# Patient Record
Sex: Female | Born: 1945
Health system: Southern US, Community
[De-identification: ages and names within clinical notes are randomized; demographics above are authoritative.]

## PROBLEM LIST (undated history)

## (undated) DIAGNOSIS — N186 End stage renal disease: Secondary | ICD-10-CM

## (undated) DIAGNOSIS — I10 Essential (primary) hypertension: Secondary | ICD-10-CM

## (undated) DIAGNOSIS — G5602 Carpal tunnel syndrome, left upper limb: Secondary | ICD-10-CM

## (undated) DIAGNOSIS — I2510 Atherosclerotic heart disease of native coronary artery without angina pectoris: Secondary | ICD-10-CM

## (undated) DIAGNOSIS — I5189 Other ill-defined heart diseases: Secondary | ICD-10-CM

## (undated) DIAGNOSIS — J189 Pneumonia, unspecified organism: Secondary | ICD-10-CM

## (undated) DIAGNOSIS — I4510 Unspecified right bundle-branch block: Secondary | ICD-10-CM

## (undated) DIAGNOSIS — H269 Unspecified cataract: Secondary | ICD-10-CM

## (undated) DIAGNOSIS — D649 Anemia, unspecified: Secondary | ICD-10-CM

## (undated) DIAGNOSIS — I272 Pulmonary hypertension, unspecified: Secondary | ICD-10-CM

## (undated) DIAGNOSIS — Z992 Dependence on renal dialysis: Secondary | ICD-10-CM

## (undated) DIAGNOSIS — G629 Polyneuropathy, unspecified: Secondary | ICD-10-CM

## (undated) DIAGNOSIS — R053 Chronic cough: Secondary | ICD-10-CM

## (undated) DIAGNOSIS — R05 Cough: Secondary | ICD-10-CM

## (undated) DIAGNOSIS — E119 Type 2 diabetes mellitus without complications: Secondary | ICD-10-CM

## (undated) DIAGNOSIS — E785 Hyperlipidemia, unspecified: Secondary | ICD-10-CM

## (undated) DIAGNOSIS — I739 Peripheral vascular disease, unspecified: Secondary | ICD-10-CM

## (undated) DIAGNOSIS — R011 Cardiac murmur, unspecified: Secondary | ICD-10-CM

## (undated) DIAGNOSIS — K219 Gastro-esophageal reflux disease without esophagitis: Secondary | ICD-10-CM

## (undated) DIAGNOSIS — H409 Unspecified glaucoma: Secondary | ICD-10-CM

## (undated) DIAGNOSIS — N2581 Secondary hyperparathyroidism of renal origin: Secondary | ICD-10-CM

## (undated) DIAGNOSIS — M1990 Unspecified osteoarthritis, unspecified site: Secondary | ICD-10-CM

## (undated) DIAGNOSIS — Z972 Presence of dental prosthetic device (complete) (partial): Secondary | ICD-10-CM

## (undated) DIAGNOSIS — N189 Chronic kidney disease, unspecified: Secondary | ICD-10-CM

## (undated) DIAGNOSIS — I440 Atrioventricular block, first degree: Secondary | ICD-10-CM

## (undated) HISTORY — PX: OTHER SURGICAL HISTORY: SHX169

## (undated) HISTORY — PX: CORONARY ARTERY BYPASS GRAFT: SHX141

## (undated) HISTORY — PX: ABDOMINAL HYSTERECTOMY: SHX81

## (undated) HISTORY — PX: NEPHRECTOMY: SHX65

## (undated) HISTORY — PX: COLONOSCOPY: SHX174

---

## 2001-04-01 DIAGNOSIS — N186 End stage renal disease: Secondary | ICD-10-CM | POA: Insufficient documentation

## 2001-04-01 DIAGNOSIS — E119 Type 2 diabetes mellitus without complications: Secondary | ICD-10-CM | POA: Insufficient documentation

## 2004-09-19 ENCOUNTER — Ambulatory Visit: Payer: Self-pay | Admitting: Nephrology

## 2004-09-25 ENCOUNTER — Ambulatory Visit: Payer: Self-pay | Admitting: Nephrology

## 2005-09-03 ENCOUNTER — Ambulatory Visit: Payer: Self-pay | Admitting: Nephrology

## 2006-05-06 HISTORY — PX: CORONARY ARTERY BYPASS GRAFT: SHX141

## 2006-08-17 ENCOUNTER — Emergency Department: Payer: Self-pay | Admitting: Emergency Medicine

## 2006-08-17 ENCOUNTER — Other Ambulatory Visit: Payer: Self-pay

## 2006-11-03 ENCOUNTER — Ambulatory Visit: Payer: Self-pay

## 2007-05-27 ENCOUNTER — Ambulatory Visit: Payer: Self-pay | Admitting: Nephrology

## 2008-01-07 ENCOUNTER — Ambulatory Visit: Payer: Self-pay | Admitting: Nephrology

## 2008-01-23 ENCOUNTER — Emergency Department: Payer: Self-pay | Admitting: Emergency Medicine

## 2008-01-28 ENCOUNTER — Emergency Department: Payer: Self-pay | Admitting: Emergency Medicine

## 2008-07-25 ENCOUNTER — Emergency Department: Payer: Self-pay | Admitting: Emergency Medicine

## 2008-07-27 ENCOUNTER — Ambulatory Visit: Payer: Self-pay | Admitting: Internal Medicine

## 2008-10-08 ENCOUNTER — Ambulatory Visit: Payer: Self-pay

## 2008-12-22 ENCOUNTER — Ambulatory Visit: Payer: Self-pay | Admitting: Nephrology

## 2009-08-01 ENCOUNTER — Ambulatory Visit: Payer: Self-pay | Admitting: Nephrology

## 2009-09-18 ENCOUNTER — Ambulatory Visit: Payer: Self-pay | Admitting: Unknown Physician Specialty

## 2010-01-05 ENCOUNTER — Emergency Department: Payer: Self-pay | Admitting: Emergency Medicine

## 2010-03-26 ENCOUNTER — Encounter: Payer: Self-pay | Admitting: Orthopedic Surgery

## 2010-04-05 ENCOUNTER — Encounter: Payer: Self-pay | Admitting: Orthopedic Surgery

## 2010-08-13 ENCOUNTER — Ambulatory Visit: Payer: Self-pay

## 2011-05-09 DIAGNOSIS — E119 Type 2 diabetes mellitus without complications: Secondary | ICD-10-CM | POA: Diagnosis not present

## 2011-05-09 DIAGNOSIS — D631 Anemia in chronic kidney disease: Secondary | ICD-10-CM | POA: Diagnosis not present

## 2011-05-09 DIAGNOSIS — N039 Chronic nephritic syndrome with unspecified morphologic changes: Secondary | ICD-10-CM | POA: Diagnosis not present

## 2011-05-09 DIAGNOSIS — Z992 Dependence on renal dialysis: Secondary | ICD-10-CM | POA: Diagnosis not present

## 2011-05-09 DIAGNOSIS — N186 End stage renal disease: Secondary | ICD-10-CM | POA: Diagnosis not present

## 2011-05-09 DIAGNOSIS — N2581 Secondary hyperparathyroidism of renal origin: Secondary | ICD-10-CM | POA: Diagnosis not present

## 2011-05-11 DIAGNOSIS — D631 Anemia in chronic kidney disease: Secondary | ICD-10-CM | POA: Diagnosis not present

## 2011-05-11 DIAGNOSIS — E119 Type 2 diabetes mellitus without complications: Secondary | ICD-10-CM | POA: Diagnosis not present

## 2011-05-11 DIAGNOSIS — Z992 Dependence on renal dialysis: Secondary | ICD-10-CM | POA: Diagnosis not present

## 2011-05-11 DIAGNOSIS — N186 End stage renal disease: Secondary | ICD-10-CM | POA: Diagnosis not present

## 2011-05-11 DIAGNOSIS — N2581 Secondary hyperparathyroidism of renal origin: Secondary | ICD-10-CM | POA: Diagnosis not present

## 2011-05-14 DIAGNOSIS — N039 Chronic nephritic syndrome with unspecified morphologic changes: Secondary | ICD-10-CM | POA: Diagnosis not present

## 2011-05-14 DIAGNOSIS — Z992 Dependence on renal dialysis: Secondary | ICD-10-CM | POA: Diagnosis not present

## 2011-05-14 DIAGNOSIS — N186 End stage renal disease: Secondary | ICD-10-CM | POA: Diagnosis not present

## 2011-05-14 DIAGNOSIS — N2581 Secondary hyperparathyroidism of renal origin: Secondary | ICD-10-CM | POA: Diagnosis not present

## 2011-05-14 DIAGNOSIS — E119 Type 2 diabetes mellitus without complications: Secondary | ICD-10-CM | POA: Diagnosis not present

## 2011-05-16 DIAGNOSIS — N2581 Secondary hyperparathyroidism of renal origin: Secondary | ICD-10-CM | POA: Diagnosis not present

## 2011-05-16 DIAGNOSIS — D631 Anemia in chronic kidney disease: Secondary | ICD-10-CM | POA: Diagnosis not present

## 2011-05-16 DIAGNOSIS — E119 Type 2 diabetes mellitus without complications: Secondary | ICD-10-CM | POA: Diagnosis not present

## 2011-05-16 DIAGNOSIS — Z992 Dependence on renal dialysis: Secondary | ICD-10-CM | POA: Diagnosis not present

## 2011-05-16 DIAGNOSIS — N186 End stage renal disease: Secondary | ICD-10-CM | POA: Diagnosis not present

## 2011-05-18 DIAGNOSIS — Z992 Dependence on renal dialysis: Secondary | ICD-10-CM | POA: Diagnosis not present

## 2011-05-18 DIAGNOSIS — N2581 Secondary hyperparathyroidism of renal origin: Secondary | ICD-10-CM | POA: Diagnosis not present

## 2011-05-18 DIAGNOSIS — D631 Anemia in chronic kidney disease: Secondary | ICD-10-CM | POA: Diagnosis not present

## 2011-05-18 DIAGNOSIS — N039 Chronic nephritic syndrome with unspecified morphologic changes: Secondary | ICD-10-CM | POA: Diagnosis not present

## 2011-05-18 DIAGNOSIS — N186 End stage renal disease: Secondary | ICD-10-CM | POA: Diagnosis not present

## 2011-05-18 DIAGNOSIS — E119 Type 2 diabetes mellitus without complications: Secondary | ICD-10-CM | POA: Diagnosis not present

## 2011-05-21 DIAGNOSIS — Z992 Dependence on renal dialysis: Secondary | ICD-10-CM | POA: Diagnosis not present

## 2011-05-21 DIAGNOSIS — E119 Type 2 diabetes mellitus without complications: Secondary | ICD-10-CM | POA: Diagnosis not present

## 2011-05-21 DIAGNOSIS — D631 Anemia in chronic kidney disease: Secondary | ICD-10-CM | POA: Diagnosis not present

## 2011-05-21 DIAGNOSIS — N2581 Secondary hyperparathyroidism of renal origin: Secondary | ICD-10-CM | POA: Diagnosis not present

## 2011-05-21 DIAGNOSIS — N186 End stage renal disease: Secondary | ICD-10-CM | POA: Diagnosis not present

## 2011-05-23 DIAGNOSIS — N186 End stage renal disease: Secondary | ICD-10-CM | POA: Diagnosis not present

## 2011-05-23 DIAGNOSIS — N2581 Secondary hyperparathyroidism of renal origin: Secondary | ICD-10-CM | POA: Diagnosis not present

## 2011-05-23 DIAGNOSIS — Z992 Dependence on renal dialysis: Secondary | ICD-10-CM | POA: Diagnosis not present

## 2011-05-23 DIAGNOSIS — E119 Type 2 diabetes mellitus without complications: Secondary | ICD-10-CM | POA: Diagnosis not present

## 2011-05-23 DIAGNOSIS — D631 Anemia in chronic kidney disease: Secondary | ICD-10-CM | POA: Diagnosis not present

## 2011-05-25 DIAGNOSIS — E119 Type 2 diabetes mellitus without complications: Secondary | ICD-10-CM | POA: Diagnosis not present

## 2011-05-25 DIAGNOSIS — D631 Anemia in chronic kidney disease: Secondary | ICD-10-CM | POA: Diagnosis not present

## 2011-05-25 DIAGNOSIS — N186 End stage renal disease: Secondary | ICD-10-CM | POA: Diagnosis not present

## 2011-05-25 DIAGNOSIS — N2581 Secondary hyperparathyroidism of renal origin: Secondary | ICD-10-CM | POA: Diagnosis not present

## 2011-05-25 DIAGNOSIS — Z992 Dependence on renal dialysis: Secondary | ICD-10-CM | POA: Diagnosis not present

## 2011-05-28 DIAGNOSIS — N039 Chronic nephritic syndrome with unspecified morphologic changes: Secondary | ICD-10-CM | POA: Diagnosis not present

## 2011-05-28 DIAGNOSIS — Z992 Dependence on renal dialysis: Secondary | ICD-10-CM | POA: Diagnosis not present

## 2011-05-28 DIAGNOSIS — E119 Type 2 diabetes mellitus without complications: Secondary | ICD-10-CM | POA: Diagnosis not present

## 2011-05-28 DIAGNOSIS — D631 Anemia in chronic kidney disease: Secondary | ICD-10-CM | POA: Diagnosis not present

## 2011-05-28 DIAGNOSIS — N186 End stage renal disease: Secondary | ICD-10-CM | POA: Diagnosis not present

## 2011-05-28 DIAGNOSIS — N2581 Secondary hyperparathyroidism of renal origin: Secondary | ICD-10-CM | POA: Diagnosis not present

## 2011-05-30 DIAGNOSIS — N2581 Secondary hyperparathyroidism of renal origin: Secondary | ICD-10-CM | POA: Diagnosis not present

## 2011-05-30 DIAGNOSIS — N186 End stage renal disease: Secondary | ICD-10-CM | POA: Diagnosis not present

## 2011-05-30 DIAGNOSIS — Z992 Dependence on renal dialysis: Secondary | ICD-10-CM | POA: Diagnosis not present

## 2011-05-30 DIAGNOSIS — E119 Type 2 diabetes mellitus without complications: Secondary | ICD-10-CM | POA: Diagnosis not present

## 2011-05-30 DIAGNOSIS — N039 Chronic nephritic syndrome with unspecified morphologic changes: Secondary | ICD-10-CM | POA: Diagnosis not present

## 2011-06-01 DIAGNOSIS — N2581 Secondary hyperparathyroidism of renal origin: Secondary | ICD-10-CM | POA: Diagnosis not present

## 2011-06-01 DIAGNOSIS — E119 Type 2 diabetes mellitus without complications: Secondary | ICD-10-CM | POA: Diagnosis not present

## 2011-06-01 DIAGNOSIS — N039 Chronic nephritic syndrome with unspecified morphologic changes: Secondary | ICD-10-CM | POA: Diagnosis not present

## 2011-06-01 DIAGNOSIS — N186 End stage renal disease: Secondary | ICD-10-CM | POA: Diagnosis not present

## 2011-06-01 DIAGNOSIS — Z992 Dependence on renal dialysis: Secondary | ICD-10-CM | POA: Diagnosis not present

## 2011-06-04 DIAGNOSIS — N2581 Secondary hyperparathyroidism of renal origin: Secondary | ICD-10-CM | POA: Diagnosis not present

## 2011-06-04 DIAGNOSIS — N186 End stage renal disease: Secondary | ICD-10-CM | POA: Diagnosis not present

## 2011-06-04 DIAGNOSIS — E119 Type 2 diabetes mellitus without complications: Secondary | ICD-10-CM | POA: Diagnosis not present

## 2011-06-04 DIAGNOSIS — Z992 Dependence on renal dialysis: Secondary | ICD-10-CM | POA: Diagnosis not present

## 2011-06-04 DIAGNOSIS — N039 Chronic nephritic syndrome with unspecified morphologic changes: Secondary | ICD-10-CM | POA: Diagnosis not present

## 2011-06-06 DIAGNOSIS — E119 Type 2 diabetes mellitus without complications: Secondary | ICD-10-CM | POA: Diagnosis not present

## 2011-06-06 DIAGNOSIS — N186 End stage renal disease: Secondary | ICD-10-CM | POA: Diagnosis not present

## 2011-06-06 DIAGNOSIS — D631 Anemia in chronic kidney disease: Secondary | ICD-10-CM | POA: Diagnosis not present

## 2011-06-06 DIAGNOSIS — Z992 Dependence on renal dialysis: Secondary | ICD-10-CM | POA: Diagnosis not present

## 2011-06-06 DIAGNOSIS — N2581 Secondary hyperparathyroidism of renal origin: Secondary | ICD-10-CM | POA: Diagnosis not present

## 2011-06-08 DIAGNOSIS — D631 Anemia in chronic kidney disease: Secondary | ICD-10-CM | POA: Diagnosis not present

## 2011-06-08 DIAGNOSIS — N186 End stage renal disease: Secondary | ICD-10-CM | POA: Diagnosis not present

## 2011-06-08 DIAGNOSIS — Z992 Dependence on renal dialysis: Secondary | ICD-10-CM | POA: Diagnosis not present

## 2011-06-08 DIAGNOSIS — E119 Type 2 diabetes mellitus without complications: Secondary | ICD-10-CM | POA: Diagnosis not present

## 2011-06-08 DIAGNOSIS — N2581 Secondary hyperparathyroidism of renal origin: Secondary | ICD-10-CM | POA: Diagnosis not present

## 2011-06-21 DIAGNOSIS — T82898A Other specified complication of vascular prosthetic devices, implants and grafts, initial encounter: Secondary | ICD-10-CM | POA: Diagnosis not present

## 2011-06-21 DIAGNOSIS — I871 Compression of vein: Secondary | ICD-10-CM | POA: Diagnosis not present

## 2011-07-04 DIAGNOSIS — N186 End stage renal disease: Secondary | ICD-10-CM | POA: Diagnosis not present

## 2011-07-06 DIAGNOSIS — N186 End stage renal disease: Secondary | ICD-10-CM | POA: Diagnosis not present

## 2011-07-06 DIAGNOSIS — N039 Chronic nephritic syndrome with unspecified morphologic changes: Secondary | ICD-10-CM | POA: Diagnosis not present

## 2011-07-06 DIAGNOSIS — D509 Iron deficiency anemia, unspecified: Secondary | ICD-10-CM | POA: Diagnosis not present

## 2011-07-06 DIAGNOSIS — Z992 Dependence on renal dialysis: Secondary | ICD-10-CM | POA: Diagnosis not present

## 2011-07-06 DIAGNOSIS — N2581 Secondary hyperparathyroidism of renal origin: Secondary | ICD-10-CM | POA: Diagnosis not present

## 2011-07-06 DIAGNOSIS — E119 Type 2 diabetes mellitus without complications: Secondary | ICD-10-CM | POA: Diagnosis not present

## 2011-07-06 DIAGNOSIS — D631 Anemia in chronic kidney disease: Secondary | ICD-10-CM | POA: Diagnosis not present

## 2011-07-09 DIAGNOSIS — N186 End stage renal disease: Secondary | ICD-10-CM | POA: Diagnosis not present

## 2011-07-09 DIAGNOSIS — D509 Iron deficiency anemia, unspecified: Secondary | ICD-10-CM | POA: Diagnosis not present

## 2011-07-09 DIAGNOSIS — E119 Type 2 diabetes mellitus without complications: Secondary | ICD-10-CM | POA: Diagnosis not present

## 2011-07-09 DIAGNOSIS — N039 Chronic nephritic syndrome with unspecified morphologic changes: Secondary | ICD-10-CM | POA: Diagnosis not present

## 2011-07-11 DIAGNOSIS — N186 End stage renal disease: Secondary | ICD-10-CM | POA: Diagnosis not present

## 2011-07-11 DIAGNOSIS — E119 Type 2 diabetes mellitus without complications: Secondary | ICD-10-CM | POA: Diagnosis not present

## 2011-07-11 DIAGNOSIS — N039 Chronic nephritic syndrome with unspecified morphologic changes: Secondary | ICD-10-CM | POA: Diagnosis not present

## 2011-07-11 DIAGNOSIS — D509 Iron deficiency anemia, unspecified: Secondary | ICD-10-CM | POA: Diagnosis not present

## 2011-07-13 DIAGNOSIS — D509 Iron deficiency anemia, unspecified: Secondary | ICD-10-CM | POA: Diagnosis not present

## 2011-07-13 DIAGNOSIS — N186 End stage renal disease: Secondary | ICD-10-CM | POA: Diagnosis not present

## 2011-07-13 DIAGNOSIS — E119 Type 2 diabetes mellitus without complications: Secondary | ICD-10-CM | POA: Diagnosis not present

## 2011-07-13 DIAGNOSIS — D631 Anemia in chronic kidney disease: Secondary | ICD-10-CM | POA: Diagnosis not present

## 2011-07-16 DIAGNOSIS — N039 Chronic nephritic syndrome with unspecified morphologic changes: Secondary | ICD-10-CM | POA: Diagnosis not present

## 2011-07-16 DIAGNOSIS — N186 End stage renal disease: Secondary | ICD-10-CM | POA: Diagnosis not present

## 2011-07-16 DIAGNOSIS — E119 Type 2 diabetes mellitus without complications: Secondary | ICD-10-CM | POA: Diagnosis not present

## 2011-07-16 DIAGNOSIS — D509 Iron deficiency anemia, unspecified: Secondary | ICD-10-CM | POA: Diagnosis not present

## 2011-07-18 DIAGNOSIS — D509 Iron deficiency anemia, unspecified: Secondary | ICD-10-CM | POA: Diagnosis not present

## 2011-07-18 DIAGNOSIS — E119 Type 2 diabetes mellitus without complications: Secondary | ICD-10-CM | POA: Diagnosis not present

## 2011-07-18 DIAGNOSIS — N186 End stage renal disease: Secondary | ICD-10-CM | POA: Diagnosis not present

## 2011-07-18 DIAGNOSIS — D631 Anemia in chronic kidney disease: Secondary | ICD-10-CM | POA: Diagnosis not present

## 2011-07-20 DIAGNOSIS — E119 Type 2 diabetes mellitus without complications: Secondary | ICD-10-CM | POA: Diagnosis not present

## 2011-07-20 DIAGNOSIS — D631 Anemia in chronic kidney disease: Secondary | ICD-10-CM | POA: Diagnosis not present

## 2011-07-20 DIAGNOSIS — N186 End stage renal disease: Secondary | ICD-10-CM | POA: Diagnosis not present

## 2011-07-20 DIAGNOSIS — D509 Iron deficiency anemia, unspecified: Secondary | ICD-10-CM | POA: Diagnosis not present

## 2011-07-22 DIAGNOSIS — Z1231 Encounter for screening mammogram for malignant neoplasm of breast: Secondary | ICD-10-CM | POA: Diagnosis not present

## 2011-07-23 DIAGNOSIS — N186 End stage renal disease: Secondary | ICD-10-CM | POA: Diagnosis not present

## 2011-07-23 DIAGNOSIS — D631 Anemia in chronic kidney disease: Secondary | ICD-10-CM | POA: Diagnosis not present

## 2011-07-23 DIAGNOSIS — E119 Type 2 diabetes mellitus without complications: Secondary | ICD-10-CM | POA: Diagnosis not present

## 2011-07-23 DIAGNOSIS — D509 Iron deficiency anemia, unspecified: Secondary | ICD-10-CM | POA: Diagnosis not present

## 2011-07-24 DIAGNOSIS — I2510 Atherosclerotic heart disease of native coronary artery without angina pectoris: Secondary | ICD-10-CM | POA: Diagnosis not present

## 2011-07-24 DIAGNOSIS — Z951 Presence of aortocoronary bypass graft: Secondary | ICD-10-CM | POA: Diagnosis not present

## 2011-07-24 DIAGNOSIS — Z905 Acquired absence of kidney: Secondary | ICD-10-CM | POA: Diagnosis not present

## 2011-07-24 DIAGNOSIS — Z992 Dependence on renal dialysis: Secondary | ICD-10-CM | POA: Diagnosis not present

## 2011-07-24 DIAGNOSIS — Z7902 Long term (current) use of antithrombotics/antiplatelets: Secondary | ICD-10-CM | POA: Diagnosis not present

## 2011-07-24 DIAGNOSIS — Z7982 Long term (current) use of aspirin: Secondary | ICD-10-CM | POA: Diagnosis not present

## 2011-07-24 DIAGNOSIS — E119 Type 2 diabetes mellitus without complications: Secondary | ICD-10-CM | POA: Diagnosis not present

## 2011-07-24 DIAGNOSIS — Z85528 Personal history of other malignant neoplasm of kidney: Secondary | ICD-10-CM | POA: Diagnosis not present

## 2011-07-24 DIAGNOSIS — Z79899 Other long term (current) drug therapy: Secondary | ICD-10-CM | POA: Diagnosis not present

## 2011-07-24 DIAGNOSIS — N186 End stage renal disease: Secondary | ICD-10-CM | POA: Diagnosis not present

## 2011-07-24 DIAGNOSIS — I120 Hypertensive chronic kidney disease with stage 5 chronic kidney disease or end stage renal disease: Secondary | ICD-10-CM | POA: Diagnosis not present

## 2011-07-25 DIAGNOSIS — N186 End stage renal disease: Secondary | ICD-10-CM | POA: Diagnosis not present

## 2011-07-25 DIAGNOSIS — D631 Anemia in chronic kidney disease: Secondary | ICD-10-CM | POA: Diagnosis not present

## 2011-07-25 DIAGNOSIS — E1129 Type 2 diabetes mellitus with other diabetic kidney complication: Secondary | ICD-10-CM | POA: Diagnosis not present

## 2011-07-25 DIAGNOSIS — E119 Type 2 diabetes mellitus without complications: Secondary | ICD-10-CM | POA: Diagnosis not present

## 2011-07-25 DIAGNOSIS — D509 Iron deficiency anemia, unspecified: Secondary | ICD-10-CM | POA: Diagnosis not present

## 2011-07-26 ENCOUNTER — Ambulatory Visit: Payer: Self-pay | Admitting: Vascular Surgery

## 2011-07-26 DIAGNOSIS — E785 Hyperlipidemia, unspecified: Secondary | ICD-10-CM | POA: Diagnosis not present

## 2011-07-26 DIAGNOSIS — N186 End stage renal disease: Secondary | ICD-10-CM | POA: Diagnosis not present

## 2011-07-26 DIAGNOSIS — Z7982 Long term (current) use of aspirin: Secondary | ICD-10-CM | POA: Diagnosis not present

## 2011-07-26 DIAGNOSIS — E119 Type 2 diabetes mellitus without complications: Secondary | ICD-10-CM | POA: Diagnosis not present

## 2011-07-26 DIAGNOSIS — Z7902 Long term (current) use of antithrombotics/antiplatelets: Secondary | ICD-10-CM | POA: Diagnosis not present

## 2011-07-26 DIAGNOSIS — Z9889 Other specified postprocedural states: Secondary | ICD-10-CM | POA: Diagnosis not present

## 2011-07-26 DIAGNOSIS — I10 Essential (primary) hypertension: Secondary | ICD-10-CM | POA: Diagnosis not present

## 2011-07-26 DIAGNOSIS — Z992 Dependence on renal dialysis: Secondary | ICD-10-CM | POA: Diagnosis not present

## 2011-07-26 DIAGNOSIS — Z79899 Other long term (current) drug therapy: Secondary | ICD-10-CM | POA: Diagnosis not present

## 2011-07-26 DIAGNOSIS — T82898A Other specified complication of vascular prosthetic devices, implants and grafts, initial encounter: Secondary | ICD-10-CM | POA: Diagnosis not present

## 2011-07-26 DIAGNOSIS — I120 Hypertensive chronic kidney disease with stage 5 chronic kidney disease or end stage renal disease: Secondary | ICD-10-CM | POA: Diagnosis not present

## 2011-07-27 DIAGNOSIS — N186 End stage renal disease: Secondary | ICD-10-CM | POA: Diagnosis not present

## 2011-07-27 DIAGNOSIS — N039 Chronic nephritic syndrome with unspecified morphologic changes: Secondary | ICD-10-CM | POA: Diagnosis not present

## 2011-07-27 DIAGNOSIS — E119 Type 2 diabetes mellitus without complications: Secondary | ICD-10-CM | POA: Diagnosis not present

## 2011-07-27 DIAGNOSIS — D509 Iron deficiency anemia, unspecified: Secondary | ICD-10-CM | POA: Diagnosis not present

## 2011-07-29 DIAGNOSIS — N6489 Other specified disorders of breast: Secondary | ICD-10-CM | POA: Diagnosis not present

## 2011-07-29 DIAGNOSIS — R928 Other abnormal and inconclusive findings on diagnostic imaging of breast: Secondary | ICD-10-CM | POA: Diagnosis not present

## 2011-07-30 DIAGNOSIS — D631 Anemia in chronic kidney disease: Secondary | ICD-10-CM | POA: Diagnosis not present

## 2011-07-30 DIAGNOSIS — D509 Iron deficiency anemia, unspecified: Secondary | ICD-10-CM | POA: Diagnosis not present

## 2011-07-30 DIAGNOSIS — E119 Type 2 diabetes mellitus without complications: Secondary | ICD-10-CM | POA: Diagnosis not present

## 2011-07-30 DIAGNOSIS — N186 End stage renal disease: Secondary | ICD-10-CM | POA: Diagnosis not present

## 2011-08-01 DIAGNOSIS — N186 End stage renal disease: Secondary | ICD-10-CM | POA: Diagnosis not present

## 2011-08-01 DIAGNOSIS — N039 Chronic nephritic syndrome with unspecified morphologic changes: Secondary | ICD-10-CM | POA: Diagnosis not present

## 2011-08-01 DIAGNOSIS — E119 Type 2 diabetes mellitus without complications: Secondary | ICD-10-CM | POA: Diagnosis not present

## 2011-08-01 DIAGNOSIS — D509 Iron deficiency anemia, unspecified: Secondary | ICD-10-CM | POA: Diagnosis not present

## 2011-08-03 DIAGNOSIS — D509 Iron deficiency anemia, unspecified: Secondary | ICD-10-CM | POA: Diagnosis not present

## 2011-08-03 DIAGNOSIS — N186 End stage renal disease: Secondary | ICD-10-CM | POA: Diagnosis not present

## 2011-08-03 DIAGNOSIS — E119 Type 2 diabetes mellitus without complications: Secondary | ICD-10-CM | POA: Diagnosis not present

## 2011-08-03 DIAGNOSIS — N039 Chronic nephritic syndrome with unspecified morphologic changes: Secondary | ICD-10-CM | POA: Diagnosis not present

## 2011-08-04 DIAGNOSIS — N186 End stage renal disease: Secondary | ICD-10-CM | POA: Diagnosis not present

## 2011-08-06 DIAGNOSIS — N2581 Secondary hyperparathyroidism of renal origin: Secondary | ICD-10-CM | POA: Diagnosis not present

## 2011-08-06 DIAGNOSIS — D631 Anemia in chronic kidney disease: Secondary | ICD-10-CM | POA: Diagnosis not present

## 2011-08-06 DIAGNOSIS — N186 End stage renal disease: Secondary | ICD-10-CM | POA: Diagnosis not present

## 2011-08-06 DIAGNOSIS — E119 Type 2 diabetes mellitus without complications: Secondary | ICD-10-CM | POA: Diagnosis not present

## 2011-08-06 DIAGNOSIS — Z992 Dependence on renal dialysis: Secondary | ICD-10-CM | POA: Diagnosis not present

## 2011-08-21 DIAGNOSIS — E119 Type 2 diabetes mellitus without complications: Secondary | ICD-10-CM | POA: Diagnosis not present

## 2011-08-21 DIAGNOSIS — N186 End stage renal disease: Secondary | ICD-10-CM | POA: Diagnosis not present

## 2011-08-21 DIAGNOSIS — T859XXA Unspecified complication of internal prosthetic device, implant and graft, initial encounter: Secondary | ICD-10-CM | POA: Diagnosis not present

## 2011-08-21 DIAGNOSIS — T82898A Other specified complication of vascular prosthetic devices, implants and grafts, initial encounter: Secondary | ICD-10-CM | POA: Diagnosis not present

## 2011-08-26 DIAGNOSIS — N200 Calculus of kidney: Secondary | ICD-10-CM | POA: Diagnosis not present

## 2011-08-26 DIAGNOSIS — N281 Cyst of kidney, acquired: Secondary | ICD-10-CM | POA: Diagnosis not present

## 2011-08-26 DIAGNOSIS — C649 Malignant neoplasm of unspecified kidney, except renal pelvis: Secondary | ICD-10-CM | POA: Diagnosis not present

## 2011-08-26 DIAGNOSIS — Z905 Acquired absence of kidney: Secondary | ICD-10-CM | POA: Diagnosis not present

## 2011-09-03 DIAGNOSIS — N186 End stage renal disease: Secondary | ICD-10-CM | POA: Diagnosis not present

## 2011-09-05 DIAGNOSIS — D631 Anemia in chronic kidney disease: Secondary | ICD-10-CM | POA: Diagnosis not present

## 2011-09-05 DIAGNOSIS — N186 End stage renal disease: Secondary | ICD-10-CM | POA: Diagnosis not present

## 2011-09-05 DIAGNOSIS — E119 Type 2 diabetes mellitus without complications: Secondary | ICD-10-CM | POA: Diagnosis not present

## 2011-09-05 DIAGNOSIS — Z992 Dependence on renal dialysis: Secondary | ICD-10-CM | POA: Diagnosis not present

## 2011-09-05 DIAGNOSIS — N2581 Secondary hyperparathyroidism of renal origin: Secondary | ICD-10-CM | POA: Diagnosis not present

## 2011-10-04 DIAGNOSIS — N186 End stage renal disease: Secondary | ICD-10-CM | POA: Diagnosis not present

## 2011-10-05 DIAGNOSIS — N2581 Secondary hyperparathyroidism of renal origin: Secondary | ICD-10-CM | POA: Diagnosis not present

## 2011-10-05 DIAGNOSIS — D631 Anemia in chronic kidney disease: Secondary | ICD-10-CM | POA: Diagnosis not present

## 2011-10-05 DIAGNOSIS — D509 Iron deficiency anemia, unspecified: Secondary | ICD-10-CM | POA: Diagnosis not present

## 2011-10-05 DIAGNOSIS — N186 End stage renal disease: Secondary | ICD-10-CM | POA: Diagnosis not present

## 2011-10-05 DIAGNOSIS — E119 Type 2 diabetes mellitus without complications: Secondary | ICD-10-CM | POA: Diagnosis not present

## 2011-10-05 DIAGNOSIS — Z992 Dependence on renal dialysis: Secondary | ICD-10-CM | POA: Diagnosis not present

## 2011-10-08 DIAGNOSIS — E119 Type 2 diabetes mellitus without complications: Secondary | ICD-10-CM | POA: Diagnosis not present

## 2011-10-08 DIAGNOSIS — N186 End stage renal disease: Secondary | ICD-10-CM | POA: Diagnosis not present

## 2011-10-08 DIAGNOSIS — D631 Anemia in chronic kidney disease: Secondary | ICD-10-CM | POA: Diagnosis not present

## 2011-10-08 DIAGNOSIS — N2581 Secondary hyperparathyroidism of renal origin: Secondary | ICD-10-CM | POA: Diagnosis not present

## 2011-10-08 DIAGNOSIS — D509 Iron deficiency anemia, unspecified: Secondary | ICD-10-CM | POA: Diagnosis not present

## 2011-10-10 DIAGNOSIS — N186 End stage renal disease: Secondary | ICD-10-CM | POA: Diagnosis not present

## 2011-10-10 DIAGNOSIS — N2581 Secondary hyperparathyroidism of renal origin: Secondary | ICD-10-CM | POA: Diagnosis not present

## 2011-10-10 DIAGNOSIS — N039 Chronic nephritic syndrome with unspecified morphologic changes: Secondary | ICD-10-CM | POA: Diagnosis not present

## 2011-10-10 DIAGNOSIS — E119 Type 2 diabetes mellitus without complications: Secondary | ICD-10-CM | POA: Diagnosis not present

## 2011-10-10 DIAGNOSIS — D509 Iron deficiency anemia, unspecified: Secondary | ICD-10-CM | POA: Diagnosis not present

## 2011-10-12 DIAGNOSIS — D509 Iron deficiency anemia, unspecified: Secondary | ICD-10-CM | POA: Diagnosis not present

## 2011-10-12 DIAGNOSIS — D631 Anemia in chronic kidney disease: Secondary | ICD-10-CM | POA: Diagnosis not present

## 2011-10-12 DIAGNOSIS — N186 End stage renal disease: Secondary | ICD-10-CM | POA: Diagnosis not present

## 2011-10-12 DIAGNOSIS — N039 Chronic nephritic syndrome with unspecified morphologic changes: Secondary | ICD-10-CM | POA: Diagnosis not present

## 2011-10-12 DIAGNOSIS — E119 Type 2 diabetes mellitus without complications: Secondary | ICD-10-CM | POA: Diagnosis not present

## 2011-10-12 DIAGNOSIS — N2581 Secondary hyperparathyroidism of renal origin: Secondary | ICD-10-CM | POA: Diagnosis not present

## 2011-10-15 DIAGNOSIS — D631 Anemia in chronic kidney disease: Secondary | ICD-10-CM | POA: Diagnosis not present

## 2011-10-15 DIAGNOSIS — N039 Chronic nephritic syndrome with unspecified morphologic changes: Secondary | ICD-10-CM | POA: Diagnosis not present

## 2011-10-15 DIAGNOSIS — E119 Type 2 diabetes mellitus without complications: Secondary | ICD-10-CM | POA: Diagnosis not present

## 2011-10-15 DIAGNOSIS — N186 End stage renal disease: Secondary | ICD-10-CM | POA: Diagnosis not present

## 2011-10-15 DIAGNOSIS — D509 Iron deficiency anemia, unspecified: Secondary | ICD-10-CM | POA: Diagnosis not present

## 2011-10-15 DIAGNOSIS — N2581 Secondary hyperparathyroidism of renal origin: Secondary | ICD-10-CM | POA: Diagnosis not present

## 2011-10-17 DIAGNOSIS — N2581 Secondary hyperparathyroidism of renal origin: Secondary | ICD-10-CM | POA: Diagnosis not present

## 2011-10-17 DIAGNOSIS — D509 Iron deficiency anemia, unspecified: Secondary | ICD-10-CM | POA: Diagnosis not present

## 2011-10-17 DIAGNOSIS — E119 Type 2 diabetes mellitus without complications: Secondary | ICD-10-CM | POA: Diagnosis not present

## 2011-10-17 DIAGNOSIS — N186 End stage renal disease: Secondary | ICD-10-CM | POA: Diagnosis not present

## 2011-10-17 DIAGNOSIS — D631 Anemia in chronic kidney disease: Secondary | ICD-10-CM | POA: Diagnosis not present

## 2011-10-19 DIAGNOSIS — N2581 Secondary hyperparathyroidism of renal origin: Secondary | ICD-10-CM | POA: Diagnosis not present

## 2011-10-19 DIAGNOSIS — D631 Anemia in chronic kidney disease: Secondary | ICD-10-CM | POA: Diagnosis not present

## 2011-10-19 DIAGNOSIS — E119 Type 2 diabetes mellitus without complications: Secondary | ICD-10-CM | POA: Diagnosis not present

## 2011-10-19 DIAGNOSIS — D509 Iron deficiency anemia, unspecified: Secondary | ICD-10-CM | POA: Diagnosis not present

## 2011-10-19 DIAGNOSIS — N186 End stage renal disease: Secondary | ICD-10-CM | POA: Diagnosis not present

## 2011-10-22 DIAGNOSIS — N186 End stage renal disease: Secondary | ICD-10-CM | POA: Diagnosis not present

## 2011-10-22 DIAGNOSIS — N039 Chronic nephritic syndrome with unspecified morphologic changes: Secondary | ICD-10-CM | POA: Diagnosis not present

## 2011-10-22 DIAGNOSIS — E119 Type 2 diabetes mellitus without complications: Secondary | ICD-10-CM | POA: Diagnosis not present

## 2011-10-22 DIAGNOSIS — D631 Anemia in chronic kidney disease: Secondary | ICD-10-CM | POA: Diagnosis not present

## 2011-10-22 DIAGNOSIS — N2581 Secondary hyperparathyroidism of renal origin: Secondary | ICD-10-CM | POA: Diagnosis not present

## 2011-10-22 DIAGNOSIS — D509 Iron deficiency anemia, unspecified: Secondary | ICD-10-CM | POA: Diagnosis not present

## 2011-10-24 DIAGNOSIS — E119 Type 2 diabetes mellitus without complications: Secondary | ICD-10-CM | POA: Diagnosis not present

## 2011-10-24 DIAGNOSIS — N186 End stage renal disease: Secondary | ICD-10-CM | POA: Diagnosis not present

## 2011-10-24 DIAGNOSIS — D631 Anemia in chronic kidney disease: Secondary | ICD-10-CM | POA: Diagnosis not present

## 2011-10-24 DIAGNOSIS — D509 Iron deficiency anemia, unspecified: Secondary | ICD-10-CM | POA: Diagnosis not present

## 2011-10-24 DIAGNOSIS — N2581 Secondary hyperparathyroidism of renal origin: Secondary | ICD-10-CM | POA: Diagnosis not present

## 2011-10-24 DIAGNOSIS — E1129 Type 2 diabetes mellitus with other diabetic kidney complication: Secondary | ICD-10-CM | POA: Diagnosis not present

## 2011-10-26 DIAGNOSIS — E119 Type 2 diabetes mellitus without complications: Secondary | ICD-10-CM | POA: Diagnosis not present

## 2011-10-26 DIAGNOSIS — N039 Chronic nephritic syndrome with unspecified morphologic changes: Secondary | ICD-10-CM | POA: Diagnosis not present

## 2011-10-26 DIAGNOSIS — N186 End stage renal disease: Secondary | ICD-10-CM | POA: Diagnosis not present

## 2011-10-26 DIAGNOSIS — N2581 Secondary hyperparathyroidism of renal origin: Secondary | ICD-10-CM | POA: Diagnosis not present

## 2011-10-26 DIAGNOSIS — D509 Iron deficiency anemia, unspecified: Secondary | ICD-10-CM | POA: Diagnosis not present

## 2011-10-29 DIAGNOSIS — N186 End stage renal disease: Secondary | ICD-10-CM | POA: Diagnosis not present

## 2011-10-29 DIAGNOSIS — E119 Type 2 diabetes mellitus without complications: Secondary | ICD-10-CM | POA: Diagnosis not present

## 2011-10-29 DIAGNOSIS — N2581 Secondary hyperparathyroidism of renal origin: Secondary | ICD-10-CM | POA: Diagnosis not present

## 2011-10-29 DIAGNOSIS — D631 Anemia in chronic kidney disease: Secondary | ICD-10-CM | POA: Diagnosis not present

## 2011-10-29 DIAGNOSIS — D509 Iron deficiency anemia, unspecified: Secondary | ICD-10-CM | POA: Diagnosis not present

## 2011-10-31 DIAGNOSIS — D509 Iron deficiency anemia, unspecified: Secondary | ICD-10-CM | POA: Diagnosis not present

## 2011-10-31 DIAGNOSIS — E119 Type 2 diabetes mellitus without complications: Secondary | ICD-10-CM | POA: Diagnosis not present

## 2011-10-31 DIAGNOSIS — D631 Anemia in chronic kidney disease: Secondary | ICD-10-CM | POA: Diagnosis not present

## 2011-10-31 DIAGNOSIS — N186 End stage renal disease: Secondary | ICD-10-CM | POA: Diagnosis not present

## 2011-10-31 DIAGNOSIS — N2581 Secondary hyperparathyroidism of renal origin: Secondary | ICD-10-CM | POA: Diagnosis not present

## 2011-11-02 DIAGNOSIS — D509 Iron deficiency anemia, unspecified: Secondary | ICD-10-CM | POA: Diagnosis not present

## 2011-11-02 DIAGNOSIS — N186 End stage renal disease: Secondary | ICD-10-CM | POA: Diagnosis not present

## 2011-11-02 DIAGNOSIS — N2581 Secondary hyperparathyroidism of renal origin: Secondary | ICD-10-CM | POA: Diagnosis not present

## 2011-11-02 DIAGNOSIS — E119 Type 2 diabetes mellitus without complications: Secondary | ICD-10-CM | POA: Diagnosis not present

## 2011-11-02 DIAGNOSIS — D631 Anemia in chronic kidney disease: Secondary | ICD-10-CM | POA: Diagnosis not present

## 2011-11-03 DIAGNOSIS — N186 End stage renal disease: Secondary | ICD-10-CM | POA: Diagnosis not present

## 2011-11-05 DIAGNOSIS — N186 End stage renal disease: Secondary | ICD-10-CM | POA: Diagnosis not present

## 2011-11-05 DIAGNOSIS — N2581 Secondary hyperparathyroidism of renal origin: Secondary | ICD-10-CM | POA: Diagnosis not present

## 2011-11-05 DIAGNOSIS — Z992 Dependence on renal dialysis: Secondary | ICD-10-CM | POA: Diagnosis not present

## 2011-11-05 DIAGNOSIS — D631 Anemia in chronic kidney disease: Secondary | ICD-10-CM | POA: Diagnosis not present

## 2011-11-05 DIAGNOSIS — E119 Type 2 diabetes mellitus without complications: Secondary | ICD-10-CM | POA: Diagnosis not present

## 2011-11-27 DIAGNOSIS — N186 End stage renal disease: Secondary | ICD-10-CM | POA: Diagnosis not present

## 2011-11-27 DIAGNOSIS — T82898A Other specified complication of vascular prosthetic devices, implants and grafts, initial encounter: Secondary | ICD-10-CM | POA: Diagnosis not present

## 2011-11-27 DIAGNOSIS — E119 Type 2 diabetes mellitus without complications: Secondary | ICD-10-CM | POA: Diagnosis not present

## 2011-12-04 DIAGNOSIS — N186 End stage renal disease: Secondary | ICD-10-CM | POA: Diagnosis not present

## 2011-12-05 DIAGNOSIS — Z992 Dependence on renal dialysis: Secondary | ICD-10-CM | POA: Diagnosis not present

## 2011-12-05 DIAGNOSIS — E119 Type 2 diabetes mellitus without complications: Secondary | ICD-10-CM | POA: Diagnosis not present

## 2011-12-05 DIAGNOSIS — D509 Iron deficiency anemia, unspecified: Secondary | ICD-10-CM | POA: Diagnosis not present

## 2011-12-05 DIAGNOSIS — D631 Anemia in chronic kidney disease: Secondary | ICD-10-CM | POA: Diagnosis not present

## 2011-12-05 DIAGNOSIS — N186 End stage renal disease: Secondary | ICD-10-CM | POA: Diagnosis not present

## 2011-12-05 DIAGNOSIS — N039 Chronic nephritic syndrome with unspecified morphologic changes: Secondary | ICD-10-CM | POA: Diagnosis not present

## 2011-12-05 DIAGNOSIS — N2581 Secondary hyperparathyroidism of renal origin: Secondary | ICD-10-CM | POA: Diagnosis not present

## 2011-12-07 DIAGNOSIS — Z992 Dependence on renal dialysis: Secondary | ICD-10-CM | POA: Diagnosis not present

## 2011-12-07 DIAGNOSIS — D509 Iron deficiency anemia, unspecified: Secondary | ICD-10-CM | POA: Diagnosis not present

## 2011-12-07 DIAGNOSIS — E119 Type 2 diabetes mellitus without complications: Secondary | ICD-10-CM | POA: Diagnosis not present

## 2011-12-07 DIAGNOSIS — D631 Anemia in chronic kidney disease: Secondary | ICD-10-CM | POA: Diagnosis not present

## 2011-12-07 DIAGNOSIS — N186 End stage renal disease: Secondary | ICD-10-CM | POA: Diagnosis not present

## 2011-12-07 DIAGNOSIS — N2581 Secondary hyperparathyroidism of renal origin: Secondary | ICD-10-CM | POA: Diagnosis not present

## 2011-12-10 DIAGNOSIS — Z992 Dependence on renal dialysis: Secondary | ICD-10-CM | POA: Diagnosis not present

## 2011-12-10 DIAGNOSIS — E119 Type 2 diabetes mellitus without complications: Secondary | ICD-10-CM | POA: Diagnosis not present

## 2011-12-10 DIAGNOSIS — N2581 Secondary hyperparathyroidism of renal origin: Secondary | ICD-10-CM | POA: Diagnosis not present

## 2011-12-10 DIAGNOSIS — N186 End stage renal disease: Secondary | ICD-10-CM | POA: Diagnosis not present

## 2011-12-10 DIAGNOSIS — D631 Anemia in chronic kidney disease: Secondary | ICD-10-CM | POA: Diagnosis not present

## 2011-12-10 DIAGNOSIS — D509 Iron deficiency anemia, unspecified: Secondary | ICD-10-CM | POA: Diagnosis not present

## 2011-12-12 DIAGNOSIS — Z992 Dependence on renal dialysis: Secondary | ICD-10-CM | POA: Diagnosis not present

## 2011-12-12 DIAGNOSIS — D509 Iron deficiency anemia, unspecified: Secondary | ICD-10-CM | POA: Diagnosis not present

## 2011-12-12 DIAGNOSIS — N2581 Secondary hyperparathyroidism of renal origin: Secondary | ICD-10-CM | POA: Diagnosis not present

## 2011-12-12 DIAGNOSIS — N186 End stage renal disease: Secondary | ICD-10-CM | POA: Diagnosis not present

## 2011-12-12 DIAGNOSIS — D631 Anemia in chronic kidney disease: Secondary | ICD-10-CM | POA: Diagnosis not present

## 2011-12-12 DIAGNOSIS — E119 Type 2 diabetes mellitus without complications: Secondary | ICD-10-CM | POA: Diagnosis not present

## 2011-12-14 DIAGNOSIS — Z992 Dependence on renal dialysis: Secondary | ICD-10-CM | POA: Diagnosis not present

## 2011-12-14 DIAGNOSIS — D631 Anemia in chronic kidney disease: Secondary | ICD-10-CM | POA: Diagnosis not present

## 2011-12-14 DIAGNOSIS — N2581 Secondary hyperparathyroidism of renal origin: Secondary | ICD-10-CM | POA: Diagnosis not present

## 2011-12-14 DIAGNOSIS — E119 Type 2 diabetes mellitus without complications: Secondary | ICD-10-CM | POA: Diagnosis not present

## 2011-12-14 DIAGNOSIS — N186 End stage renal disease: Secondary | ICD-10-CM | POA: Diagnosis not present

## 2011-12-14 DIAGNOSIS — D509 Iron deficiency anemia, unspecified: Secondary | ICD-10-CM | POA: Diagnosis not present

## 2011-12-17 DIAGNOSIS — D509 Iron deficiency anemia, unspecified: Secondary | ICD-10-CM | POA: Diagnosis not present

## 2011-12-17 DIAGNOSIS — N2581 Secondary hyperparathyroidism of renal origin: Secondary | ICD-10-CM | POA: Diagnosis not present

## 2011-12-17 DIAGNOSIS — D631 Anemia in chronic kidney disease: Secondary | ICD-10-CM | POA: Diagnosis not present

## 2011-12-17 DIAGNOSIS — E119 Type 2 diabetes mellitus without complications: Secondary | ICD-10-CM | POA: Diagnosis not present

## 2011-12-17 DIAGNOSIS — Z992 Dependence on renal dialysis: Secondary | ICD-10-CM | POA: Diagnosis not present

## 2011-12-17 DIAGNOSIS — N186 End stage renal disease: Secondary | ICD-10-CM | POA: Diagnosis not present

## 2011-12-19 DIAGNOSIS — D631 Anemia in chronic kidney disease: Secondary | ICD-10-CM | POA: Diagnosis not present

## 2011-12-19 DIAGNOSIS — N2581 Secondary hyperparathyroidism of renal origin: Secondary | ICD-10-CM | POA: Diagnosis not present

## 2011-12-19 DIAGNOSIS — E119 Type 2 diabetes mellitus without complications: Secondary | ICD-10-CM | POA: Diagnosis not present

## 2011-12-19 DIAGNOSIS — Z992 Dependence on renal dialysis: Secondary | ICD-10-CM | POA: Diagnosis not present

## 2011-12-19 DIAGNOSIS — D509 Iron deficiency anemia, unspecified: Secondary | ICD-10-CM | POA: Diagnosis not present

## 2011-12-19 DIAGNOSIS — N186 End stage renal disease: Secondary | ICD-10-CM | POA: Diagnosis not present

## 2011-12-21 DIAGNOSIS — Z992 Dependence on renal dialysis: Secondary | ICD-10-CM | POA: Diagnosis not present

## 2011-12-21 DIAGNOSIS — N2581 Secondary hyperparathyroidism of renal origin: Secondary | ICD-10-CM | POA: Diagnosis not present

## 2011-12-21 DIAGNOSIS — N186 End stage renal disease: Secondary | ICD-10-CM | POA: Diagnosis not present

## 2011-12-21 DIAGNOSIS — D509 Iron deficiency anemia, unspecified: Secondary | ICD-10-CM | POA: Diagnosis not present

## 2011-12-21 DIAGNOSIS — D631 Anemia in chronic kidney disease: Secondary | ICD-10-CM | POA: Diagnosis not present

## 2011-12-21 DIAGNOSIS — E119 Type 2 diabetes mellitus without complications: Secondary | ICD-10-CM | POA: Diagnosis not present

## 2011-12-23 DIAGNOSIS — T82898A Other specified complication of vascular prosthetic devices, implants and grafts, initial encounter: Secondary | ICD-10-CM | POA: Diagnosis not present

## 2011-12-23 DIAGNOSIS — N186 End stage renal disease: Secondary | ICD-10-CM | POA: Diagnosis not present

## 2011-12-23 DIAGNOSIS — E119 Type 2 diabetes mellitus without complications: Secondary | ICD-10-CM | POA: Diagnosis not present

## 2011-12-24 DIAGNOSIS — N186 End stage renal disease: Secondary | ICD-10-CM | POA: Diagnosis not present

## 2011-12-24 DIAGNOSIS — D631 Anemia in chronic kidney disease: Secondary | ICD-10-CM | POA: Diagnosis not present

## 2011-12-24 DIAGNOSIS — D509 Iron deficiency anemia, unspecified: Secondary | ICD-10-CM | POA: Diagnosis not present

## 2011-12-24 DIAGNOSIS — E119 Type 2 diabetes mellitus without complications: Secondary | ICD-10-CM | POA: Diagnosis not present

## 2011-12-24 DIAGNOSIS — N2581 Secondary hyperparathyroidism of renal origin: Secondary | ICD-10-CM | POA: Diagnosis not present

## 2011-12-24 DIAGNOSIS — Z992 Dependence on renal dialysis: Secondary | ICD-10-CM | POA: Diagnosis not present

## 2011-12-26 DIAGNOSIS — Z992 Dependence on renal dialysis: Secondary | ICD-10-CM | POA: Diagnosis not present

## 2011-12-26 DIAGNOSIS — N039 Chronic nephritic syndrome with unspecified morphologic changes: Secondary | ICD-10-CM | POA: Diagnosis not present

## 2011-12-26 DIAGNOSIS — N186 End stage renal disease: Secondary | ICD-10-CM | POA: Diagnosis not present

## 2011-12-26 DIAGNOSIS — D509 Iron deficiency anemia, unspecified: Secondary | ICD-10-CM | POA: Diagnosis not present

## 2011-12-26 DIAGNOSIS — E119 Type 2 diabetes mellitus without complications: Secondary | ICD-10-CM | POA: Diagnosis not present

## 2011-12-26 DIAGNOSIS — N2581 Secondary hyperparathyroidism of renal origin: Secondary | ICD-10-CM | POA: Diagnosis not present

## 2011-12-28 DIAGNOSIS — N186 End stage renal disease: Secondary | ICD-10-CM | POA: Diagnosis not present

## 2011-12-28 DIAGNOSIS — Z992 Dependence on renal dialysis: Secondary | ICD-10-CM | POA: Diagnosis not present

## 2011-12-28 DIAGNOSIS — D631 Anemia in chronic kidney disease: Secondary | ICD-10-CM | POA: Diagnosis not present

## 2011-12-28 DIAGNOSIS — N2581 Secondary hyperparathyroidism of renal origin: Secondary | ICD-10-CM | POA: Diagnosis not present

## 2011-12-28 DIAGNOSIS — D509 Iron deficiency anemia, unspecified: Secondary | ICD-10-CM | POA: Diagnosis not present

## 2011-12-28 DIAGNOSIS — E119 Type 2 diabetes mellitus without complications: Secondary | ICD-10-CM | POA: Diagnosis not present

## 2011-12-30 ENCOUNTER — Ambulatory Visit: Payer: Self-pay | Admitting: Vascular Surgery

## 2011-12-30 DIAGNOSIS — T82898A Other specified complication of vascular prosthetic devices, implants and grafts, initial encounter: Secondary | ICD-10-CM | POA: Diagnosis not present

## 2011-12-30 DIAGNOSIS — N186 End stage renal disease: Secondary | ICD-10-CM | POA: Diagnosis not present

## 2011-12-30 DIAGNOSIS — Z7982 Long term (current) use of aspirin: Secondary | ICD-10-CM | POA: Diagnosis not present

## 2011-12-30 DIAGNOSIS — E119 Type 2 diabetes mellitus without complications: Secondary | ICD-10-CM | POA: Diagnosis not present

## 2011-12-30 DIAGNOSIS — Z992 Dependence on renal dialysis: Secondary | ICD-10-CM | POA: Diagnosis not present

## 2011-12-30 DIAGNOSIS — Z9889 Other specified postprocedural states: Secondary | ICD-10-CM | POA: Diagnosis not present

## 2011-12-30 DIAGNOSIS — I120 Hypertensive chronic kidney disease with stage 5 chronic kidney disease or end stage renal disease: Secondary | ICD-10-CM | POA: Diagnosis not present

## 2011-12-30 DIAGNOSIS — Z79899 Other long term (current) drug therapy: Secondary | ICD-10-CM | POA: Diagnosis not present

## 2011-12-30 DIAGNOSIS — Z90710 Acquired absence of both cervix and uterus: Secondary | ICD-10-CM | POA: Diagnosis not present

## 2011-12-30 DIAGNOSIS — Z7901 Long term (current) use of anticoagulants: Secondary | ICD-10-CM | POA: Diagnosis not present

## 2011-12-30 DIAGNOSIS — I10 Essential (primary) hypertension: Secondary | ICD-10-CM | POA: Diagnosis not present

## 2011-12-31 DIAGNOSIS — Z992 Dependence on renal dialysis: Secondary | ICD-10-CM | POA: Diagnosis not present

## 2011-12-31 DIAGNOSIS — N039 Chronic nephritic syndrome with unspecified morphologic changes: Secondary | ICD-10-CM | POA: Diagnosis not present

## 2011-12-31 DIAGNOSIS — E119 Type 2 diabetes mellitus without complications: Secondary | ICD-10-CM | POA: Diagnosis not present

## 2011-12-31 DIAGNOSIS — N2581 Secondary hyperparathyroidism of renal origin: Secondary | ICD-10-CM | POA: Diagnosis not present

## 2011-12-31 DIAGNOSIS — N186 End stage renal disease: Secondary | ICD-10-CM | POA: Diagnosis not present

## 2011-12-31 DIAGNOSIS — D509 Iron deficiency anemia, unspecified: Secondary | ICD-10-CM | POA: Diagnosis not present

## 2012-01-02 DIAGNOSIS — D631 Anemia in chronic kidney disease: Secondary | ICD-10-CM | POA: Diagnosis not present

## 2012-01-02 DIAGNOSIS — E119 Type 2 diabetes mellitus without complications: Secondary | ICD-10-CM | POA: Diagnosis not present

## 2012-01-02 DIAGNOSIS — N186 End stage renal disease: Secondary | ICD-10-CM | POA: Diagnosis not present

## 2012-01-02 DIAGNOSIS — N2581 Secondary hyperparathyroidism of renal origin: Secondary | ICD-10-CM | POA: Diagnosis not present

## 2012-01-02 DIAGNOSIS — D509 Iron deficiency anemia, unspecified: Secondary | ICD-10-CM | POA: Diagnosis not present

## 2012-01-02 DIAGNOSIS — Z992 Dependence on renal dialysis: Secondary | ICD-10-CM | POA: Diagnosis not present

## 2012-01-04 DIAGNOSIS — D509 Iron deficiency anemia, unspecified: Secondary | ICD-10-CM | POA: Diagnosis not present

## 2012-01-04 DIAGNOSIS — Z992 Dependence on renal dialysis: Secondary | ICD-10-CM | POA: Diagnosis not present

## 2012-01-04 DIAGNOSIS — N186 End stage renal disease: Secondary | ICD-10-CM | POA: Diagnosis not present

## 2012-01-04 DIAGNOSIS — D631 Anemia in chronic kidney disease: Secondary | ICD-10-CM | POA: Diagnosis not present

## 2012-01-04 DIAGNOSIS — N2581 Secondary hyperparathyroidism of renal origin: Secondary | ICD-10-CM | POA: Diagnosis not present

## 2012-01-04 DIAGNOSIS — E119 Type 2 diabetes mellitus without complications: Secondary | ICD-10-CM | POA: Diagnosis not present

## 2012-01-07 DIAGNOSIS — Z23 Encounter for immunization: Secondary | ICD-10-CM | POA: Diagnosis not present

## 2012-01-07 DIAGNOSIS — D631 Anemia in chronic kidney disease: Secondary | ICD-10-CM | POA: Diagnosis not present

## 2012-01-07 DIAGNOSIS — N186 End stage renal disease: Secondary | ICD-10-CM | POA: Diagnosis not present

## 2012-01-07 DIAGNOSIS — Z992 Dependence on renal dialysis: Secondary | ICD-10-CM | POA: Diagnosis not present

## 2012-01-07 DIAGNOSIS — N2581 Secondary hyperparathyroidism of renal origin: Secondary | ICD-10-CM | POA: Diagnosis not present

## 2012-01-07 DIAGNOSIS — D509 Iron deficiency anemia, unspecified: Secondary | ICD-10-CM | POA: Diagnosis not present

## 2012-01-07 DIAGNOSIS — E119 Type 2 diabetes mellitus without complications: Secondary | ICD-10-CM | POA: Diagnosis not present

## 2012-01-09 DIAGNOSIS — D509 Iron deficiency anemia, unspecified: Secondary | ICD-10-CM | POA: Diagnosis not present

## 2012-01-09 DIAGNOSIS — E119 Type 2 diabetes mellitus without complications: Secondary | ICD-10-CM | POA: Diagnosis not present

## 2012-01-09 DIAGNOSIS — N186 End stage renal disease: Secondary | ICD-10-CM | POA: Diagnosis not present

## 2012-01-09 DIAGNOSIS — D631 Anemia in chronic kidney disease: Secondary | ICD-10-CM | POA: Diagnosis not present

## 2012-01-09 DIAGNOSIS — N039 Chronic nephritic syndrome with unspecified morphologic changes: Secondary | ICD-10-CM | POA: Diagnosis not present

## 2012-01-11 DIAGNOSIS — N039 Chronic nephritic syndrome with unspecified morphologic changes: Secondary | ICD-10-CM | POA: Diagnosis not present

## 2012-01-11 DIAGNOSIS — N186 End stage renal disease: Secondary | ICD-10-CM | POA: Diagnosis not present

## 2012-01-11 DIAGNOSIS — D509 Iron deficiency anemia, unspecified: Secondary | ICD-10-CM | POA: Diagnosis not present

## 2012-01-11 DIAGNOSIS — E119 Type 2 diabetes mellitus without complications: Secondary | ICD-10-CM | POA: Diagnosis not present

## 2012-01-14 DIAGNOSIS — D631 Anemia in chronic kidney disease: Secondary | ICD-10-CM | POA: Diagnosis not present

## 2012-01-14 DIAGNOSIS — E119 Type 2 diabetes mellitus without complications: Secondary | ICD-10-CM | POA: Diagnosis not present

## 2012-01-14 DIAGNOSIS — D509 Iron deficiency anemia, unspecified: Secondary | ICD-10-CM | POA: Diagnosis not present

## 2012-01-14 DIAGNOSIS — N186 End stage renal disease: Secondary | ICD-10-CM | POA: Diagnosis not present

## 2012-01-16 DIAGNOSIS — N039 Chronic nephritic syndrome with unspecified morphologic changes: Secondary | ICD-10-CM | POA: Diagnosis not present

## 2012-01-16 DIAGNOSIS — N186 End stage renal disease: Secondary | ICD-10-CM | POA: Diagnosis not present

## 2012-01-16 DIAGNOSIS — D509 Iron deficiency anemia, unspecified: Secondary | ICD-10-CM | POA: Diagnosis not present

## 2012-01-16 DIAGNOSIS — E119 Type 2 diabetes mellitus without complications: Secondary | ICD-10-CM | POA: Diagnosis not present

## 2012-01-18 DIAGNOSIS — N186 End stage renal disease: Secondary | ICD-10-CM | POA: Diagnosis not present

## 2012-01-18 DIAGNOSIS — E119 Type 2 diabetes mellitus without complications: Secondary | ICD-10-CM | POA: Diagnosis not present

## 2012-01-18 DIAGNOSIS — D631 Anemia in chronic kidney disease: Secondary | ICD-10-CM | POA: Diagnosis not present

## 2012-01-18 DIAGNOSIS — D509 Iron deficiency anemia, unspecified: Secondary | ICD-10-CM | POA: Diagnosis not present

## 2012-01-21 DIAGNOSIS — N186 End stage renal disease: Secondary | ICD-10-CM | POA: Diagnosis not present

## 2012-01-21 DIAGNOSIS — E119 Type 2 diabetes mellitus without complications: Secondary | ICD-10-CM | POA: Diagnosis not present

## 2012-01-21 DIAGNOSIS — D509 Iron deficiency anemia, unspecified: Secondary | ICD-10-CM | POA: Diagnosis not present

## 2012-01-21 DIAGNOSIS — D631 Anemia in chronic kidney disease: Secondary | ICD-10-CM | POA: Diagnosis not present

## 2012-01-23 DIAGNOSIS — D631 Anemia in chronic kidney disease: Secondary | ICD-10-CM | POA: Diagnosis not present

## 2012-01-23 DIAGNOSIS — E119 Type 2 diabetes mellitus without complications: Secondary | ICD-10-CM | POA: Diagnosis not present

## 2012-01-23 DIAGNOSIS — N186 End stage renal disease: Secondary | ICD-10-CM | POA: Diagnosis not present

## 2012-01-23 DIAGNOSIS — D509 Iron deficiency anemia, unspecified: Secondary | ICD-10-CM | POA: Diagnosis not present

## 2012-01-23 DIAGNOSIS — E1129 Type 2 diabetes mellitus with other diabetic kidney complication: Secondary | ICD-10-CM | POA: Diagnosis not present

## 2012-01-25 DIAGNOSIS — E119 Type 2 diabetes mellitus without complications: Secondary | ICD-10-CM | POA: Diagnosis not present

## 2012-01-25 DIAGNOSIS — D509 Iron deficiency anemia, unspecified: Secondary | ICD-10-CM | POA: Diagnosis not present

## 2012-01-25 DIAGNOSIS — D631 Anemia in chronic kidney disease: Secondary | ICD-10-CM | POA: Diagnosis not present

## 2012-01-25 DIAGNOSIS — N186 End stage renal disease: Secondary | ICD-10-CM | POA: Diagnosis not present

## 2012-01-28 DIAGNOSIS — D631 Anemia in chronic kidney disease: Secondary | ICD-10-CM | POA: Diagnosis not present

## 2012-01-28 DIAGNOSIS — E119 Type 2 diabetes mellitus without complications: Secondary | ICD-10-CM | POA: Diagnosis not present

## 2012-01-28 DIAGNOSIS — D509 Iron deficiency anemia, unspecified: Secondary | ICD-10-CM | POA: Diagnosis not present

## 2012-01-28 DIAGNOSIS — N186 End stage renal disease: Secondary | ICD-10-CM | POA: Diagnosis not present

## 2012-01-30 DIAGNOSIS — D509 Iron deficiency anemia, unspecified: Secondary | ICD-10-CM | POA: Diagnosis not present

## 2012-01-30 DIAGNOSIS — E119 Type 2 diabetes mellitus without complications: Secondary | ICD-10-CM | POA: Diagnosis not present

## 2012-01-30 DIAGNOSIS — N186 End stage renal disease: Secondary | ICD-10-CM | POA: Diagnosis not present

## 2012-01-30 DIAGNOSIS — N039 Chronic nephritic syndrome with unspecified morphologic changes: Secondary | ICD-10-CM | POA: Diagnosis not present

## 2012-02-01 DIAGNOSIS — N186 End stage renal disease: Secondary | ICD-10-CM | POA: Diagnosis not present

## 2012-02-01 DIAGNOSIS — N039 Chronic nephritic syndrome with unspecified morphologic changes: Secondary | ICD-10-CM | POA: Diagnosis not present

## 2012-02-01 DIAGNOSIS — E119 Type 2 diabetes mellitus without complications: Secondary | ICD-10-CM | POA: Diagnosis not present

## 2012-02-01 DIAGNOSIS — D509 Iron deficiency anemia, unspecified: Secondary | ICD-10-CM | POA: Diagnosis not present

## 2012-02-03 DIAGNOSIS — T82898A Other specified complication of vascular prosthetic devices, implants and grafts, initial encounter: Secondary | ICD-10-CM | POA: Diagnosis not present

## 2012-02-03 DIAGNOSIS — N186 End stage renal disease: Secondary | ICD-10-CM | POA: Diagnosis not present

## 2012-02-04 DIAGNOSIS — N2581 Secondary hyperparathyroidism of renal origin: Secondary | ICD-10-CM | POA: Diagnosis not present

## 2012-02-04 DIAGNOSIS — N039 Chronic nephritic syndrome with unspecified morphologic changes: Secondary | ICD-10-CM | POA: Diagnosis not present

## 2012-02-04 DIAGNOSIS — E119 Type 2 diabetes mellitus without complications: Secondary | ICD-10-CM | POA: Diagnosis not present

## 2012-02-04 DIAGNOSIS — Z992 Dependence on renal dialysis: Secondary | ICD-10-CM | POA: Diagnosis not present

## 2012-02-04 DIAGNOSIS — N186 End stage renal disease: Secondary | ICD-10-CM | POA: Diagnosis not present

## 2012-02-04 DIAGNOSIS — D631 Anemia in chronic kidney disease: Secondary | ICD-10-CM | POA: Diagnosis not present

## 2012-02-06 DIAGNOSIS — Z992 Dependence on renal dialysis: Secondary | ICD-10-CM | POA: Diagnosis not present

## 2012-02-06 DIAGNOSIS — N2581 Secondary hyperparathyroidism of renal origin: Secondary | ICD-10-CM | POA: Diagnosis not present

## 2012-02-06 DIAGNOSIS — E119 Type 2 diabetes mellitus without complications: Secondary | ICD-10-CM | POA: Diagnosis not present

## 2012-02-06 DIAGNOSIS — N186 End stage renal disease: Secondary | ICD-10-CM | POA: Diagnosis not present

## 2012-02-06 DIAGNOSIS — N039 Chronic nephritic syndrome with unspecified morphologic changes: Secondary | ICD-10-CM | POA: Diagnosis not present

## 2012-02-08 DIAGNOSIS — N186 End stage renal disease: Secondary | ICD-10-CM | POA: Diagnosis not present

## 2012-02-08 DIAGNOSIS — N039 Chronic nephritic syndrome with unspecified morphologic changes: Secondary | ICD-10-CM | POA: Diagnosis not present

## 2012-02-08 DIAGNOSIS — N2581 Secondary hyperparathyroidism of renal origin: Secondary | ICD-10-CM | POA: Diagnosis not present

## 2012-02-08 DIAGNOSIS — E119 Type 2 diabetes mellitus without complications: Secondary | ICD-10-CM | POA: Diagnosis not present

## 2012-02-08 DIAGNOSIS — Z992 Dependence on renal dialysis: Secondary | ICD-10-CM | POA: Diagnosis not present

## 2012-02-11 DIAGNOSIS — N2581 Secondary hyperparathyroidism of renal origin: Secondary | ICD-10-CM | POA: Diagnosis not present

## 2012-02-11 DIAGNOSIS — N186 End stage renal disease: Secondary | ICD-10-CM | POA: Diagnosis not present

## 2012-02-11 DIAGNOSIS — Z992 Dependence on renal dialysis: Secondary | ICD-10-CM | POA: Diagnosis not present

## 2012-02-11 DIAGNOSIS — D631 Anemia in chronic kidney disease: Secondary | ICD-10-CM | POA: Diagnosis not present

## 2012-02-11 DIAGNOSIS — E119 Type 2 diabetes mellitus without complications: Secondary | ICD-10-CM | POA: Diagnosis not present

## 2012-02-13 DIAGNOSIS — E119 Type 2 diabetes mellitus without complications: Secondary | ICD-10-CM | POA: Diagnosis not present

## 2012-02-13 DIAGNOSIS — N186 End stage renal disease: Secondary | ICD-10-CM | POA: Diagnosis not present

## 2012-02-13 DIAGNOSIS — D631 Anemia in chronic kidney disease: Secondary | ICD-10-CM | POA: Diagnosis not present

## 2012-02-13 DIAGNOSIS — Z992 Dependence on renal dialysis: Secondary | ICD-10-CM | POA: Diagnosis not present

## 2012-02-13 DIAGNOSIS — N2581 Secondary hyperparathyroidism of renal origin: Secondary | ICD-10-CM | POA: Diagnosis not present

## 2012-02-13 DIAGNOSIS — N039 Chronic nephritic syndrome with unspecified morphologic changes: Secondary | ICD-10-CM | POA: Diagnosis not present

## 2012-02-15 DIAGNOSIS — E119 Type 2 diabetes mellitus without complications: Secondary | ICD-10-CM | POA: Diagnosis not present

## 2012-02-15 DIAGNOSIS — Z992 Dependence on renal dialysis: Secondary | ICD-10-CM | POA: Diagnosis not present

## 2012-02-15 DIAGNOSIS — N2581 Secondary hyperparathyroidism of renal origin: Secondary | ICD-10-CM | POA: Diagnosis not present

## 2012-02-15 DIAGNOSIS — N186 End stage renal disease: Secondary | ICD-10-CM | POA: Diagnosis not present

## 2012-02-15 DIAGNOSIS — D631 Anemia in chronic kidney disease: Secondary | ICD-10-CM | POA: Diagnosis not present

## 2012-02-18 DIAGNOSIS — Z992 Dependence on renal dialysis: Secondary | ICD-10-CM | POA: Diagnosis not present

## 2012-02-18 DIAGNOSIS — N2581 Secondary hyperparathyroidism of renal origin: Secondary | ICD-10-CM | POA: Diagnosis not present

## 2012-02-18 DIAGNOSIS — E119 Type 2 diabetes mellitus without complications: Secondary | ICD-10-CM | POA: Diagnosis not present

## 2012-02-18 DIAGNOSIS — N186 End stage renal disease: Secondary | ICD-10-CM | POA: Diagnosis not present

## 2012-02-18 DIAGNOSIS — N039 Chronic nephritic syndrome with unspecified morphologic changes: Secondary | ICD-10-CM | POA: Diagnosis not present

## 2012-02-20 DIAGNOSIS — Z992 Dependence on renal dialysis: Secondary | ICD-10-CM | POA: Diagnosis not present

## 2012-02-20 DIAGNOSIS — N186 End stage renal disease: Secondary | ICD-10-CM | POA: Diagnosis not present

## 2012-02-20 DIAGNOSIS — E119 Type 2 diabetes mellitus without complications: Secondary | ICD-10-CM | POA: Diagnosis not present

## 2012-02-20 DIAGNOSIS — N2581 Secondary hyperparathyroidism of renal origin: Secondary | ICD-10-CM | POA: Diagnosis not present

## 2012-02-20 DIAGNOSIS — N039 Chronic nephritic syndrome with unspecified morphologic changes: Secondary | ICD-10-CM | POA: Diagnosis not present

## 2012-02-22 DIAGNOSIS — E119 Type 2 diabetes mellitus without complications: Secondary | ICD-10-CM | POA: Diagnosis not present

## 2012-02-22 DIAGNOSIS — D631 Anemia in chronic kidney disease: Secondary | ICD-10-CM | POA: Diagnosis not present

## 2012-02-22 DIAGNOSIS — N2581 Secondary hyperparathyroidism of renal origin: Secondary | ICD-10-CM | POA: Diagnosis not present

## 2012-02-22 DIAGNOSIS — N186 End stage renal disease: Secondary | ICD-10-CM | POA: Diagnosis not present

## 2012-02-22 DIAGNOSIS — Z992 Dependence on renal dialysis: Secondary | ICD-10-CM | POA: Diagnosis not present

## 2012-02-25 DIAGNOSIS — E119 Type 2 diabetes mellitus without complications: Secondary | ICD-10-CM | POA: Diagnosis not present

## 2012-02-25 DIAGNOSIS — D631 Anemia in chronic kidney disease: Secondary | ICD-10-CM | POA: Diagnosis not present

## 2012-02-25 DIAGNOSIS — N186 End stage renal disease: Secondary | ICD-10-CM | POA: Diagnosis not present

## 2012-02-25 DIAGNOSIS — N2581 Secondary hyperparathyroidism of renal origin: Secondary | ICD-10-CM | POA: Diagnosis not present

## 2012-02-25 DIAGNOSIS — Z992 Dependence on renal dialysis: Secondary | ICD-10-CM | POA: Diagnosis not present

## 2012-02-27 DIAGNOSIS — D631 Anemia in chronic kidney disease: Secondary | ICD-10-CM | POA: Diagnosis not present

## 2012-02-27 DIAGNOSIS — E119 Type 2 diabetes mellitus without complications: Secondary | ICD-10-CM | POA: Diagnosis not present

## 2012-02-27 DIAGNOSIS — N2581 Secondary hyperparathyroidism of renal origin: Secondary | ICD-10-CM | POA: Diagnosis not present

## 2012-02-27 DIAGNOSIS — Z992 Dependence on renal dialysis: Secondary | ICD-10-CM | POA: Diagnosis not present

## 2012-02-27 DIAGNOSIS — N186 End stage renal disease: Secondary | ICD-10-CM | POA: Diagnosis not present

## 2012-02-29 DIAGNOSIS — N186 End stage renal disease: Secondary | ICD-10-CM | POA: Diagnosis not present

## 2012-02-29 DIAGNOSIS — N039 Chronic nephritic syndrome with unspecified morphologic changes: Secondary | ICD-10-CM | POA: Diagnosis not present

## 2012-02-29 DIAGNOSIS — Z992 Dependence on renal dialysis: Secondary | ICD-10-CM | POA: Diagnosis not present

## 2012-02-29 DIAGNOSIS — E119 Type 2 diabetes mellitus without complications: Secondary | ICD-10-CM | POA: Diagnosis not present

## 2012-02-29 DIAGNOSIS — N2581 Secondary hyperparathyroidism of renal origin: Secondary | ICD-10-CM | POA: Diagnosis not present

## 2012-03-03 DIAGNOSIS — N186 End stage renal disease: Secondary | ICD-10-CM | POA: Diagnosis not present

## 2012-03-03 DIAGNOSIS — D631 Anemia in chronic kidney disease: Secondary | ICD-10-CM | POA: Diagnosis not present

## 2012-03-03 DIAGNOSIS — N2581 Secondary hyperparathyroidism of renal origin: Secondary | ICD-10-CM | POA: Diagnosis not present

## 2012-03-03 DIAGNOSIS — Z992 Dependence on renal dialysis: Secondary | ICD-10-CM | POA: Diagnosis not present

## 2012-03-03 DIAGNOSIS — E119 Type 2 diabetes mellitus without complications: Secondary | ICD-10-CM | POA: Diagnosis not present

## 2012-03-05 DIAGNOSIS — E119 Type 2 diabetes mellitus without complications: Secondary | ICD-10-CM | POA: Diagnosis not present

## 2012-03-05 DIAGNOSIS — N2581 Secondary hyperparathyroidism of renal origin: Secondary | ICD-10-CM | POA: Diagnosis not present

## 2012-03-05 DIAGNOSIS — Z992 Dependence on renal dialysis: Secondary | ICD-10-CM | POA: Diagnosis not present

## 2012-03-05 DIAGNOSIS — N186 End stage renal disease: Secondary | ICD-10-CM | POA: Diagnosis not present

## 2012-03-05 DIAGNOSIS — D631 Anemia in chronic kidney disease: Secondary | ICD-10-CM | POA: Diagnosis not present

## 2012-03-07 DIAGNOSIS — N2581 Secondary hyperparathyroidism of renal origin: Secondary | ICD-10-CM | POA: Diagnosis not present

## 2012-03-07 DIAGNOSIS — N186 End stage renal disease: Secondary | ICD-10-CM | POA: Diagnosis not present

## 2012-03-07 DIAGNOSIS — Z992 Dependence on renal dialysis: Secondary | ICD-10-CM | POA: Diagnosis not present

## 2012-03-07 DIAGNOSIS — D631 Anemia in chronic kidney disease: Secondary | ICD-10-CM | POA: Diagnosis not present

## 2012-03-07 DIAGNOSIS — E119 Type 2 diabetes mellitus without complications: Secondary | ICD-10-CM | POA: Diagnosis not present

## 2012-03-07 DIAGNOSIS — N039 Chronic nephritic syndrome with unspecified morphologic changes: Secondary | ICD-10-CM | POA: Diagnosis not present

## 2012-03-10 DIAGNOSIS — Z992 Dependence on renal dialysis: Secondary | ICD-10-CM | POA: Diagnosis not present

## 2012-03-10 DIAGNOSIS — N186 End stage renal disease: Secondary | ICD-10-CM | POA: Diagnosis not present

## 2012-03-10 DIAGNOSIS — E119 Type 2 diabetes mellitus without complications: Secondary | ICD-10-CM | POA: Diagnosis not present

## 2012-03-10 DIAGNOSIS — I4510 Unspecified right bundle-branch block: Secondary | ICD-10-CM | POA: Diagnosis not present

## 2012-03-10 DIAGNOSIS — Z79899 Other long term (current) drug therapy: Secondary | ICD-10-CM | POA: Diagnosis not present

## 2012-03-10 DIAGNOSIS — I120 Hypertensive chronic kidney disease with stage 5 chronic kidney disease or end stage renal disease: Secondary | ICD-10-CM | POA: Diagnosis not present

## 2012-03-10 DIAGNOSIS — I2510 Atherosclerotic heart disease of native coronary artery without angina pectoris: Secondary | ICD-10-CM | POA: Diagnosis not present

## 2012-03-10 DIAGNOSIS — I4949 Other premature depolarization: Secondary | ICD-10-CM | POA: Diagnosis not present

## 2012-03-10 DIAGNOSIS — N2581 Secondary hyperparathyroidism of renal origin: Secondary | ICD-10-CM | POA: Diagnosis not present

## 2012-03-10 DIAGNOSIS — N039 Chronic nephritic syndrome with unspecified morphologic changes: Secondary | ICD-10-CM | POA: Diagnosis not present

## 2012-03-10 DIAGNOSIS — K219 Gastro-esophageal reflux disease without esophagitis: Secondary | ICD-10-CM | POA: Diagnosis not present

## 2012-03-10 DIAGNOSIS — Z7902 Long term (current) use of antithrombotics/antiplatelets: Secondary | ICD-10-CM | POA: Diagnosis not present

## 2012-03-12 DIAGNOSIS — N186 End stage renal disease: Secondary | ICD-10-CM | POA: Diagnosis not present

## 2012-03-12 DIAGNOSIS — E119 Type 2 diabetes mellitus without complications: Secondary | ICD-10-CM | POA: Diagnosis not present

## 2012-03-12 DIAGNOSIS — D631 Anemia in chronic kidney disease: Secondary | ICD-10-CM | POA: Diagnosis not present

## 2012-03-12 DIAGNOSIS — N2581 Secondary hyperparathyroidism of renal origin: Secondary | ICD-10-CM | POA: Diagnosis not present

## 2012-03-12 DIAGNOSIS — Z992 Dependence on renal dialysis: Secondary | ICD-10-CM | POA: Diagnosis not present

## 2012-03-14 DIAGNOSIS — Z992 Dependence on renal dialysis: Secondary | ICD-10-CM | POA: Diagnosis not present

## 2012-03-14 DIAGNOSIS — N186 End stage renal disease: Secondary | ICD-10-CM | POA: Diagnosis not present

## 2012-03-14 DIAGNOSIS — E119 Type 2 diabetes mellitus without complications: Secondary | ICD-10-CM | POA: Diagnosis not present

## 2012-03-14 DIAGNOSIS — N2581 Secondary hyperparathyroidism of renal origin: Secondary | ICD-10-CM | POA: Diagnosis not present

## 2012-03-14 DIAGNOSIS — N039 Chronic nephritic syndrome with unspecified morphologic changes: Secondary | ICD-10-CM | POA: Diagnosis not present

## 2012-03-17 DIAGNOSIS — N186 End stage renal disease: Secondary | ICD-10-CM | POA: Diagnosis not present

## 2012-03-17 DIAGNOSIS — N039 Chronic nephritic syndrome with unspecified morphologic changes: Secondary | ICD-10-CM | POA: Diagnosis not present

## 2012-03-17 DIAGNOSIS — Z992 Dependence on renal dialysis: Secondary | ICD-10-CM | POA: Diagnosis not present

## 2012-03-17 DIAGNOSIS — N2581 Secondary hyperparathyroidism of renal origin: Secondary | ICD-10-CM | POA: Diagnosis not present

## 2012-03-17 DIAGNOSIS — E119 Type 2 diabetes mellitus without complications: Secondary | ICD-10-CM | POA: Diagnosis not present

## 2012-03-19 DIAGNOSIS — E119 Type 2 diabetes mellitus without complications: Secondary | ICD-10-CM | POA: Diagnosis not present

## 2012-03-19 DIAGNOSIS — N186 End stage renal disease: Secondary | ICD-10-CM | POA: Diagnosis not present

## 2012-03-19 DIAGNOSIS — D631 Anemia in chronic kidney disease: Secondary | ICD-10-CM | POA: Diagnosis not present

## 2012-03-19 DIAGNOSIS — Z992 Dependence on renal dialysis: Secondary | ICD-10-CM | POA: Diagnosis not present

## 2012-03-19 DIAGNOSIS — N2581 Secondary hyperparathyroidism of renal origin: Secondary | ICD-10-CM | POA: Diagnosis not present

## 2012-03-21 DIAGNOSIS — Z992 Dependence on renal dialysis: Secondary | ICD-10-CM | POA: Diagnosis not present

## 2012-03-21 DIAGNOSIS — N2581 Secondary hyperparathyroidism of renal origin: Secondary | ICD-10-CM | POA: Diagnosis not present

## 2012-03-21 DIAGNOSIS — D631 Anemia in chronic kidney disease: Secondary | ICD-10-CM | POA: Diagnosis not present

## 2012-03-21 DIAGNOSIS — E119 Type 2 diabetes mellitus without complications: Secondary | ICD-10-CM | POA: Diagnosis not present

## 2012-03-21 DIAGNOSIS — N186 End stage renal disease: Secondary | ICD-10-CM | POA: Diagnosis not present

## 2012-03-24 DIAGNOSIS — N2581 Secondary hyperparathyroidism of renal origin: Secondary | ICD-10-CM | POA: Diagnosis not present

## 2012-03-24 DIAGNOSIS — Z992 Dependence on renal dialysis: Secondary | ICD-10-CM | POA: Diagnosis not present

## 2012-03-24 DIAGNOSIS — N186 End stage renal disease: Secondary | ICD-10-CM | POA: Diagnosis not present

## 2012-03-24 DIAGNOSIS — E119 Type 2 diabetes mellitus without complications: Secondary | ICD-10-CM | POA: Diagnosis not present

## 2012-03-24 DIAGNOSIS — N039 Chronic nephritic syndrome with unspecified morphologic changes: Secondary | ICD-10-CM | POA: Diagnosis not present

## 2012-03-26 DIAGNOSIS — Z992 Dependence on renal dialysis: Secondary | ICD-10-CM | POA: Diagnosis not present

## 2012-03-26 DIAGNOSIS — E119 Type 2 diabetes mellitus without complications: Secondary | ICD-10-CM | POA: Diagnosis not present

## 2012-03-26 DIAGNOSIS — N039 Chronic nephritic syndrome with unspecified morphologic changes: Secondary | ICD-10-CM | POA: Diagnosis not present

## 2012-03-26 DIAGNOSIS — N2581 Secondary hyperparathyroidism of renal origin: Secondary | ICD-10-CM | POA: Diagnosis not present

## 2012-03-26 DIAGNOSIS — N186 End stage renal disease: Secondary | ICD-10-CM | POA: Diagnosis not present

## 2012-03-28 DIAGNOSIS — E119 Type 2 diabetes mellitus without complications: Secondary | ICD-10-CM | POA: Diagnosis not present

## 2012-03-28 DIAGNOSIS — N039 Chronic nephritic syndrome with unspecified morphologic changes: Secondary | ICD-10-CM | POA: Diagnosis not present

## 2012-03-28 DIAGNOSIS — Z992 Dependence on renal dialysis: Secondary | ICD-10-CM | POA: Diagnosis not present

## 2012-03-28 DIAGNOSIS — N2581 Secondary hyperparathyroidism of renal origin: Secondary | ICD-10-CM | POA: Diagnosis not present

## 2012-03-28 DIAGNOSIS — N186 End stage renal disease: Secondary | ICD-10-CM | POA: Diagnosis not present

## 2012-03-31 DIAGNOSIS — N2581 Secondary hyperparathyroidism of renal origin: Secondary | ICD-10-CM | POA: Diagnosis not present

## 2012-03-31 DIAGNOSIS — Z992 Dependence on renal dialysis: Secondary | ICD-10-CM | POA: Diagnosis not present

## 2012-03-31 DIAGNOSIS — E119 Type 2 diabetes mellitus without complications: Secondary | ICD-10-CM | POA: Diagnosis not present

## 2012-03-31 DIAGNOSIS — N186 End stage renal disease: Secondary | ICD-10-CM | POA: Diagnosis not present

## 2012-03-31 DIAGNOSIS — N039 Chronic nephritic syndrome with unspecified morphologic changes: Secondary | ICD-10-CM | POA: Diagnosis not present

## 2012-04-02 DIAGNOSIS — N2581 Secondary hyperparathyroidism of renal origin: Secondary | ICD-10-CM | POA: Diagnosis not present

## 2012-04-02 DIAGNOSIS — N186 End stage renal disease: Secondary | ICD-10-CM | POA: Diagnosis not present

## 2012-04-02 DIAGNOSIS — D631 Anemia in chronic kidney disease: Secondary | ICD-10-CM | POA: Diagnosis not present

## 2012-04-02 DIAGNOSIS — Z992 Dependence on renal dialysis: Secondary | ICD-10-CM | POA: Diagnosis not present

## 2012-04-02 DIAGNOSIS — E119 Type 2 diabetes mellitus without complications: Secondary | ICD-10-CM | POA: Diagnosis not present

## 2012-04-04 DIAGNOSIS — D631 Anemia in chronic kidney disease: Secondary | ICD-10-CM | POA: Diagnosis not present

## 2012-04-04 DIAGNOSIS — E119 Type 2 diabetes mellitus without complications: Secondary | ICD-10-CM | POA: Diagnosis not present

## 2012-04-04 DIAGNOSIS — N186 End stage renal disease: Secondary | ICD-10-CM | POA: Diagnosis not present

## 2012-04-04 DIAGNOSIS — N039 Chronic nephritic syndrome with unspecified morphologic changes: Secondary | ICD-10-CM | POA: Diagnosis not present

## 2012-04-04 DIAGNOSIS — Z992 Dependence on renal dialysis: Secondary | ICD-10-CM | POA: Diagnosis not present

## 2012-04-04 DIAGNOSIS — N2581 Secondary hyperparathyroidism of renal origin: Secondary | ICD-10-CM | POA: Diagnosis not present

## 2012-04-07 DIAGNOSIS — N186 End stage renal disease: Secondary | ICD-10-CM | POA: Diagnosis not present

## 2012-04-07 DIAGNOSIS — Z992 Dependence on renal dialysis: Secondary | ICD-10-CM | POA: Diagnosis not present

## 2012-04-07 DIAGNOSIS — D509 Iron deficiency anemia, unspecified: Secondary | ICD-10-CM | POA: Diagnosis not present

## 2012-04-07 DIAGNOSIS — E119 Type 2 diabetes mellitus without complications: Secondary | ICD-10-CM | POA: Diagnosis not present

## 2012-04-07 DIAGNOSIS — N2581 Secondary hyperparathyroidism of renal origin: Secondary | ICD-10-CM | POA: Diagnosis not present

## 2012-04-07 DIAGNOSIS — D631 Anemia in chronic kidney disease: Secondary | ICD-10-CM | POA: Diagnosis not present

## 2012-04-09 DIAGNOSIS — D631 Anemia in chronic kidney disease: Secondary | ICD-10-CM | POA: Diagnosis not present

## 2012-04-09 DIAGNOSIS — E119 Type 2 diabetes mellitus without complications: Secondary | ICD-10-CM | POA: Diagnosis not present

## 2012-04-09 DIAGNOSIS — N2581 Secondary hyperparathyroidism of renal origin: Secondary | ICD-10-CM | POA: Diagnosis not present

## 2012-04-09 DIAGNOSIS — D509 Iron deficiency anemia, unspecified: Secondary | ICD-10-CM | POA: Diagnosis not present

## 2012-04-09 DIAGNOSIS — N186 End stage renal disease: Secondary | ICD-10-CM | POA: Diagnosis not present

## 2012-04-11 DIAGNOSIS — N039 Chronic nephritic syndrome with unspecified morphologic changes: Secondary | ICD-10-CM | POA: Diagnosis not present

## 2012-04-11 DIAGNOSIS — E119 Type 2 diabetes mellitus without complications: Secondary | ICD-10-CM | POA: Diagnosis not present

## 2012-04-11 DIAGNOSIS — D509 Iron deficiency anemia, unspecified: Secondary | ICD-10-CM | POA: Diagnosis not present

## 2012-04-11 DIAGNOSIS — N186 End stage renal disease: Secondary | ICD-10-CM | POA: Diagnosis not present

## 2012-04-11 DIAGNOSIS — N2581 Secondary hyperparathyroidism of renal origin: Secondary | ICD-10-CM | POA: Diagnosis not present

## 2012-04-14 DIAGNOSIS — N2581 Secondary hyperparathyroidism of renal origin: Secondary | ICD-10-CM | POA: Diagnosis not present

## 2012-04-14 DIAGNOSIS — E119 Type 2 diabetes mellitus without complications: Secondary | ICD-10-CM | POA: Diagnosis not present

## 2012-04-14 DIAGNOSIS — D631 Anemia in chronic kidney disease: Secondary | ICD-10-CM | POA: Diagnosis not present

## 2012-04-14 DIAGNOSIS — D509 Iron deficiency anemia, unspecified: Secondary | ICD-10-CM | POA: Diagnosis not present

## 2012-04-14 DIAGNOSIS — N186 End stage renal disease: Secondary | ICD-10-CM | POA: Diagnosis not present

## 2012-04-16 DIAGNOSIS — N186 End stage renal disease: Secondary | ICD-10-CM | POA: Diagnosis not present

## 2012-04-16 DIAGNOSIS — N2581 Secondary hyperparathyroidism of renal origin: Secondary | ICD-10-CM | POA: Diagnosis not present

## 2012-04-16 DIAGNOSIS — E119 Type 2 diabetes mellitus without complications: Secondary | ICD-10-CM | POA: Diagnosis not present

## 2012-04-16 DIAGNOSIS — D509 Iron deficiency anemia, unspecified: Secondary | ICD-10-CM | POA: Diagnosis not present

## 2012-04-16 DIAGNOSIS — D631 Anemia in chronic kidney disease: Secondary | ICD-10-CM | POA: Diagnosis not present

## 2012-04-18 DIAGNOSIS — D509 Iron deficiency anemia, unspecified: Secondary | ICD-10-CM | POA: Diagnosis not present

## 2012-04-18 DIAGNOSIS — N186 End stage renal disease: Secondary | ICD-10-CM | POA: Diagnosis not present

## 2012-04-18 DIAGNOSIS — N2581 Secondary hyperparathyroidism of renal origin: Secondary | ICD-10-CM | POA: Diagnosis not present

## 2012-04-18 DIAGNOSIS — E119 Type 2 diabetes mellitus without complications: Secondary | ICD-10-CM | POA: Diagnosis not present

## 2012-04-18 DIAGNOSIS — N039 Chronic nephritic syndrome with unspecified morphologic changes: Secondary | ICD-10-CM | POA: Diagnosis not present

## 2012-04-21 DIAGNOSIS — D509 Iron deficiency anemia, unspecified: Secondary | ICD-10-CM | POA: Diagnosis not present

## 2012-04-21 DIAGNOSIS — N186 End stage renal disease: Secondary | ICD-10-CM | POA: Diagnosis not present

## 2012-04-21 DIAGNOSIS — D631 Anemia in chronic kidney disease: Secondary | ICD-10-CM | POA: Diagnosis not present

## 2012-04-21 DIAGNOSIS — E119 Type 2 diabetes mellitus without complications: Secondary | ICD-10-CM | POA: Diagnosis not present

## 2012-04-21 DIAGNOSIS — N2581 Secondary hyperparathyroidism of renal origin: Secondary | ICD-10-CM | POA: Diagnosis not present

## 2012-04-23 DIAGNOSIS — E119 Type 2 diabetes mellitus without complications: Secondary | ICD-10-CM | POA: Diagnosis not present

## 2012-04-23 DIAGNOSIS — D509 Iron deficiency anemia, unspecified: Secondary | ICD-10-CM | POA: Diagnosis not present

## 2012-04-23 DIAGNOSIS — N2581 Secondary hyperparathyroidism of renal origin: Secondary | ICD-10-CM | POA: Diagnosis not present

## 2012-04-23 DIAGNOSIS — E1129 Type 2 diabetes mellitus with other diabetic kidney complication: Secondary | ICD-10-CM | POA: Diagnosis not present

## 2012-04-23 DIAGNOSIS — N039 Chronic nephritic syndrome with unspecified morphologic changes: Secondary | ICD-10-CM | POA: Diagnosis not present

## 2012-04-23 DIAGNOSIS — N186 End stage renal disease: Secondary | ICD-10-CM | POA: Diagnosis not present

## 2012-04-25 DIAGNOSIS — N2581 Secondary hyperparathyroidism of renal origin: Secondary | ICD-10-CM | POA: Diagnosis not present

## 2012-04-25 DIAGNOSIS — D509 Iron deficiency anemia, unspecified: Secondary | ICD-10-CM | POA: Diagnosis not present

## 2012-04-25 DIAGNOSIS — N039 Chronic nephritic syndrome with unspecified morphologic changes: Secondary | ICD-10-CM | POA: Diagnosis not present

## 2012-04-25 DIAGNOSIS — N186 End stage renal disease: Secondary | ICD-10-CM | POA: Diagnosis not present

## 2012-04-25 DIAGNOSIS — E119 Type 2 diabetes mellitus without complications: Secondary | ICD-10-CM | POA: Diagnosis not present

## 2012-04-27 DIAGNOSIS — I10 Essential (primary) hypertension: Secondary | ICD-10-CM | POA: Diagnosis not present

## 2012-04-27 DIAGNOSIS — E119 Type 2 diabetes mellitus without complications: Secondary | ICD-10-CM | POA: Diagnosis not present

## 2012-04-27 DIAGNOSIS — N186 End stage renal disease: Secondary | ICD-10-CM | POA: Diagnosis not present

## 2012-04-27 DIAGNOSIS — T859XXA Unspecified complication of internal prosthetic device, implant and graft, initial encounter: Secondary | ICD-10-CM | POA: Diagnosis not present

## 2012-04-28 DIAGNOSIS — N2581 Secondary hyperparathyroidism of renal origin: Secondary | ICD-10-CM | POA: Diagnosis not present

## 2012-04-28 DIAGNOSIS — E119 Type 2 diabetes mellitus without complications: Secondary | ICD-10-CM | POA: Diagnosis not present

## 2012-04-28 DIAGNOSIS — D631 Anemia in chronic kidney disease: Secondary | ICD-10-CM | POA: Diagnosis not present

## 2012-04-28 DIAGNOSIS — D509 Iron deficiency anemia, unspecified: Secondary | ICD-10-CM | POA: Diagnosis not present

## 2012-04-28 DIAGNOSIS — N186 End stage renal disease: Secondary | ICD-10-CM | POA: Diagnosis not present

## 2012-04-30 DIAGNOSIS — D509 Iron deficiency anemia, unspecified: Secondary | ICD-10-CM | POA: Diagnosis not present

## 2012-04-30 DIAGNOSIS — E119 Type 2 diabetes mellitus without complications: Secondary | ICD-10-CM | POA: Diagnosis not present

## 2012-04-30 DIAGNOSIS — N2581 Secondary hyperparathyroidism of renal origin: Secondary | ICD-10-CM | POA: Diagnosis not present

## 2012-04-30 DIAGNOSIS — N186 End stage renal disease: Secondary | ICD-10-CM | POA: Diagnosis not present

## 2012-04-30 DIAGNOSIS — N039 Chronic nephritic syndrome with unspecified morphologic changes: Secondary | ICD-10-CM | POA: Diagnosis not present

## 2012-04-30 DIAGNOSIS — D631 Anemia in chronic kidney disease: Secondary | ICD-10-CM | POA: Diagnosis not present

## 2012-05-02 DIAGNOSIS — D631 Anemia in chronic kidney disease: Secondary | ICD-10-CM | POA: Diagnosis not present

## 2012-05-02 DIAGNOSIS — E119 Type 2 diabetes mellitus without complications: Secondary | ICD-10-CM | POA: Diagnosis not present

## 2012-05-02 DIAGNOSIS — N2581 Secondary hyperparathyroidism of renal origin: Secondary | ICD-10-CM | POA: Diagnosis not present

## 2012-05-02 DIAGNOSIS — N186 End stage renal disease: Secondary | ICD-10-CM | POA: Diagnosis not present

## 2012-05-02 DIAGNOSIS — D509 Iron deficiency anemia, unspecified: Secondary | ICD-10-CM | POA: Diagnosis not present

## 2012-05-04 DIAGNOSIS — D509 Iron deficiency anemia, unspecified: Secondary | ICD-10-CM | POA: Diagnosis not present

## 2012-05-04 DIAGNOSIS — E119 Type 2 diabetes mellitus without complications: Secondary | ICD-10-CM | POA: Diagnosis not present

## 2012-05-04 DIAGNOSIS — N2581 Secondary hyperparathyroidism of renal origin: Secondary | ICD-10-CM | POA: Diagnosis not present

## 2012-05-04 DIAGNOSIS — N186 End stage renal disease: Secondary | ICD-10-CM | POA: Diagnosis not present

## 2012-05-04 DIAGNOSIS — D631 Anemia in chronic kidney disease: Secondary | ICD-10-CM | POA: Diagnosis not present

## 2012-05-05 DIAGNOSIS — N186 End stage renal disease: Secondary | ICD-10-CM | POA: Diagnosis not present

## 2012-05-07 DIAGNOSIS — E119 Type 2 diabetes mellitus without complications: Secondary | ICD-10-CM | POA: Diagnosis not present

## 2012-05-07 DIAGNOSIS — D631 Anemia in chronic kidney disease: Secondary | ICD-10-CM | POA: Diagnosis not present

## 2012-05-07 DIAGNOSIS — N2581 Secondary hyperparathyroidism of renal origin: Secondary | ICD-10-CM | POA: Diagnosis not present

## 2012-05-07 DIAGNOSIS — N186 End stage renal disease: Secondary | ICD-10-CM | POA: Diagnosis not present

## 2012-05-07 DIAGNOSIS — Z992 Dependence on renal dialysis: Secondary | ICD-10-CM | POA: Diagnosis not present

## 2012-05-11 ENCOUNTER — Ambulatory Visit: Payer: Self-pay | Admitting: Vascular Surgery

## 2012-05-11 DIAGNOSIS — Z7982 Long term (current) use of aspirin: Secondary | ICD-10-CM | POA: Diagnosis not present

## 2012-05-11 DIAGNOSIS — Z992 Dependence on renal dialysis: Secondary | ICD-10-CM | POA: Diagnosis not present

## 2012-05-11 DIAGNOSIS — Z90710 Acquired absence of both cervix and uterus: Secondary | ICD-10-CM | POA: Diagnosis not present

## 2012-05-11 DIAGNOSIS — Z9889 Other specified postprocedural states: Secondary | ICD-10-CM | POA: Diagnosis not present

## 2012-05-11 DIAGNOSIS — T82898A Other specified complication of vascular prosthetic devices, implants and grafts, initial encounter: Secondary | ICD-10-CM | POA: Diagnosis not present

## 2012-05-11 DIAGNOSIS — Z79899 Other long term (current) drug therapy: Secondary | ICD-10-CM | POA: Diagnosis not present

## 2012-05-11 DIAGNOSIS — E669 Obesity, unspecified: Secondary | ICD-10-CM | POA: Diagnosis not present

## 2012-05-11 DIAGNOSIS — Z7902 Long term (current) use of antithrombotics/antiplatelets: Secondary | ICD-10-CM | POA: Diagnosis not present

## 2012-05-11 DIAGNOSIS — E119 Type 2 diabetes mellitus without complications: Secondary | ICD-10-CM | POA: Diagnosis not present

## 2012-05-11 DIAGNOSIS — N186 End stage renal disease: Secondary | ICD-10-CM | POA: Diagnosis not present

## 2012-05-11 DIAGNOSIS — I10 Essential (primary) hypertension: Secondary | ICD-10-CM | POA: Diagnosis not present

## 2012-05-11 DIAGNOSIS — I2510 Atherosclerotic heart disease of native coronary artery without angina pectoris: Secondary | ICD-10-CM | POA: Diagnosis not present

## 2012-05-11 DIAGNOSIS — I120 Hypertensive chronic kidney disease with stage 5 chronic kidney disease or end stage renal disease: Secondary | ICD-10-CM | POA: Diagnosis not present

## 2012-05-22 ENCOUNTER — Ambulatory Visit: Payer: Self-pay | Admitting: Vascular Surgery

## 2012-05-22 DIAGNOSIS — I10 Essential (primary) hypertension: Secondary | ICD-10-CM | POA: Diagnosis not present

## 2012-05-22 DIAGNOSIS — I120 Hypertensive chronic kidney disease with stage 5 chronic kidney disease or end stage renal disease: Secondary | ICD-10-CM | POA: Diagnosis not present

## 2012-05-22 DIAGNOSIS — N186 End stage renal disease: Secondary | ICD-10-CM | POA: Diagnosis not present

## 2012-05-22 DIAGNOSIS — E669 Obesity, unspecified: Secondary | ICD-10-CM | POA: Diagnosis not present

## 2012-05-22 DIAGNOSIS — T82898A Other specified complication of vascular prosthetic devices, implants and grafts, initial encounter: Secondary | ICD-10-CM | POA: Diagnosis not present

## 2012-05-22 DIAGNOSIS — Z992 Dependence on renal dialysis: Secondary | ICD-10-CM | POA: Diagnosis not present

## 2012-05-27 DIAGNOSIS — I10 Essential (primary) hypertension: Secondary | ICD-10-CM | POA: Diagnosis not present

## 2012-05-27 DIAGNOSIS — Z13228 Encounter for screening for other metabolic disorders: Secondary | ICD-10-CM | POA: Diagnosis not present

## 2012-05-27 DIAGNOSIS — Z1329 Encounter for screening for other suspected endocrine disorder: Secondary | ICD-10-CM | POA: Diagnosis not present

## 2012-05-27 DIAGNOSIS — A5901 Trichomonal vulvovaginitis: Secondary | ICD-10-CM | POA: Diagnosis not present

## 2012-06-02 DIAGNOSIS — E119 Type 2 diabetes mellitus without complications: Secondary | ICD-10-CM | POA: Diagnosis not present

## 2012-06-02 DIAGNOSIS — Z7982 Long term (current) use of aspirin: Secondary | ICD-10-CM | POA: Diagnosis not present

## 2012-06-02 DIAGNOSIS — I2510 Atherosclerotic heart disease of native coronary artery without angina pectoris: Secondary | ICD-10-CM | POA: Diagnosis not present

## 2012-06-02 DIAGNOSIS — Z7902 Long term (current) use of antithrombotics/antiplatelets: Secondary | ICD-10-CM | POA: Diagnosis not present

## 2012-06-02 DIAGNOSIS — I4510 Unspecified right bundle-branch block: Secondary | ICD-10-CM | POA: Diagnosis not present

## 2012-06-02 DIAGNOSIS — N186 End stage renal disease: Secondary | ICD-10-CM | POA: Diagnosis not present

## 2012-06-02 DIAGNOSIS — I120 Hypertensive chronic kidney disease with stage 5 chronic kidney disease or end stage renal disease: Secondary | ICD-10-CM | POA: Diagnosis not present

## 2012-06-02 DIAGNOSIS — Z951 Presence of aortocoronary bypass graft: Secondary | ICD-10-CM | POA: Diagnosis not present

## 2012-06-02 DIAGNOSIS — I499 Cardiac arrhythmia, unspecified: Secondary | ICD-10-CM | POA: Diagnosis not present

## 2012-06-05 DIAGNOSIS — N186 End stage renal disease: Secondary | ICD-10-CM | POA: Diagnosis not present

## 2012-06-06 DIAGNOSIS — N186 End stage renal disease: Secondary | ICD-10-CM | POA: Diagnosis not present

## 2012-06-06 DIAGNOSIS — N039 Chronic nephritic syndrome with unspecified morphologic changes: Secondary | ICD-10-CM | POA: Diagnosis not present

## 2012-06-06 DIAGNOSIS — E119 Type 2 diabetes mellitus without complications: Secondary | ICD-10-CM | POA: Diagnosis not present

## 2012-06-06 DIAGNOSIS — Z992 Dependence on renal dialysis: Secondary | ICD-10-CM | POA: Diagnosis not present

## 2012-06-06 DIAGNOSIS — D631 Anemia in chronic kidney disease: Secondary | ICD-10-CM | POA: Diagnosis not present

## 2012-06-06 DIAGNOSIS — N2581 Secondary hyperparathyroidism of renal origin: Secondary | ICD-10-CM | POA: Diagnosis not present

## 2012-06-09 DIAGNOSIS — Z992 Dependence on renal dialysis: Secondary | ICD-10-CM | POA: Diagnosis not present

## 2012-06-09 DIAGNOSIS — N186 End stage renal disease: Secondary | ICD-10-CM | POA: Diagnosis not present

## 2012-06-09 DIAGNOSIS — N2581 Secondary hyperparathyroidism of renal origin: Secondary | ICD-10-CM | POA: Diagnosis not present

## 2012-06-09 DIAGNOSIS — N039 Chronic nephritic syndrome with unspecified morphologic changes: Secondary | ICD-10-CM | POA: Diagnosis not present

## 2012-06-09 DIAGNOSIS — E119 Type 2 diabetes mellitus without complications: Secondary | ICD-10-CM | POA: Diagnosis not present

## 2012-06-10 DIAGNOSIS — N186 End stage renal disease: Secondary | ICD-10-CM | POA: Diagnosis not present

## 2012-06-10 DIAGNOSIS — E119 Type 2 diabetes mellitus without complications: Secondary | ICD-10-CM | POA: Diagnosis not present

## 2012-06-10 DIAGNOSIS — T859XXA Unspecified complication of internal prosthetic device, implant and graft, initial encounter: Secondary | ICD-10-CM | POA: Diagnosis not present

## 2012-06-10 DIAGNOSIS — I10 Essential (primary) hypertension: Secondary | ICD-10-CM | POA: Diagnosis not present

## 2012-06-11 DIAGNOSIS — N186 End stage renal disease: Secondary | ICD-10-CM | POA: Diagnosis not present

## 2012-06-11 DIAGNOSIS — N039 Chronic nephritic syndrome with unspecified morphologic changes: Secondary | ICD-10-CM | POA: Diagnosis not present

## 2012-06-11 DIAGNOSIS — Z992 Dependence on renal dialysis: Secondary | ICD-10-CM | POA: Diagnosis not present

## 2012-06-11 DIAGNOSIS — N2581 Secondary hyperparathyroidism of renal origin: Secondary | ICD-10-CM | POA: Diagnosis not present

## 2012-06-11 DIAGNOSIS — E119 Type 2 diabetes mellitus without complications: Secondary | ICD-10-CM | POA: Diagnosis not present

## 2012-06-13 DIAGNOSIS — Z992 Dependence on renal dialysis: Secondary | ICD-10-CM | POA: Diagnosis not present

## 2012-06-13 DIAGNOSIS — E119 Type 2 diabetes mellitus without complications: Secondary | ICD-10-CM | POA: Diagnosis not present

## 2012-06-13 DIAGNOSIS — N2581 Secondary hyperparathyroidism of renal origin: Secondary | ICD-10-CM | POA: Diagnosis not present

## 2012-06-13 DIAGNOSIS — N186 End stage renal disease: Secondary | ICD-10-CM | POA: Diagnosis not present

## 2012-06-13 DIAGNOSIS — D631 Anemia in chronic kidney disease: Secondary | ICD-10-CM | POA: Diagnosis not present

## 2012-06-16 DIAGNOSIS — N2581 Secondary hyperparathyroidism of renal origin: Secondary | ICD-10-CM | POA: Diagnosis not present

## 2012-06-16 DIAGNOSIS — D631 Anemia in chronic kidney disease: Secondary | ICD-10-CM | POA: Diagnosis not present

## 2012-06-16 DIAGNOSIS — Z992 Dependence on renal dialysis: Secondary | ICD-10-CM | POA: Diagnosis not present

## 2012-06-16 DIAGNOSIS — N186 End stage renal disease: Secondary | ICD-10-CM | POA: Diagnosis not present

## 2012-06-16 DIAGNOSIS — E119 Type 2 diabetes mellitus without complications: Secondary | ICD-10-CM | POA: Diagnosis not present

## 2012-06-18 DIAGNOSIS — E119 Type 2 diabetes mellitus without complications: Secondary | ICD-10-CM | POA: Diagnosis not present

## 2012-06-18 DIAGNOSIS — N039 Chronic nephritic syndrome with unspecified morphologic changes: Secondary | ICD-10-CM | POA: Diagnosis not present

## 2012-06-18 DIAGNOSIS — N2581 Secondary hyperparathyroidism of renal origin: Secondary | ICD-10-CM | POA: Diagnosis not present

## 2012-06-18 DIAGNOSIS — Z992 Dependence on renal dialysis: Secondary | ICD-10-CM | POA: Diagnosis not present

## 2012-06-18 DIAGNOSIS — N186 End stage renal disease: Secondary | ICD-10-CM | POA: Diagnosis not present

## 2012-06-20 DIAGNOSIS — Z992 Dependence on renal dialysis: Secondary | ICD-10-CM | POA: Diagnosis not present

## 2012-06-20 DIAGNOSIS — N186 End stage renal disease: Secondary | ICD-10-CM | POA: Diagnosis not present

## 2012-06-20 DIAGNOSIS — E119 Type 2 diabetes mellitus without complications: Secondary | ICD-10-CM | POA: Diagnosis not present

## 2012-06-20 DIAGNOSIS — D631 Anemia in chronic kidney disease: Secondary | ICD-10-CM | POA: Diagnosis not present

## 2012-06-20 DIAGNOSIS — N2581 Secondary hyperparathyroidism of renal origin: Secondary | ICD-10-CM | POA: Diagnosis not present

## 2012-06-23 DIAGNOSIS — D631 Anemia in chronic kidney disease: Secondary | ICD-10-CM | POA: Diagnosis not present

## 2012-06-23 DIAGNOSIS — N2581 Secondary hyperparathyroidism of renal origin: Secondary | ICD-10-CM | POA: Diagnosis not present

## 2012-06-23 DIAGNOSIS — N186 End stage renal disease: Secondary | ICD-10-CM | POA: Diagnosis not present

## 2012-06-23 DIAGNOSIS — Z992 Dependence on renal dialysis: Secondary | ICD-10-CM | POA: Diagnosis not present

## 2012-06-23 DIAGNOSIS — E119 Type 2 diabetes mellitus without complications: Secondary | ICD-10-CM | POA: Diagnosis not present

## 2012-06-25 DIAGNOSIS — N2581 Secondary hyperparathyroidism of renal origin: Secondary | ICD-10-CM | POA: Diagnosis not present

## 2012-06-25 DIAGNOSIS — N039 Chronic nephritic syndrome with unspecified morphologic changes: Secondary | ICD-10-CM | POA: Diagnosis not present

## 2012-06-25 DIAGNOSIS — E119 Type 2 diabetes mellitus without complications: Secondary | ICD-10-CM | POA: Diagnosis not present

## 2012-06-25 DIAGNOSIS — Z992 Dependence on renal dialysis: Secondary | ICD-10-CM | POA: Diagnosis not present

## 2012-06-25 DIAGNOSIS — N186 End stage renal disease: Secondary | ICD-10-CM | POA: Diagnosis not present

## 2012-06-27 DIAGNOSIS — N2581 Secondary hyperparathyroidism of renal origin: Secondary | ICD-10-CM | POA: Diagnosis not present

## 2012-06-27 DIAGNOSIS — Z992 Dependence on renal dialysis: Secondary | ICD-10-CM | POA: Diagnosis not present

## 2012-06-27 DIAGNOSIS — D631 Anemia in chronic kidney disease: Secondary | ICD-10-CM | POA: Diagnosis not present

## 2012-06-27 DIAGNOSIS — E119 Type 2 diabetes mellitus without complications: Secondary | ICD-10-CM | POA: Diagnosis not present

## 2012-06-27 DIAGNOSIS — N186 End stage renal disease: Secondary | ICD-10-CM | POA: Diagnosis not present

## 2012-06-30 DIAGNOSIS — N186 End stage renal disease: Secondary | ICD-10-CM | POA: Diagnosis not present

## 2012-06-30 DIAGNOSIS — N039 Chronic nephritic syndrome with unspecified morphologic changes: Secondary | ICD-10-CM | POA: Diagnosis not present

## 2012-06-30 DIAGNOSIS — N2581 Secondary hyperparathyroidism of renal origin: Secondary | ICD-10-CM | POA: Diagnosis not present

## 2012-06-30 DIAGNOSIS — E119 Type 2 diabetes mellitus without complications: Secondary | ICD-10-CM | POA: Diagnosis not present

## 2012-06-30 DIAGNOSIS — Z992 Dependence on renal dialysis: Secondary | ICD-10-CM | POA: Diagnosis not present

## 2012-07-02 DIAGNOSIS — N2581 Secondary hyperparathyroidism of renal origin: Secondary | ICD-10-CM | POA: Diagnosis not present

## 2012-07-02 DIAGNOSIS — Z992 Dependence on renal dialysis: Secondary | ICD-10-CM | POA: Diagnosis not present

## 2012-07-02 DIAGNOSIS — E119 Type 2 diabetes mellitus without complications: Secondary | ICD-10-CM | POA: Diagnosis not present

## 2012-07-02 DIAGNOSIS — D631 Anemia in chronic kidney disease: Secondary | ICD-10-CM | POA: Diagnosis not present

## 2012-07-02 DIAGNOSIS — N186 End stage renal disease: Secondary | ICD-10-CM | POA: Diagnosis not present

## 2012-07-03 DIAGNOSIS — N186 End stage renal disease: Secondary | ICD-10-CM | POA: Diagnosis not present

## 2012-07-04 DIAGNOSIS — N2581 Secondary hyperparathyroidism of renal origin: Secondary | ICD-10-CM | POA: Diagnosis not present

## 2012-07-04 DIAGNOSIS — N186 End stage renal disease: Secondary | ICD-10-CM | POA: Diagnosis not present

## 2012-07-04 DIAGNOSIS — D631 Anemia in chronic kidney disease: Secondary | ICD-10-CM | POA: Diagnosis not present

## 2012-07-04 DIAGNOSIS — Z992 Dependence on renal dialysis: Secondary | ICD-10-CM | POA: Diagnosis not present

## 2012-07-04 DIAGNOSIS — E119 Type 2 diabetes mellitus without complications: Secondary | ICD-10-CM | POA: Diagnosis not present

## 2012-07-04 DIAGNOSIS — D509 Iron deficiency anemia, unspecified: Secondary | ICD-10-CM | POA: Diagnosis not present

## 2012-07-07 DIAGNOSIS — N2581 Secondary hyperparathyroidism of renal origin: Secondary | ICD-10-CM | POA: Diagnosis not present

## 2012-07-07 DIAGNOSIS — N039 Chronic nephritic syndrome with unspecified morphologic changes: Secondary | ICD-10-CM | POA: Diagnosis not present

## 2012-07-07 DIAGNOSIS — D509 Iron deficiency anemia, unspecified: Secondary | ICD-10-CM | POA: Diagnosis not present

## 2012-07-07 DIAGNOSIS — N186 End stage renal disease: Secondary | ICD-10-CM | POA: Diagnosis not present

## 2012-07-07 DIAGNOSIS — E119 Type 2 diabetes mellitus without complications: Secondary | ICD-10-CM | POA: Diagnosis not present

## 2012-07-09 DIAGNOSIS — N2581 Secondary hyperparathyroidism of renal origin: Secondary | ICD-10-CM | POA: Diagnosis not present

## 2012-07-09 DIAGNOSIS — D509 Iron deficiency anemia, unspecified: Secondary | ICD-10-CM | POA: Diagnosis not present

## 2012-07-09 DIAGNOSIS — N039 Chronic nephritic syndrome with unspecified morphologic changes: Secondary | ICD-10-CM | POA: Diagnosis not present

## 2012-07-09 DIAGNOSIS — N186 End stage renal disease: Secondary | ICD-10-CM | POA: Diagnosis not present

## 2012-07-09 DIAGNOSIS — E119 Type 2 diabetes mellitus without complications: Secondary | ICD-10-CM | POA: Diagnosis not present

## 2012-07-11 DIAGNOSIS — N2581 Secondary hyperparathyroidism of renal origin: Secondary | ICD-10-CM | POA: Diagnosis not present

## 2012-07-11 DIAGNOSIS — N186 End stage renal disease: Secondary | ICD-10-CM | POA: Diagnosis not present

## 2012-07-11 DIAGNOSIS — D631 Anemia in chronic kidney disease: Secondary | ICD-10-CM | POA: Diagnosis not present

## 2012-07-11 DIAGNOSIS — D509 Iron deficiency anemia, unspecified: Secondary | ICD-10-CM | POA: Diagnosis not present

## 2012-07-11 DIAGNOSIS — E119 Type 2 diabetes mellitus without complications: Secondary | ICD-10-CM | POA: Diagnosis not present

## 2012-07-14 DIAGNOSIS — N2581 Secondary hyperparathyroidism of renal origin: Secondary | ICD-10-CM | POA: Diagnosis not present

## 2012-07-14 DIAGNOSIS — N039 Chronic nephritic syndrome with unspecified morphologic changes: Secondary | ICD-10-CM | POA: Diagnosis not present

## 2012-07-14 DIAGNOSIS — E119 Type 2 diabetes mellitus without complications: Secondary | ICD-10-CM | POA: Diagnosis not present

## 2012-07-14 DIAGNOSIS — N186 End stage renal disease: Secondary | ICD-10-CM | POA: Diagnosis not present

## 2012-07-14 DIAGNOSIS — D509 Iron deficiency anemia, unspecified: Secondary | ICD-10-CM | POA: Diagnosis not present

## 2012-07-16 DIAGNOSIS — E1129 Type 2 diabetes mellitus with other diabetic kidney complication: Secondary | ICD-10-CM | POA: Diagnosis not present

## 2012-07-16 DIAGNOSIS — D631 Anemia in chronic kidney disease: Secondary | ICD-10-CM | POA: Diagnosis not present

## 2012-07-16 DIAGNOSIS — N186 End stage renal disease: Secondary | ICD-10-CM | POA: Diagnosis not present

## 2012-07-16 DIAGNOSIS — E119 Type 2 diabetes mellitus without complications: Secondary | ICD-10-CM | POA: Diagnosis not present

## 2012-07-16 DIAGNOSIS — D509 Iron deficiency anemia, unspecified: Secondary | ICD-10-CM | POA: Diagnosis not present

## 2012-07-18 DIAGNOSIS — N186 End stage renal disease: Secondary | ICD-10-CM | POA: Diagnosis not present

## 2012-07-18 DIAGNOSIS — N2581 Secondary hyperparathyroidism of renal origin: Secondary | ICD-10-CM | POA: Diagnosis not present

## 2012-07-18 DIAGNOSIS — D509 Iron deficiency anemia, unspecified: Secondary | ICD-10-CM | POA: Diagnosis not present

## 2012-07-18 DIAGNOSIS — E119 Type 2 diabetes mellitus without complications: Secondary | ICD-10-CM | POA: Diagnosis not present

## 2012-07-18 DIAGNOSIS — D631 Anemia in chronic kidney disease: Secondary | ICD-10-CM | POA: Diagnosis not present

## 2012-07-21 DIAGNOSIS — D631 Anemia in chronic kidney disease: Secondary | ICD-10-CM | POA: Diagnosis not present

## 2012-07-21 DIAGNOSIS — D509 Iron deficiency anemia, unspecified: Secondary | ICD-10-CM | POA: Diagnosis not present

## 2012-07-21 DIAGNOSIS — N186 End stage renal disease: Secondary | ICD-10-CM | POA: Diagnosis not present

## 2012-07-21 DIAGNOSIS — E119 Type 2 diabetes mellitus without complications: Secondary | ICD-10-CM | POA: Diagnosis not present

## 2012-07-21 DIAGNOSIS — N2581 Secondary hyperparathyroidism of renal origin: Secondary | ICD-10-CM | POA: Diagnosis not present

## 2012-07-23 DIAGNOSIS — D631 Anemia in chronic kidney disease: Secondary | ICD-10-CM | POA: Diagnosis not present

## 2012-07-23 DIAGNOSIS — N2581 Secondary hyperparathyroidism of renal origin: Secondary | ICD-10-CM | POA: Diagnosis not present

## 2012-07-23 DIAGNOSIS — N186 End stage renal disease: Secondary | ICD-10-CM | POA: Diagnosis not present

## 2012-07-23 DIAGNOSIS — E119 Type 2 diabetes mellitus without complications: Secondary | ICD-10-CM | POA: Diagnosis not present

## 2012-07-23 DIAGNOSIS — D509 Iron deficiency anemia, unspecified: Secondary | ICD-10-CM | POA: Diagnosis not present

## 2012-07-25 DIAGNOSIS — N186 End stage renal disease: Secondary | ICD-10-CM | POA: Diagnosis not present

## 2012-07-25 DIAGNOSIS — D631 Anemia in chronic kidney disease: Secondary | ICD-10-CM | POA: Diagnosis not present

## 2012-07-25 DIAGNOSIS — N039 Chronic nephritic syndrome with unspecified morphologic changes: Secondary | ICD-10-CM | POA: Diagnosis not present

## 2012-07-25 DIAGNOSIS — E119 Type 2 diabetes mellitus without complications: Secondary | ICD-10-CM | POA: Diagnosis not present

## 2012-07-25 DIAGNOSIS — N2581 Secondary hyperparathyroidism of renal origin: Secondary | ICD-10-CM | POA: Diagnosis not present

## 2012-07-25 DIAGNOSIS — D509 Iron deficiency anemia, unspecified: Secondary | ICD-10-CM | POA: Diagnosis not present

## 2012-07-28 DIAGNOSIS — E119 Type 2 diabetes mellitus without complications: Secondary | ICD-10-CM | POA: Diagnosis not present

## 2012-07-28 DIAGNOSIS — N2581 Secondary hyperparathyroidism of renal origin: Secondary | ICD-10-CM | POA: Diagnosis not present

## 2012-07-28 DIAGNOSIS — N186 End stage renal disease: Secondary | ICD-10-CM | POA: Diagnosis not present

## 2012-07-28 DIAGNOSIS — D509 Iron deficiency anemia, unspecified: Secondary | ICD-10-CM | POA: Diagnosis not present

## 2012-07-28 DIAGNOSIS — N039 Chronic nephritic syndrome with unspecified morphologic changes: Secondary | ICD-10-CM | POA: Diagnosis not present

## 2012-07-28 DIAGNOSIS — D631 Anemia in chronic kidney disease: Secondary | ICD-10-CM | POA: Diagnosis not present

## 2012-07-30 DIAGNOSIS — E119 Type 2 diabetes mellitus without complications: Secondary | ICD-10-CM | POA: Diagnosis not present

## 2012-07-30 DIAGNOSIS — D631 Anemia in chronic kidney disease: Secondary | ICD-10-CM | POA: Diagnosis not present

## 2012-07-30 DIAGNOSIS — D509 Iron deficiency anemia, unspecified: Secondary | ICD-10-CM | POA: Diagnosis not present

## 2012-07-30 DIAGNOSIS — N186 End stage renal disease: Secondary | ICD-10-CM | POA: Diagnosis not present

## 2012-07-30 DIAGNOSIS — N2581 Secondary hyperparathyroidism of renal origin: Secondary | ICD-10-CM | POA: Diagnosis not present

## 2012-08-01 DIAGNOSIS — D509 Iron deficiency anemia, unspecified: Secondary | ICD-10-CM | POA: Diagnosis not present

## 2012-08-01 DIAGNOSIS — E119 Type 2 diabetes mellitus without complications: Secondary | ICD-10-CM | POA: Diagnosis not present

## 2012-08-01 DIAGNOSIS — N2581 Secondary hyperparathyroidism of renal origin: Secondary | ICD-10-CM | POA: Diagnosis not present

## 2012-08-01 DIAGNOSIS — D631 Anemia in chronic kidney disease: Secondary | ICD-10-CM | POA: Diagnosis not present

## 2012-08-01 DIAGNOSIS — N186 End stage renal disease: Secondary | ICD-10-CM | POA: Diagnosis not present

## 2012-08-03 DIAGNOSIS — N186 End stage renal disease: Secondary | ICD-10-CM | POA: Diagnosis not present

## 2012-08-04 DIAGNOSIS — D631 Anemia in chronic kidney disease: Secondary | ICD-10-CM | POA: Diagnosis not present

## 2012-08-04 DIAGNOSIS — N186 End stage renal disease: Secondary | ICD-10-CM | POA: Diagnosis not present

## 2012-08-04 DIAGNOSIS — Z992 Dependence on renal dialysis: Secondary | ICD-10-CM | POA: Diagnosis not present

## 2012-08-04 DIAGNOSIS — N2581 Secondary hyperparathyroidism of renal origin: Secondary | ICD-10-CM | POA: Diagnosis not present

## 2012-08-04 DIAGNOSIS — E119 Type 2 diabetes mellitus without complications: Secondary | ICD-10-CM | POA: Diagnosis not present

## 2012-08-24 DIAGNOSIS — C649 Malignant neoplasm of unspecified kidney, except renal pelvis: Secondary | ICD-10-CM | POA: Diagnosis not present

## 2012-08-24 DIAGNOSIS — I2510 Atherosclerotic heart disease of native coronary artery without angina pectoris: Secondary | ICD-10-CM | POA: Diagnosis not present

## 2012-08-24 DIAGNOSIS — E1129 Type 2 diabetes mellitus with other diabetic kidney complication: Secondary | ICD-10-CM | POA: Diagnosis not present

## 2012-08-24 DIAGNOSIS — Z85528 Personal history of other malignant neoplasm of kidney: Secondary | ICD-10-CM | POA: Diagnosis not present

## 2012-08-24 DIAGNOSIS — K219 Gastro-esophageal reflux disease without esophagitis: Secondary | ICD-10-CM | POA: Diagnosis not present

## 2012-08-24 DIAGNOSIS — N2889 Other specified disorders of kidney and ureter: Secondary | ICD-10-CM | POA: Diagnosis not present

## 2012-08-24 DIAGNOSIS — I120 Hypertensive chronic kidney disease with stage 5 chronic kidney disease or end stage renal disease: Secondary | ICD-10-CM | POA: Diagnosis not present

## 2012-08-24 DIAGNOSIS — N269 Renal sclerosis, unspecified: Secondary | ICD-10-CM | POA: Diagnosis not present

## 2012-08-24 DIAGNOSIS — Z905 Acquired absence of kidney: Secondary | ICD-10-CM | POA: Diagnosis not present

## 2012-08-24 DIAGNOSIS — N186 End stage renal disease: Secondary | ICD-10-CM | POA: Diagnosis not present

## 2012-08-24 DIAGNOSIS — N289 Disorder of kidney and ureter, unspecified: Secondary | ICD-10-CM | POA: Diagnosis not present

## 2012-09-02 DIAGNOSIS — N186 End stage renal disease: Secondary | ICD-10-CM | POA: Diagnosis not present

## 2012-09-03 DIAGNOSIS — D631 Anemia in chronic kidney disease: Secondary | ICD-10-CM | POA: Diagnosis not present

## 2012-09-03 DIAGNOSIS — N2581 Secondary hyperparathyroidism of renal origin: Secondary | ICD-10-CM | POA: Diagnosis not present

## 2012-09-03 DIAGNOSIS — Z992 Dependence on renal dialysis: Secondary | ICD-10-CM | POA: Diagnosis not present

## 2012-09-03 DIAGNOSIS — N186 End stage renal disease: Secondary | ICD-10-CM | POA: Diagnosis not present

## 2012-09-03 DIAGNOSIS — E119 Type 2 diabetes mellitus without complications: Secondary | ICD-10-CM | POA: Diagnosis not present

## 2012-09-04 DIAGNOSIS — I871 Compression of vein: Secondary | ICD-10-CM | POA: Diagnosis not present

## 2012-09-04 DIAGNOSIS — T82898A Other specified complication of vascular prosthetic devices, implants and grafts, initial encounter: Secondary | ICD-10-CM | POA: Diagnosis not present

## 2012-09-04 DIAGNOSIS — I771 Stricture of artery: Secondary | ICD-10-CM | POA: Diagnosis not present

## 2012-09-23 DIAGNOSIS — N186 End stage renal disease: Secondary | ICD-10-CM | POA: Diagnosis not present

## 2012-09-23 DIAGNOSIS — I10 Essential (primary) hypertension: Secondary | ICD-10-CM | POA: Diagnosis not present

## 2012-09-23 DIAGNOSIS — E119 Type 2 diabetes mellitus without complications: Secondary | ICD-10-CM | POA: Diagnosis not present

## 2012-10-03 DIAGNOSIS — N186 End stage renal disease: Secondary | ICD-10-CM | POA: Diagnosis not present

## 2012-10-06 DIAGNOSIS — D509 Iron deficiency anemia, unspecified: Secondary | ICD-10-CM | POA: Diagnosis not present

## 2012-10-06 DIAGNOSIS — D631 Anemia in chronic kidney disease: Secondary | ICD-10-CM | POA: Diagnosis not present

## 2012-10-06 DIAGNOSIS — E119 Type 2 diabetes mellitus without complications: Secondary | ICD-10-CM | POA: Diagnosis not present

## 2012-10-06 DIAGNOSIS — N2581 Secondary hyperparathyroidism of renal origin: Secondary | ICD-10-CM | POA: Diagnosis not present

## 2012-10-06 DIAGNOSIS — N186 End stage renal disease: Secondary | ICD-10-CM | POA: Diagnosis not present

## 2012-10-22 DIAGNOSIS — E1129 Type 2 diabetes mellitus with other diabetic kidney complication: Secondary | ICD-10-CM | POA: Diagnosis not present

## 2012-11-02 DIAGNOSIS — N186 End stage renal disease: Secondary | ICD-10-CM | POA: Diagnosis not present

## 2012-11-03 DIAGNOSIS — E119 Type 2 diabetes mellitus without complications: Secondary | ICD-10-CM | POA: Diagnosis not present

## 2012-11-03 DIAGNOSIS — D631 Anemia in chronic kidney disease: Secondary | ICD-10-CM | POA: Diagnosis not present

## 2012-11-03 DIAGNOSIS — N2581 Secondary hyperparathyroidism of renal origin: Secondary | ICD-10-CM | POA: Diagnosis not present

## 2012-11-03 DIAGNOSIS — N186 End stage renal disease: Secondary | ICD-10-CM | POA: Diagnosis not present

## 2012-11-10 DIAGNOSIS — I2510 Atherosclerotic heart disease of native coronary artery without angina pectoris: Secondary | ICD-10-CM | POA: Insufficient documentation

## 2012-11-10 DIAGNOSIS — Z940 Kidney transplant status: Secondary | ICD-10-CM | POA: Diagnosis not present

## 2012-11-10 DIAGNOSIS — Z79899 Other long term (current) drug therapy: Secondary | ICD-10-CM | POA: Diagnosis not present

## 2012-11-10 DIAGNOSIS — E785 Hyperlipidemia, unspecified: Secondary | ICD-10-CM | POA: Diagnosis not present

## 2012-11-10 DIAGNOSIS — Z951 Presence of aortocoronary bypass graft: Secondary | ICD-10-CM | POA: Diagnosis not present

## 2012-11-10 DIAGNOSIS — I959 Hypotension, unspecified: Secondary | ICD-10-CM | POA: Insufficient documentation

## 2012-11-10 DIAGNOSIS — I10 Essential (primary) hypertension: Secondary | ICD-10-CM | POA: Diagnosis not present

## 2012-12-03 DIAGNOSIS — N186 End stage renal disease: Secondary | ICD-10-CM | POA: Diagnosis not present

## 2012-12-05 DIAGNOSIS — N186 End stage renal disease: Secondary | ICD-10-CM | POA: Diagnosis not present

## 2012-12-05 DIAGNOSIS — D631 Anemia in chronic kidney disease: Secondary | ICD-10-CM | POA: Diagnosis not present

## 2012-12-05 DIAGNOSIS — E119 Type 2 diabetes mellitus without complications: Secondary | ICD-10-CM | POA: Diagnosis not present

## 2012-12-05 DIAGNOSIS — N2581 Secondary hyperparathyroidism of renal origin: Secondary | ICD-10-CM | POA: Diagnosis not present

## 2012-12-30 DIAGNOSIS — N186 End stage renal disease: Secondary | ICD-10-CM | POA: Diagnosis not present

## 2012-12-30 DIAGNOSIS — I10 Essential (primary) hypertension: Secondary | ICD-10-CM | POA: Diagnosis not present

## 2012-12-30 DIAGNOSIS — E119 Type 2 diabetes mellitus without complications: Secondary | ICD-10-CM | POA: Diagnosis not present

## 2012-12-30 DIAGNOSIS — T82898A Other specified complication of vascular prosthetic devices, implants and grafts, initial encounter: Secondary | ICD-10-CM | POA: Diagnosis not present

## 2013-01-03 DIAGNOSIS — N186 End stage renal disease: Secondary | ICD-10-CM | POA: Diagnosis not present

## 2013-01-05 DIAGNOSIS — N2581 Secondary hyperparathyroidism of renal origin: Secondary | ICD-10-CM | POA: Diagnosis not present

## 2013-01-05 DIAGNOSIS — N186 End stage renal disease: Secondary | ICD-10-CM | POA: Diagnosis not present

## 2013-01-05 DIAGNOSIS — E119 Type 2 diabetes mellitus without complications: Secondary | ICD-10-CM | POA: Diagnosis not present

## 2013-01-05 DIAGNOSIS — D631 Anemia in chronic kidney disease: Secondary | ICD-10-CM | POA: Diagnosis not present

## 2013-01-05 DIAGNOSIS — D509 Iron deficiency anemia, unspecified: Secondary | ICD-10-CM | POA: Diagnosis not present

## 2013-01-06 ENCOUNTER — Ambulatory Visit: Payer: Self-pay | Admitting: Vascular Surgery

## 2013-01-06 DIAGNOSIS — N186 End stage renal disease: Secondary | ICD-10-CM | POA: Diagnosis not present

## 2013-01-06 DIAGNOSIS — Z79899 Other long term (current) drug therapy: Secondary | ICD-10-CM | POA: Diagnosis not present

## 2013-01-06 DIAGNOSIS — T82898A Other specified complication of vascular prosthetic devices, implants and grafts, initial encounter: Secondary | ICD-10-CM | POA: Diagnosis not present

## 2013-01-06 DIAGNOSIS — Z90710 Acquired absence of both cervix and uterus: Secondary | ICD-10-CM | POA: Diagnosis not present

## 2013-01-06 DIAGNOSIS — Z794 Long term (current) use of insulin: Secondary | ICD-10-CM | POA: Diagnosis not present

## 2013-01-06 DIAGNOSIS — Z7982 Long term (current) use of aspirin: Secondary | ICD-10-CM | POA: Diagnosis not present

## 2013-01-06 DIAGNOSIS — I2510 Atherosclerotic heart disease of native coronary artery without angina pectoris: Secondary | ICD-10-CM | POA: Diagnosis not present

## 2013-01-06 DIAGNOSIS — Z992 Dependence on renal dialysis: Secondary | ICD-10-CM | POA: Diagnosis not present

## 2013-01-06 DIAGNOSIS — E119 Type 2 diabetes mellitus without complications: Secondary | ICD-10-CM | POA: Diagnosis not present

## 2013-01-06 DIAGNOSIS — Z9889 Other specified postprocedural states: Secondary | ICD-10-CM | POA: Diagnosis not present

## 2013-01-21 DIAGNOSIS — E1129 Type 2 diabetes mellitus with other diabetic kidney complication: Secondary | ICD-10-CM | POA: Diagnosis not present

## 2013-01-29 DIAGNOSIS — E119 Type 2 diabetes mellitus without complications: Secondary | ICD-10-CM | POA: Diagnosis not present

## 2013-01-29 DIAGNOSIS — T82898A Other specified complication of vascular prosthetic devices, implants and grafts, initial encounter: Secondary | ICD-10-CM | POA: Diagnosis not present

## 2013-01-29 DIAGNOSIS — N186 End stage renal disease: Secondary | ICD-10-CM | POA: Diagnosis not present

## 2013-01-29 DIAGNOSIS — I10 Essential (primary) hypertension: Secondary | ICD-10-CM | POA: Diagnosis not present

## 2013-02-02 DIAGNOSIS — N186 End stage renal disease: Secondary | ICD-10-CM | POA: Diagnosis not present

## 2013-02-04 DIAGNOSIS — T82898A Other specified complication of vascular prosthetic devices, implants and grafts, initial encounter: Secondary | ICD-10-CM | POA: Diagnosis not present

## 2013-02-04 DIAGNOSIS — E119 Type 2 diabetes mellitus without complications: Secondary | ICD-10-CM | POA: Diagnosis not present

## 2013-02-04 DIAGNOSIS — D631 Anemia in chronic kidney disease: Secondary | ICD-10-CM | POA: Diagnosis not present

## 2013-02-04 DIAGNOSIS — N186 End stage renal disease: Secondary | ICD-10-CM | POA: Diagnosis not present

## 2013-02-04 DIAGNOSIS — Z23 Encounter for immunization: Secondary | ICD-10-CM | POA: Diagnosis not present

## 2013-02-04 DIAGNOSIS — N2581 Secondary hyperparathyroidism of renal origin: Secondary | ICD-10-CM | POA: Diagnosis not present

## 2013-02-06 DIAGNOSIS — N186 End stage renal disease: Secondary | ICD-10-CM | POA: Diagnosis not present

## 2013-02-06 DIAGNOSIS — Z23 Encounter for immunization: Secondary | ICD-10-CM | POA: Diagnosis not present

## 2013-02-06 DIAGNOSIS — T82898A Other specified complication of vascular prosthetic devices, implants and grafts, initial encounter: Secondary | ICD-10-CM | POA: Diagnosis not present

## 2013-02-06 DIAGNOSIS — N2581 Secondary hyperparathyroidism of renal origin: Secondary | ICD-10-CM | POA: Diagnosis not present

## 2013-02-06 DIAGNOSIS — D631 Anemia in chronic kidney disease: Secondary | ICD-10-CM | POA: Diagnosis not present

## 2013-02-06 DIAGNOSIS — E119 Type 2 diabetes mellitus without complications: Secondary | ICD-10-CM | POA: Diagnosis not present

## 2013-02-09 DIAGNOSIS — I871 Compression of vein: Secondary | ICD-10-CM | POA: Diagnosis not present

## 2013-02-09 DIAGNOSIS — T82898A Other specified complication of vascular prosthetic devices, implants and grafts, initial encounter: Secondary | ICD-10-CM | POA: Diagnosis not present

## 2013-02-09 DIAGNOSIS — I771 Stricture of artery: Secondary | ICD-10-CM | POA: Diagnosis not present

## 2013-02-10 DIAGNOSIS — E119 Type 2 diabetes mellitus without complications: Secondary | ICD-10-CM | POA: Diagnosis not present

## 2013-02-10 DIAGNOSIS — N186 End stage renal disease: Secondary | ICD-10-CM | POA: Diagnosis not present

## 2013-02-10 DIAGNOSIS — N2581 Secondary hyperparathyroidism of renal origin: Secondary | ICD-10-CM | POA: Diagnosis not present

## 2013-02-10 DIAGNOSIS — Z23 Encounter for immunization: Secondary | ICD-10-CM | POA: Diagnosis not present

## 2013-02-10 DIAGNOSIS — T82898A Other specified complication of vascular prosthetic devices, implants and grafts, initial encounter: Secondary | ICD-10-CM | POA: Diagnosis not present

## 2013-02-10 DIAGNOSIS — D631 Anemia in chronic kidney disease: Secondary | ICD-10-CM | POA: Diagnosis not present

## 2013-02-12 DIAGNOSIS — D631 Anemia in chronic kidney disease: Secondary | ICD-10-CM | POA: Diagnosis not present

## 2013-02-12 DIAGNOSIS — T82898A Other specified complication of vascular prosthetic devices, implants and grafts, initial encounter: Secondary | ICD-10-CM | POA: Diagnosis not present

## 2013-02-12 DIAGNOSIS — Z23 Encounter for immunization: Secondary | ICD-10-CM | POA: Diagnosis not present

## 2013-02-12 DIAGNOSIS — N2581 Secondary hyperparathyroidism of renal origin: Secondary | ICD-10-CM | POA: Diagnosis not present

## 2013-02-12 DIAGNOSIS — N186 End stage renal disease: Secondary | ICD-10-CM | POA: Diagnosis not present

## 2013-02-12 DIAGNOSIS — E119 Type 2 diabetes mellitus without complications: Secondary | ICD-10-CM | POA: Diagnosis not present

## 2013-02-13 DIAGNOSIS — D631 Anemia in chronic kidney disease: Secondary | ICD-10-CM | POA: Diagnosis not present

## 2013-02-13 DIAGNOSIS — Z23 Encounter for immunization: Secondary | ICD-10-CM | POA: Diagnosis not present

## 2013-02-13 DIAGNOSIS — N2581 Secondary hyperparathyroidism of renal origin: Secondary | ICD-10-CM | POA: Diagnosis not present

## 2013-02-13 DIAGNOSIS — T82898A Other specified complication of vascular prosthetic devices, implants and grafts, initial encounter: Secondary | ICD-10-CM | POA: Diagnosis not present

## 2013-02-13 DIAGNOSIS — N186 End stage renal disease: Secondary | ICD-10-CM | POA: Diagnosis not present

## 2013-02-13 DIAGNOSIS — E119 Type 2 diabetes mellitus without complications: Secondary | ICD-10-CM | POA: Diagnosis not present

## 2013-02-16 DIAGNOSIS — N186 End stage renal disease: Secondary | ICD-10-CM | POA: Diagnosis not present

## 2013-02-16 DIAGNOSIS — D631 Anemia in chronic kidney disease: Secondary | ICD-10-CM | POA: Diagnosis not present

## 2013-02-16 DIAGNOSIS — Z23 Encounter for immunization: Secondary | ICD-10-CM | POA: Diagnosis not present

## 2013-02-16 DIAGNOSIS — E119 Type 2 diabetes mellitus without complications: Secondary | ICD-10-CM | POA: Diagnosis not present

## 2013-02-16 DIAGNOSIS — N2581 Secondary hyperparathyroidism of renal origin: Secondary | ICD-10-CM | POA: Diagnosis not present

## 2013-02-16 DIAGNOSIS — T82898A Other specified complication of vascular prosthetic devices, implants and grafts, initial encounter: Secondary | ICD-10-CM | POA: Diagnosis not present

## 2013-02-18 DIAGNOSIS — T82898A Other specified complication of vascular prosthetic devices, implants and grafts, initial encounter: Secondary | ICD-10-CM | POA: Diagnosis not present

## 2013-02-18 DIAGNOSIS — Z23 Encounter for immunization: Secondary | ICD-10-CM | POA: Diagnosis not present

## 2013-02-18 DIAGNOSIS — E119 Type 2 diabetes mellitus without complications: Secondary | ICD-10-CM | POA: Diagnosis not present

## 2013-02-18 DIAGNOSIS — N2581 Secondary hyperparathyroidism of renal origin: Secondary | ICD-10-CM | POA: Diagnosis not present

## 2013-02-18 DIAGNOSIS — D631 Anemia in chronic kidney disease: Secondary | ICD-10-CM | POA: Diagnosis not present

## 2013-02-18 DIAGNOSIS — N186 End stage renal disease: Secondary | ICD-10-CM | POA: Diagnosis not present

## 2013-02-20 DIAGNOSIS — N186 End stage renal disease: Secondary | ICD-10-CM | POA: Diagnosis not present

## 2013-02-20 DIAGNOSIS — Z23 Encounter for immunization: Secondary | ICD-10-CM | POA: Diagnosis not present

## 2013-02-20 DIAGNOSIS — E119 Type 2 diabetes mellitus without complications: Secondary | ICD-10-CM | POA: Diagnosis not present

## 2013-02-20 DIAGNOSIS — T82898A Other specified complication of vascular prosthetic devices, implants and grafts, initial encounter: Secondary | ICD-10-CM | POA: Diagnosis not present

## 2013-02-20 DIAGNOSIS — N2581 Secondary hyperparathyroidism of renal origin: Secondary | ICD-10-CM | POA: Diagnosis not present

## 2013-02-20 DIAGNOSIS — D631 Anemia in chronic kidney disease: Secondary | ICD-10-CM | POA: Diagnosis not present

## 2013-02-23 DIAGNOSIS — T82898A Other specified complication of vascular prosthetic devices, implants and grafts, initial encounter: Secondary | ICD-10-CM | POA: Diagnosis not present

## 2013-02-23 DIAGNOSIS — T82598A Other mechanical complication of other cardiac and vascular devices and implants, initial encounter: Secondary | ICD-10-CM | POA: Diagnosis not present

## 2013-02-24 DIAGNOSIS — Z23 Encounter for immunization: Secondary | ICD-10-CM | POA: Diagnosis not present

## 2013-02-24 DIAGNOSIS — D631 Anemia in chronic kidney disease: Secondary | ICD-10-CM | POA: Diagnosis not present

## 2013-02-24 DIAGNOSIS — T82898A Other specified complication of vascular prosthetic devices, implants and grafts, initial encounter: Secondary | ICD-10-CM | POA: Diagnosis not present

## 2013-02-24 DIAGNOSIS — N186 End stage renal disease: Secondary | ICD-10-CM | POA: Diagnosis not present

## 2013-02-24 DIAGNOSIS — E119 Type 2 diabetes mellitus without complications: Secondary | ICD-10-CM | POA: Diagnosis not present

## 2013-02-24 DIAGNOSIS — N2581 Secondary hyperparathyroidism of renal origin: Secondary | ICD-10-CM | POA: Diagnosis not present

## 2013-02-25 DIAGNOSIS — E119 Type 2 diabetes mellitus without complications: Secondary | ICD-10-CM | POA: Diagnosis not present

## 2013-02-25 DIAGNOSIS — N186 End stage renal disease: Secondary | ICD-10-CM | POA: Diagnosis not present

## 2013-02-25 DIAGNOSIS — D631 Anemia in chronic kidney disease: Secondary | ICD-10-CM | POA: Diagnosis not present

## 2013-02-25 DIAGNOSIS — T82898A Other specified complication of vascular prosthetic devices, implants and grafts, initial encounter: Secondary | ICD-10-CM | POA: Diagnosis not present

## 2013-02-25 DIAGNOSIS — Z23 Encounter for immunization: Secondary | ICD-10-CM | POA: Diagnosis not present

## 2013-02-25 DIAGNOSIS — N2581 Secondary hyperparathyroidism of renal origin: Secondary | ICD-10-CM | POA: Diagnosis not present

## 2013-02-27 DIAGNOSIS — D631 Anemia in chronic kidney disease: Secondary | ICD-10-CM | POA: Diagnosis not present

## 2013-02-27 DIAGNOSIS — T82898A Other specified complication of vascular prosthetic devices, implants and grafts, initial encounter: Secondary | ICD-10-CM | POA: Diagnosis not present

## 2013-02-27 DIAGNOSIS — N2581 Secondary hyperparathyroidism of renal origin: Secondary | ICD-10-CM | POA: Diagnosis not present

## 2013-02-27 DIAGNOSIS — E119 Type 2 diabetes mellitus without complications: Secondary | ICD-10-CM | POA: Diagnosis not present

## 2013-02-27 DIAGNOSIS — N186 End stage renal disease: Secondary | ICD-10-CM | POA: Diagnosis not present

## 2013-02-27 DIAGNOSIS — Z23 Encounter for immunization: Secondary | ICD-10-CM | POA: Diagnosis not present

## 2013-03-02 DIAGNOSIS — N186 End stage renal disease: Secondary | ICD-10-CM | POA: Diagnosis not present

## 2013-03-02 DIAGNOSIS — E119 Type 2 diabetes mellitus without complications: Secondary | ICD-10-CM | POA: Diagnosis not present

## 2013-03-02 DIAGNOSIS — T82898A Other specified complication of vascular prosthetic devices, implants and grafts, initial encounter: Secondary | ICD-10-CM | POA: Diagnosis not present

## 2013-03-02 DIAGNOSIS — D631 Anemia in chronic kidney disease: Secondary | ICD-10-CM | POA: Diagnosis not present

## 2013-03-02 DIAGNOSIS — Z23 Encounter for immunization: Secondary | ICD-10-CM | POA: Diagnosis not present

## 2013-03-02 DIAGNOSIS — N2581 Secondary hyperparathyroidism of renal origin: Secondary | ICD-10-CM | POA: Diagnosis not present

## 2013-03-04 DIAGNOSIS — D631 Anemia in chronic kidney disease: Secondary | ICD-10-CM | POA: Diagnosis not present

## 2013-03-04 DIAGNOSIS — Z23 Encounter for immunization: Secondary | ICD-10-CM | POA: Diagnosis not present

## 2013-03-04 DIAGNOSIS — T82898A Other specified complication of vascular prosthetic devices, implants and grafts, initial encounter: Secondary | ICD-10-CM | POA: Diagnosis not present

## 2013-03-04 DIAGNOSIS — N186 End stage renal disease: Secondary | ICD-10-CM | POA: Diagnosis not present

## 2013-03-04 DIAGNOSIS — E119 Type 2 diabetes mellitus without complications: Secondary | ICD-10-CM | POA: Diagnosis not present

## 2013-03-04 DIAGNOSIS — N2581 Secondary hyperparathyroidism of renal origin: Secondary | ICD-10-CM | POA: Diagnosis not present

## 2013-03-05 DIAGNOSIS — N186 End stage renal disease: Secondary | ICD-10-CM | POA: Diagnosis not present

## 2013-03-06 DIAGNOSIS — E119 Type 2 diabetes mellitus without complications: Secondary | ICD-10-CM | POA: Diagnosis not present

## 2013-03-06 DIAGNOSIS — Z23 Encounter for immunization: Secondary | ICD-10-CM | POA: Diagnosis not present

## 2013-03-06 DIAGNOSIS — D631 Anemia in chronic kidney disease: Secondary | ICD-10-CM | POA: Diagnosis not present

## 2013-03-06 DIAGNOSIS — N186 End stage renal disease: Secondary | ICD-10-CM | POA: Diagnosis not present

## 2013-03-06 DIAGNOSIS — N2581 Secondary hyperparathyroidism of renal origin: Secondary | ICD-10-CM | POA: Diagnosis not present

## 2013-03-09 ENCOUNTER — Ambulatory Visit: Payer: Self-pay | Admitting: Vascular Surgery

## 2013-03-09 DIAGNOSIS — N186 End stage renal disease: Secondary | ICD-10-CM | POA: Diagnosis not present

## 2013-03-09 DIAGNOSIS — I120 Hypertensive chronic kidney disease with stage 5 chronic kidney disease or end stage renal disease: Secondary | ICD-10-CM | POA: Diagnosis not present

## 2013-03-09 DIAGNOSIS — T82898A Other specified complication of vascular prosthetic devices, implants and grafts, initial encounter: Secondary | ICD-10-CM | POA: Diagnosis not present

## 2013-03-09 DIAGNOSIS — Z992 Dependence on renal dialysis: Secondary | ICD-10-CM | POA: Diagnosis not present

## 2013-03-09 DIAGNOSIS — I871 Compression of vein: Secondary | ICD-10-CM | POA: Diagnosis not present

## 2013-03-15 DIAGNOSIS — E785 Hyperlipidemia, unspecified: Secondary | ICD-10-CM | POA: Diagnosis not present

## 2013-03-15 DIAGNOSIS — N186 End stage renal disease: Secondary | ICD-10-CM | POA: Diagnosis not present

## 2013-03-15 DIAGNOSIS — T82898A Other specified complication of vascular prosthetic devices, implants and grafts, initial encounter: Secondary | ICD-10-CM | POA: Diagnosis not present

## 2013-03-15 DIAGNOSIS — I871 Compression of vein: Secondary | ICD-10-CM | POA: Diagnosis not present

## 2013-03-29 ENCOUNTER — Ambulatory Visit: Payer: Self-pay | Admitting: Vascular Surgery

## 2013-03-29 DIAGNOSIS — Z79899 Other long term (current) drug therapy: Secondary | ICD-10-CM | POA: Diagnosis not present

## 2013-03-29 DIAGNOSIS — Z01810 Encounter for preprocedural cardiovascular examination: Secondary | ICD-10-CM | POA: Diagnosis not present

## 2013-03-29 DIAGNOSIS — I120 Hypertensive chronic kidney disease with stage 5 chronic kidney disease or end stage renal disease: Secondary | ICD-10-CM | POA: Diagnosis not present

## 2013-03-29 DIAGNOSIS — E1129 Type 2 diabetes mellitus with other diabetic kidney complication: Secondary | ICD-10-CM | POA: Diagnosis not present

## 2013-03-29 DIAGNOSIS — Z7902 Long term (current) use of antithrombotics/antiplatelets: Secondary | ICD-10-CM | POA: Diagnosis not present

## 2013-03-29 DIAGNOSIS — I10 Essential (primary) hypertension: Secondary | ICD-10-CM | POA: Diagnosis not present

## 2013-03-29 DIAGNOSIS — N186 End stage renal disease: Secondary | ICD-10-CM | POA: Diagnosis not present

## 2013-03-29 DIAGNOSIS — Z01812 Encounter for preprocedural laboratory examination: Secondary | ICD-10-CM | POA: Diagnosis not present

## 2013-03-29 DIAGNOSIS — T82898A Other specified complication of vascular prosthetic devices, implants and grafts, initial encounter: Secondary | ICD-10-CM | POA: Diagnosis not present

## 2013-03-29 DIAGNOSIS — Z7982 Long term (current) use of aspirin: Secondary | ICD-10-CM | POA: Diagnosis not present

## 2013-03-29 LAB — BASIC METABOLIC PANEL
Anion Gap: 11 (ref 7–16)
Chloride: 97 mmol/L — ABNORMAL LOW (ref 98–107)
Co2: 27 mmol/L (ref 21–32)
Creatinine: 12.06 mg/dL — ABNORMAL HIGH (ref 0.60–1.30)
EGFR (African American): 3 — ABNORMAL LOW
EGFR (Non-African Amer.): 3 — ABNORMAL LOW
Glucose: 97 mg/dL (ref 65–99)
Osmolality: 283 (ref 275–301)
Potassium: 4.5 mmol/L (ref 3.5–5.1)
Sodium: 135 mmol/L — ABNORMAL LOW (ref 136–145)

## 2013-03-29 LAB — CBC
HCT: 31.2 % — ABNORMAL LOW (ref 35.0–47.0)
MCH: 27.6 pg (ref 26.0–34.0)
MCHC: 32.3 g/dL (ref 32.0–36.0)
MCV: 85 fL (ref 80–100)
Platelet: 189 10*3/uL (ref 150–440)
RBC: 3.65 10*6/uL — ABNORMAL LOW (ref 3.80–5.20)
RDW: 15.2 % — ABNORMAL HIGH (ref 11.5–14.5)

## 2013-04-04 DIAGNOSIS — N186 End stage renal disease: Secondary | ICD-10-CM | POA: Diagnosis not present

## 2013-04-06 DIAGNOSIS — N186 End stage renal disease: Secondary | ICD-10-CM | POA: Diagnosis not present

## 2013-04-06 DIAGNOSIS — N2581 Secondary hyperparathyroidism of renal origin: Secondary | ICD-10-CM | POA: Diagnosis not present

## 2013-04-06 DIAGNOSIS — E119 Type 2 diabetes mellitus without complications: Secondary | ICD-10-CM | POA: Diagnosis not present

## 2013-04-06 DIAGNOSIS — D509 Iron deficiency anemia, unspecified: Secondary | ICD-10-CM | POA: Diagnosis not present

## 2013-04-06 DIAGNOSIS — D631 Anemia in chronic kidney disease: Secondary | ICD-10-CM | POA: Diagnosis not present

## 2013-04-08 DIAGNOSIS — I2510 Atherosclerotic heart disease of native coronary artery without angina pectoris: Secondary | ICD-10-CM | POA: Diagnosis not present

## 2013-04-08 DIAGNOSIS — I120 Hypertensive chronic kidney disease with stage 5 chronic kidney disease or end stage renal disease: Secondary | ICD-10-CM | POA: Diagnosis not present

## 2013-04-08 DIAGNOSIS — Z79899 Other long term (current) drug therapy: Secondary | ICD-10-CM | POA: Diagnosis not present

## 2013-04-08 DIAGNOSIS — T82898A Other specified complication of vascular prosthetic devices, implants and grafts, initial encounter: Secondary | ICD-10-CM | POA: Diagnosis not present

## 2013-04-08 DIAGNOSIS — I871 Compression of vein: Secondary | ICD-10-CM | POA: Diagnosis not present

## 2013-04-08 DIAGNOSIS — Z992 Dependence on renal dialysis: Secondary | ICD-10-CM | POA: Diagnosis not present

## 2013-04-08 DIAGNOSIS — Z7982 Long term (current) use of aspirin: Secondary | ICD-10-CM | POA: Diagnosis not present

## 2013-04-08 DIAGNOSIS — I82C19 Acute embolism and thrombosis of unspecified internal jugular vein: Secondary | ICD-10-CM | POA: Diagnosis not present

## 2013-04-08 DIAGNOSIS — N186 End stage renal disease: Secondary | ICD-10-CM | POA: Diagnosis not present

## 2013-04-08 DIAGNOSIS — Z885 Allergy status to narcotic agent status: Secondary | ICD-10-CM | POA: Diagnosis not present

## 2013-04-08 DIAGNOSIS — Z951 Presence of aortocoronary bypass graft: Secondary | ICD-10-CM | POA: Diagnosis not present

## 2013-04-08 DIAGNOSIS — Z91041 Radiographic dye allergy status: Secondary | ICD-10-CM | POA: Diagnosis not present

## 2013-04-08 DIAGNOSIS — E119 Type 2 diabetes mellitus without complications: Secondary | ICD-10-CM | POA: Diagnosis not present

## 2013-04-09 ENCOUNTER — Ambulatory Visit: Payer: Self-pay | Admitting: Vascular Surgery

## 2013-04-09 DIAGNOSIS — Z992 Dependence on renal dialysis: Secondary | ICD-10-CM | POA: Diagnosis not present

## 2013-04-09 DIAGNOSIS — T82898A Other specified complication of vascular prosthetic devices, implants and grafts, initial encounter: Secondary | ICD-10-CM | POA: Diagnosis not present

## 2013-04-09 DIAGNOSIS — T82598A Other mechanical complication of other cardiac and vascular devices and implants, initial encounter: Secondary | ICD-10-CM | POA: Diagnosis not present

## 2013-04-09 DIAGNOSIS — I120 Hypertensive chronic kidney disease with stage 5 chronic kidney disease or end stage renal disease: Secondary | ICD-10-CM | POA: Diagnosis not present

## 2013-04-09 DIAGNOSIS — I871 Compression of vein: Secondary | ICD-10-CM | POA: Diagnosis not present

## 2013-04-09 DIAGNOSIS — I82C19 Acute embolism and thrombosis of unspecified internal jugular vein: Secondary | ICD-10-CM | POA: Diagnosis not present

## 2013-04-09 DIAGNOSIS — N186 End stage renal disease: Secondary | ICD-10-CM | POA: Diagnosis not present

## 2013-04-22 DIAGNOSIS — E1129 Type 2 diabetes mellitus with other diabetic kidney complication: Secondary | ICD-10-CM | POA: Diagnosis not present

## 2013-04-23 DIAGNOSIS — I10 Essential (primary) hypertension: Secondary | ICD-10-CM | POA: Diagnosis not present

## 2013-04-23 DIAGNOSIS — M79609 Pain in unspecified limb: Secondary | ICD-10-CM | POA: Diagnosis not present

## 2013-04-23 DIAGNOSIS — N186 End stage renal disease: Secondary | ICD-10-CM | POA: Diagnosis not present

## 2013-04-23 DIAGNOSIS — E119 Type 2 diabetes mellitus without complications: Secondary | ICD-10-CM | POA: Diagnosis not present

## 2013-05-05 DIAGNOSIS — N186 End stage renal disease: Secondary | ICD-10-CM | POA: Diagnosis not present

## 2013-05-08 DIAGNOSIS — D631 Anemia in chronic kidney disease: Secondary | ICD-10-CM | POA: Diagnosis not present

## 2013-05-08 DIAGNOSIS — N2581 Secondary hyperparathyroidism of renal origin: Secondary | ICD-10-CM | POA: Diagnosis not present

## 2013-05-08 DIAGNOSIS — N186 End stage renal disease: Secondary | ICD-10-CM | POA: Diagnosis not present

## 2013-05-08 DIAGNOSIS — E119 Type 2 diabetes mellitus without complications: Secondary | ICD-10-CM | POA: Diagnosis not present

## 2013-05-08 DIAGNOSIS — D509 Iron deficiency anemia, unspecified: Secondary | ICD-10-CM | POA: Diagnosis not present

## 2013-05-10 DIAGNOSIS — T82898A Other specified complication of vascular prosthetic devices, implants and grafts, initial encounter: Secondary | ICD-10-CM | POA: Diagnosis not present

## 2013-05-10 DIAGNOSIS — E119 Type 2 diabetes mellitus without complications: Secondary | ICD-10-CM | POA: Diagnosis not present

## 2013-05-10 DIAGNOSIS — N186 End stage renal disease: Secondary | ICD-10-CM | POA: Diagnosis not present

## 2013-05-10 DIAGNOSIS — I10 Essential (primary) hypertension: Secondary | ICD-10-CM | POA: Diagnosis not present

## 2013-06-05 DIAGNOSIS — N186 End stage renal disease: Secondary | ICD-10-CM | POA: Diagnosis not present

## 2013-06-08 DIAGNOSIS — N2581 Secondary hyperparathyroidism of renal origin: Secondary | ICD-10-CM | POA: Diagnosis not present

## 2013-06-08 DIAGNOSIS — D631 Anemia in chronic kidney disease: Secondary | ICD-10-CM | POA: Diagnosis not present

## 2013-06-08 DIAGNOSIS — N186 End stage renal disease: Secondary | ICD-10-CM | POA: Diagnosis not present

## 2013-06-08 DIAGNOSIS — E119 Type 2 diabetes mellitus without complications: Secondary | ICD-10-CM | POA: Diagnosis not present

## 2013-06-10 DIAGNOSIS — E119 Type 2 diabetes mellitus without complications: Secondary | ICD-10-CM | POA: Diagnosis not present

## 2013-06-10 DIAGNOSIS — N2581 Secondary hyperparathyroidism of renal origin: Secondary | ICD-10-CM | POA: Diagnosis not present

## 2013-06-10 DIAGNOSIS — N186 End stage renal disease: Secondary | ICD-10-CM | POA: Diagnosis not present

## 2013-06-10 DIAGNOSIS — D631 Anemia in chronic kidney disease: Secondary | ICD-10-CM | POA: Diagnosis not present

## 2013-06-12 DIAGNOSIS — N186 End stage renal disease: Secondary | ICD-10-CM | POA: Diagnosis not present

## 2013-06-12 DIAGNOSIS — E119 Type 2 diabetes mellitus without complications: Secondary | ICD-10-CM | POA: Diagnosis not present

## 2013-06-12 DIAGNOSIS — D631 Anemia in chronic kidney disease: Secondary | ICD-10-CM | POA: Diagnosis not present

## 2013-06-12 DIAGNOSIS — N2581 Secondary hyperparathyroidism of renal origin: Secondary | ICD-10-CM | POA: Diagnosis not present

## 2013-06-15 DIAGNOSIS — N186 End stage renal disease: Secondary | ICD-10-CM | POA: Diagnosis not present

## 2013-06-15 DIAGNOSIS — E119 Type 2 diabetes mellitus without complications: Secondary | ICD-10-CM | POA: Diagnosis not present

## 2013-06-15 DIAGNOSIS — N2581 Secondary hyperparathyroidism of renal origin: Secondary | ICD-10-CM | POA: Diagnosis not present

## 2013-06-15 DIAGNOSIS — D631 Anemia in chronic kidney disease: Secondary | ICD-10-CM | POA: Diagnosis not present

## 2013-06-16 ENCOUNTER — Ambulatory Visit: Payer: Self-pay | Admitting: Physician Assistant

## 2013-06-16 DIAGNOSIS — N186 End stage renal disease: Secondary | ICD-10-CM | POA: Diagnosis not present

## 2013-06-16 DIAGNOSIS — Z452 Encounter for adjustment and management of vascular access device: Secondary | ICD-10-CM | POA: Diagnosis not present

## 2013-06-17 DIAGNOSIS — D631 Anemia in chronic kidney disease: Secondary | ICD-10-CM | POA: Diagnosis not present

## 2013-06-17 DIAGNOSIS — N186 End stage renal disease: Secondary | ICD-10-CM | POA: Diagnosis not present

## 2013-06-17 DIAGNOSIS — E119 Type 2 diabetes mellitus without complications: Secondary | ICD-10-CM | POA: Diagnosis not present

## 2013-06-17 DIAGNOSIS — N2581 Secondary hyperparathyroidism of renal origin: Secondary | ICD-10-CM | POA: Diagnosis not present

## 2013-06-19 DIAGNOSIS — D631 Anemia in chronic kidney disease: Secondary | ICD-10-CM | POA: Diagnosis not present

## 2013-06-19 DIAGNOSIS — N2581 Secondary hyperparathyroidism of renal origin: Secondary | ICD-10-CM | POA: Diagnosis not present

## 2013-06-19 DIAGNOSIS — E119 Type 2 diabetes mellitus without complications: Secondary | ICD-10-CM | POA: Diagnosis not present

## 2013-06-19 DIAGNOSIS — N186 End stage renal disease: Secondary | ICD-10-CM | POA: Diagnosis not present

## 2013-06-22 DIAGNOSIS — N039 Chronic nephritic syndrome with unspecified morphologic changes: Secondary | ICD-10-CM | POA: Diagnosis not present

## 2013-06-22 DIAGNOSIS — D631 Anemia in chronic kidney disease: Secondary | ICD-10-CM | POA: Diagnosis not present

## 2013-06-22 DIAGNOSIS — N2581 Secondary hyperparathyroidism of renal origin: Secondary | ICD-10-CM | POA: Diagnosis not present

## 2013-06-22 DIAGNOSIS — E119 Type 2 diabetes mellitus without complications: Secondary | ICD-10-CM | POA: Diagnosis not present

## 2013-06-22 DIAGNOSIS — N186 End stage renal disease: Secondary | ICD-10-CM | POA: Diagnosis not present

## 2013-06-24 DIAGNOSIS — E119 Type 2 diabetes mellitus without complications: Secondary | ICD-10-CM | POA: Diagnosis not present

## 2013-06-24 DIAGNOSIS — N2581 Secondary hyperparathyroidism of renal origin: Secondary | ICD-10-CM | POA: Diagnosis not present

## 2013-06-24 DIAGNOSIS — N186 End stage renal disease: Secondary | ICD-10-CM | POA: Diagnosis not present

## 2013-06-24 DIAGNOSIS — D631 Anemia in chronic kidney disease: Secondary | ICD-10-CM | POA: Diagnosis not present

## 2013-06-26 DIAGNOSIS — N186 End stage renal disease: Secondary | ICD-10-CM | POA: Diagnosis not present

## 2013-06-26 DIAGNOSIS — D631 Anemia in chronic kidney disease: Secondary | ICD-10-CM | POA: Diagnosis not present

## 2013-06-26 DIAGNOSIS — E119 Type 2 diabetes mellitus without complications: Secondary | ICD-10-CM | POA: Diagnosis not present

## 2013-06-26 DIAGNOSIS — N2581 Secondary hyperparathyroidism of renal origin: Secondary | ICD-10-CM | POA: Diagnosis not present

## 2013-06-29 DIAGNOSIS — N2581 Secondary hyperparathyroidism of renal origin: Secondary | ICD-10-CM | POA: Diagnosis not present

## 2013-06-29 DIAGNOSIS — N186 End stage renal disease: Secondary | ICD-10-CM | POA: Diagnosis not present

## 2013-06-29 DIAGNOSIS — E119 Type 2 diabetes mellitus without complications: Secondary | ICD-10-CM | POA: Diagnosis not present

## 2013-06-29 DIAGNOSIS — D631 Anemia in chronic kidney disease: Secondary | ICD-10-CM | POA: Diagnosis not present

## 2013-07-01 DIAGNOSIS — D631 Anemia in chronic kidney disease: Secondary | ICD-10-CM | POA: Diagnosis not present

## 2013-07-01 DIAGNOSIS — E119 Type 2 diabetes mellitus without complications: Secondary | ICD-10-CM | POA: Diagnosis not present

## 2013-07-01 DIAGNOSIS — N186 End stage renal disease: Secondary | ICD-10-CM | POA: Diagnosis not present

## 2013-07-01 DIAGNOSIS — N2581 Secondary hyperparathyroidism of renal origin: Secondary | ICD-10-CM | POA: Diagnosis not present

## 2013-07-03 DIAGNOSIS — E119 Type 2 diabetes mellitus without complications: Secondary | ICD-10-CM | POA: Diagnosis not present

## 2013-07-03 DIAGNOSIS — N2581 Secondary hyperparathyroidism of renal origin: Secondary | ICD-10-CM | POA: Diagnosis not present

## 2013-07-03 DIAGNOSIS — D631 Anemia in chronic kidney disease: Secondary | ICD-10-CM | POA: Diagnosis not present

## 2013-07-03 DIAGNOSIS — N186 End stage renal disease: Secondary | ICD-10-CM | POA: Diagnosis not present

## 2013-07-03 DIAGNOSIS — N039 Chronic nephritic syndrome with unspecified morphologic changes: Secondary | ICD-10-CM | POA: Diagnosis not present

## 2013-07-06 DIAGNOSIS — N186 End stage renal disease: Secondary | ICD-10-CM | POA: Diagnosis not present

## 2013-07-06 DIAGNOSIS — N039 Chronic nephritic syndrome with unspecified morphologic changes: Secondary | ICD-10-CM | POA: Diagnosis not present

## 2013-07-06 DIAGNOSIS — D509 Iron deficiency anemia, unspecified: Secondary | ICD-10-CM | POA: Diagnosis not present

## 2013-07-06 DIAGNOSIS — E119 Type 2 diabetes mellitus without complications: Secondary | ICD-10-CM | POA: Diagnosis not present

## 2013-07-06 DIAGNOSIS — E1129 Type 2 diabetes mellitus with other diabetic kidney complication: Secondary | ICD-10-CM | POA: Diagnosis not present

## 2013-07-06 DIAGNOSIS — D631 Anemia in chronic kidney disease: Secondary | ICD-10-CM | POA: Diagnosis not present

## 2013-07-06 DIAGNOSIS — N2581 Secondary hyperparathyroidism of renal origin: Secondary | ICD-10-CM | POA: Diagnosis not present

## 2013-07-08 DIAGNOSIS — D509 Iron deficiency anemia, unspecified: Secondary | ICD-10-CM | POA: Diagnosis not present

## 2013-07-08 DIAGNOSIS — E1129 Type 2 diabetes mellitus with other diabetic kidney complication: Secondary | ICD-10-CM | POA: Diagnosis not present

## 2013-07-08 DIAGNOSIS — N186 End stage renal disease: Secondary | ICD-10-CM | POA: Diagnosis not present

## 2013-07-08 DIAGNOSIS — D631 Anemia in chronic kidney disease: Secondary | ICD-10-CM | POA: Diagnosis not present

## 2013-07-08 DIAGNOSIS — N2581 Secondary hyperparathyroidism of renal origin: Secondary | ICD-10-CM | POA: Diagnosis not present

## 2013-07-10 DIAGNOSIS — N039 Chronic nephritic syndrome with unspecified morphologic changes: Secondary | ICD-10-CM | POA: Diagnosis not present

## 2013-07-10 DIAGNOSIS — N2581 Secondary hyperparathyroidism of renal origin: Secondary | ICD-10-CM | POA: Diagnosis not present

## 2013-07-10 DIAGNOSIS — E1129 Type 2 diabetes mellitus with other diabetic kidney complication: Secondary | ICD-10-CM | POA: Diagnosis not present

## 2013-07-10 DIAGNOSIS — D509 Iron deficiency anemia, unspecified: Secondary | ICD-10-CM | POA: Diagnosis not present

## 2013-07-10 DIAGNOSIS — N186 End stage renal disease: Secondary | ICD-10-CM | POA: Diagnosis not present

## 2013-07-10 DIAGNOSIS — D631 Anemia in chronic kidney disease: Secondary | ICD-10-CM | POA: Diagnosis not present

## 2013-07-13 DIAGNOSIS — N186 End stage renal disease: Secondary | ICD-10-CM | POA: Diagnosis not present

## 2013-07-13 DIAGNOSIS — E1129 Type 2 diabetes mellitus with other diabetic kidney complication: Secondary | ICD-10-CM | POA: Diagnosis not present

## 2013-07-13 DIAGNOSIS — D509 Iron deficiency anemia, unspecified: Secondary | ICD-10-CM | POA: Diagnosis not present

## 2013-07-13 DIAGNOSIS — N2581 Secondary hyperparathyroidism of renal origin: Secondary | ICD-10-CM | POA: Diagnosis not present

## 2013-07-13 DIAGNOSIS — D631 Anemia in chronic kidney disease: Secondary | ICD-10-CM | POA: Diagnosis not present

## 2013-07-15 DIAGNOSIS — D631 Anemia in chronic kidney disease: Secondary | ICD-10-CM | POA: Diagnosis not present

## 2013-07-15 DIAGNOSIS — E1129 Type 2 diabetes mellitus with other diabetic kidney complication: Secondary | ICD-10-CM | POA: Diagnosis not present

## 2013-07-15 DIAGNOSIS — N2581 Secondary hyperparathyroidism of renal origin: Secondary | ICD-10-CM | POA: Diagnosis not present

## 2013-07-15 DIAGNOSIS — N186 End stage renal disease: Secondary | ICD-10-CM | POA: Diagnosis not present

## 2013-07-15 DIAGNOSIS — D509 Iron deficiency anemia, unspecified: Secondary | ICD-10-CM | POA: Diagnosis not present

## 2013-07-17 DIAGNOSIS — N2581 Secondary hyperparathyroidism of renal origin: Secondary | ICD-10-CM | POA: Diagnosis not present

## 2013-07-17 DIAGNOSIS — N186 End stage renal disease: Secondary | ICD-10-CM | POA: Diagnosis not present

## 2013-07-17 DIAGNOSIS — D631 Anemia in chronic kidney disease: Secondary | ICD-10-CM | POA: Diagnosis not present

## 2013-07-17 DIAGNOSIS — N039 Chronic nephritic syndrome with unspecified morphologic changes: Secondary | ICD-10-CM | POA: Diagnosis not present

## 2013-07-17 DIAGNOSIS — E1129 Type 2 diabetes mellitus with other diabetic kidney complication: Secondary | ICD-10-CM | POA: Diagnosis not present

## 2013-07-17 DIAGNOSIS — D509 Iron deficiency anemia, unspecified: Secondary | ICD-10-CM | POA: Diagnosis not present

## 2013-07-20 DIAGNOSIS — N186 End stage renal disease: Secondary | ICD-10-CM | POA: Diagnosis not present

## 2013-07-20 DIAGNOSIS — D509 Iron deficiency anemia, unspecified: Secondary | ICD-10-CM | POA: Diagnosis not present

## 2013-07-20 DIAGNOSIS — E1129 Type 2 diabetes mellitus with other diabetic kidney complication: Secondary | ICD-10-CM | POA: Diagnosis not present

## 2013-07-20 DIAGNOSIS — N2581 Secondary hyperparathyroidism of renal origin: Secondary | ICD-10-CM | POA: Diagnosis not present

## 2013-07-20 DIAGNOSIS — D631 Anemia in chronic kidney disease: Secondary | ICD-10-CM | POA: Diagnosis not present

## 2013-07-21 DIAGNOSIS — R509 Fever, unspecified: Secondary | ICD-10-CM | POA: Insufficient documentation

## 2013-07-21 DIAGNOSIS — D689 Coagulation defect, unspecified: Secondary | ICD-10-CM | POA: Insufficient documentation

## 2013-07-21 DIAGNOSIS — R0602 Shortness of breath: Secondary | ICD-10-CM | POA: Insufficient documentation

## 2013-07-21 DIAGNOSIS — R519 Headache, unspecified: Secondary | ICD-10-CM | POA: Insufficient documentation

## 2013-07-22 DIAGNOSIS — D631 Anemia in chronic kidney disease: Secondary | ICD-10-CM | POA: Diagnosis not present

## 2013-07-22 DIAGNOSIS — N186 End stage renal disease: Secondary | ICD-10-CM | POA: Diagnosis not present

## 2013-07-22 DIAGNOSIS — E1129 Type 2 diabetes mellitus with other diabetic kidney complication: Secondary | ICD-10-CM | POA: Diagnosis not present

## 2013-07-22 DIAGNOSIS — N2581 Secondary hyperparathyroidism of renal origin: Secondary | ICD-10-CM | POA: Diagnosis not present

## 2013-07-22 DIAGNOSIS — D509 Iron deficiency anemia, unspecified: Secondary | ICD-10-CM | POA: Diagnosis not present

## 2013-07-24 DIAGNOSIS — N186 End stage renal disease: Secondary | ICD-10-CM | POA: Diagnosis not present

## 2013-07-24 DIAGNOSIS — D509 Iron deficiency anemia, unspecified: Secondary | ICD-10-CM | POA: Diagnosis not present

## 2013-07-24 DIAGNOSIS — N2581 Secondary hyperparathyroidism of renal origin: Secondary | ICD-10-CM | POA: Diagnosis not present

## 2013-07-24 DIAGNOSIS — D631 Anemia in chronic kidney disease: Secondary | ICD-10-CM | POA: Diagnosis not present

## 2013-07-24 DIAGNOSIS — E1129 Type 2 diabetes mellitus with other diabetic kidney complication: Secondary | ICD-10-CM | POA: Diagnosis not present

## 2013-07-27 DIAGNOSIS — N2581 Secondary hyperparathyroidism of renal origin: Secondary | ICD-10-CM | POA: Diagnosis not present

## 2013-07-27 DIAGNOSIS — N186 End stage renal disease: Secondary | ICD-10-CM | POA: Diagnosis not present

## 2013-07-27 DIAGNOSIS — N039 Chronic nephritic syndrome with unspecified morphologic changes: Secondary | ICD-10-CM | POA: Diagnosis not present

## 2013-07-27 DIAGNOSIS — E1129 Type 2 diabetes mellitus with other diabetic kidney complication: Secondary | ICD-10-CM | POA: Diagnosis not present

## 2013-07-27 DIAGNOSIS — D631 Anemia in chronic kidney disease: Secondary | ICD-10-CM | POA: Diagnosis not present

## 2013-07-27 DIAGNOSIS — D509 Iron deficiency anemia, unspecified: Secondary | ICD-10-CM | POA: Diagnosis not present

## 2013-07-29 DIAGNOSIS — D509 Iron deficiency anemia, unspecified: Secondary | ICD-10-CM | POA: Diagnosis not present

## 2013-07-29 DIAGNOSIS — N2581 Secondary hyperparathyroidism of renal origin: Secondary | ICD-10-CM | POA: Diagnosis not present

## 2013-07-29 DIAGNOSIS — N186 End stage renal disease: Secondary | ICD-10-CM | POA: Diagnosis not present

## 2013-07-29 DIAGNOSIS — E1129 Type 2 diabetes mellitus with other diabetic kidney complication: Secondary | ICD-10-CM | POA: Diagnosis not present

## 2013-07-29 DIAGNOSIS — N039 Chronic nephritic syndrome with unspecified morphologic changes: Secondary | ICD-10-CM | POA: Diagnosis not present

## 2013-07-29 DIAGNOSIS — D631 Anemia in chronic kidney disease: Secondary | ICD-10-CM | POA: Diagnosis not present

## 2013-07-31 DIAGNOSIS — D509 Iron deficiency anemia, unspecified: Secondary | ICD-10-CM | POA: Diagnosis not present

## 2013-07-31 DIAGNOSIS — N186 End stage renal disease: Secondary | ICD-10-CM | POA: Diagnosis not present

## 2013-07-31 DIAGNOSIS — D631 Anemia in chronic kidney disease: Secondary | ICD-10-CM | POA: Diagnosis not present

## 2013-07-31 DIAGNOSIS — E1129 Type 2 diabetes mellitus with other diabetic kidney complication: Secondary | ICD-10-CM | POA: Diagnosis not present

## 2013-07-31 DIAGNOSIS — N2581 Secondary hyperparathyroidism of renal origin: Secondary | ICD-10-CM | POA: Diagnosis not present

## 2013-08-03 DIAGNOSIS — N186 End stage renal disease: Secondary | ICD-10-CM | POA: Diagnosis not present

## 2013-08-03 DIAGNOSIS — D631 Anemia in chronic kidney disease: Secondary | ICD-10-CM | POA: Diagnosis not present

## 2013-08-03 DIAGNOSIS — N039 Chronic nephritic syndrome with unspecified morphologic changes: Secondary | ICD-10-CM | POA: Diagnosis not present

## 2013-08-03 DIAGNOSIS — E1129 Type 2 diabetes mellitus with other diabetic kidney complication: Secondary | ICD-10-CM | POA: Diagnosis not present

## 2013-08-03 DIAGNOSIS — N2581 Secondary hyperparathyroidism of renal origin: Secondary | ICD-10-CM | POA: Diagnosis not present

## 2013-08-03 DIAGNOSIS — D509 Iron deficiency anemia, unspecified: Secondary | ICD-10-CM | POA: Diagnosis not present

## 2013-08-05 DIAGNOSIS — D631 Anemia in chronic kidney disease: Secondary | ICD-10-CM | POA: Diagnosis not present

## 2013-08-05 DIAGNOSIS — N186 End stage renal disease: Secondary | ICD-10-CM | POA: Diagnosis not present

## 2013-08-05 DIAGNOSIS — E1129 Type 2 diabetes mellitus with other diabetic kidney complication: Secondary | ICD-10-CM | POA: Diagnosis not present

## 2013-08-05 DIAGNOSIS — N2581 Secondary hyperparathyroidism of renal origin: Secondary | ICD-10-CM | POA: Diagnosis not present

## 2013-08-07 DIAGNOSIS — N186 End stage renal disease: Secondary | ICD-10-CM | POA: Diagnosis not present

## 2013-08-07 DIAGNOSIS — D631 Anemia in chronic kidney disease: Secondary | ICD-10-CM | POA: Diagnosis not present

## 2013-08-07 DIAGNOSIS — E1129 Type 2 diabetes mellitus with other diabetic kidney complication: Secondary | ICD-10-CM | POA: Diagnosis not present

## 2013-08-07 DIAGNOSIS — N2581 Secondary hyperparathyroidism of renal origin: Secondary | ICD-10-CM | POA: Diagnosis not present

## 2013-08-09 DIAGNOSIS — I10 Essential (primary) hypertension: Secondary | ICD-10-CM | POA: Diagnosis not present

## 2013-08-09 DIAGNOSIS — E785 Hyperlipidemia, unspecified: Secondary | ICD-10-CM | POA: Diagnosis not present

## 2013-08-09 DIAGNOSIS — M79609 Pain in unspecified limb: Secondary | ICD-10-CM | POA: Diagnosis not present

## 2013-08-09 DIAGNOSIS — N186 End stage renal disease: Secondary | ICD-10-CM | POA: Diagnosis not present

## 2013-08-10 DIAGNOSIS — D631 Anemia in chronic kidney disease: Secondary | ICD-10-CM | POA: Diagnosis not present

## 2013-08-10 DIAGNOSIS — E1129 Type 2 diabetes mellitus with other diabetic kidney complication: Secondary | ICD-10-CM | POA: Diagnosis not present

## 2013-08-10 DIAGNOSIS — N2581 Secondary hyperparathyroidism of renal origin: Secondary | ICD-10-CM | POA: Diagnosis not present

## 2013-08-10 DIAGNOSIS — N186 End stage renal disease: Secondary | ICD-10-CM | POA: Diagnosis not present

## 2013-08-10 DIAGNOSIS — I2510 Atherosclerotic heart disease of native coronary artery without angina pectoris: Secondary | ICD-10-CM | POA: Diagnosis not present

## 2013-08-12 DIAGNOSIS — N186 End stage renal disease: Secondary | ICD-10-CM | POA: Diagnosis not present

## 2013-08-12 DIAGNOSIS — N039 Chronic nephritic syndrome with unspecified morphologic changes: Secondary | ICD-10-CM | POA: Diagnosis not present

## 2013-08-12 DIAGNOSIS — D631 Anemia in chronic kidney disease: Secondary | ICD-10-CM | POA: Diagnosis not present

## 2013-08-12 DIAGNOSIS — N2581 Secondary hyperparathyroidism of renal origin: Secondary | ICD-10-CM | POA: Diagnosis not present

## 2013-08-12 DIAGNOSIS — E1129 Type 2 diabetes mellitus with other diabetic kidney complication: Secondary | ICD-10-CM | POA: Diagnosis not present

## 2013-08-14 DIAGNOSIS — D631 Anemia in chronic kidney disease: Secondary | ICD-10-CM | POA: Diagnosis not present

## 2013-08-14 DIAGNOSIS — N2581 Secondary hyperparathyroidism of renal origin: Secondary | ICD-10-CM | POA: Diagnosis not present

## 2013-08-14 DIAGNOSIS — E1129 Type 2 diabetes mellitus with other diabetic kidney complication: Secondary | ICD-10-CM | POA: Diagnosis not present

## 2013-08-14 DIAGNOSIS — N186 End stage renal disease: Secondary | ICD-10-CM | POA: Diagnosis not present

## 2013-08-17 DIAGNOSIS — N2581 Secondary hyperparathyroidism of renal origin: Secondary | ICD-10-CM | POA: Diagnosis not present

## 2013-08-17 DIAGNOSIS — E1129 Type 2 diabetes mellitus with other diabetic kidney complication: Secondary | ICD-10-CM | POA: Diagnosis not present

## 2013-08-17 DIAGNOSIS — D631 Anemia in chronic kidney disease: Secondary | ICD-10-CM | POA: Diagnosis not present

## 2013-08-17 DIAGNOSIS — N186 End stage renal disease: Secondary | ICD-10-CM | POA: Diagnosis not present

## 2013-08-19 DIAGNOSIS — N2581 Secondary hyperparathyroidism of renal origin: Secondary | ICD-10-CM | POA: Diagnosis not present

## 2013-08-19 DIAGNOSIS — N186 End stage renal disease: Secondary | ICD-10-CM | POA: Diagnosis not present

## 2013-08-19 DIAGNOSIS — E1129 Type 2 diabetes mellitus with other diabetic kidney complication: Secondary | ICD-10-CM | POA: Diagnosis not present

## 2013-08-19 DIAGNOSIS — D631 Anemia in chronic kidney disease: Secondary | ICD-10-CM | POA: Diagnosis not present

## 2013-08-21 DIAGNOSIS — D631 Anemia in chronic kidney disease: Secondary | ICD-10-CM | POA: Diagnosis not present

## 2013-08-21 DIAGNOSIS — N186 End stage renal disease: Secondary | ICD-10-CM | POA: Diagnosis not present

## 2013-08-21 DIAGNOSIS — E1129 Type 2 diabetes mellitus with other diabetic kidney complication: Secondary | ICD-10-CM | POA: Diagnosis not present

## 2013-08-21 DIAGNOSIS — N2581 Secondary hyperparathyroidism of renal origin: Secondary | ICD-10-CM | POA: Diagnosis not present

## 2013-08-24 DIAGNOSIS — E1129 Type 2 diabetes mellitus with other diabetic kidney complication: Secondary | ICD-10-CM | POA: Diagnosis not present

## 2013-08-24 DIAGNOSIS — N2581 Secondary hyperparathyroidism of renal origin: Secondary | ICD-10-CM | POA: Diagnosis not present

## 2013-08-24 DIAGNOSIS — N186 End stage renal disease: Secondary | ICD-10-CM | POA: Diagnosis not present

## 2013-08-24 DIAGNOSIS — D631 Anemia in chronic kidney disease: Secondary | ICD-10-CM | POA: Diagnosis not present

## 2013-08-26 DIAGNOSIS — D631 Anemia in chronic kidney disease: Secondary | ICD-10-CM | POA: Diagnosis not present

## 2013-08-26 DIAGNOSIS — N186 End stage renal disease: Secondary | ICD-10-CM | POA: Diagnosis not present

## 2013-08-26 DIAGNOSIS — E1129 Type 2 diabetes mellitus with other diabetic kidney complication: Secondary | ICD-10-CM | POA: Diagnosis not present

## 2013-08-26 DIAGNOSIS — N2581 Secondary hyperparathyroidism of renal origin: Secondary | ICD-10-CM | POA: Diagnosis not present

## 2013-08-28 DIAGNOSIS — N186 End stage renal disease: Secondary | ICD-10-CM | POA: Diagnosis not present

## 2013-08-28 DIAGNOSIS — E1129 Type 2 diabetes mellitus with other diabetic kidney complication: Secondary | ICD-10-CM | POA: Diagnosis not present

## 2013-08-28 DIAGNOSIS — D631 Anemia in chronic kidney disease: Secondary | ICD-10-CM | POA: Diagnosis not present

## 2013-08-28 DIAGNOSIS — N2581 Secondary hyperparathyroidism of renal origin: Secondary | ICD-10-CM | POA: Diagnosis not present

## 2013-08-31 DIAGNOSIS — N2581 Secondary hyperparathyroidism of renal origin: Secondary | ICD-10-CM | POA: Diagnosis not present

## 2013-08-31 DIAGNOSIS — N186 End stage renal disease: Secondary | ICD-10-CM | POA: Diagnosis not present

## 2013-08-31 DIAGNOSIS — N039 Chronic nephritic syndrome with unspecified morphologic changes: Secondary | ICD-10-CM | POA: Diagnosis not present

## 2013-08-31 DIAGNOSIS — D631 Anemia in chronic kidney disease: Secondary | ICD-10-CM | POA: Diagnosis not present

## 2013-08-31 DIAGNOSIS — E1129 Type 2 diabetes mellitus with other diabetic kidney complication: Secondary | ICD-10-CM | POA: Diagnosis not present

## 2013-09-02 DIAGNOSIS — E1129 Type 2 diabetes mellitus with other diabetic kidney complication: Secondary | ICD-10-CM | POA: Diagnosis not present

## 2013-09-02 DIAGNOSIS — N186 End stage renal disease: Secondary | ICD-10-CM | POA: Diagnosis not present

## 2013-09-02 DIAGNOSIS — N2581 Secondary hyperparathyroidism of renal origin: Secondary | ICD-10-CM | POA: Diagnosis not present

## 2013-09-02 DIAGNOSIS — D631 Anemia in chronic kidney disease: Secondary | ICD-10-CM | POA: Diagnosis not present

## 2013-09-04 DIAGNOSIS — N186 End stage renal disease: Secondary | ICD-10-CM | POA: Diagnosis not present

## 2013-09-04 DIAGNOSIS — N2581 Secondary hyperparathyroidism of renal origin: Secondary | ICD-10-CM | POA: Diagnosis not present

## 2013-09-04 DIAGNOSIS — D631 Anemia in chronic kidney disease: Secondary | ICD-10-CM | POA: Diagnosis not present

## 2013-09-04 DIAGNOSIS — E1129 Type 2 diabetes mellitus with other diabetic kidney complication: Secondary | ICD-10-CM | POA: Diagnosis not present

## 2013-09-08 DIAGNOSIS — J9819 Other pulmonary collapse: Secondary | ICD-10-CM | POA: Diagnosis not present

## 2013-09-08 DIAGNOSIS — R05 Cough: Secondary | ICD-10-CM | POA: Diagnosis not present

## 2013-09-08 DIAGNOSIS — R059 Cough, unspecified: Secondary | ICD-10-CM | POA: Diagnosis not present

## 2013-10-03 DIAGNOSIS — N186 End stage renal disease: Secondary | ICD-10-CM | POA: Diagnosis not present

## 2013-10-05 DIAGNOSIS — D631 Anemia in chronic kidney disease: Secondary | ICD-10-CM | POA: Diagnosis not present

## 2013-10-05 DIAGNOSIS — N2581 Secondary hyperparathyroidism of renal origin: Secondary | ICD-10-CM | POA: Diagnosis not present

## 2013-10-05 DIAGNOSIS — N186 End stage renal disease: Secondary | ICD-10-CM | POA: Diagnosis not present

## 2013-10-05 DIAGNOSIS — E1129 Type 2 diabetes mellitus with other diabetic kidney complication: Secondary | ICD-10-CM | POA: Diagnosis not present

## 2013-10-05 DIAGNOSIS — D509 Iron deficiency anemia, unspecified: Secondary | ICD-10-CM | POA: Diagnosis not present

## 2013-10-05 DIAGNOSIS — N039 Chronic nephritic syndrome with unspecified morphologic changes: Secondary | ICD-10-CM | POA: Diagnosis not present

## 2013-10-06 DIAGNOSIS — N186 End stage renal disease: Secondary | ICD-10-CM | POA: Diagnosis not present

## 2013-10-06 DIAGNOSIS — C649 Malignant neoplasm of unspecified kidney, except renal pelvis: Secondary | ICD-10-CM | POA: Insufficient documentation

## 2013-10-13 DIAGNOSIS — N269 Renal sclerosis, unspecified: Secondary | ICD-10-CM | POA: Diagnosis not present

## 2013-10-13 DIAGNOSIS — N2889 Other specified disorders of kidney and ureter: Secondary | ICD-10-CM | POA: Diagnosis not present

## 2013-10-13 DIAGNOSIS — N186 End stage renal disease: Secondary | ICD-10-CM | POA: Diagnosis not present

## 2013-10-13 DIAGNOSIS — Z992 Dependence on renal dialysis: Secondary | ICD-10-CM | POA: Diagnosis not present

## 2013-10-21 DIAGNOSIS — E1129 Type 2 diabetes mellitus with other diabetic kidney complication: Secondary | ICD-10-CM | POA: Diagnosis not present

## 2013-11-02 DIAGNOSIS — N186 End stage renal disease: Secondary | ICD-10-CM | POA: Diagnosis not present

## 2013-11-04 DIAGNOSIS — D631 Anemia in chronic kidney disease: Secondary | ICD-10-CM | POA: Diagnosis not present

## 2013-11-04 DIAGNOSIS — N186 End stage renal disease: Secondary | ICD-10-CM | POA: Diagnosis not present

## 2013-11-04 DIAGNOSIS — N2581 Secondary hyperparathyroidism of renal origin: Secondary | ICD-10-CM | POA: Diagnosis not present

## 2013-11-04 DIAGNOSIS — D509 Iron deficiency anemia, unspecified: Secondary | ICD-10-CM | POA: Diagnosis not present

## 2013-11-04 DIAGNOSIS — E1129 Type 2 diabetes mellitus with other diabetic kidney complication: Secondary | ICD-10-CM | POA: Diagnosis not present

## 2013-11-08 DIAGNOSIS — M7989 Other specified soft tissue disorders: Secondary | ICD-10-CM | POA: Diagnosis not present

## 2013-11-08 DIAGNOSIS — N186 End stage renal disease: Secondary | ICD-10-CM | POA: Diagnosis not present

## 2013-11-08 DIAGNOSIS — T82898A Other specified complication of vascular prosthetic devices, implants and grafts, initial encounter: Secondary | ICD-10-CM | POA: Diagnosis not present

## 2013-11-08 DIAGNOSIS — M79609 Pain in unspecified limb: Secondary | ICD-10-CM | POA: Diagnosis not present

## 2013-12-03 DIAGNOSIS — N186 End stage renal disease: Secondary | ICD-10-CM | POA: Diagnosis not present

## 2013-12-04 DIAGNOSIS — E1129 Type 2 diabetes mellitus with other diabetic kidney complication: Secondary | ICD-10-CM | POA: Diagnosis not present

## 2013-12-04 DIAGNOSIS — N2581 Secondary hyperparathyroidism of renal origin: Secondary | ICD-10-CM | POA: Diagnosis not present

## 2013-12-04 DIAGNOSIS — N186 End stage renal disease: Secondary | ICD-10-CM | POA: Diagnosis not present

## 2013-12-07 DIAGNOSIS — E1129 Type 2 diabetes mellitus with other diabetic kidney complication: Secondary | ICD-10-CM | POA: Diagnosis not present

## 2013-12-07 DIAGNOSIS — N186 End stage renal disease: Secondary | ICD-10-CM | POA: Diagnosis not present

## 2013-12-07 DIAGNOSIS — N2581 Secondary hyperparathyroidism of renal origin: Secondary | ICD-10-CM | POA: Diagnosis not present

## 2013-12-09 DIAGNOSIS — E1129 Type 2 diabetes mellitus with other diabetic kidney complication: Secondary | ICD-10-CM | POA: Diagnosis not present

## 2013-12-09 DIAGNOSIS — N2581 Secondary hyperparathyroidism of renal origin: Secondary | ICD-10-CM | POA: Diagnosis not present

## 2013-12-09 DIAGNOSIS — N186 End stage renal disease: Secondary | ICD-10-CM | POA: Diagnosis not present

## 2013-12-11 DIAGNOSIS — N2581 Secondary hyperparathyroidism of renal origin: Secondary | ICD-10-CM | POA: Diagnosis not present

## 2013-12-11 DIAGNOSIS — N186 End stage renal disease: Secondary | ICD-10-CM | POA: Diagnosis not present

## 2013-12-11 DIAGNOSIS — E1129 Type 2 diabetes mellitus with other diabetic kidney complication: Secondary | ICD-10-CM | POA: Diagnosis not present

## 2013-12-14 DIAGNOSIS — E1129 Type 2 diabetes mellitus with other diabetic kidney complication: Secondary | ICD-10-CM | POA: Diagnosis not present

## 2013-12-14 DIAGNOSIS — N186 End stage renal disease: Secondary | ICD-10-CM | POA: Diagnosis not present

## 2013-12-14 DIAGNOSIS — N2581 Secondary hyperparathyroidism of renal origin: Secondary | ICD-10-CM | POA: Diagnosis not present

## 2013-12-16 DIAGNOSIS — N2581 Secondary hyperparathyroidism of renal origin: Secondary | ICD-10-CM | POA: Diagnosis not present

## 2013-12-16 DIAGNOSIS — N186 End stage renal disease: Secondary | ICD-10-CM | POA: Diagnosis not present

## 2013-12-16 DIAGNOSIS — E1129 Type 2 diabetes mellitus with other diabetic kidney complication: Secondary | ICD-10-CM | POA: Diagnosis not present

## 2013-12-18 DIAGNOSIS — N2581 Secondary hyperparathyroidism of renal origin: Secondary | ICD-10-CM | POA: Diagnosis not present

## 2013-12-18 DIAGNOSIS — N186 End stage renal disease: Secondary | ICD-10-CM | POA: Diagnosis not present

## 2013-12-18 DIAGNOSIS — E1129 Type 2 diabetes mellitus with other diabetic kidney complication: Secondary | ICD-10-CM | POA: Diagnosis not present

## 2013-12-21 DIAGNOSIS — N2581 Secondary hyperparathyroidism of renal origin: Secondary | ICD-10-CM | POA: Diagnosis not present

## 2013-12-21 DIAGNOSIS — E1129 Type 2 diabetes mellitus with other diabetic kidney complication: Secondary | ICD-10-CM | POA: Diagnosis not present

## 2013-12-21 DIAGNOSIS — N186 End stage renal disease: Secondary | ICD-10-CM | POA: Diagnosis not present

## 2013-12-23 DIAGNOSIS — N2581 Secondary hyperparathyroidism of renal origin: Secondary | ICD-10-CM | POA: Diagnosis not present

## 2013-12-23 DIAGNOSIS — E1129 Type 2 diabetes mellitus with other diabetic kidney complication: Secondary | ICD-10-CM | POA: Diagnosis not present

## 2013-12-23 DIAGNOSIS — N186 End stage renal disease: Secondary | ICD-10-CM | POA: Diagnosis not present

## 2013-12-25 DIAGNOSIS — N186 End stage renal disease: Secondary | ICD-10-CM | POA: Diagnosis not present

## 2013-12-25 DIAGNOSIS — E1129 Type 2 diabetes mellitus with other diabetic kidney complication: Secondary | ICD-10-CM | POA: Diagnosis not present

## 2013-12-25 DIAGNOSIS — N2581 Secondary hyperparathyroidism of renal origin: Secondary | ICD-10-CM | POA: Diagnosis not present

## 2013-12-28 DIAGNOSIS — N186 End stage renal disease: Secondary | ICD-10-CM | POA: Diagnosis not present

## 2013-12-28 DIAGNOSIS — N2581 Secondary hyperparathyroidism of renal origin: Secondary | ICD-10-CM | POA: Diagnosis not present

## 2013-12-28 DIAGNOSIS — E1129 Type 2 diabetes mellitus with other diabetic kidney complication: Secondary | ICD-10-CM | POA: Diagnosis not present

## 2013-12-30 DIAGNOSIS — N2581 Secondary hyperparathyroidism of renal origin: Secondary | ICD-10-CM | POA: Diagnosis not present

## 2013-12-30 DIAGNOSIS — E1129 Type 2 diabetes mellitus with other diabetic kidney complication: Secondary | ICD-10-CM | POA: Diagnosis not present

## 2013-12-30 DIAGNOSIS — N186 End stage renal disease: Secondary | ICD-10-CM | POA: Diagnosis not present

## 2014-01-01 DIAGNOSIS — N2581 Secondary hyperparathyroidism of renal origin: Secondary | ICD-10-CM | POA: Diagnosis not present

## 2014-01-01 DIAGNOSIS — E1129 Type 2 diabetes mellitus with other diabetic kidney complication: Secondary | ICD-10-CM | POA: Diagnosis not present

## 2014-01-01 DIAGNOSIS — N186 End stage renal disease: Secondary | ICD-10-CM | POA: Diagnosis not present

## 2014-01-03 DIAGNOSIS — N186 End stage renal disease: Secondary | ICD-10-CM | POA: Diagnosis not present

## 2014-01-04 DIAGNOSIS — E1129 Type 2 diabetes mellitus with other diabetic kidney complication: Secondary | ICD-10-CM | POA: Diagnosis not present

## 2014-01-04 DIAGNOSIS — N186 End stage renal disease: Secondary | ICD-10-CM | POA: Diagnosis not present

## 2014-01-04 DIAGNOSIS — N2581 Secondary hyperparathyroidism of renal origin: Secondary | ICD-10-CM | POA: Diagnosis not present

## 2014-01-20 DIAGNOSIS — E1129 Type 2 diabetes mellitus with other diabetic kidney complication: Secondary | ICD-10-CM | POA: Diagnosis not present

## 2014-01-21 DIAGNOSIS — R52 Pain, unspecified: Secondary | ICD-10-CM | POA: Insufficient documentation

## 2014-01-21 DIAGNOSIS — T7840XS Allergy, unspecified, sequela: Secondary | ICD-10-CM | POA: Insufficient documentation

## 2014-01-21 DIAGNOSIS — R197 Diarrhea, unspecified: Secondary | ICD-10-CM | POA: Insufficient documentation

## 2014-02-02 DIAGNOSIS — N186 End stage renal disease: Secondary | ICD-10-CM | POA: Diagnosis not present

## 2014-02-03 DIAGNOSIS — N2581 Secondary hyperparathyroidism of renal origin: Secondary | ICD-10-CM | POA: Diagnosis not present

## 2014-02-03 DIAGNOSIS — Z23 Encounter for immunization: Secondary | ICD-10-CM | POA: Diagnosis not present

## 2014-02-03 DIAGNOSIS — D631 Anemia in chronic kidney disease: Secondary | ICD-10-CM | POA: Diagnosis not present

## 2014-02-03 DIAGNOSIS — N186 End stage renal disease: Secondary | ICD-10-CM | POA: Diagnosis not present

## 2014-02-03 DIAGNOSIS — E1129 Type 2 diabetes mellitus with other diabetic kidney complication: Secondary | ICD-10-CM | POA: Diagnosis not present

## 2014-02-14 DIAGNOSIS — E119 Type 2 diabetes mellitus without complications: Secondary | ICD-10-CM | POA: Diagnosis not present

## 2014-02-14 DIAGNOSIS — T859XXA Unspecified complication of internal prosthetic device, implant and graft, initial encounter: Secondary | ICD-10-CM | POA: Diagnosis not present

## 2014-02-14 DIAGNOSIS — N186 End stage renal disease: Secondary | ICD-10-CM | POA: Diagnosis not present

## 2014-02-16 DIAGNOSIS — N186 End stage renal disease: Secondary | ICD-10-CM | POA: Diagnosis not present

## 2014-02-16 DIAGNOSIS — Z992 Dependence on renal dialysis: Secondary | ICD-10-CM | POA: Diagnosis not present

## 2014-02-16 DIAGNOSIS — I25110 Atherosclerotic heart disease of native coronary artery with unstable angina pectoris: Secondary | ICD-10-CM | POA: Diagnosis not present

## 2014-02-16 DIAGNOSIS — R600 Localized edema: Secondary | ICD-10-CM | POA: Diagnosis not present

## 2014-02-16 DIAGNOSIS — Z951 Presence of aortocoronary bypass graft: Secondary | ICD-10-CM | POA: Diagnosis not present

## 2014-02-16 DIAGNOSIS — Z7682 Awaiting organ transplant status: Secondary | ICD-10-CM | POA: Diagnosis not present

## 2014-02-16 DIAGNOSIS — I771 Stricture of artery: Secondary | ICD-10-CM | POA: Diagnosis not present

## 2014-02-16 DIAGNOSIS — E1122 Type 2 diabetes mellitus with diabetic chronic kidney disease: Secondary | ICD-10-CM | POA: Diagnosis not present

## 2014-02-16 DIAGNOSIS — Z905 Acquired absence of kidney: Secondary | ICD-10-CM | POA: Diagnosis not present

## 2014-02-16 DIAGNOSIS — I120 Hypertensive chronic kidney disease with stage 5 chronic kidney disease or end stage renal disease: Secondary | ICD-10-CM | POA: Diagnosis not present

## 2014-02-16 DIAGNOSIS — I2510 Atherosclerotic heart disease of native coronary artery without angina pectoris: Secondary | ICD-10-CM | POA: Diagnosis not present

## 2014-02-16 DIAGNOSIS — Z85528 Personal history of other malignant neoplasm of kidney: Secondary | ICD-10-CM | POA: Diagnosis not present

## 2014-02-16 DIAGNOSIS — Z7982 Long term (current) use of aspirin: Secondary | ICD-10-CM | POA: Diagnosis not present

## 2014-02-16 DIAGNOSIS — I2582 Chronic total occlusion of coronary artery: Secondary | ICD-10-CM | POA: Diagnosis not present

## 2014-02-16 DIAGNOSIS — Z01810 Encounter for preprocedural cardiovascular examination: Secondary | ICD-10-CM | POA: Diagnosis not present

## 2014-02-16 DIAGNOSIS — R079 Chest pain, unspecified: Secondary | ICD-10-CM | POA: Diagnosis not present

## 2014-02-23 DIAGNOSIS — I120 Hypertensive chronic kidney disease with stage 5 chronic kidney disease or end stage renal disease: Secondary | ICD-10-CM | POA: Diagnosis not present

## 2014-02-23 DIAGNOSIS — E119 Type 2 diabetes mellitus without complications: Secondary | ICD-10-CM | POA: Diagnosis not present

## 2014-02-23 DIAGNOSIS — M160 Bilateral primary osteoarthritis of hip: Secondary | ICD-10-CM | POA: Diagnosis not present

## 2014-02-23 DIAGNOSIS — I10 Essential (primary) hypertension: Secondary | ICD-10-CM | POA: Diagnosis not present

## 2014-02-23 DIAGNOSIS — Z992 Dependence on renal dialysis: Secondary | ICD-10-CM | POA: Diagnosis not present

## 2014-02-23 DIAGNOSIS — N186 End stage renal disease: Secondary | ICD-10-CM | POA: Diagnosis not present

## 2014-02-23 DIAGNOSIS — M1611 Unilateral primary osteoarthritis, right hip: Secondary | ICD-10-CM | POA: Diagnosis not present

## 2014-02-25 ENCOUNTER — Ambulatory Visit: Payer: Self-pay | Admitting: Vascular Surgery

## 2014-02-25 DIAGNOSIS — E119 Type 2 diabetes mellitus without complications: Secondary | ICD-10-CM | POA: Diagnosis not present

## 2014-02-25 DIAGNOSIS — T8241XA Breakdown (mechanical) of vascular dialysis catheter, initial encounter: Secondary | ICD-10-CM | POA: Diagnosis not present

## 2014-02-25 DIAGNOSIS — Z91041 Radiographic dye allergy status: Secondary | ICD-10-CM | POA: Diagnosis not present

## 2014-02-25 DIAGNOSIS — Z9889 Other specified postprocedural states: Secondary | ICD-10-CM | POA: Diagnosis not present

## 2014-02-25 DIAGNOSIS — Z885 Allergy status to narcotic agent status: Secondary | ICD-10-CM | POA: Diagnosis not present

## 2014-02-25 DIAGNOSIS — Z992 Dependence on renal dialysis: Secondary | ICD-10-CM | POA: Diagnosis not present

## 2014-02-25 DIAGNOSIS — Z7982 Long term (current) use of aspirin: Secondary | ICD-10-CM | POA: Diagnosis not present

## 2014-02-25 DIAGNOSIS — Z79899 Other long term (current) drug therapy: Secondary | ICD-10-CM | POA: Diagnosis not present

## 2014-02-25 DIAGNOSIS — N186 End stage renal disease: Secondary | ICD-10-CM | POA: Diagnosis not present

## 2014-02-25 DIAGNOSIS — M79609 Pain in unspecified limb: Secondary | ICD-10-CM | POA: Diagnosis not present

## 2014-02-26 HISTORY — PX: LEFT HEART CATH AND CORS/GRAFTS ANGIOGRAPHY: CATH118250

## 2014-02-26 HISTORY — PX: CARDIAC CATHETERIZATION: SHX172

## 2014-03-05 DIAGNOSIS — N186 End stage renal disease: Secondary | ICD-10-CM | POA: Diagnosis not present

## 2014-03-05 DIAGNOSIS — Z992 Dependence on renal dialysis: Secondary | ICD-10-CM | POA: Diagnosis not present

## 2014-03-05 DIAGNOSIS — E1122 Type 2 diabetes mellitus with diabetic chronic kidney disease: Secondary | ICD-10-CM | POA: Diagnosis not present

## 2014-03-08 DIAGNOSIS — N186 End stage renal disease: Secondary | ICD-10-CM | POA: Diagnosis not present

## 2014-03-08 DIAGNOSIS — E1129 Type 2 diabetes mellitus with other diabetic kidney complication: Secondary | ICD-10-CM | POA: Diagnosis not present

## 2014-03-08 DIAGNOSIS — N2581 Secondary hyperparathyroidism of renal origin: Secondary | ICD-10-CM | POA: Diagnosis not present

## 2014-03-08 DIAGNOSIS — D631 Anemia in chronic kidney disease: Secondary | ICD-10-CM | POA: Diagnosis not present

## 2014-03-16 DIAGNOSIS — E119 Type 2 diabetes mellitus without complications: Secondary | ICD-10-CM | POA: Diagnosis not present

## 2014-03-16 DIAGNOSIS — B351 Tinea unguium: Secondary | ICD-10-CM | POA: Diagnosis not present

## 2014-03-16 DIAGNOSIS — M79671 Pain in right foot: Secondary | ICD-10-CM | POA: Diagnosis not present

## 2014-03-16 DIAGNOSIS — M79672 Pain in left foot: Secondary | ICD-10-CM | POA: Diagnosis not present

## 2014-03-21 IMAGING — XA IR VASCULAR PROCEDURE
12 of 14 series · 15 of 24 positions shown · IV contrast (IODINE)
Comparison: none

[Series 1: care upper arm · 1 of 2 slices shown (1 of 9)]
[im 1/2]
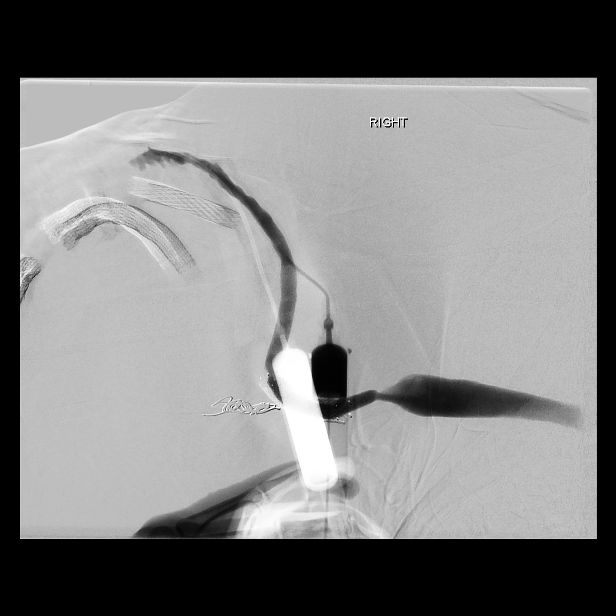

[Series 3: fl - angio · 1 of 2 slices shown (1 of 3)]
[im 1/2]
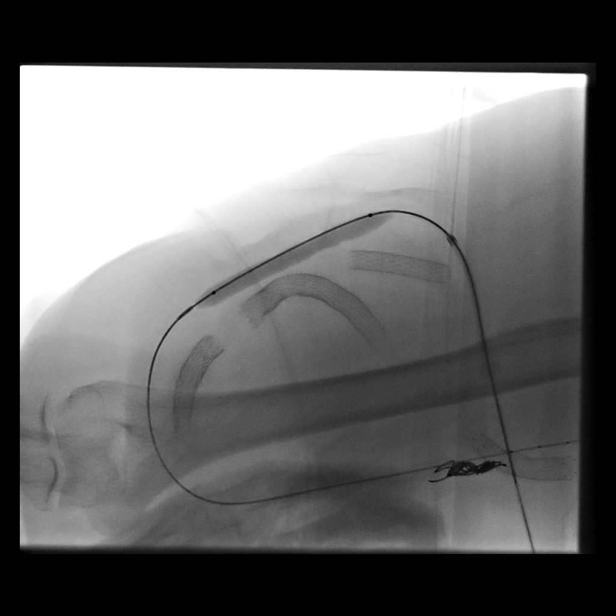

[Series 5: care upper arm · 2 of 2 slices shown (2 of 9)]
[im 1/2]
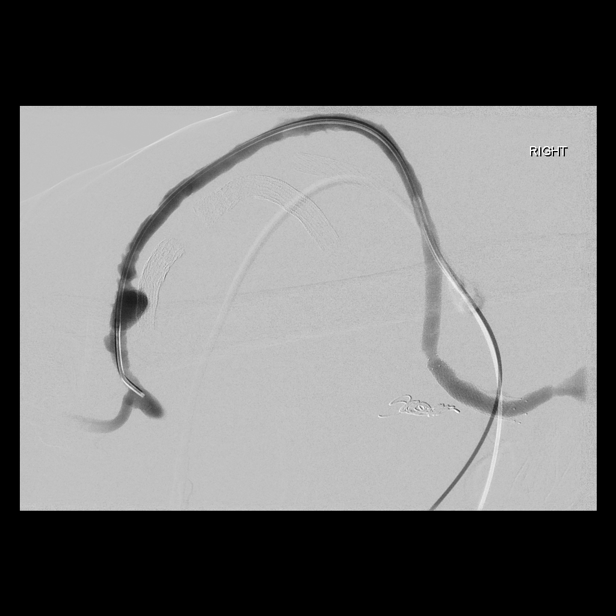
[im 2/2]
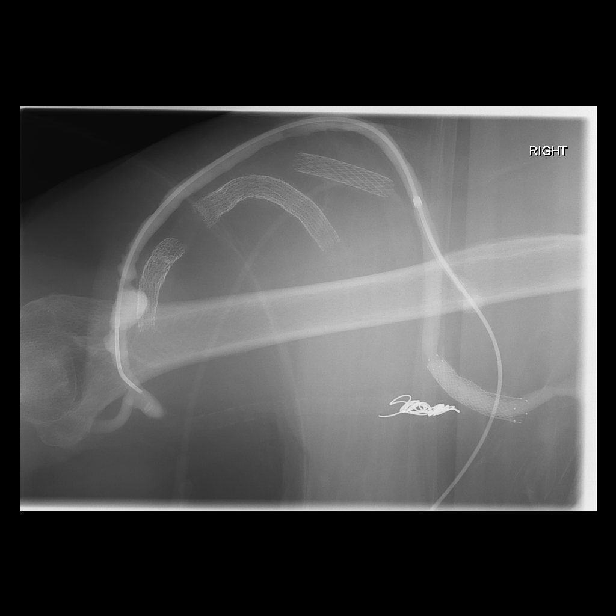

[Series 6: care upper arm · 1 of 2 slices shown (3 of 9)]
[im 2/2]
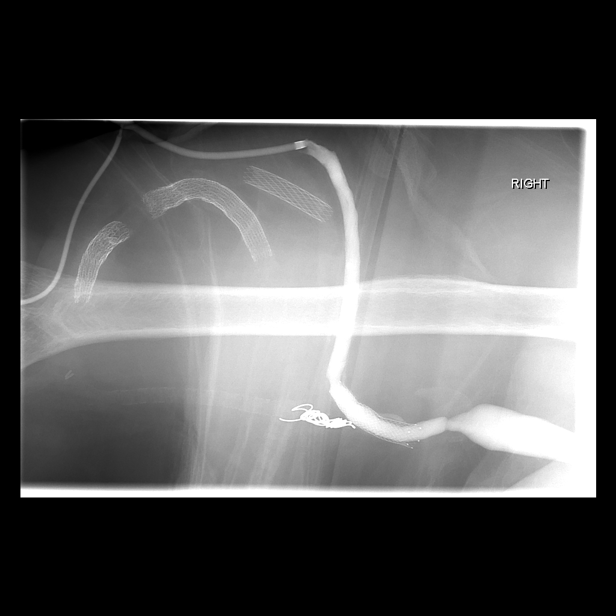

[Series 7: fl - angio · 1 of 2 slices shown (2 of 3)]
[im 1/2]
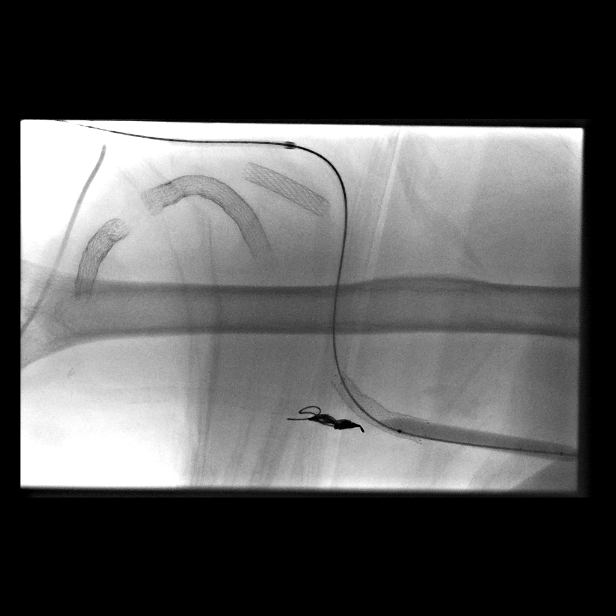

[Series 8: care upper arm · 1 of 2 slices shown (4 of 9)]
[im 1/2]
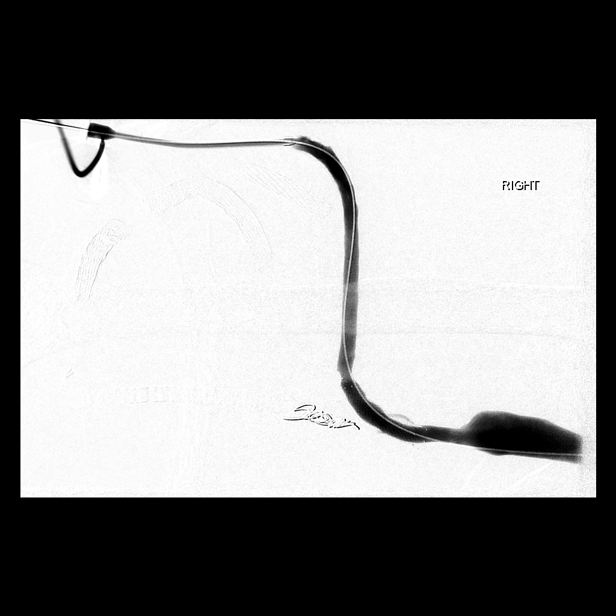

[Series 9: care upper arm · 2 of 2 slices shown (5 of 9)]
[im 1/2]
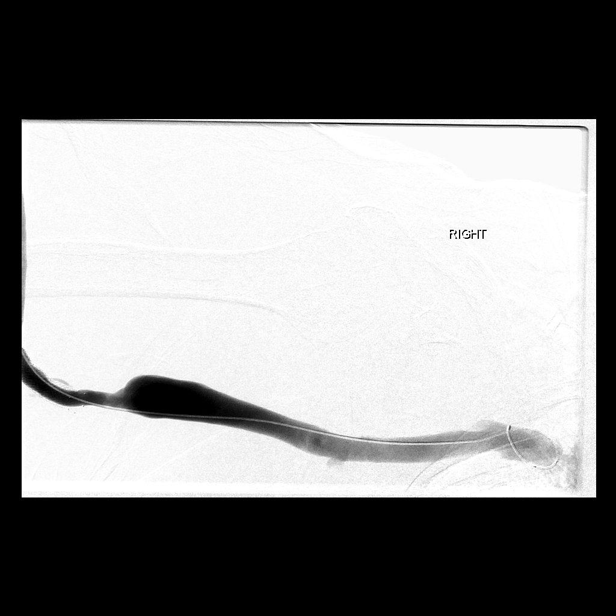
[im 2/2]
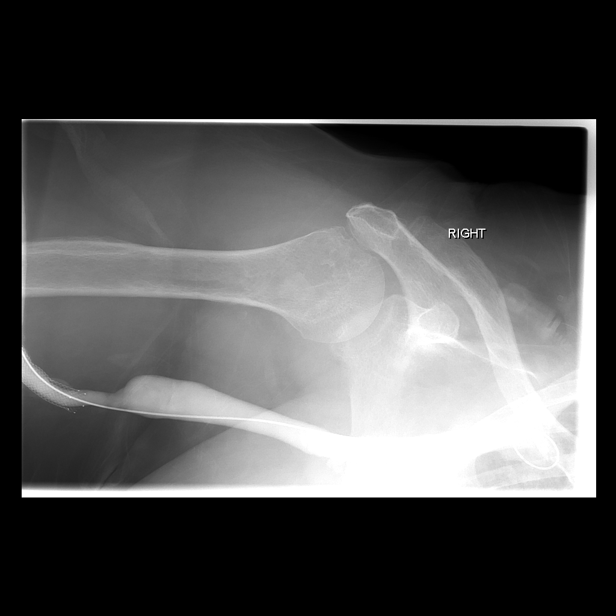

[Series 10: care upper arm · 1 of 2 slices shown (6 of 9)]
[im 2/2]
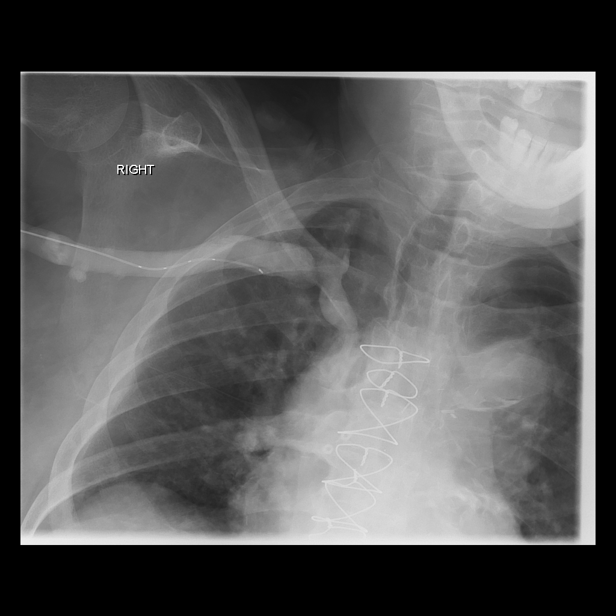

[Series 11: care upper arm · 1 of 2 slices shown (7 of 9)]
[im 1/2]
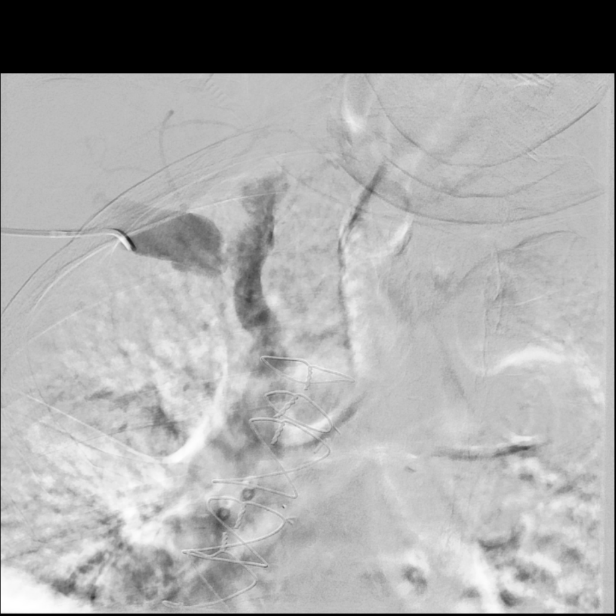

[Series 12: care upper arm · 1 of 2 slices shown (8 of 9)]
[im 1/2]
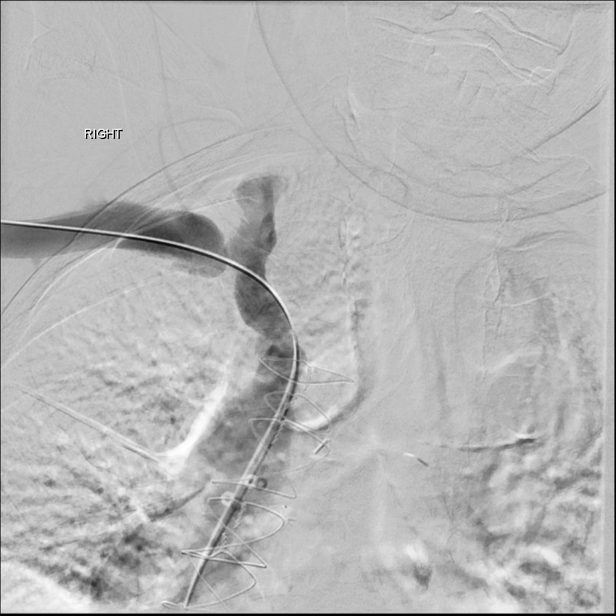

[Series 13: fl - angio · 2 of 2 slices shown (3 of 3)]
[im 1/2]
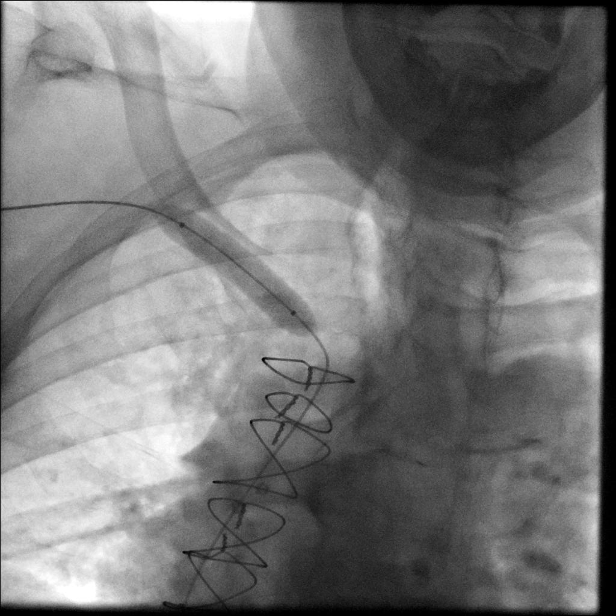
[im 2/2]
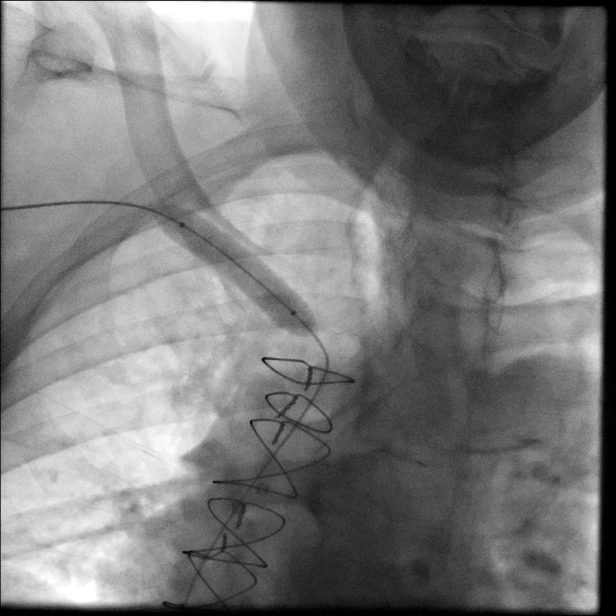

[Series 14: care upper arm · 1 of 2 slices shown (9 of 9)]
[im 2/2]
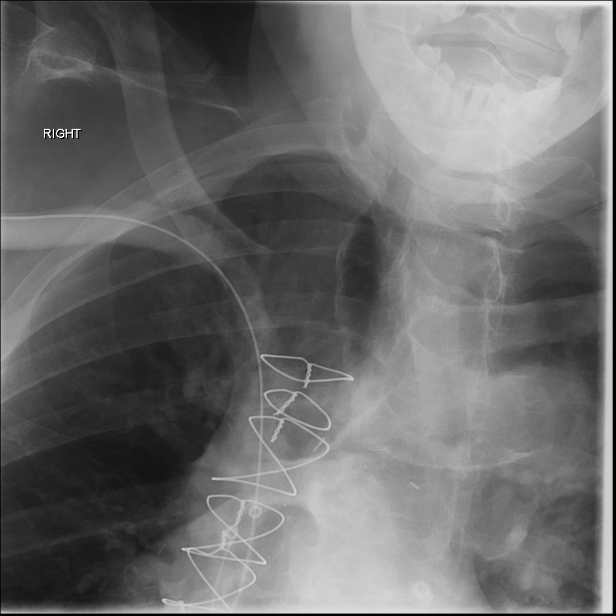

[15 of 24 positions shown; findings below may reference images not displayed]

IMAGES IMPORTED FROM THE [employer] WORKFLOW SYSTEM
NO DICTATION FOR STUDY

## 2014-04-04 DIAGNOSIS — Y841 Kidney dialysis as the cause of abnormal reaction of the patient, or of later complication, without mention of misadventure at the time of the procedure: Secondary | ICD-10-CM | POA: Diagnosis not present

## 2014-04-04 DIAGNOSIS — E1122 Type 2 diabetes mellitus with diabetic chronic kidney disease: Secondary | ICD-10-CM | POA: Diagnosis not present

## 2014-04-04 DIAGNOSIS — N186 End stage renal disease: Secondary | ICD-10-CM | POA: Diagnosis not present

## 2014-04-04 DIAGNOSIS — Z992 Dependence on renal dialysis: Secondary | ICD-10-CM | POA: Diagnosis not present

## 2014-04-04 DIAGNOSIS — M79609 Pain in unspecified limb: Secondary | ICD-10-CM | POA: Diagnosis not present

## 2014-04-04 DIAGNOSIS — I10 Essential (primary) hypertension: Secondary | ICD-10-CM | POA: Diagnosis not present

## 2014-04-05 DIAGNOSIS — E1129 Type 2 diabetes mellitus with other diabetic kidney complication: Secondary | ICD-10-CM | POA: Diagnosis not present

## 2014-04-05 DIAGNOSIS — D631 Anemia in chronic kidney disease: Secondary | ICD-10-CM | POA: Diagnosis not present

## 2014-04-05 DIAGNOSIS — N2581 Secondary hyperparathyroidism of renal origin: Secondary | ICD-10-CM | POA: Diagnosis not present

## 2014-04-05 DIAGNOSIS — N186 End stage renal disease: Secondary | ICD-10-CM | POA: Diagnosis not present

## 2014-04-05 DIAGNOSIS — D509 Iron deficiency anemia, unspecified: Secondary | ICD-10-CM | POA: Diagnosis not present

## 2014-04-07 DIAGNOSIS — N186 End stage renal disease: Secondary | ICD-10-CM | POA: Diagnosis not present

## 2014-04-07 DIAGNOSIS — D509 Iron deficiency anemia, unspecified: Secondary | ICD-10-CM | POA: Diagnosis not present

## 2014-04-07 DIAGNOSIS — E1129 Type 2 diabetes mellitus with other diabetic kidney complication: Secondary | ICD-10-CM | POA: Diagnosis not present

## 2014-04-07 DIAGNOSIS — D631 Anemia in chronic kidney disease: Secondary | ICD-10-CM | POA: Diagnosis not present

## 2014-04-07 DIAGNOSIS — G5601 Carpal tunnel syndrome, right upper limb: Secondary | ICD-10-CM | POA: Insufficient documentation

## 2014-04-07 DIAGNOSIS — N2581 Secondary hyperparathyroidism of renal origin: Secondary | ICD-10-CM | POA: Diagnosis not present

## 2014-04-09 DIAGNOSIS — N186 End stage renal disease: Secondary | ICD-10-CM | POA: Diagnosis not present

## 2014-04-09 DIAGNOSIS — D509 Iron deficiency anemia, unspecified: Secondary | ICD-10-CM | POA: Diagnosis not present

## 2014-04-09 DIAGNOSIS — E1129 Type 2 diabetes mellitus with other diabetic kidney complication: Secondary | ICD-10-CM | POA: Diagnosis not present

## 2014-04-09 DIAGNOSIS — N2581 Secondary hyperparathyroidism of renal origin: Secondary | ICD-10-CM | POA: Diagnosis not present

## 2014-04-09 DIAGNOSIS — D631 Anemia in chronic kidney disease: Secondary | ICD-10-CM | POA: Diagnosis not present

## 2014-04-12 DIAGNOSIS — N186 End stage renal disease: Secondary | ICD-10-CM | POA: Diagnosis not present

## 2014-04-12 DIAGNOSIS — D509 Iron deficiency anemia, unspecified: Secondary | ICD-10-CM | POA: Diagnosis not present

## 2014-04-12 DIAGNOSIS — D631 Anemia in chronic kidney disease: Secondary | ICD-10-CM | POA: Diagnosis not present

## 2014-04-12 DIAGNOSIS — E1129 Type 2 diabetes mellitus with other diabetic kidney complication: Secondary | ICD-10-CM | POA: Diagnosis not present

## 2014-04-12 DIAGNOSIS — N2581 Secondary hyperparathyroidism of renal origin: Secondary | ICD-10-CM | POA: Diagnosis not present

## 2014-04-14 DIAGNOSIS — D631 Anemia in chronic kidney disease: Secondary | ICD-10-CM | POA: Diagnosis not present

## 2014-04-14 DIAGNOSIS — N186 End stage renal disease: Secondary | ICD-10-CM | POA: Diagnosis not present

## 2014-04-14 DIAGNOSIS — N2581 Secondary hyperparathyroidism of renal origin: Secondary | ICD-10-CM | POA: Diagnosis not present

## 2014-04-14 DIAGNOSIS — E1129 Type 2 diabetes mellitus with other diabetic kidney complication: Secondary | ICD-10-CM | POA: Diagnosis not present

## 2014-04-14 DIAGNOSIS — D509 Iron deficiency anemia, unspecified: Secondary | ICD-10-CM | POA: Diagnosis not present

## 2014-04-16 DIAGNOSIS — N2581 Secondary hyperparathyroidism of renal origin: Secondary | ICD-10-CM | POA: Diagnosis not present

## 2014-04-16 DIAGNOSIS — N186 End stage renal disease: Secondary | ICD-10-CM | POA: Diagnosis not present

## 2014-04-16 DIAGNOSIS — D509 Iron deficiency anemia, unspecified: Secondary | ICD-10-CM | POA: Diagnosis not present

## 2014-04-16 DIAGNOSIS — E1129 Type 2 diabetes mellitus with other diabetic kidney complication: Secondary | ICD-10-CM | POA: Diagnosis not present

## 2014-04-16 DIAGNOSIS — D631 Anemia in chronic kidney disease: Secondary | ICD-10-CM | POA: Diagnosis not present

## 2014-04-19 DIAGNOSIS — N186 End stage renal disease: Secondary | ICD-10-CM | POA: Diagnosis not present

## 2014-04-19 DIAGNOSIS — N2581 Secondary hyperparathyroidism of renal origin: Secondary | ICD-10-CM | POA: Diagnosis not present

## 2014-04-19 DIAGNOSIS — E1129 Type 2 diabetes mellitus with other diabetic kidney complication: Secondary | ICD-10-CM | POA: Diagnosis not present

## 2014-04-19 DIAGNOSIS — D509 Iron deficiency anemia, unspecified: Secondary | ICD-10-CM | POA: Diagnosis not present

## 2014-04-19 DIAGNOSIS — D631 Anemia in chronic kidney disease: Secondary | ICD-10-CM | POA: Diagnosis not present

## 2014-04-21 DIAGNOSIS — D509 Iron deficiency anemia, unspecified: Secondary | ICD-10-CM | POA: Diagnosis not present

## 2014-04-21 DIAGNOSIS — E1129 Type 2 diabetes mellitus with other diabetic kidney complication: Secondary | ICD-10-CM | POA: Diagnosis not present

## 2014-04-21 DIAGNOSIS — N2581 Secondary hyperparathyroidism of renal origin: Secondary | ICD-10-CM | POA: Diagnosis not present

## 2014-04-21 DIAGNOSIS — N186 End stage renal disease: Secondary | ICD-10-CM | POA: Diagnosis not present

## 2014-04-21 DIAGNOSIS — D631 Anemia in chronic kidney disease: Secondary | ICD-10-CM | POA: Diagnosis not present

## 2014-04-23 DIAGNOSIS — D631 Anemia in chronic kidney disease: Secondary | ICD-10-CM | POA: Diagnosis not present

## 2014-04-23 DIAGNOSIS — N186 End stage renal disease: Secondary | ICD-10-CM | POA: Diagnosis not present

## 2014-04-23 DIAGNOSIS — N2581 Secondary hyperparathyroidism of renal origin: Secondary | ICD-10-CM | POA: Diagnosis not present

## 2014-04-23 DIAGNOSIS — E1129 Type 2 diabetes mellitus with other diabetic kidney complication: Secondary | ICD-10-CM | POA: Diagnosis not present

## 2014-04-23 DIAGNOSIS — D509 Iron deficiency anemia, unspecified: Secondary | ICD-10-CM | POA: Diagnosis not present

## 2014-04-25 DIAGNOSIS — D509 Iron deficiency anemia, unspecified: Secondary | ICD-10-CM | POA: Insufficient documentation

## 2014-04-26 DIAGNOSIS — N186 End stage renal disease: Secondary | ICD-10-CM | POA: Diagnosis not present

## 2014-04-26 DIAGNOSIS — E1129 Type 2 diabetes mellitus with other diabetic kidney complication: Secondary | ICD-10-CM | POA: Diagnosis not present

## 2014-04-26 DIAGNOSIS — D509 Iron deficiency anemia, unspecified: Secondary | ICD-10-CM | POA: Diagnosis not present

## 2014-04-26 DIAGNOSIS — D631 Anemia in chronic kidney disease: Secondary | ICD-10-CM | POA: Diagnosis not present

## 2014-04-26 DIAGNOSIS — N2581 Secondary hyperparathyroidism of renal origin: Secondary | ICD-10-CM | POA: Diagnosis not present

## 2014-04-28 DIAGNOSIS — D631 Anemia in chronic kidney disease: Secondary | ICD-10-CM | POA: Diagnosis not present

## 2014-04-28 DIAGNOSIS — D509 Iron deficiency anemia, unspecified: Secondary | ICD-10-CM | POA: Diagnosis not present

## 2014-04-28 DIAGNOSIS — N186 End stage renal disease: Secondary | ICD-10-CM | POA: Diagnosis not present

## 2014-04-28 DIAGNOSIS — E1129 Type 2 diabetes mellitus with other diabetic kidney complication: Secondary | ICD-10-CM | POA: Diagnosis not present

## 2014-04-28 DIAGNOSIS — N2581 Secondary hyperparathyroidism of renal origin: Secondary | ICD-10-CM | POA: Diagnosis not present

## 2014-05-01 DIAGNOSIS — D509 Iron deficiency anemia, unspecified: Secondary | ICD-10-CM | POA: Diagnosis not present

## 2014-05-01 DIAGNOSIS — E1129 Type 2 diabetes mellitus with other diabetic kidney complication: Secondary | ICD-10-CM | POA: Diagnosis not present

## 2014-05-01 DIAGNOSIS — N186 End stage renal disease: Secondary | ICD-10-CM | POA: Diagnosis not present

## 2014-05-01 DIAGNOSIS — D631 Anemia in chronic kidney disease: Secondary | ICD-10-CM | POA: Diagnosis not present

## 2014-05-01 DIAGNOSIS — N2581 Secondary hyperparathyroidism of renal origin: Secondary | ICD-10-CM | POA: Diagnosis not present

## 2014-05-03 DIAGNOSIS — D631 Anemia in chronic kidney disease: Secondary | ICD-10-CM | POA: Diagnosis not present

## 2014-05-03 DIAGNOSIS — N2581 Secondary hyperparathyroidism of renal origin: Secondary | ICD-10-CM | POA: Diagnosis not present

## 2014-05-03 DIAGNOSIS — E1129 Type 2 diabetes mellitus with other diabetic kidney complication: Secondary | ICD-10-CM | POA: Diagnosis not present

## 2014-05-03 DIAGNOSIS — N186 End stage renal disease: Secondary | ICD-10-CM | POA: Diagnosis not present

## 2014-05-03 DIAGNOSIS — D509 Iron deficiency anemia, unspecified: Secondary | ICD-10-CM | POA: Diagnosis not present

## 2014-05-05 DIAGNOSIS — D631 Anemia in chronic kidney disease: Secondary | ICD-10-CM | POA: Diagnosis not present

## 2014-05-05 DIAGNOSIS — N2581 Secondary hyperparathyroidism of renal origin: Secondary | ICD-10-CM | POA: Diagnosis not present

## 2014-05-05 DIAGNOSIS — D509 Iron deficiency anemia, unspecified: Secondary | ICD-10-CM | POA: Diagnosis not present

## 2014-05-05 DIAGNOSIS — N186 End stage renal disease: Secondary | ICD-10-CM | POA: Diagnosis not present

## 2014-05-05 DIAGNOSIS — Z992 Dependence on renal dialysis: Secondary | ICD-10-CM | POA: Diagnosis not present

## 2014-05-05 DIAGNOSIS — E1129 Type 2 diabetes mellitus with other diabetic kidney complication: Secondary | ICD-10-CM | POA: Diagnosis not present

## 2014-05-05 DIAGNOSIS — E1122 Type 2 diabetes mellitus with diabetic chronic kidney disease: Secondary | ICD-10-CM | POA: Diagnosis not present

## 2014-05-08 DIAGNOSIS — N2581 Secondary hyperparathyroidism of renal origin: Secondary | ICD-10-CM | POA: Diagnosis not present

## 2014-05-08 DIAGNOSIS — N186 End stage renal disease: Secondary | ICD-10-CM | POA: Diagnosis not present

## 2014-05-08 DIAGNOSIS — D509 Iron deficiency anemia, unspecified: Secondary | ICD-10-CM | POA: Diagnosis not present

## 2014-05-08 DIAGNOSIS — E1129 Type 2 diabetes mellitus with other diabetic kidney complication: Secondary | ICD-10-CM | POA: Diagnosis not present

## 2014-05-10 DIAGNOSIS — D509 Iron deficiency anemia, unspecified: Secondary | ICD-10-CM | POA: Diagnosis not present

## 2014-05-10 DIAGNOSIS — N2581 Secondary hyperparathyroidism of renal origin: Secondary | ICD-10-CM | POA: Diagnosis not present

## 2014-05-10 DIAGNOSIS — E1129 Type 2 diabetes mellitus with other diabetic kidney complication: Secondary | ICD-10-CM | POA: Diagnosis not present

## 2014-05-10 DIAGNOSIS — N186 End stage renal disease: Secondary | ICD-10-CM | POA: Diagnosis not present

## 2014-05-12 DIAGNOSIS — N2581 Secondary hyperparathyroidism of renal origin: Secondary | ICD-10-CM | POA: Diagnosis not present

## 2014-05-12 DIAGNOSIS — N186 End stage renal disease: Secondary | ICD-10-CM | POA: Diagnosis not present

## 2014-05-12 DIAGNOSIS — D509 Iron deficiency anemia, unspecified: Secondary | ICD-10-CM | POA: Diagnosis not present

## 2014-05-12 DIAGNOSIS — E1129 Type 2 diabetes mellitus with other diabetic kidney complication: Secondary | ICD-10-CM | POA: Diagnosis not present

## 2014-05-13 DIAGNOSIS — I2510 Atherosclerotic heart disease of native coronary artery without angina pectoris: Secondary | ICD-10-CM | POA: Diagnosis not present

## 2014-05-13 DIAGNOSIS — I519 Heart disease, unspecified: Secondary | ICD-10-CM | POA: Diagnosis not present

## 2014-05-13 DIAGNOSIS — I10 Essential (primary) hypertension: Secondary | ICD-10-CM | POA: Diagnosis not present

## 2014-05-14 DIAGNOSIS — D509 Iron deficiency anemia, unspecified: Secondary | ICD-10-CM | POA: Diagnosis not present

## 2014-05-14 DIAGNOSIS — N2581 Secondary hyperparathyroidism of renal origin: Secondary | ICD-10-CM | POA: Diagnosis not present

## 2014-05-14 DIAGNOSIS — N186 End stage renal disease: Secondary | ICD-10-CM | POA: Diagnosis not present

## 2014-05-14 DIAGNOSIS — E1129 Type 2 diabetes mellitus with other diabetic kidney complication: Secondary | ICD-10-CM | POA: Diagnosis not present

## 2014-05-17 DIAGNOSIS — N2581 Secondary hyperparathyroidism of renal origin: Secondary | ICD-10-CM | POA: Diagnosis not present

## 2014-05-17 DIAGNOSIS — D509 Iron deficiency anemia, unspecified: Secondary | ICD-10-CM | POA: Diagnosis not present

## 2014-05-17 DIAGNOSIS — N186 End stage renal disease: Secondary | ICD-10-CM | POA: Diagnosis not present

## 2014-05-17 DIAGNOSIS — E1129 Type 2 diabetes mellitus with other diabetic kidney complication: Secondary | ICD-10-CM | POA: Diagnosis not present

## 2014-05-19 DIAGNOSIS — N186 End stage renal disease: Secondary | ICD-10-CM | POA: Diagnosis not present

## 2014-05-19 DIAGNOSIS — N2581 Secondary hyperparathyroidism of renal origin: Secondary | ICD-10-CM | POA: Diagnosis not present

## 2014-05-19 DIAGNOSIS — E1129 Type 2 diabetes mellitus with other diabetic kidney complication: Secondary | ICD-10-CM | POA: Diagnosis not present

## 2014-05-19 DIAGNOSIS — D509 Iron deficiency anemia, unspecified: Secondary | ICD-10-CM | POA: Diagnosis not present

## 2014-05-21 DIAGNOSIS — D509 Iron deficiency anemia, unspecified: Secondary | ICD-10-CM | POA: Diagnosis not present

## 2014-05-21 DIAGNOSIS — N186 End stage renal disease: Secondary | ICD-10-CM | POA: Diagnosis not present

## 2014-05-21 DIAGNOSIS — E1129 Type 2 diabetes mellitus with other diabetic kidney complication: Secondary | ICD-10-CM | POA: Diagnosis not present

## 2014-05-21 DIAGNOSIS — N2581 Secondary hyperparathyroidism of renal origin: Secondary | ICD-10-CM | POA: Diagnosis not present

## 2014-05-23 ENCOUNTER — Ambulatory Visit: Payer: Self-pay | Admitting: Vascular Surgery

## 2014-05-23 DIAGNOSIS — M79641 Pain in right hand: Secondary | ICD-10-CM | POA: Diagnosis not present

## 2014-05-23 DIAGNOSIS — I739 Peripheral vascular disease, unspecified: Secondary | ICD-10-CM | POA: Diagnosis not present

## 2014-05-23 DIAGNOSIS — M1990 Unspecified osteoarthritis, unspecified site: Secondary | ICD-10-CM | POA: Diagnosis not present

## 2014-05-23 DIAGNOSIS — M542 Cervicalgia: Secondary | ICD-10-CM | POA: Diagnosis not present

## 2014-05-23 DIAGNOSIS — M5032 Other cervical disc degeneration, mid-cervical region: Secondary | ICD-10-CM | POA: Diagnosis not present

## 2014-05-23 DIAGNOSIS — M79609 Pain in unspecified limb: Secondary | ICD-10-CM | POA: Diagnosis not present

## 2014-05-23 DIAGNOSIS — M79642 Pain in left hand: Secondary | ICD-10-CM | POA: Diagnosis not present

## 2014-05-23 DIAGNOSIS — N186 End stage renal disease: Secondary | ICD-10-CM | POA: Diagnosis not present

## 2014-05-24 DIAGNOSIS — D509 Iron deficiency anemia, unspecified: Secondary | ICD-10-CM | POA: Diagnosis not present

## 2014-05-24 DIAGNOSIS — N2581 Secondary hyperparathyroidism of renal origin: Secondary | ICD-10-CM | POA: Diagnosis not present

## 2014-05-24 DIAGNOSIS — E1129 Type 2 diabetes mellitus with other diabetic kidney complication: Secondary | ICD-10-CM | POA: Diagnosis not present

## 2014-05-24 DIAGNOSIS — N186 End stage renal disease: Secondary | ICD-10-CM | POA: Diagnosis not present

## 2014-05-25 DIAGNOSIS — I519 Heart disease, unspecified: Secondary | ICD-10-CM | POA: Insufficient documentation

## 2014-05-26 DIAGNOSIS — N2581 Secondary hyperparathyroidism of renal origin: Secondary | ICD-10-CM | POA: Diagnosis not present

## 2014-05-26 DIAGNOSIS — E1129 Type 2 diabetes mellitus with other diabetic kidney complication: Secondary | ICD-10-CM | POA: Diagnosis not present

## 2014-05-26 DIAGNOSIS — D509 Iron deficiency anemia, unspecified: Secondary | ICD-10-CM | POA: Diagnosis not present

## 2014-05-26 DIAGNOSIS — N186 End stage renal disease: Secondary | ICD-10-CM | POA: Diagnosis not present

## 2014-05-28 DIAGNOSIS — N186 End stage renal disease: Secondary | ICD-10-CM | POA: Diagnosis not present

## 2014-05-28 DIAGNOSIS — E1129 Type 2 diabetes mellitus with other diabetic kidney complication: Secondary | ICD-10-CM | POA: Diagnosis not present

## 2014-05-28 DIAGNOSIS — N2581 Secondary hyperparathyroidism of renal origin: Secondary | ICD-10-CM | POA: Diagnosis not present

## 2014-05-28 DIAGNOSIS — D509 Iron deficiency anemia, unspecified: Secondary | ICD-10-CM | POA: Diagnosis not present

## 2014-05-31 DIAGNOSIS — N186 End stage renal disease: Secondary | ICD-10-CM | POA: Diagnosis not present

## 2014-05-31 DIAGNOSIS — N2581 Secondary hyperparathyroidism of renal origin: Secondary | ICD-10-CM | POA: Diagnosis not present

## 2014-05-31 DIAGNOSIS — E1129 Type 2 diabetes mellitus with other diabetic kidney complication: Secondary | ICD-10-CM | POA: Diagnosis not present

## 2014-05-31 DIAGNOSIS — D509 Iron deficiency anemia, unspecified: Secondary | ICD-10-CM | POA: Diagnosis not present

## 2014-06-02 DIAGNOSIS — E1129 Type 2 diabetes mellitus with other diabetic kidney complication: Secondary | ICD-10-CM | POA: Diagnosis not present

## 2014-06-02 DIAGNOSIS — D509 Iron deficiency anemia, unspecified: Secondary | ICD-10-CM | POA: Diagnosis not present

## 2014-06-02 DIAGNOSIS — N2581 Secondary hyperparathyroidism of renal origin: Secondary | ICD-10-CM | POA: Diagnosis not present

## 2014-06-02 DIAGNOSIS — N186 End stage renal disease: Secondary | ICD-10-CM | POA: Diagnosis not present

## 2014-06-03 DIAGNOSIS — M542 Cervicalgia: Secondary | ICD-10-CM | POA: Diagnosis not present

## 2014-06-03 DIAGNOSIS — I739 Peripheral vascular disease, unspecified: Secondary | ICD-10-CM | POA: Diagnosis not present

## 2014-06-03 DIAGNOSIS — N186 End stage renal disease: Secondary | ICD-10-CM | POA: Diagnosis not present

## 2014-06-03 DIAGNOSIS — M1990 Unspecified osteoarthritis, unspecified site: Secondary | ICD-10-CM | POA: Diagnosis not present

## 2014-06-03 DIAGNOSIS — M79609 Pain in unspecified limb: Secondary | ICD-10-CM | POA: Diagnosis not present

## 2014-06-03 DIAGNOSIS — Y841 Kidney dialysis as the cause of abnormal reaction of the patient, or of later complication, without mention of misadventure at the time of the procedure: Secondary | ICD-10-CM | POA: Diagnosis not present

## 2014-06-04 DIAGNOSIS — E1129 Type 2 diabetes mellitus with other diabetic kidney complication: Secondary | ICD-10-CM | POA: Diagnosis not present

## 2014-06-04 DIAGNOSIS — D509 Iron deficiency anemia, unspecified: Secondary | ICD-10-CM | POA: Diagnosis not present

## 2014-06-04 DIAGNOSIS — N2581 Secondary hyperparathyroidism of renal origin: Secondary | ICD-10-CM | POA: Diagnosis not present

## 2014-06-04 DIAGNOSIS — N186 End stage renal disease: Secondary | ICD-10-CM | POA: Diagnosis not present

## 2014-06-05 DIAGNOSIS — N186 End stage renal disease: Secondary | ICD-10-CM | POA: Diagnosis not present

## 2014-06-05 DIAGNOSIS — Z992 Dependence on renal dialysis: Secondary | ICD-10-CM | POA: Diagnosis not present

## 2014-06-05 DIAGNOSIS — E1122 Type 2 diabetes mellitus with diabetic chronic kidney disease: Secondary | ICD-10-CM | POA: Diagnosis not present

## 2014-06-07 DIAGNOSIS — D509 Iron deficiency anemia, unspecified: Secondary | ICD-10-CM | POA: Diagnosis not present

## 2014-06-07 DIAGNOSIS — E1129 Type 2 diabetes mellitus with other diabetic kidney complication: Secondary | ICD-10-CM | POA: Diagnosis not present

## 2014-06-07 DIAGNOSIS — N186 End stage renal disease: Secondary | ICD-10-CM | POA: Diagnosis not present

## 2014-06-07 DIAGNOSIS — N2581 Secondary hyperparathyroidism of renal origin: Secondary | ICD-10-CM | POA: Diagnosis not present

## 2014-06-09 DIAGNOSIS — E1129 Type 2 diabetes mellitus with other diabetic kidney complication: Secondary | ICD-10-CM | POA: Diagnosis not present

## 2014-06-09 DIAGNOSIS — D509 Iron deficiency anemia, unspecified: Secondary | ICD-10-CM | POA: Diagnosis not present

## 2014-06-09 DIAGNOSIS — N186 End stage renal disease: Secondary | ICD-10-CM | POA: Diagnosis not present

## 2014-06-09 DIAGNOSIS — N2581 Secondary hyperparathyroidism of renal origin: Secondary | ICD-10-CM | POA: Diagnosis not present

## 2014-06-11 DIAGNOSIS — D509 Iron deficiency anemia, unspecified: Secondary | ICD-10-CM | POA: Diagnosis not present

## 2014-06-11 DIAGNOSIS — E1129 Type 2 diabetes mellitus with other diabetic kidney complication: Secondary | ICD-10-CM | POA: Diagnosis not present

## 2014-06-11 DIAGNOSIS — N186 End stage renal disease: Secondary | ICD-10-CM | POA: Diagnosis not present

## 2014-06-11 DIAGNOSIS — N2581 Secondary hyperparathyroidism of renal origin: Secondary | ICD-10-CM | POA: Diagnosis not present

## 2014-06-14 DIAGNOSIS — E1129 Type 2 diabetes mellitus with other diabetic kidney complication: Secondary | ICD-10-CM | POA: Diagnosis not present

## 2014-06-14 DIAGNOSIS — D509 Iron deficiency anemia, unspecified: Secondary | ICD-10-CM | POA: Diagnosis not present

## 2014-06-14 DIAGNOSIS — N186 End stage renal disease: Secondary | ICD-10-CM | POA: Diagnosis not present

## 2014-06-14 DIAGNOSIS — N2581 Secondary hyperparathyroidism of renal origin: Secondary | ICD-10-CM | POA: Diagnosis not present

## 2014-06-16 DIAGNOSIS — D509 Iron deficiency anemia, unspecified: Secondary | ICD-10-CM | POA: Diagnosis not present

## 2014-06-16 DIAGNOSIS — N186 End stage renal disease: Secondary | ICD-10-CM | POA: Diagnosis not present

## 2014-06-16 DIAGNOSIS — E1129 Type 2 diabetes mellitus with other diabetic kidney complication: Secondary | ICD-10-CM | POA: Diagnosis not present

## 2014-06-16 DIAGNOSIS — N2581 Secondary hyperparathyroidism of renal origin: Secondary | ICD-10-CM | POA: Diagnosis not present

## 2014-06-18 DIAGNOSIS — N2581 Secondary hyperparathyroidism of renal origin: Secondary | ICD-10-CM | POA: Diagnosis not present

## 2014-06-18 DIAGNOSIS — N186 End stage renal disease: Secondary | ICD-10-CM | POA: Diagnosis not present

## 2014-06-18 DIAGNOSIS — E1129 Type 2 diabetes mellitus with other diabetic kidney complication: Secondary | ICD-10-CM | POA: Diagnosis not present

## 2014-06-18 DIAGNOSIS — D509 Iron deficiency anemia, unspecified: Secondary | ICD-10-CM | POA: Diagnosis not present

## 2014-06-21 DIAGNOSIS — N2581 Secondary hyperparathyroidism of renal origin: Secondary | ICD-10-CM | POA: Diagnosis not present

## 2014-06-21 DIAGNOSIS — D509 Iron deficiency anemia, unspecified: Secondary | ICD-10-CM | POA: Diagnosis not present

## 2014-06-21 DIAGNOSIS — N186 End stage renal disease: Secondary | ICD-10-CM | POA: Diagnosis not present

## 2014-06-21 DIAGNOSIS — E1129 Type 2 diabetes mellitus with other diabetic kidney complication: Secondary | ICD-10-CM | POA: Diagnosis not present

## 2014-06-22 DIAGNOSIS — H25813 Combined forms of age-related cataract, bilateral: Secondary | ICD-10-CM | POA: Diagnosis not present

## 2014-06-23 DIAGNOSIS — D509 Iron deficiency anemia, unspecified: Secondary | ICD-10-CM | POA: Diagnosis not present

## 2014-06-23 DIAGNOSIS — N186 End stage renal disease: Secondary | ICD-10-CM | POA: Diagnosis not present

## 2014-06-23 DIAGNOSIS — N2581 Secondary hyperparathyroidism of renal origin: Secondary | ICD-10-CM | POA: Diagnosis not present

## 2014-06-23 DIAGNOSIS — E1129 Type 2 diabetes mellitus with other diabetic kidney complication: Secondary | ICD-10-CM | POA: Diagnosis not present

## 2014-06-25 DIAGNOSIS — D509 Iron deficiency anemia, unspecified: Secondary | ICD-10-CM | POA: Diagnosis not present

## 2014-06-25 DIAGNOSIS — N186 End stage renal disease: Secondary | ICD-10-CM | POA: Diagnosis not present

## 2014-06-25 DIAGNOSIS — N2581 Secondary hyperparathyroidism of renal origin: Secondary | ICD-10-CM | POA: Diagnosis not present

## 2014-06-25 DIAGNOSIS — E1129 Type 2 diabetes mellitus with other diabetic kidney complication: Secondary | ICD-10-CM | POA: Diagnosis not present

## 2014-06-28 DIAGNOSIS — N186 End stage renal disease: Secondary | ICD-10-CM | POA: Diagnosis not present

## 2014-06-28 DIAGNOSIS — E1129 Type 2 diabetes mellitus with other diabetic kidney complication: Secondary | ICD-10-CM | POA: Diagnosis not present

## 2014-06-28 DIAGNOSIS — N2581 Secondary hyperparathyroidism of renal origin: Secondary | ICD-10-CM | POA: Diagnosis not present

## 2014-06-28 DIAGNOSIS — D509 Iron deficiency anemia, unspecified: Secondary | ICD-10-CM | POA: Diagnosis not present

## 2014-06-30 DIAGNOSIS — D509 Iron deficiency anemia, unspecified: Secondary | ICD-10-CM | POA: Diagnosis not present

## 2014-06-30 DIAGNOSIS — E1129 Type 2 diabetes mellitus with other diabetic kidney complication: Secondary | ICD-10-CM | POA: Diagnosis not present

## 2014-06-30 DIAGNOSIS — N186 End stage renal disease: Secondary | ICD-10-CM | POA: Diagnosis not present

## 2014-06-30 DIAGNOSIS — N2581 Secondary hyperparathyroidism of renal origin: Secondary | ICD-10-CM | POA: Diagnosis not present

## 2014-07-02 DIAGNOSIS — N2581 Secondary hyperparathyroidism of renal origin: Secondary | ICD-10-CM | POA: Diagnosis not present

## 2014-07-02 DIAGNOSIS — E1129 Type 2 diabetes mellitus with other diabetic kidney complication: Secondary | ICD-10-CM | POA: Diagnosis not present

## 2014-07-02 DIAGNOSIS — D509 Iron deficiency anemia, unspecified: Secondary | ICD-10-CM | POA: Diagnosis not present

## 2014-07-02 DIAGNOSIS — N186 End stage renal disease: Secondary | ICD-10-CM | POA: Diagnosis not present

## 2014-07-04 DIAGNOSIS — E1122 Type 2 diabetes mellitus with diabetic chronic kidney disease: Secondary | ICD-10-CM | POA: Diagnosis not present

## 2014-07-04 DIAGNOSIS — I739 Peripheral vascular disease, unspecified: Secondary | ICD-10-CM | POA: Diagnosis not present

## 2014-07-04 DIAGNOSIS — E669 Obesity, unspecified: Secondary | ICD-10-CM | POA: Diagnosis not present

## 2014-07-04 DIAGNOSIS — I10 Essential (primary) hypertension: Secondary | ICD-10-CM | POA: Diagnosis not present

## 2014-07-04 DIAGNOSIS — Z992 Dependence on renal dialysis: Secondary | ICD-10-CM | POA: Diagnosis not present

## 2014-07-04 DIAGNOSIS — M79609 Pain in unspecified limb: Secondary | ICD-10-CM | POA: Diagnosis not present

## 2014-07-04 DIAGNOSIS — Y841 Kidney dialysis as the cause of abnormal reaction of the patient, or of later complication, without mention of misadventure at the time of the procedure: Secondary | ICD-10-CM | POA: Diagnosis not present

## 2014-07-04 DIAGNOSIS — E785 Hyperlipidemia, unspecified: Secondary | ICD-10-CM | POA: Diagnosis not present

## 2014-07-04 DIAGNOSIS — N186 End stage renal disease: Secondary | ICD-10-CM | POA: Diagnosis not present

## 2014-07-04 DIAGNOSIS — M542 Cervicalgia: Secondary | ICD-10-CM | POA: Diagnosis not present

## 2014-07-04 DIAGNOSIS — M1990 Unspecified osteoarthritis, unspecified site: Secondary | ICD-10-CM | POA: Diagnosis not present

## 2014-07-05 DIAGNOSIS — D509 Iron deficiency anemia, unspecified: Secondary | ICD-10-CM | POA: Diagnosis not present

## 2014-07-05 DIAGNOSIS — N186 End stage renal disease: Secondary | ICD-10-CM | POA: Diagnosis not present

## 2014-07-05 DIAGNOSIS — E1129 Type 2 diabetes mellitus with other diabetic kidney complication: Secondary | ICD-10-CM | POA: Diagnosis not present

## 2014-07-05 DIAGNOSIS — N2581 Secondary hyperparathyroidism of renal origin: Secondary | ICD-10-CM | POA: Diagnosis not present

## 2014-07-07 DIAGNOSIS — N186 End stage renal disease: Secondary | ICD-10-CM | POA: Diagnosis not present

## 2014-07-07 DIAGNOSIS — E1129 Type 2 diabetes mellitus with other diabetic kidney complication: Secondary | ICD-10-CM | POA: Diagnosis not present

## 2014-07-07 DIAGNOSIS — N2581 Secondary hyperparathyroidism of renal origin: Secondary | ICD-10-CM | POA: Diagnosis not present

## 2014-07-07 DIAGNOSIS — D509 Iron deficiency anemia, unspecified: Secondary | ICD-10-CM | POA: Diagnosis not present

## 2014-07-09 DIAGNOSIS — N186 End stage renal disease: Secondary | ICD-10-CM | POA: Diagnosis not present

## 2014-07-09 DIAGNOSIS — E1129 Type 2 diabetes mellitus with other diabetic kidney complication: Secondary | ICD-10-CM | POA: Diagnosis not present

## 2014-07-09 DIAGNOSIS — N2581 Secondary hyperparathyroidism of renal origin: Secondary | ICD-10-CM | POA: Diagnosis not present

## 2014-07-09 DIAGNOSIS — D509 Iron deficiency anemia, unspecified: Secondary | ICD-10-CM | POA: Diagnosis not present

## 2014-07-12 DIAGNOSIS — D509 Iron deficiency anemia, unspecified: Secondary | ICD-10-CM | POA: Diagnosis not present

## 2014-07-12 DIAGNOSIS — N2581 Secondary hyperparathyroidism of renal origin: Secondary | ICD-10-CM | POA: Diagnosis not present

## 2014-07-12 DIAGNOSIS — N186 End stage renal disease: Secondary | ICD-10-CM | POA: Diagnosis not present

## 2014-07-12 DIAGNOSIS — E1129 Type 2 diabetes mellitus with other diabetic kidney complication: Secondary | ICD-10-CM | POA: Diagnosis not present

## 2014-07-14 DIAGNOSIS — D509 Iron deficiency anemia, unspecified: Secondary | ICD-10-CM | POA: Diagnosis not present

## 2014-07-14 DIAGNOSIS — E1129 Type 2 diabetes mellitus with other diabetic kidney complication: Secondary | ICD-10-CM | POA: Diagnosis not present

## 2014-07-14 DIAGNOSIS — N186 End stage renal disease: Secondary | ICD-10-CM | POA: Diagnosis not present

## 2014-07-14 DIAGNOSIS — N2581 Secondary hyperparathyroidism of renal origin: Secondary | ICD-10-CM | POA: Diagnosis not present

## 2014-07-16 DIAGNOSIS — N186 End stage renal disease: Secondary | ICD-10-CM | POA: Diagnosis not present

## 2014-07-16 DIAGNOSIS — E1129 Type 2 diabetes mellitus with other diabetic kidney complication: Secondary | ICD-10-CM | POA: Diagnosis not present

## 2014-07-16 DIAGNOSIS — N2581 Secondary hyperparathyroidism of renal origin: Secondary | ICD-10-CM | POA: Diagnosis not present

## 2014-07-16 DIAGNOSIS — D509 Iron deficiency anemia, unspecified: Secondary | ICD-10-CM | POA: Diagnosis not present

## 2014-07-19 DIAGNOSIS — N186 End stage renal disease: Secondary | ICD-10-CM | POA: Diagnosis not present

## 2014-07-19 DIAGNOSIS — N2581 Secondary hyperparathyroidism of renal origin: Secondary | ICD-10-CM | POA: Diagnosis not present

## 2014-07-19 DIAGNOSIS — D509 Iron deficiency anemia, unspecified: Secondary | ICD-10-CM | POA: Diagnosis not present

## 2014-07-19 DIAGNOSIS — E1129 Type 2 diabetes mellitus with other diabetic kidney complication: Secondary | ICD-10-CM | POA: Diagnosis not present

## 2014-07-21 DIAGNOSIS — N2581 Secondary hyperparathyroidism of renal origin: Secondary | ICD-10-CM | POA: Diagnosis not present

## 2014-07-21 DIAGNOSIS — D509 Iron deficiency anemia, unspecified: Secondary | ICD-10-CM | POA: Diagnosis not present

## 2014-07-21 DIAGNOSIS — N186 End stage renal disease: Secondary | ICD-10-CM | POA: Diagnosis not present

## 2014-07-21 DIAGNOSIS — E1129 Type 2 diabetes mellitus with other diabetic kidney complication: Secondary | ICD-10-CM | POA: Diagnosis not present

## 2014-07-22 DIAGNOSIS — H40033 Anatomical narrow angle, bilateral: Secondary | ICD-10-CM | POA: Diagnosis not present

## 2014-07-23 DIAGNOSIS — N186 End stage renal disease: Secondary | ICD-10-CM | POA: Diagnosis not present

## 2014-07-23 DIAGNOSIS — D509 Iron deficiency anemia, unspecified: Secondary | ICD-10-CM | POA: Diagnosis not present

## 2014-07-23 DIAGNOSIS — N2581 Secondary hyperparathyroidism of renal origin: Secondary | ICD-10-CM | POA: Diagnosis not present

## 2014-07-23 DIAGNOSIS — E1129 Type 2 diabetes mellitus with other diabetic kidney complication: Secondary | ICD-10-CM | POA: Diagnosis not present

## 2014-07-25 DIAGNOSIS — L299 Pruritus, unspecified: Secondary | ICD-10-CM | POA: Insufficient documentation

## 2014-07-26 DIAGNOSIS — N2581 Secondary hyperparathyroidism of renal origin: Secondary | ICD-10-CM | POA: Diagnosis not present

## 2014-07-26 DIAGNOSIS — N186 End stage renal disease: Secondary | ICD-10-CM | POA: Diagnosis not present

## 2014-07-26 DIAGNOSIS — E1129 Type 2 diabetes mellitus with other diabetic kidney complication: Secondary | ICD-10-CM | POA: Diagnosis not present

## 2014-07-26 DIAGNOSIS — D509 Iron deficiency anemia, unspecified: Secondary | ICD-10-CM | POA: Diagnosis not present

## 2014-07-28 DIAGNOSIS — E1129 Type 2 diabetes mellitus with other diabetic kidney complication: Secondary | ICD-10-CM | POA: Diagnosis not present

## 2014-07-28 DIAGNOSIS — N186 End stage renal disease: Secondary | ICD-10-CM | POA: Diagnosis not present

## 2014-07-28 DIAGNOSIS — N2581 Secondary hyperparathyroidism of renal origin: Secondary | ICD-10-CM | POA: Diagnosis not present

## 2014-07-28 DIAGNOSIS — D509 Iron deficiency anemia, unspecified: Secondary | ICD-10-CM | POA: Diagnosis not present

## 2014-07-30 DIAGNOSIS — N2581 Secondary hyperparathyroidism of renal origin: Secondary | ICD-10-CM | POA: Diagnosis not present

## 2014-07-30 DIAGNOSIS — D509 Iron deficiency anemia, unspecified: Secondary | ICD-10-CM | POA: Diagnosis not present

## 2014-07-30 DIAGNOSIS — N186 End stage renal disease: Secondary | ICD-10-CM | POA: Diagnosis not present

## 2014-07-30 DIAGNOSIS — E1129 Type 2 diabetes mellitus with other diabetic kidney complication: Secondary | ICD-10-CM | POA: Diagnosis not present

## 2014-08-02 DIAGNOSIS — N186 End stage renal disease: Secondary | ICD-10-CM | POA: Diagnosis not present

## 2014-08-02 DIAGNOSIS — D509 Iron deficiency anemia, unspecified: Secondary | ICD-10-CM | POA: Diagnosis not present

## 2014-08-02 DIAGNOSIS — E1129 Type 2 diabetes mellitus with other diabetic kidney complication: Secondary | ICD-10-CM | POA: Diagnosis not present

## 2014-08-02 DIAGNOSIS — N2581 Secondary hyperparathyroidism of renal origin: Secondary | ICD-10-CM | POA: Diagnosis not present

## 2014-08-04 DIAGNOSIS — Z992 Dependence on renal dialysis: Secondary | ICD-10-CM | POA: Diagnosis not present

## 2014-08-04 DIAGNOSIS — E1129 Type 2 diabetes mellitus with other diabetic kidney complication: Secondary | ICD-10-CM | POA: Diagnosis not present

## 2014-08-04 DIAGNOSIS — N186 End stage renal disease: Secondary | ICD-10-CM | POA: Diagnosis not present

## 2014-08-04 DIAGNOSIS — D509 Iron deficiency anemia, unspecified: Secondary | ICD-10-CM | POA: Diagnosis not present

## 2014-08-04 DIAGNOSIS — E1122 Type 2 diabetes mellitus with diabetic chronic kidney disease: Secondary | ICD-10-CM | POA: Diagnosis not present

## 2014-08-04 DIAGNOSIS — N2581 Secondary hyperparathyroidism of renal origin: Secondary | ICD-10-CM | POA: Diagnosis not present

## 2014-08-06 DIAGNOSIS — N2581 Secondary hyperparathyroidism of renal origin: Secondary | ICD-10-CM | POA: Diagnosis not present

## 2014-08-06 DIAGNOSIS — N186 End stage renal disease: Secondary | ICD-10-CM | POA: Diagnosis not present

## 2014-08-09 DIAGNOSIS — N2581 Secondary hyperparathyroidism of renal origin: Secondary | ICD-10-CM | POA: Diagnosis not present

## 2014-08-09 DIAGNOSIS — N186 End stage renal disease: Secondary | ICD-10-CM | POA: Diagnosis not present

## 2014-08-11 DIAGNOSIS — N2581 Secondary hyperparathyroidism of renal origin: Secondary | ICD-10-CM | POA: Diagnosis not present

## 2014-08-11 DIAGNOSIS — N186 End stage renal disease: Secondary | ICD-10-CM | POA: Diagnosis not present

## 2014-08-13 DIAGNOSIS — N186 End stage renal disease: Secondary | ICD-10-CM | POA: Diagnosis not present

## 2014-08-13 DIAGNOSIS — N2581 Secondary hyperparathyroidism of renal origin: Secondary | ICD-10-CM | POA: Diagnosis not present

## 2014-08-15 ENCOUNTER — Ambulatory Visit
Admit: 2014-08-15 | Disposition: A | Discharge status: Home / Self Care | Payer: Self-pay | Attending: Vascular Surgery | Admitting: Vascular Surgery

## 2014-08-15 DIAGNOSIS — G458 Other transient cerebral ischemic attacks and related syndromes: Secondary | ICD-10-CM | POA: Diagnosis not present

## 2014-08-15 DIAGNOSIS — I120 Hypertensive chronic kidney disease with stage 5 chronic kidney disease or end stage renal disease: Secondary | ICD-10-CM | POA: Diagnosis not present

## 2014-08-15 DIAGNOSIS — T82858A Stenosis of vascular prosthetic devices, implants and grafts, initial encounter: Secondary | ICD-10-CM | POA: Diagnosis not present

## 2014-08-15 DIAGNOSIS — E119 Type 2 diabetes mellitus without complications: Secondary | ICD-10-CM | POA: Diagnosis not present

## 2014-08-15 DIAGNOSIS — N186 End stage renal disease: Secondary | ICD-10-CM | POA: Diagnosis not present

## 2014-08-16 DIAGNOSIS — N186 End stage renal disease: Secondary | ICD-10-CM | POA: Diagnosis not present

## 2014-08-16 DIAGNOSIS — N2581 Secondary hyperparathyroidism of renal origin: Secondary | ICD-10-CM | POA: Diagnosis not present

## 2014-08-18 DIAGNOSIS — N2581 Secondary hyperparathyroidism of renal origin: Secondary | ICD-10-CM | POA: Diagnosis not present

## 2014-08-18 DIAGNOSIS — N186 End stage renal disease: Secondary | ICD-10-CM | POA: Diagnosis not present

## 2014-08-20 DIAGNOSIS — N186 End stage renal disease: Secondary | ICD-10-CM | POA: Diagnosis not present

## 2014-08-20 DIAGNOSIS — N2581 Secondary hyperparathyroidism of renal origin: Secondary | ICD-10-CM | POA: Diagnosis not present

## 2014-08-23 DIAGNOSIS — N2581 Secondary hyperparathyroidism of renal origin: Secondary | ICD-10-CM | POA: Diagnosis not present

## 2014-08-23 DIAGNOSIS — N186 End stage renal disease: Secondary | ICD-10-CM | POA: Diagnosis not present

## 2014-08-23 NOTE — Op Note (Signed)
PATIENT NAME:  Kelli Nguyen, Kelli Nguyen MR#:  [id number] DATE OF BIRTH:  1946-01-13  DATE OF PROCEDURE:  12/30/2011  PREOPERATIVE DIAGNOSES:    1. Endstage renal disease.  2. Poorly functioning right arm AV graft with previous intervention and in graft stenosis seen on noninvasive study.  3. Hypertension.  4. Diabetes.   POSTOPERATIVE DIAGNOSES:  1. Endstage renal disease.  2. Poorly functioning right arm AV graft with previous intervention and in graft stenosis seen on noninvasive study.  3. Hypertension.  4. Diabetes.   PROCEDURES:    1. Ultrasound guidance for vascular access to right arm AV graft.  2. Right upper extremity shuntogram and central venogram.  3. Percutaneous transluminal angioplasty for in graft stenosis with 7 mm diameter angioplasty balloon.   SURGEON: Algernon Huxley, M.D.   ANESTHESIA: Local with moderate conscious sedation.   ESTIMATED BLOOD LOSS: Minimal.  CONTRAST USED: 25 mL Visipaque.  FLUOROSCOPY TIME: Approximately two minutes.   INDICATION FOR PROCEDURE: A 69 year old [place] American female with long-standing end-stage renal disease. She has had multiple previous failed accesses. She has had previous intervention to this right upper arm AV graft. She has noninvasive studies showing a significant stenosis of the venous access site as well as a pseudoaneurysm in the proximal portion of the graft. She is brought in for angiography with possible intervention. Risks and benefits were discussed. Informed consent was obtained.   DESCRIPTION OF PROCEDURE: The patient is brought to the vascular obvious interventional radiology suite. The right upper extremity is sterilely prepped and draped, and a sterile surgical field was created. The graft is accessed just beyond the area of the pseudoaneurysm in the proximal portion of the graft with a micropuncture needle and a micropuncture wire and sheath were placed. Imaging was then performed through the micropuncture sheath. This  showed a very irregular stenosis within the AV graft at the venous access site for multiple previous sticks and this was clearly flow-limiting and appeared to be in the 70 to 80% range. We upsized to a 6 [place] sheath and gave 2,500 units of intravenous heparin. The Magic torque wire was used to cross the lesion without difficulty and the area was treated with a 7 mm diameter angioplasty balloon with a tight waste taken which resolved at 20 atmospheres. With the balloon inflated, we performed the arterial anastomosis evaluation. This was patent. There was a pseudoaneurysm that was moderate size in the proximal portion of the graft that had been seen on duplex. I then deflated the balloon. Completion angiogram showed the graft to now be widely patent. The previously placed stent near the venous anastomosis was patent with approximately 30% recurrent stenosis at the distal end of stent. This was not flow limiting. The remainder of the central venous circulation was patent and I elected to terminate the procedure. The sheaths were removed around the 4-0 Monocryl pursestring suture. Pressure was held. Sterile dressing was placed. The patient tolerated the procedure well and was taken to the recovery room in stable condition.   ____________________________ Algernon Huxley, MD jsd:ap D: 12/30/2011 15:26:23 ET T: 12/30/2011 16:16:20 ET JOB#: [id number]  cc: Algernon Huxley, MD, <Dictator> John B. Sarina Ser, MD [place] Lilian Kapur, MD Algernon Huxley MD ELECTRONICALLY SIGNED 01/08/2012 10:00

## 2014-08-25 DIAGNOSIS — N186 End stage renal disease: Secondary | ICD-10-CM | POA: Diagnosis not present

## 2014-08-25 DIAGNOSIS — N2581 Secondary hyperparathyroidism of renal origin: Secondary | ICD-10-CM | POA: Diagnosis not present

## 2014-08-26 NOTE — Op Note (Signed)
PATIENT NAME:  Kelli Nguyen, Kelli Nguyen MR#:  [id number] DATE OF BIRTH:  [birth year]  DATE OF PROCEDURE:  01/06/2013  PREOPERATIVE DIAGNOSES:  1.  End-stage renal disease.  2.  Poorly functioning right arm arteriovenous graft.  3.  Diabetes.  4.  Coronary disease.   POSTOPERATIVE DIAGNOSES: 1.  End-stage renal disease.  2.  Poorly functioning right arm arteriovenous graft.  3.  Diabetes.  4.  Coronary disease.   PROCEDURE:  1.  Ultrasound guidance with vascular access to the arteriovenous graft in both an antegrade and retrograde fashion.  2.  Right upper extremity shuntogram and central venogram.  3.  Percutaneous transluminal angioplasty of stenosis in the axillary vein just beyond the anastomosis and previously placed stent with a 7 mm diameter angioplasty balloon.  4.  Percutaneous transluminal angioplasty of proximal and midportion of the graft with 7 mm diameter angioplasty balloon.   SURGEON: Leotis Pain, M.D.   ANESTHESIA: Local with moderate conscious sedation.   ESTIMATED BLOOD LOSS: 25 mL.   INDICATION FOR PROCEDURE: A 69 year old African American female with end-stage renal disease. Her graft is performing more poorly for her dialysis access needs with diminished flow and we are asked to evaluate this. Non-invasive study showed areas of elevated velocities within the graft as well as in the axillary vein beyond the graft. She is brought in for shuntogram for possible intervention to these areas. Risks and benefits were discussed. Informed consent was obtained.   DESCRIPTION OF PROCEDURE: The patient is brought to the vascular interventional radiology suite. The right upper extremity was sterilely prepped and draped and a sterile surgical field was created. The graft was initially accessed in an antegrade fashion under direct ultrasound guidance. A micropuncture needle and micropuncture wire and sheath were then placed and upsized to a 6-French sheath. Imaging showed that there was a  stenosis proximal to and within the area of this graft sheath. The distal portion of the graft was patent, but there was about a 65% to 70% stenosis in the axillary vein just beyond the previously placed in the venous anastomosis. The remainder of the central venous circulation was patent. I was oriented to treat this lesion. With this accessed, I crossed this lesion without difficulty and balloon angioplastied this with a 7 mm diameter high-pressure angioplasty balloon in the axillary vein. A waist was taken, which resolved with angioplasty and an approximately 20% residual stenosis remained after angioplasty. We were not in a location to treat the in graft stenosis. I had to access the distal portion of the graft under direct ultrasound guidance in a retrograde fashion. Micropuncture wire and sheath were then placed and we upsized to a 6-French sheath across the lesion without difficulty. A 7 mm diameter high-pressure angioplasty balloon was then inflated within the proximal and mid portions of the graft with tight waist taken, particularly in the midportion of the graft. The previous antegrade sheath was removed prior to the angioplasties. Following angioplasty, imaging was performed, which showed marked improvement within the graft without significant residual stenosis. At this point, I elected to terminate the procedure. This  sheath was also removed around a 4-0 Monocryl purse string suture. Pressure was held. Sterile dressing was placed. The patient tolerated the procedure well and was taken to the recovery room in stable condition.   ____________________________ Algernon Huxley, MD jsd:aw D: 01/06/2013 14:20:38 ET T: 01/06/2013 15:02:35 ET JOB#: [id number]  cc: Algernon Huxley, MD, <Dictator>  Algernon Huxley MD ELECTRONICALLY SIGNED 01/06/2013 16:16

## 2014-08-26 NOTE — Op Note (Signed)
PATIENT NAME:  Kelli Nguyen, Kelli Nguyen MR#:  [id number] DATE OF BIRTH:  02-05-1946  DATE OF PROCEDURE:  04/09/2013  PREOPERATIVE DIAGNOSES:  1.  End-stage renal disease requiring hemodialysis.  2.  Complication of arteriovenous dialysis device.  3.  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  4.  Hypertension.  5.  Morbid obesity.   POSTOPERATIVE DIAGNOSES: 1.  End-stage renal disease requiring hemodialysis.  2.  Complication of arteriovenous dialysis device.  3.  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  4.  Hypertension.  5.  Morbid obesity.   PROCEDURES PERFORMED:  1.  Contrast injection, left internal jugular vein.  2.  Ultrasound-guided access, left jugular vein.  3.  Introduction catheter into superior vena cava.  4.  Percutaneous transluminal angioplasty to 10 mm maximum of the left internal jugular vein and left innominate vein.  5.  Introduction of a vascular portion of HeRO graft over a wire.  6.  A partial resection of previous left arm brachial axillary dialysis graft.  7.  Creation of a brachial artery HeRO graft via the internal jugular vein.   SURGEON:  Katha Cabal, M.D.   FIRST ASSISTANT:  Ms. Gillie Manners.   ANESTHESIA:  General by LMA.   FLUIDS:  Per anesthesia record.   ESTIMATED BLOOD LOSS:  100 mL.   SPECIMEN:  Segment of the graft is resected but not sent for specimen.   FLUOROSCOPY TIME:  Approximately 5 minutes.   CONTRAST USED:  Approximately 30 mL of Isovue.   INDICATIONS:  Kelli Nguyen is a 69 year old woman who presented for attempted salvage of her right arm access. After multiple successful attempts, ultimately salvage was not feasible as the patient had both problems with the graft itself, multiple previously placed stents as well as profound narrowing with occlusion of the  subclavian and innominate veins. Studies demonstrated patency of the innominate vein on the left; however, there was significant narrowing and therefore brachial axillary graft is not feasible, however, HeRO graft is feasible and this was recommended. The risks, benefits as well as alternatives were reviewed. All questions answered. The patient agrees to proceed.   DESCRIPTION OF PROCEDURE:  The patient was taken to the Operating Room and placed in the supine position. After adequate general anesthesia is induced and appropriate invasive monitors are placed, the patient is positioned supine with the left arm extended palm upward and the neck slightly extended and rotated to the right. The neck and chest wall as well as the left arm were prepped and draped in a sterile fashion. Appropriate time-out is called.   Ultrasound was placed in a sterile sleeve. Ultrasound was used to identify the jugular vein. The jugular vein is echolucent and compressible indicating patency. It does appear to narrow at the level of the clavicle and cannot be visualized below this level. Slightly more proximally in the neck, it does appear to be approximately 12 mm in diameter, compressible and echolucent as noted above suggesting it is patent at this level.   Micro needle is inserted, microwire followed by micro sheath. The wire will not cross into the central venous system, and therefore a hand injection of contrast is utilized, which demonstrates a short segment occlusion of the jugular vein. There is then patency of the confluence of the jugular with the subclavian filling narrowed that is stenotic,  innominate. The superior vena cava fills as well.   Working with a Secretary/administrator, the micro sheath and Glidewire are able to cross the occlusion, and then are negotiated into the superior vena cava. An 8-French sheath is then inserted over the wire and subsequently a more formal central venous study is performed through the sheath, which  is 11 cm, and seated in the innominate vein on the left. This represents a catheter placement to the central venous anatomy. Subsequently, a KMP catheter and Amplatzer Super Stiff wire are negotiated through the innominate into the superior vena cava through the atrium and then down into the inferior vena cava.   First an 8 x 6 balloon and then a 10 x 8 balloon are advanced across the wire. The sheath is repositioned, and angioplasty is performed. Both angioplasties are 8 to 12 atmospheres for approximately one minute. Followup demonstrates patency of the innominate vein and subsequently the peel-away sheath and dilator are advanced over the wire and the intravascular portion of the HeRO graft is advanced under fluoroscopic guidance and positioned with its tip in the inferior vena cava. It is then flushed copiously with sterile saline and then the wire is removed.   Attention is then turned to the brachial artery exposure, which through a linear incision just above the antecubital crease. The previous graft is exposed and a distance of approximately 4 cm is dissected circumferentially. An 0 Ethibond is used to ligate proximally and distally and this segment is resected. This will allow for easy passage of the HeRO graft across this area and up the arm as opposed to trying to place the graft between the skin and old graft, which would cause a narrowing or an external compression. The brachial artery is then exposed more proximally to the previous incision and looped proximally and distally.   An incision is then made in the deltopectoral groove and carried down to expose the fascia. A tunneler is then used to pass from the brachial artery exposure to the deltoid incision and the PTFE portion of the HeRO graft is pulled through the subcutaneous tunnel.   Fluoroscopy is then used to position the silicon intravascular portion with its tip in the mid atrium. The silicon portion is then transected and hooked to  the metal hub of the PTFE portion. It is secured with 0 Ethibond around the grommet. The PTFE portion is then positioned so that it lays flat with minimal tension through the subcutaneous tunnel creating a nice straight smooth HeRO graft. It is then copiously irrigated with sterile saline and the intravascular portion is clamped at the level of the grommet.   With the brachial artery in its native bed, the PTFE portion is approximated; it is marked. It is then transected with a slight bevel. The artery is clamped, delivered into the operative field. Arteriotomy is made and extended with Potts scissors. Stay sutures of 6-0 Prolene are placed, and an end graft-to-side brachial artery anastomosis is fashioned with running CV-6 suture. Flushing maneuvers are performed and flow is established first through the graft and then down to the hand. Radial pulse is maintained. A palpable thrill is noted in the graft. The graft now has a smooth contour with a straight lie, which should be easy to access.   Both wounds as well as the neck counterincision are irrigated and then closed using 3-0 Vicryl for the deep layers and then 4-0 Monocryl subcuticular, Dermabond is applied to all three incisions. The patient tolerated the procedure  well. There were no immediate complications. Sponge and needle counts were correct, and she is taken to the recovery area in excellent condition.   ____________________________ Katha Cabal, MD ggs:jm D: 04/09/2013 10:24:58 ET T: 04/09/2013 10:43:24 ET JOB#: [id number]  cc: Katha Cabal, MD, <Dictator> John B. Sarina Ser, MD [place] MD ELECTRONICALLY SIGNED 04/12/2013 17:25

## 2014-08-26 NOTE — Op Note (Signed)
PATIENT NAME:  Kelli Nguyen, GAFFKE MR#:  [id number] DATE OF BIRTH:  May 21, 1945  DATE OF PROCEDURE:  03/09/2013  PREOPERATIVE DIAGNOSES: 1.  End-stage renal disease requiring hemodialysis.  2.  Thrombosed right arm brachial axillary dialysis graft.  3.  Superior vena cava syndrome.  4.  Complication of left femoral dialysis catheter.   POSTOPERATIVE DIAGNOSES:  1.  End-stage renal disease requiring hemodialysis.  2.  Thrombosed right arm brachial axillary dialysis graft.  3.  Superior vena cava syndrome.  4.  Complication of left femoral dialysis catheter.   PROCEDURE PERFORMED: 1.  Right upper extremity shuntogram.  2.  Introduction catheter into the superior vena cava, left arm approach.  3.  Exchange of cuffed tunneled dialysis catheter, same venous access.   SURGEON: Hortencia Pilar, M.D.   SEDATION: Versed 5 mg plus fentanyl 200 mcg, administered IV. Continuous ECG, pulse oximetry and cardiopulmonary monitoring was performed throughout the entire procedure by the interventional radiology nurse. Total sedation time was approximately 1 hour.   ACCESS:   1.  A 6-French sheath, right arm brachial axillary dialysis graft.  2.  A 5-French sheath, left arm basilic vein.  3.  Existing access, left common femoral vein.   CONTRAST: Isovue 40 mL.   FLUOROSCOPY TIME: 9.0 minutes.   INDICATIONS: [name] is a 69 year old woman who presents with thrombosis of her dialysis access and nonfunction of her dialysis catheter. She has been declotted twice in the last 3 to 4 weeks, and therefore it appears unlikely salvage of her right arm graft is feasible. The risks and benefits for exchange of a catheter to secure working access and then evaluation for possible future access is reviewed with the patient. The patient agrees to proceed.   DESCRIPTION OF PROCEDURE: The patient is taken to special procedures and placed in the supine position. After adequate sedation is achieved, she is positioned  supine, and her right arm is positioned extended palm upward, prepped and draped in a sterile fashion. The existing catheter in the left femoral area is also prepped and draped in a sterile fashion.   Lidocaine 1% is infiltrated in the soft tissues overlying the palpable AV graft in the right arm, and access is obtained with a micropuncture needle, microwire followed by microsheath, J-wire followed by a 6-French sheath. A small injection of contrast demonstrates thrombus within the graft, and a KMP catheter and floppy Glidewire are negotiated to the level of the venous anastomosis where thrombus is noted within the axillary vein is well. Subclavian vein appears patent; however, there appears to be a problem at the level of the innominate vein on the right. Therefore, the Glidewire and Kumpe catheter are advanced into the central venous system proper. Magnified views are obtained demonstrating greater than 80% stenosis within the innominate vein on the right. Superior vena cava appears patent distally, and there is normal filling of the atrium and ventricle with a pulmonary artery blush. Given this circumstance, the number times she has been declotted recently, the fact that there are already multiple stents placed, I felt that thrombectomy would be inappropriate and that consideration for a HeRO graft to be made with placement of a new PTFE portion in the right arm, acknowledging this would be the 4th graft placed in that arm. Pursestring suture is placed around the sheath. The sheath is removed.   Attention is then turned to the catheter. An Amplatz Super Stiff wire is advanced through the existing catheter, and the catheter is freed up using  blunt dissection to free the cuff.  The catheter is then removed. Dilator peel-away sheath is inserted over the Amplatz wire, and a 42 cm tip to cuff Arrow catheter is advanced over the wire and positioned with its tip at the level of the atrium. It is then secured to the  skin with 0 silk. Both lumens aspirate very well and flush easily, then 5000 units of heparin are used to pack each lumen.  A sterile dressing is applied.   The patient is then repositioned and her left arm is extended palm upward. The left arm is prepped and draped in a sterile fashion. Ultrasound is utilized. Initial attempts at accessing the old graft are successful but demonstrate that the graft is literally crumpled, and therefore attempting to access the central venous system through this graft is not feasible. Therefore, the ultrasound is utilized to identify the basilic vein. Basilic vein is echolucent and compressible, indicating patency. Image is recorded for the permanent record, and under direct ultrasound visualization, a microneedle is inserted, microwire followed by microsheath. Hand injection of contrast is utilized but does not fill the central venous system, and therefore, a 5-French sheath is inserted over a wire and a combination of a KMP catheter with a floppy Glidewire is negotiated across an old stent and into the subclavian and then the superior vena cava. Images are recorded serially, and again is noted a 75% to 80% stenosis within the innominate vein on the left. Nevertheless, catheter wire combination is able to negotiate this to the superior vena cava, and therefore a HeRO graft would be feasible.   The sheath is removed, pressure is held and there are no immediate complications.   INTERPRETATION: Initially, there is confirmation of thrombus within the right arm brachial axillary graft. There is also formation of thrombus extending into the axillary; subclavian is patent, but the innominate demonstrates a very high-grade stricture stenosis. Given the previous multiple interventions and multiple stents, thrombectomy, at this point, seemed to be less suitable and consideration for a HeRO graft is made and will be discussed with the patient. In a similar fashion, the left arm  demonstrates narrowing at the axillary level. The more proximal axillary and the subclavian vein are widely patent, but there is a high-grade stenosis in the innominate on the left. Nevertheless, a HeRO graft would be feasible on this side as well.   The cuffed tunneled dialysis catheter is exchanged as noted above.     ____________________________ Katha Cabal, MD ggs:dmm D: 03/10/2013 10:04:53 ET T: 03/10/2013 10:15:51 ET JOB#: [id number]  cc: Katha Cabal, MD, <Dictator> [facility] Katha Cabal MD ELECTRONICALLY SIGNED 03/12/2013 8:15

## 2014-08-26 NOTE — Op Note (Signed)
PATIENT NAME:  Kelli Nguyen, LIGHTCAP MR#:  [id number] DATE OF BIRTH:  08/18/1945  DATE OF PROCEDURE:  05/22/2012  PREOPERATIVE DIAGNOSES:  1. End-stage renal disease.  2. Thrombosed right arm AV graft.  3. Morbid obesity.  4. Hypertension.   POSTOPERATIVE DIAGNOSES:  1. End-stage renal disease.  2. Thrombosed right arm AV graft.  3. Morbid obesity.  4. Hypertension.   PROCEDURE PERFORMED: 1. Ultrasound guidance for vascular access to the graft in both an antegrade and retrograde fashion crossing.  2. Upper extremity shuntogram and central venogram.  3. Catheter-directed thrombolysis with 4 mg of TPA with the AngioJet AVX catheter.  4. Mechanical rheolytic thrombectomy to the graft with the AngioJet AVX catheter.  5. Percutaneous transluminal angioplasty of the arterial anastomosis of the proximal portion of graft and the mid graft with a 6 mm diameter angioplasty balloon.  6. Placement of a Viabahn covered stent to the proximal portion of the graft for residual flow-limiting thrombus with a Viabahn 6 mm diameter x 10 cm length covered stent for residual thrombus causing flow limitation and pseudoaneurysm.   SURGEON: Algernon Huxley, M.D.   ANESTHESIA: Local with monitored conscious sedation.   ESTIMATED BLOOD LOSS: Approximately 25 mL.   INDICATION FOR PROCEDURE: This is a female with end-stage renal disease who we have intervened on in the last couple of weeks. We performed any intervention for venous anastomotic stenosis and a central venous stenosis. She comes with thrombosed graft. the risks and benefits were discussed for attempted graft salvage, and informed consent was obtained.   DESCRIPTION OF PROCEDURE: The patient was brought to the vascular interventional radiology suite and the right upper extremity was sterilely prepped and draped and a sterile surgical field was created. Ultrasound was used to access the graft due to its pulseless nature. this was done in an antegrade and  retrograde fashion crossing with micropuncture needles and permanent image was recorded. Micropuncture wire and sheath were placed. We then upsized to 6 French sheaths and 3000 units of intravenous heparin were given for systemic anticoagulation. I then instilled 4 mg of TPA from the arterial anastomosis into the axillary vein and allowed this to dwell for approximately 20 minutes. Mechanical rheolytic thrombectomy was then performed through the AngioJet AVX catheter over similar locations. Following this, there was still residual thrombus causing flow limitation in the arterial anastomosis proximal midportion of the graft. A 6 mm diameter angioplasty balloon was inflated encompassing all these areas. There was a known pseudoaneurysm which was still present in the proximal portion of the graft. Beyond this was some thrombus which was flow-limiting and for these 2 reasons I elected to place a covered stent in the proximal portion of the graft. This was done with a 6 mm diameter x 10 cm length Viabahn stent deployed over an 0.018 wire and then post dilated with a 7 mm balloon. The angiographic completion result following this showed the graft to be widely patent. The previously intervened upon venous anastomosis in the central venous lesion appeared patent today. At this point I elected to terminate the procedure. Both sheaths were removed around a 4-0 Monocryl pursestring suture. Pressure was held. Sterile dressing was placed. The patient tolerated the procedure well and was taken to the recovery room in stable condition.   ____________________________ Algernon Huxley, MD jsd:jm D: 05/22/2012 17:07:44 ET T: 05/23/2012 13:08:47 ET JOB#: [id number]  cc: Algernon Huxley, MD, <Dictator> Algernon Huxley MD ELECTRONICALLY SIGNED 05/25/2012 8:33

## 2014-08-26 NOTE — Op Note (Signed)
PATIENT NAME:  Kelli Nguyen, COLO MR#:  [id number] DATE OF BIRTH:  1946/04/20  DATE OF PROCEDURE:  05/11/2012  PREOPERATIVE DIAGNOSES: 1.  Endstage renal disease.  2.  Poorly functioning right arm arteriovenous graft with pseudoaneurysm.  3.  Obesity.  4.  Hypertension.  5.  Diabetes.  6.  Coronary artery disease.   POSTOPERATIVE DIAGNOSES: 1.  Endstage renal disease.  2.  Poorly functioning right arm arteriovenous graft with pseudoaneurysm.  3.  Obesity.  4.  Hypertension.  5.  Diabetes.  6.  Coronary artery disease.   PROCEDURES PERFORMED: 1.  Ultrasound guidance for vascular access to right arm AV graft in both an antegrade and retrograde fashion.  2.  Right upper extremity shuntogram and central venogram.  3.  Percutaneous transluminal angioplasty of mid graft with 7 mm diameter angioplasty balloon.  4.  Percutaneous transluminal angioplasty of venous anastomotic area and axillary vein with 7 mm diameter angioplasty balloon.  5.  Percutaneous transluminal angioplasty of innominate vein with 10 mm diameter angioplasty balloon.   SURGEON: Algernon Huxley, M.D.   ANESTHESIA: Local with moderate conscious sedation.   ESTIMATED BLOOD LOSS: Approximately 50 mL.   CONTRAST USED:  60 mL of Visipaque.   INDICATION FOR THE PROCEDURE:  A 69 year old [place] American female with end-stage renal disease. Her right arm AV graft, for which she has had multiple surgical revisions, has a pseudoaneurysm and diminished function. We are evaluating this further due to findings on a noninvasive study and a shuntogram is recommended. Risks and benefits were discussed. Informed consent was obtained.   DESCRIPTION OF PROCEDURE: The patient is brought to the vascular suite. The right upper extremity was sterilely prepped and draped, and a sterile surgical field was created. Initially, to make sure we evaluated the arterial anastomosis and pseudoaneurysm and  the arterial anastomosis, as well as the mid  graft, it was accessed near the venous access site in a retrograde fashion was done with the micropuncture needle under direct ultrasound guidance. A permanent image was recorded. The micropuncture wire and sheath were placed. We up sized to a 6-French sheath. A Kumpe catheter was placed at the arterial anastomosis. The arterial anastomosis itself was widely patent without any significant problems from the brachial artery. The pseudoaneurysm started 2 to 3 cm beyond the anastomosis, and the actual part of the aneurysm with flow was not that large, only a diameter of maybe 10 to 12 mm. For this reason, I did not treat this area with a covered stent today. There was, however, a very high-grade stenosis in the mid graft that was in the 80% to 90% range, and this was treated with a 7 mm diameter angioplasty balloon with good angiographic result. Through the retrograde sheath, imaging was performed. The venous anastomosis had what appeared to be a vein valve just beyond the anastomosis in the brachial/axillary vein, which was causing a weblike stenosis. The initial retrograde sheath was removed, and the antegrade sheath was placed in the midportion of the graft just away from where we had previously angioplastied. I was able to cross the lesion without difficulty with a Magic torque wire and treat it with a 7 mm diameter angioplasty balloon with resolution of this weblike stenosis and improve flow. More centrally, there seemed to be some significant irregularity of the right subclavian vein as it joined the innominate vein. A wire would not pass initially, and this did appear to be a significant stenosis. It was somewhat difficult to localize, and  a magnified view with a Kumpe catheter was performed, and this was indeed at least a moderate stenosis in this area. This was an unusual location for a lesion, but when I had difficulty getting the wire to pass, it was clear this was a real finding. With the help of a Kumpe  catheter and some manipulations, I was able to get the Magic torque wire across it. A 10 mm diameter angioplasty balloon was selected, and a waist was seen at this location. It resolved with angioplasty and completion angiogram following this showed significantly improved flow and there was less retrograde flow up into the jugular vein consistent with more continuous flow from the shunt. At this point, I elected to terminate the procedure. The second sheath was removed around a 4-0 Monocryl pursestring suture. Pressure was held.   Sterile dressing was placed. The patient tolerated the procedure well and was taken to the recovery room in stable condition.    ____________________________ Algernon Huxley, MD jsd:dm D: 05/11/2012 13:11:00 ET T: 05/11/2012 22:02:39 ET JOB#: [id number]  cc: Algernon Huxley, MD, <Dictator> Algernon Huxley MD ELECTRONICALLY SIGNED 05/15/2012 8:30

## 2014-08-27 DIAGNOSIS — N186 End stage renal disease: Secondary | ICD-10-CM | POA: Diagnosis not present

## 2014-08-27 DIAGNOSIS — N2581 Secondary hyperparathyroidism of renal origin: Secondary | ICD-10-CM | POA: Diagnosis not present

## 2014-08-27 NOTE — Op Note (Signed)
PATIENT NAME:  Kelli Nguyen, Kelli Nguyen MR#:  [id number] DATE OF BIRTH:  [birth year]  DATE OF PROCEDURE:  02/25/2014  PREOPERATIVE DIAGNOSES: 1. Complication of dialysis device with poor dialysis runs.  2. Superior vena cava syndrome.  3. End-stage renal disease requiring hemodialysis.   POSTOPERATIVE DIAGNOSIS:  1. Complication of dialysis device with poor dialysis runs.  2. Superior vena cava syndrome.  3. End-stage renal disease requiring hemodialysis.    PROCEDURES PERFORMED: 1. Contrast injection, left arm HeRO graft.  2. Percutaneous transluminal angioplasty and stent placement using an 8 x 80 Fluency mid-portion of the AV graft.  3. Percutaneous transluminal angioplasty of the HeRO graft at the metal grommet, separate and distinct lesion.   SURGEON: Katha Cabal, MD  SEDATION: Versed 4 mg plus fentanyl 100 mcg administered IV. Continuous ECG, pulse oximetry and cardiopulmonary monitoring is performed throughout the entire procedure by the interventional radiology nurse. Total sedation time was 1 hour.   ACCESS: A 7-French sheath, antegrade direction, left arm HeRO graft.   CONTRAST USED: Isovue 20 mL.   FLUOROSCOPY TIME: 3.1 minutes.   INDICATIONS: Kelli Nguyen is a 69 year old woman who has been having increasing problems at dialysis. Physical examination as well as noninvasive studies demonstrated several high-grade strictures within the graft, the worst of which appeared to be in the mid-portion at the cannulation site. The risks and benefits for angiography were reviewed. All questions answered. The patient agreed to proceed.   DESCRIPTION OF PROCEDURE: The patient is taken to the special procedure suite, placed in the supine position. After adequate sedation is achieved, her left arm is extended palm upward and prepped and draped in sterile fashion. Appropriate timeout is called.   Ultrasound is placed in a sterile sleeve as multiple attempts at accessing the graft near the  arterial anastomosis are unsuccessful. With ultrasound, the graft is identified. It is echolucent indicating patency. Image is recorded for the permanent record and under real-time visualization, a micro needle is inserted, micro wire followed by micro sheath, J-wire followed by a 6-French sheath.   Hand injection of contrast is then used to demonstrate the HeRO graft, which demonstrates a greater than 90% stenosis right at the area that they have been cannulating. There is a 70% narrowing right at the level of the grommet. The intravascular portion of the HeRO graft appears to be in good position and free of lesions and/or stenoses.   Heparin 3000 units is given and a Magic Torque but ultimately an Amplatz wire is advanced. Sheath is upsized to a 7-French sheath and, subsequently an 8 x 80 Fluency stent is deployed, advanced over the wire and deployed across the cannulation zone, covering the 90% lesion. An 8 x 60 Dorado balloon is then advanced through the stent and inflated to 16 atmospheres for one minute. Two separate inflations are made.   Followup angiography demonstrates an excellent result with complete resolution of the previously noted narrowing and incomplete expansion of the 8-mm Fluency. The balloon is then reintroduced and positioned across the grommet where there is a 70% narrowing. Again, inflation is to 12 to 14 atmospheres for one full minute. Follow-up imaging demonstrates marked improvement in this area as well with less than 5% residual stenosis.   Follow-up imaging demonstrates rapid flow of contrast now through the system with filling of the atrium as anticipated.   Pursestring suture is placed around the sheath. The sheath is removed. Light pressure is held, and there are no immediate complications.   INTERPRETATION:  Initial images of the HeRO graft demonstrate a 90% narrowing in the mid-portion of the graft at site that they had been cannulating. The grommet area up by the  shoulder is a separate and distinct lesion and has a 70% narrowing. The HeRO graft is otherwise in good position with a smooth contour and no other abnormalities are identified. Following angioplasty and stent placement in the mid-portion, there is complete resolution of this lesion. Following angioplasty at the grommet, there is significant improvement with less than 5% residual stenosis.   SUMMARY: Successful salvage of left arm HeRO graft as described above.      ____________________________ Katha Cabal, MD ggs:lm D: 02/25/2014 13:46:35 ET T: 02/25/2014 23:48:00 ET JOB#: [id number]  cc: Katha Cabal, MD, <Dictator> Katha Cabal MD ELECTRONICALLY SIGNED 03/09/2014 11:45

## 2014-08-27 NOTE — Op Note (Signed)
PATIENT NAME:  Kelli Nguyen, Kelli Nguyen MR#:  [id number] DATE OF BIRTH:  [birth year]  DATE OF PROCEDURE:  06/16/2013  PREOPERATIVE DIAGNOSIS: End-stage renal disease with functional permanent dialysis access and no longer needing PermCath.   POSTOPERATIVE DIAGNOSIS: End-stage renal disease with functional permanent dialysis access and no longer needing PermCath.   PROCEDURE: Removal of left femoral PermCath.   SURGEON: Musician.   ANESTHESIA: Local.   ESTIMATED BLOOD LOSS: Minimal.   INDICATION FOR THE PROCEDURE: The patient is a 69 year old female with end-stage renal disease. Her access is functional and she no longer needs a PermCath, therefore this will be removed.   DESCRIPTION OF THE PROCEDURE: The patient is brought to the vascular interventional radiology area and positioned supine. The left femoral, groin area and existing catheter were sterilely prepped and draped in a sterile surgical field was created. The area was locally anesthetized copiously with 1% lidocaine. Hemostats were used to help dissect out the cuff. The catheter was then removed in its entirety without difficulty. This was done with gentle traction. Pressure was held at the site. Sterile dressing was placed. The patient tolerated the procedure well.   ____________________________ Marin Shutter. Shirleymae Hauth, PA-C cnh:aw D: 06/16/2013 08:32:11 ET T: 06/16/2013 08:41:25 ET JOB#: [id number]  cc: Marin Shutter. Melquan Ernsberger, PA-C, <Dictator> Saahas Hidrogo N Shalynn Jorstad PA ELECTRONICALLY SIGNED 06/16/2013 12:11

## 2014-08-28 NOTE — Op Note (Signed)
PATIENT NAME:  Kelli Nguyen, Kelli Nguyen MR#:  [id number] DATE OF BIRTH:  11-Apr-1946  DATE OF PROCEDURE:  07/26/2011  PREOPERATIVE DIAGNOSES:  1. Endstage renal disease.  2. Clotted right upper arm arteriovenous graft.  3. Hypertension.  4. Diabetes.   POSTOPERATIVE DIAGNOSES:  1. End-stage renal disease. 2. Clotted right upper arm arteriovenous graft. 3. Hypertension. 4. Diabetes.  PROCEDURES:  1. Ultrasound guidance for vascular access to right arm arteriovenous graft in both an antegrade and retrograde fashion.  2. Catheter-directed thrombolysis with 4 mg of TPA directed with the AngioJet Power Pulse spray.  3. Mechanical rheolytic thrombectomy with the AngioJet AVX catheter.  4. Fogarty embolectomy for residual arterial plug.  5. Percutaneous transluminal angioplasty of graft for residual thrombosis causing flow limitation after above procedures.   SURGEON: Leotis Pain, MD   ANESTHESIA: Local with moderate conscious sedation.   ESTIMATED BLOOD LOSS: 25 mL.  CONTRAST USED: 38 mL Visipaque.   INDICATION FOR PROCEDURE: The patient is a 69 year old African American female with end-stage renal disease. She presented in my office with a clotted graft today. We will attempt to declot this today. The risks and benefits are discussed. Informed consent is obtained.   DESCRIPTION OF PROCEDURE: The patient was brought to the Vascular Interventional Radiology Suite. The right upper extremity was sterilely prepped and draped, and a sterile surgical field was created. As the graft was pulseless, I accessed this under direct ultrasound guidance without difficulty with a micropuncture needle, one in an antegrade direction, one in a retrograde direction, a micropuncture wire and sheath, and upsized to a 6-French sheath. Permanent image was recorded. Imaging showed a thrombosed graft which had been seen earlier on our duplex exam.  I passed Magic Torque Wires into the central venous circulation into the  brachial artery. I instilled 4 mg of TPA with Power Pulse Spray fashion from the arterial anastomosis to the stent at the venous anastomosis. This was allowed to dwell for 15 to 20 minutes. I then performed mechanical rheolytic thrombectomy from the arterial anastomosis into the axillary vein. Following this, there was still a residual arterial plug that was treated with a Fogarty embolectomy balloon over the wire which resulted in improvement with now the thrombus being in the midportion of the graft around the retrograde sheath and some around the antegrade sheath. The antegrade sheath was pulled back so it was barely in the graft. The retrograde sheath was removed, and a 4-0 Monocryl suture was placed; and then a 7 mm diameter angioplasty balloon was used to inflate the residual thrombosis that was flow-limiting in the midportion of the graft in antegrade direction. Following this, there was good in-line flow with some mild stenosis at the venous anastomosis that was 30% or less. The central venous circulation was patent. At this point, the second 6-0 sheath was removed around a 4-0 Monocryl pursestring suture. Pressure was held. Sterile dressing was placed. The patient tolerated the procedure well and was taken to the recovery room in stable condition.   ____________________________ Algernon Huxley, MD jsd:cbb D: 07/26/2011 17:51:16 ET T: 07/27/2011 12:05:00 ET JOB#: [id number]  cc: Algernon Huxley, MD, <Dictator> Nephrologist Algernon Huxley MD ELECTRONICALLY SIGNED 07/29/2011 8:38

## 2014-08-30 DIAGNOSIS — N2581 Secondary hyperparathyroidism of renal origin: Secondary | ICD-10-CM | POA: Diagnosis not present

## 2014-08-30 DIAGNOSIS — N186 End stage renal disease: Secondary | ICD-10-CM | POA: Diagnosis not present

## 2014-09-01 DIAGNOSIS — N2581 Secondary hyperparathyroidism of renal origin: Secondary | ICD-10-CM | POA: Diagnosis not present

## 2014-09-01 DIAGNOSIS — N186 End stage renal disease: Secondary | ICD-10-CM | POA: Diagnosis not present

## 2014-09-03 DIAGNOSIS — E1122 Type 2 diabetes mellitus with diabetic chronic kidney disease: Secondary | ICD-10-CM | POA: Diagnosis not present

## 2014-09-03 DIAGNOSIS — N186 End stage renal disease: Secondary | ICD-10-CM | POA: Diagnosis not present

## 2014-09-03 DIAGNOSIS — N2581 Secondary hyperparathyroidism of renal origin: Secondary | ICD-10-CM | POA: Diagnosis not present

## 2014-09-03 DIAGNOSIS — Z992 Dependence on renal dialysis: Secondary | ICD-10-CM | POA: Diagnosis not present

## 2014-09-04 NOTE — Op Note (Signed)
PATIENT NAME:  Kelli Nguyen, VANCIL MR#:  [id number] DATE OF BIRTH:  08/25/1945  DATE OF PROCEDURE:  08/15/2014  PREOPERATIVE DIAGNOSES:  1.  End-stage renal disease.  2.  Poorly functioning left arm HeRO graft.  3.  Steal symptoms left upper extremity.  4.  Hypertension.  5.  Diabetes.  6.  Noninvasive study showing stenosis near arterial anastomosis.   POSTOPERATIVE DIAGNOSES: 1.  End-stage renal disease.  2.  Poorly functioning left arm HeRO graft.  3.  Steal symptoms left upper extremity.  4.  Hypertension.  5.  Diabetes.  6.  Noninvasive study showing stenosis near arterial anastomosis.   PROCEDURE:  1.  Ultrasound guidance for vascular access to left arm graft.  2.  Left upper extremity shuntogram.  3.  Catheter placement to left brachial artery from HeRO graft accessed in a retrograde direction.  4.  Left upper extremity angiogram.  5.  Percutaneous transluminal angioplasty with 5 mm diameter Lutonix drug-coated angioplasty balloon to the brachial artery up to and through the arterial anastomosis to the HeRO graft.   SURGEON: Algernon Huxley, M.D.   ANESTHESIA: Local with moderate conscious sedation.   ESTIMATED BLOOD LOSS: Minimal.   INDICATION FOR PROCEDURE: A 69 year old female with end-stage renal disease. She has had somewhat diminished flows of her graft on dialysis.  Sent over by her dialysis center.  On questioning and on previous office visit, she has described worsening steal symptoms of the left hand with tingling and numbness worsening on dialysis.  A noninvasive study had shown elevated velocities at the anastomosis. She has previously had a stent placed to the HeRO graft in the mid sections. We are performing an angiogram of the graft as well as left upper extremity for evaluation of her steal symptoms and to evaluate the function of the graft. The risks and benefits were discussed. Informed consent was obtained.   DESCRIPTION OF PROCEDURE: The patient is brought to the  vascular suite. The left upper extremity was sterilely prepped and draped and a sterile surgical field was created. The graft was accessed in the proximal upper arm beyond a previously placed stent in the midportion of the HeRO graft. This was done under ultrasound guidance without difficulty with a micropuncture needle and a permanent image was recorded. A micropuncture wire and sheath were then placed. Imaging demonstrated the central venous outflow portion to be patent.  There were some mild irregularity within the graft itself at the access sites just before the stent and just before the grommet connected to the silicone portion of the PTFE portion.  Neither of these appeared flow limiting.  With a Kumpe catheter placed at the anastomosis, there appeared to be some moderate narrowing at the anastomosis itself from intimal hyperplasia.  With the catheter advanced into the brachial artery to evaluate her steal symptoms, there was a moderate narrowing in the mid to distal brachial artery of 2 to 3 cm beyond the anastomosis possibly from a clamp at her previous surgery. This appeared to be in the 60 to 65% range. There was then what appeared to be a very small and diminutive ulnar artery with a patent radial and interosseous artery distally to the hand.  To try to treat her steal symptoms as well as the area of narrowing at the anastomosis itself, a single 5 mm diameter Lutonix drug-coated angioplasty balloon was inflated about 4 to 5 cm into the brachial artery from the anastomosis and back into the graft for about 2 cm.  The most waist was seen with the brachial portion of the graft. The balloon was inflated to 12 atmospheres and the inflation was held for 1 minute. The balloon was then deflated.  Kumpe catheter was placed back into the brachial artery and then into the initial portion of the graft to evaluate both the brachial artery and the graft. Both seemed significantly improved. The brachial artery had about  a 20 to 25% residual stenosis which was not flow limiting and the graft had brisk flow. At this point, I elected to terminate the procedure. The sheath was removed, 4-0 Monocryl purse string suture was placed. Pressure was held. Sterile dressing was placed. The patient tolerated the procedure well and was taken to the recovery room in stable condition.   ____________________________ Algernon Huxley, MD jsd:sp D: 08/15/2014 11:38:24 ET T: 08/15/2014 13:38:16 ET JOB#: [id number]  cc: Algernon Huxley, MD, <Dictator> Algernon Huxley MD ELECTRONICALLY SIGNED 08/25/2014 12:49

## 2014-09-06 DIAGNOSIS — N2581 Secondary hyperparathyroidism of renal origin: Secondary | ICD-10-CM | POA: Diagnosis not present

## 2014-09-06 DIAGNOSIS — N186 End stage renal disease: Secondary | ICD-10-CM | POA: Diagnosis not present

## 2014-09-06 DIAGNOSIS — D631 Anemia in chronic kidney disease: Secondary | ICD-10-CM | POA: Diagnosis not present

## 2014-09-08 DIAGNOSIS — D631 Anemia in chronic kidney disease: Secondary | ICD-10-CM | POA: Diagnosis not present

## 2014-09-08 DIAGNOSIS — N186 End stage renal disease: Secondary | ICD-10-CM | POA: Diagnosis not present

## 2014-09-08 DIAGNOSIS — N2581 Secondary hyperparathyroidism of renal origin: Secondary | ICD-10-CM | POA: Diagnosis not present

## 2014-09-10 DIAGNOSIS — D631 Anemia in chronic kidney disease: Secondary | ICD-10-CM | POA: Diagnosis not present

## 2014-09-10 DIAGNOSIS — N2581 Secondary hyperparathyroidism of renal origin: Secondary | ICD-10-CM | POA: Diagnosis not present

## 2014-09-10 DIAGNOSIS — N186 End stage renal disease: Secondary | ICD-10-CM | POA: Diagnosis not present

## 2014-09-13 DIAGNOSIS — N2581 Secondary hyperparathyroidism of renal origin: Secondary | ICD-10-CM | POA: Diagnosis not present

## 2014-09-13 DIAGNOSIS — N186 End stage renal disease: Secondary | ICD-10-CM | POA: Diagnosis not present

## 2014-09-13 DIAGNOSIS — D631 Anemia in chronic kidney disease: Secondary | ICD-10-CM | POA: Diagnosis not present

## 2014-09-14 DIAGNOSIS — H40033 Anatomical narrow angle, bilateral: Secondary | ICD-10-CM | POA: Diagnosis not present

## 2014-09-15 DIAGNOSIS — N186 End stage renal disease: Secondary | ICD-10-CM | POA: Diagnosis not present

## 2014-09-15 DIAGNOSIS — D631 Anemia in chronic kidney disease: Secondary | ICD-10-CM | POA: Diagnosis not present

## 2014-09-15 DIAGNOSIS — N2581 Secondary hyperparathyroidism of renal origin: Secondary | ICD-10-CM | POA: Diagnosis not present

## 2014-09-17 DIAGNOSIS — N2581 Secondary hyperparathyroidism of renal origin: Secondary | ICD-10-CM | POA: Diagnosis not present

## 2014-09-17 DIAGNOSIS — D631 Anemia in chronic kidney disease: Secondary | ICD-10-CM | POA: Diagnosis not present

## 2014-09-17 DIAGNOSIS — N186 End stage renal disease: Secondary | ICD-10-CM | POA: Diagnosis not present

## 2014-09-20 DIAGNOSIS — N2581 Secondary hyperparathyroidism of renal origin: Secondary | ICD-10-CM | POA: Diagnosis not present

## 2014-09-20 DIAGNOSIS — D631 Anemia in chronic kidney disease: Secondary | ICD-10-CM | POA: Diagnosis not present

## 2014-09-20 DIAGNOSIS — N186 End stage renal disease: Secondary | ICD-10-CM | POA: Diagnosis not present

## 2014-09-22 DIAGNOSIS — N2581 Secondary hyperparathyroidism of renal origin: Secondary | ICD-10-CM | POA: Diagnosis not present

## 2014-09-22 DIAGNOSIS — N186 End stage renal disease: Secondary | ICD-10-CM | POA: Diagnosis not present

## 2014-09-22 DIAGNOSIS — D631 Anemia in chronic kidney disease: Secondary | ICD-10-CM | POA: Diagnosis not present

## 2014-09-23 DIAGNOSIS — H40033 Anatomical narrow angle, bilateral: Secondary | ICD-10-CM | POA: Diagnosis not present

## 2014-09-24 DIAGNOSIS — N2581 Secondary hyperparathyroidism of renal origin: Secondary | ICD-10-CM | POA: Diagnosis not present

## 2014-09-24 DIAGNOSIS — N186 End stage renal disease: Secondary | ICD-10-CM | POA: Diagnosis not present

## 2014-09-24 DIAGNOSIS — D631 Anemia in chronic kidney disease: Secondary | ICD-10-CM | POA: Diagnosis not present

## 2014-09-27 DIAGNOSIS — D631 Anemia in chronic kidney disease: Secondary | ICD-10-CM | POA: Diagnosis not present

## 2014-09-27 DIAGNOSIS — N186 End stage renal disease: Secondary | ICD-10-CM | POA: Diagnosis not present

## 2014-09-27 DIAGNOSIS — N2581 Secondary hyperparathyroidism of renal origin: Secondary | ICD-10-CM | POA: Diagnosis not present

## 2014-09-29 DIAGNOSIS — N2581 Secondary hyperparathyroidism of renal origin: Secondary | ICD-10-CM | POA: Diagnosis not present

## 2014-09-29 DIAGNOSIS — N186 End stage renal disease: Secondary | ICD-10-CM | POA: Diagnosis not present

## 2014-09-29 DIAGNOSIS — D631 Anemia in chronic kidney disease: Secondary | ICD-10-CM | POA: Diagnosis not present

## 2014-10-01 DIAGNOSIS — D631 Anemia in chronic kidney disease: Secondary | ICD-10-CM | POA: Diagnosis not present

## 2014-10-01 DIAGNOSIS — N186 End stage renal disease: Secondary | ICD-10-CM | POA: Diagnosis not present

## 2014-10-01 DIAGNOSIS — N2581 Secondary hyperparathyroidism of renal origin: Secondary | ICD-10-CM | POA: Diagnosis not present

## 2014-10-04 DIAGNOSIS — E1122 Type 2 diabetes mellitus with diabetic chronic kidney disease: Secondary | ICD-10-CM | POA: Diagnosis not present

## 2014-10-04 DIAGNOSIS — N2581 Secondary hyperparathyroidism of renal origin: Secondary | ICD-10-CM | POA: Diagnosis not present

## 2014-10-04 DIAGNOSIS — Z992 Dependence on renal dialysis: Secondary | ICD-10-CM | POA: Diagnosis not present

## 2014-10-04 DIAGNOSIS — N186 End stage renal disease: Secondary | ICD-10-CM | POA: Diagnosis not present

## 2014-10-04 DIAGNOSIS — D631 Anemia in chronic kidney disease: Secondary | ICD-10-CM | POA: Diagnosis not present

## 2014-10-05 DIAGNOSIS — N186 End stage renal disease: Secondary | ICD-10-CM | POA: Diagnosis not present

## 2014-10-05 DIAGNOSIS — E119 Type 2 diabetes mellitus without complications: Secondary | ICD-10-CM | POA: Diagnosis not present

## 2014-10-05 DIAGNOSIS — Y841 Kidney dialysis as the cause of abnormal reaction of the patient, or of later complication, without mention of misadventure at the time of the procedure: Secondary | ICD-10-CM | POA: Diagnosis not present

## 2014-10-05 DIAGNOSIS — G458 Other transient cerebral ischemic attacks and related syndromes: Secondary | ICD-10-CM | POA: Diagnosis not present

## 2014-10-05 DIAGNOSIS — I10 Essential (primary) hypertension: Secondary | ICD-10-CM | POA: Diagnosis not present

## 2014-10-05 DIAGNOSIS — Z992 Dependence on renal dialysis: Secondary | ICD-10-CM | POA: Diagnosis not present

## 2014-10-05 DIAGNOSIS — I120 Hypertensive chronic kidney disease with stage 5 chronic kidney disease or end stage renal disease: Secondary | ICD-10-CM | POA: Diagnosis not present

## 2014-10-05 DIAGNOSIS — T82858A Stenosis of vascular prosthetic devices, implants and grafts, initial encounter: Secondary | ICD-10-CM | POA: Diagnosis not present

## 2014-10-06 DIAGNOSIS — D631 Anemia in chronic kidney disease: Secondary | ICD-10-CM | POA: Diagnosis not present

## 2014-10-06 DIAGNOSIS — D509 Iron deficiency anemia, unspecified: Secondary | ICD-10-CM | POA: Diagnosis not present

## 2014-10-06 DIAGNOSIS — N186 End stage renal disease: Secondary | ICD-10-CM | POA: Diagnosis not present

## 2014-10-06 DIAGNOSIS — N2581 Secondary hyperparathyroidism of renal origin: Secondary | ICD-10-CM | POA: Diagnosis not present

## 2014-10-08 DIAGNOSIS — N2581 Secondary hyperparathyroidism of renal origin: Secondary | ICD-10-CM | POA: Diagnosis not present

## 2014-10-08 DIAGNOSIS — D509 Iron deficiency anemia, unspecified: Secondary | ICD-10-CM | POA: Diagnosis not present

## 2014-10-08 DIAGNOSIS — D631 Anemia in chronic kidney disease: Secondary | ICD-10-CM | POA: Diagnosis not present

## 2014-10-08 DIAGNOSIS — N186 End stage renal disease: Secondary | ICD-10-CM | POA: Diagnosis not present

## 2014-10-11 DIAGNOSIS — N2581 Secondary hyperparathyroidism of renal origin: Secondary | ICD-10-CM | POA: Diagnosis not present

## 2014-10-11 DIAGNOSIS — D631 Anemia in chronic kidney disease: Secondary | ICD-10-CM | POA: Diagnosis not present

## 2014-10-11 DIAGNOSIS — N186 End stage renal disease: Secondary | ICD-10-CM | POA: Diagnosis not present

## 2014-10-11 DIAGNOSIS — D509 Iron deficiency anemia, unspecified: Secondary | ICD-10-CM | POA: Diagnosis not present

## 2014-10-12 DIAGNOSIS — Z86010 Personal history of colonic polyps: Secondary | ICD-10-CM | POA: Insufficient documentation

## 2014-10-13 DIAGNOSIS — D509 Iron deficiency anemia, unspecified: Secondary | ICD-10-CM | POA: Diagnosis not present

## 2014-10-13 DIAGNOSIS — N186 End stage renal disease: Secondary | ICD-10-CM | POA: Diagnosis not present

## 2014-10-13 DIAGNOSIS — D631 Anemia in chronic kidney disease: Secondary | ICD-10-CM | POA: Diagnosis not present

## 2014-10-13 DIAGNOSIS — N2581 Secondary hyperparathyroidism of renal origin: Secondary | ICD-10-CM | POA: Diagnosis not present

## 2014-10-15 DIAGNOSIS — N186 End stage renal disease: Secondary | ICD-10-CM | POA: Diagnosis not present

## 2014-10-15 DIAGNOSIS — N2581 Secondary hyperparathyroidism of renal origin: Secondary | ICD-10-CM | POA: Diagnosis not present

## 2014-10-15 DIAGNOSIS — D631 Anemia in chronic kidney disease: Secondary | ICD-10-CM | POA: Diagnosis not present

## 2014-10-15 DIAGNOSIS — D509 Iron deficiency anemia, unspecified: Secondary | ICD-10-CM | POA: Diagnosis not present

## 2014-10-18 DIAGNOSIS — N2581 Secondary hyperparathyroidism of renal origin: Secondary | ICD-10-CM | POA: Diagnosis not present

## 2014-10-18 DIAGNOSIS — D509 Iron deficiency anemia, unspecified: Secondary | ICD-10-CM | POA: Diagnosis not present

## 2014-10-18 DIAGNOSIS — D631 Anemia in chronic kidney disease: Secondary | ICD-10-CM | POA: Diagnosis not present

## 2014-10-18 DIAGNOSIS — N186 End stage renal disease: Secondary | ICD-10-CM | POA: Diagnosis not present

## 2014-10-20 DIAGNOSIS — N186 End stage renal disease: Secondary | ICD-10-CM | POA: Diagnosis not present

## 2014-10-20 DIAGNOSIS — D631 Anemia in chronic kidney disease: Secondary | ICD-10-CM | POA: Diagnosis not present

## 2014-10-20 DIAGNOSIS — D509 Iron deficiency anemia, unspecified: Secondary | ICD-10-CM | POA: Diagnosis not present

## 2014-10-20 DIAGNOSIS — N2581 Secondary hyperparathyroidism of renal origin: Secondary | ICD-10-CM | POA: Diagnosis not present

## 2014-10-22 DIAGNOSIS — D509 Iron deficiency anemia, unspecified: Secondary | ICD-10-CM | POA: Diagnosis not present

## 2014-10-22 DIAGNOSIS — N186 End stage renal disease: Secondary | ICD-10-CM | POA: Diagnosis not present

## 2014-10-22 DIAGNOSIS — D631 Anemia in chronic kidney disease: Secondary | ICD-10-CM | POA: Diagnosis not present

## 2014-10-22 DIAGNOSIS — N2581 Secondary hyperparathyroidism of renal origin: Secondary | ICD-10-CM | POA: Diagnosis not present

## 2014-10-25 DIAGNOSIS — N186 End stage renal disease: Secondary | ICD-10-CM | POA: Diagnosis not present

## 2014-10-25 DIAGNOSIS — D631 Anemia in chronic kidney disease: Secondary | ICD-10-CM | POA: Diagnosis not present

## 2014-10-25 DIAGNOSIS — D509 Iron deficiency anemia, unspecified: Secondary | ICD-10-CM | POA: Diagnosis not present

## 2014-10-25 DIAGNOSIS — N2581 Secondary hyperparathyroidism of renal origin: Secondary | ICD-10-CM | POA: Diagnosis not present

## 2014-10-27 DIAGNOSIS — N186 End stage renal disease: Secondary | ICD-10-CM | POA: Diagnosis not present

## 2014-10-27 DIAGNOSIS — N2581 Secondary hyperparathyroidism of renal origin: Secondary | ICD-10-CM | POA: Diagnosis not present

## 2014-10-27 DIAGNOSIS — D509 Iron deficiency anemia, unspecified: Secondary | ICD-10-CM | POA: Diagnosis not present

## 2014-10-27 DIAGNOSIS — D631 Anemia in chronic kidney disease: Secondary | ICD-10-CM | POA: Diagnosis not present

## 2014-10-29 DIAGNOSIS — N186 End stage renal disease: Secondary | ICD-10-CM | POA: Diagnosis not present

## 2014-10-29 DIAGNOSIS — N2581 Secondary hyperparathyroidism of renal origin: Secondary | ICD-10-CM | POA: Diagnosis not present

## 2014-10-29 DIAGNOSIS — D509 Iron deficiency anemia, unspecified: Secondary | ICD-10-CM | POA: Diagnosis not present

## 2014-10-29 DIAGNOSIS — D631 Anemia in chronic kidney disease: Secondary | ICD-10-CM | POA: Diagnosis not present

## 2014-11-01 DIAGNOSIS — D631 Anemia in chronic kidney disease: Secondary | ICD-10-CM | POA: Diagnosis not present

## 2014-11-01 DIAGNOSIS — N186 End stage renal disease: Secondary | ICD-10-CM | POA: Diagnosis not present

## 2014-11-01 DIAGNOSIS — N2581 Secondary hyperparathyroidism of renal origin: Secondary | ICD-10-CM | POA: Diagnosis not present

## 2014-11-01 DIAGNOSIS — D509 Iron deficiency anemia, unspecified: Secondary | ICD-10-CM | POA: Diagnosis not present

## 2014-11-02 DIAGNOSIS — N186 End stage renal disease: Secondary | ICD-10-CM | POA: Diagnosis not present

## 2014-11-02 DIAGNOSIS — Z992 Dependence on renal dialysis: Secondary | ICD-10-CM | POA: Diagnosis not present

## 2014-11-02 DIAGNOSIS — N261 Atrophy of kidney (terminal): Secondary | ICD-10-CM | POA: Diagnosis not present

## 2014-11-02 DIAGNOSIS — C641 Malignant neoplasm of right kidney, except renal pelvis: Secondary | ICD-10-CM | POA: Diagnosis not present

## 2014-11-02 DIAGNOSIS — Z905 Acquired absence of kidney: Secondary | ICD-10-CM | POA: Diagnosis not present

## 2014-11-02 DIAGNOSIS — C649 Malignant neoplasm of unspecified kidney, except renal pelvis: Secondary | ICD-10-CM | POA: Diagnosis not present

## 2014-11-03 DIAGNOSIS — N2581 Secondary hyperparathyroidism of renal origin: Secondary | ICD-10-CM | POA: Diagnosis not present

## 2014-11-03 DIAGNOSIS — E1122 Type 2 diabetes mellitus with diabetic chronic kidney disease: Secondary | ICD-10-CM | POA: Diagnosis not present

## 2014-11-03 DIAGNOSIS — N186 End stage renal disease: Secondary | ICD-10-CM | POA: Diagnosis not present

## 2014-11-03 DIAGNOSIS — D509 Iron deficiency anemia, unspecified: Secondary | ICD-10-CM | POA: Diagnosis not present

## 2014-11-03 DIAGNOSIS — Z992 Dependence on renal dialysis: Secondary | ICD-10-CM | POA: Diagnosis not present

## 2014-11-03 DIAGNOSIS — D631 Anemia in chronic kidney disease: Secondary | ICD-10-CM | POA: Diagnosis not present

## 2014-11-05 DIAGNOSIS — D509 Iron deficiency anemia, unspecified: Secondary | ICD-10-CM | POA: Diagnosis not present

## 2014-11-05 DIAGNOSIS — N2581 Secondary hyperparathyroidism of renal origin: Secondary | ICD-10-CM | POA: Diagnosis not present

## 2014-11-05 DIAGNOSIS — N186 End stage renal disease: Secondary | ICD-10-CM | POA: Diagnosis not present

## 2014-11-08 DIAGNOSIS — D509 Iron deficiency anemia, unspecified: Secondary | ICD-10-CM | POA: Diagnosis not present

## 2014-11-08 DIAGNOSIS — N2581 Secondary hyperparathyroidism of renal origin: Secondary | ICD-10-CM | POA: Diagnosis not present

## 2014-11-08 DIAGNOSIS — N186 End stage renal disease: Secondary | ICD-10-CM | POA: Diagnosis not present

## 2014-11-10 DIAGNOSIS — N186 End stage renal disease: Secondary | ICD-10-CM | POA: Diagnosis not present

## 2014-11-10 DIAGNOSIS — D509 Iron deficiency anemia, unspecified: Secondary | ICD-10-CM | POA: Diagnosis not present

## 2014-11-10 DIAGNOSIS — N2581 Secondary hyperparathyroidism of renal origin: Secondary | ICD-10-CM | POA: Diagnosis not present

## 2014-11-12 DIAGNOSIS — N2581 Secondary hyperparathyroidism of renal origin: Secondary | ICD-10-CM | POA: Diagnosis not present

## 2014-11-12 DIAGNOSIS — N186 End stage renal disease: Secondary | ICD-10-CM | POA: Diagnosis not present

## 2014-11-12 DIAGNOSIS — D509 Iron deficiency anemia, unspecified: Secondary | ICD-10-CM | POA: Diagnosis not present

## 2014-11-15 DIAGNOSIS — N186 End stage renal disease: Secondary | ICD-10-CM | POA: Diagnosis not present

## 2014-11-15 DIAGNOSIS — D509 Iron deficiency anemia, unspecified: Secondary | ICD-10-CM | POA: Diagnosis not present

## 2014-11-15 DIAGNOSIS — N2581 Secondary hyperparathyroidism of renal origin: Secondary | ICD-10-CM | POA: Diagnosis not present

## 2014-11-17 DIAGNOSIS — D509 Iron deficiency anemia, unspecified: Secondary | ICD-10-CM | POA: Diagnosis not present

## 2014-11-17 DIAGNOSIS — N2581 Secondary hyperparathyroidism of renal origin: Secondary | ICD-10-CM | POA: Diagnosis not present

## 2014-11-17 DIAGNOSIS — N186 End stage renal disease: Secondary | ICD-10-CM | POA: Diagnosis not present

## 2014-11-19 DIAGNOSIS — N186 End stage renal disease: Secondary | ICD-10-CM | POA: Diagnosis not present

## 2014-11-19 DIAGNOSIS — D509 Iron deficiency anemia, unspecified: Secondary | ICD-10-CM | POA: Diagnosis not present

## 2014-11-19 DIAGNOSIS — N2581 Secondary hyperparathyroidism of renal origin: Secondary | ICD-10-CM | POA: Diagnosis not present

## 2014-11-22 DIAGNOSIS — N186 End stage renal disease: Secondary | ICD-10-CM | POA: Diagnosis not present

## 2014-11-22 DIAGNOSIS — N2581 Secondary hyperparathyroidism of renal origin: Secondary | ICD-10-CM | POA: Diagnosis not present

## 2014-11-22 DIAGNOSIS — D509 Iron deficiency anemia, unspecified: Secondary | ICD-10-CM | POA: Diagnosis not present

## 2014-11-24 DIAGNOSIS — N186 End stage renal disease: Secondary | ICD-10-CM | POA: Diagnosis not present

## 2014-11-24 DIAGNOSIS — N2581 Secondary hyperparathyroidism of renal origin: Secondary | ICD-10-CM | POA: Diagnosis not present

## 2014-11-24 DIAGNOSIS — D509 Iron deficiency anemia, unspecified: Secondary | ICD-10-CM | POA: Diagnosis not present

## 2014-11-25 ENCOUNTER — Encounter: Payer: Self-pay | Admitting: *Deleted

## 2014-11-26 DIAGNOSIS — N2581 Secondary hyperparathyroidism of renal origin: Secondary | ICD-10-CM | POA: Diagnosis not present

## 2014-11-26 DIAGNOSIS — D509 Iron deficiency anemia, unspecified: Secondary | ICD-10-CM | POA: Diagnosis not present

## 2014-11-26 DIAGNOSIS — N186 End stage renal disease: Secondary | ICD-10-CM | POA: Diagnosis not present

## 2014-11-28 ENCOUNTER — Ambulatory Visit
Admission: RE | Admit: 2014-11-28 | Discharge: 2014-11-28 | Disposition: A | Discharge status: Home / Self Care | Payer: Medicare Other | Source: Ambulatory Visit | Attending: Unknown Physician Specialty | Admitting: Unknown Physician Specialty

## 2014-11-28 ENCOUNTER — Ambulatory Visit: Payer: Medicare Other | Admitting: Anesthesiology

## 2014-11-28 ENCOUNTER — Encounter
Admission: RE | Disposition: A | Discharge status: Home / Self Care | Payer: Self-pay | Source: Ambulatory Visit | Attending: Unknown Physician Specialty

## 2014-11-28 ENCOUNTER — Encounter: Payer: Self-pay | Admitting: *Deleted

## 2014-11-28 DIAGNOSIS — K648 Other hemorrhoids: Secondary | ICD-10-CM | POA: Insufficient documentation

## 2014-11-28 DIAGNOSIS — Z86010 Personal history of colonic polyps: Secondary | ICD-10-CM | POA: Diagnosis not present

## 2014-11-28 DIAGNOSIS — I120 Hypertensive chronic kidney disease with stage 5 chronic kidney disease or end stage renal disease: Secondary | ICD-10-CM | POA: Diagnosis not present

## 2014-11-28 DIAGNOSIS — K635 Polyp of colon: Secondary | ICD-10-CM | POA: Diagnosis not present

## 2014-11-28 DIAGNOSIS — Z1211 Encounter for screening for malignant neoplasm of colon: Secondary | ICD-10-CM | POA: Insufficient documentation

## 2014-11-28 DIAGNOSIS — N186 End stage renal disease: Secondary | ICD-10-CM | POA: Diagnosis not present

## 2014-11-28 DIAGNOSIS — Z951 Presence of aortocoronary bypass graft: Secondary | ICD-10-CM | POA: Diagnosis not present

## 2014-11-28 DIAGNOSIS — D123 Benign neoplasm of transverse colon: Secondary | ICD-10-CM | POA: Diagnosis not present

## 2014-11-28 DIAGNOSIS — E1122 Type 2 diabetes mellitus with diabetic chronic kidney disease: Secondary | ICD-10-CM | POA: Insufficient documentation

## 2014-11-28 DIAGNOSIS — D649 Anemia, unspecified: Secondary | ICD-10-CM | POA: Diagnosis not present

## 2014-11-28 DIAGNOSIS — I2510 Atherosclerotic heart disease of native coronary artery without angina pectoris: Secondary | ICD-10-CM | POA: Insufficient documentation

## 2014-11-28 DIAGNOSIS — K640 First degree hemorrhoids: Secondary | ICD-10-CM | POA: Diagnosis not present

## 2014-11-28 HISTORY — DX: Cough: R05

## 2014-11-28 HISTORY — DX: Atherosclerotic heart disease of native coronary artery without angina pectoris: I25.10

## 2014-11-28 HISTORY — DX: Chronic cough: R05.3

## 2014-11-28 HISTORY — DX: Anemia, unspecified: D64.9

## 2014-11-28 HISTORY — DX: Essential (primary) hypertension: I10

## 2014-11-28 HISTORY — DX: Chronic kidney disease, unspecified: N18.9

## 2014-11-28 HISTORY — PX: COLONOSCOPY WITH PROPOFOL: SHX5780

## 2014-11-28 HISTORY — DX: Type 2 diabetes mellitus without complications: E11.9

## 2014-11-28 HISTORY — DX: Gastro-esophageal reflux disease without esophagitis: K21.9

## 2014-11-28 LAB — GLUCOSE, CAPILLARY: Glucose-Capillary: 97 mg/dL (ref 65–99)

## 2014-11-28 SURGERY — COLONOSCOPY WITH PROPOFOL
Anesthesia: General

## 2014-11-28 MED ORDER — FENTANYL CITRATE (PF) 100 MCG/2ML IJ SOLN
INTRAMUSCULAR | Status: DC | PRN
  Administered 2014-11-28: 50 ug via INTRAVENOUS

## 2014-11-28 MED ORDER — SODIUM CHLORIDE 0.9 % IV SOLN: INTRAVENOUS | Status: DC

## 2014-11-28 MED ORDER — PROPOFOL INFUSION 10 MG/ML OPTIME
INTRAVENOUS | Status: DC | PRN
  Administered 2014-11-28: 120 ug/kg/min via INTRAVENOUS

## 2014-11-28 MED ORDER — MIDAZOLAM HCL 2 MG/2ML IJ SOLN
INTRAMUSCULAR | Status: DC | PRN
  Administered 2014-11-28: 1 mg via INTRAVENOUS

## 2014-11-28 MED ORDER — EPHEDRINE SULFATE 50 MG/ML IJ SOLN
INTRAMUSCULAR | Status: DC | PRN
  Administered 2014-11-28: 10 mg via INTRAVENOUS

## 2014-11-28 MED ORDER — PHENYLEPHRINE HCL 10 MG/ML IJ SOLN
INTRAMUSCULAR | Status: DC | PRN
  Administered 2014-11-28: 50 ug via INTRAVENOUS

## 2014-11-28 MED ORDER — SODIUM CHLORIDE 0.9 % IV SOLN
INTRAVENOUS | Status: DC
  Administered 2014-11-28: 1000 mL via INTRAVENOUS

## 2014-11-28 NOTE — Anesthesia Postprocedure Evaluation (Signed)
  Anesthesia Post-op Note  Patient: Kelli Nguyen  Procedure(s) Performed: Procedure(s): COLONOSCOPY WITH PROPOFOL (N/A)  Anesthesia type:General  Patient location: PACU  Post pain: Pain level controlled  Post assessment: Post-op Vital signs reviewed, Patient's Cardiovascular Status Stable, Respiratory Function Stable, Patent Airway and No signs of Nausea or vomiting  Post vital signs: Reviewed and stable  Last Vitals:  Filed Vitals:   11/28/14 1040  BP: 116/69  Pulse: 77  Temp:   Resp: 15    Level of consciousness: awake, alert  and patient cooperative  Complications: No apparent anesthesia complications

## 2014-11-28 NOTE — Transfer of Care (Signed)
Immediate Anesthesia Transfer of Care Note  Patient: Kelli Nguyen  Procedure(s) Performed: Procedure(s): COLONOSCOPY WITH PROPOFOL (N/A)  Patient Location: PACU  Anesthesia Type:General  Level of Consciousness: awake and sedated  Airway & Oxygen Therapy: Patient Spontanous Breathing and Patient connected to nasal cannula oxygen  Post-op Assessment: Report given to RN and Post -op Vital signs reviewed and stable  Post vital signs: Reviewed and stable  Last Vitals:  Filed Vitals:   11/28/14 0850  BP: 121/37  Pulse: 76  Temp: 36.1 C  Resp: 20    Complications: No apparent anesthesia complications

## 2014-11-28 NOTE — H&P (Signed)
Primary Care Physician:  No PCP Per Patient Primary Gastroenterologist:  Dr. Vira Agar  Pre-Procedure History & Physical: HPI:  Kelli Nguyen is a 69 y.o. female is here for an colonoscopy.   Past Medical History  Diagnosis Date  . Anemia   . Hypertension   . Diabetes mellitus without complication   . Chronic kidney disease   . GERD (gastroesophageal reflux disease)   . Coronary artery disease   . Chronic cough     Past Surgical History  Procedure Laterality Date  . Abdominal hysterectomy    . Coronary artery bypass graft    . Colonoscopy    . Renal shunts      Prior to Admission medications   Medication Sig Start Date End Date Taking? Authorizing Provider  aspirin 81 MG tablet Take 81 mg by mouth daily.   Yes Historical Provider, MD  cinacalcet (SENSIPAR) 30 MG tablet Take 30 mg by mouth daily.   Yes Historical Provider, MD  isosorbide mononitrate (IMDUR) 30 MG 24 hr tablet Take 30 mg by mouth daily.   Yes Historical Provider, MD  metoprolol succinate (TOPROL-XL) 25 MG 24 hr tablet Take 25 mg by mouth daily.   Yes Historical Provider, MD  pravastatin (PRAVACHOL) 20 MG tablet Take 20 mg by mouth daily.   Yes Historical Provider, MD    Allergies as of 10/26/2014  . (Not on File)    History reviewed. No pertinent family history.  History   Social History  . Marital Status: Widowed    Spouse Name: N/A  . Number of Children: N/A  . Years of Education: N/A   Occupational History  . Not on file.   Social History Main Topics  . Smoking status: Never Smoker   . Smokeless tobacco: Never Used  . Alcohol Use: No  . Drug Use: Not on file  . Sexual Activity: Not on file   Other Topics Concern  . Not on file   Social History Narrative    Review of Systems: See HPI, otherwise negative ROS  Physical Exam: BP 121/37 mmHg  Pulse 76  Temp(Src) 97 F (36.1 C) (Tympanic)  Resp 20  Ht 4\' 11"  (1.499 m)  Wt 83.915 kg (185 lb)  BMI 37.35 kg/m2  SpO2  100% General:   Alert,  pleasant and cooperative in NAD Head:  Normocephalic and atraumatic. Neck:  Supple; no masses or thyromegaly. Lungs:  Clear throughout to auscultation.    Heart:  Regular rate and rhythm. Abdomen:  Soft, nontender and nondistended. Normal bowel sounds, without guarding, and without rebound.   Neurologic:  Alert and  oriented x4;  grossly normal neurologically.  Impression/Plan: Kelli Nguyen is here for an colonoscopy to be performed for personal history of colon polyps  Risks, benefits, limitations, and alternatives regarding  colonoscopy have been reviewed with the patient.  Questions have been answered.  All parties agreeable.   Gaylyn Cheers, MD  11/28/2014, 9:29 AM   Primary Care Physician:  No PCP Per Patient Primary Gastroenterologist:  Dr. Vira Agar  Pre-Procedure History & Physical: HPI:  Kelli Nguyen is a 69 y.o. female is here for an colonoscopy.   Past Medical History  Diagnosis Date  . Anemia   . Hypertension   . Diabetes mellitus without complication   . Chronic kidney disease   . GERD (gastroesophageal reflux disease)   . Coronary artery disease   . Chronic cough     Past Surgical History  Procedure Laterality Date  .  Abdominal hysterectomy    . Coronary artery bypass graft    . Colonoscopy    . Renal shunts      Prior to Admission medications   Medication Sig Start Date End Date Taking? Authorizing Provider  aspirin 81 MG tablet Take 81 mg by mouth daily.   Yes Historical Provider, MD  cinacalcet (SENSIPAR) 30 MG tablet Take 30 mg by mouth daily.   Yes Historical Provider, MD  isosorbide mononitrate (IMDUR) 30 MG 24 hr tablet Take 30 mg by mouth daily.   Yes Historical Provider, MD  metoprolol succinate (TOPROL-XL) 25 MG 24 hr tablet Take 25 mg by mouth daily.   Yes Historical Provider, MD  pravastatin (PRAVACHOL) 20 MG tablet Take 20 mg by mouth daily.   Yes Historical Provider, MD    Allergies as of 10/26/2014  . (Not  on File)    History reviewed. No pertinent family history.  History   Social History  . Marital Status: Widowed    Spouse Name: N/A  . Number of Children: N/A  . Years of Education: N/A   Occupational History  . Not on file.   Social History Main Topics  . Smoking status: Never Smoker   . Smokeless tobacco: Never Used  . Alcohol Use: No  . Drug Use: Not on file  . Sexual Activity: Not on file   Other Topics Concern  . Not on file   Social History Narrative    Review of Systems: See HPI, otherwise negative ROS  Physical Exam: BP 121/37 mmHg  Pulse 76  Temp(Src) 97 F (36.1 C) (Tympanic)  Resp 20  Ht 4\' 11"  (1.499 m)  Wt 83.915 kg (185 lb)  BMI 37.35 kg/m2  SpO2 100% General:   Alert,  pleasant and cooperative in NAD Head:  Normocephalic and atraumatic. Neck:  Supple; no masses or thyromegaly. Lungs:  Clear throughout to auscultation.    Heart:  Regular rate and rhythm. Abdomen:  Soft, nontender and nondistended. Normal bowel sounds, without guarding, and without rebound.   Neurologic:  Alert and  oriented x4;  grossly normal neurologically.  Impression/Plan: Kelli Nguyen is here for an colonoscopy to be performed for personal history of colon polyps  Risks, benefits, limitations, and alternatives regarding  colonoscopy have been reviewed with the patient.  Questions have been answered.  All parties agreeable.   Gaylyn Cheers, MD  11/28/2014, 9:29 AM

## 2014-11-28 NOTE — Op Note (Signed)
[facility] Gastroenterology Patient Name: Kelli Nguyen Procedure Date: 11/28/2014 9:28 AM MRN: [id number] Account #: [id number] Date of Birth: [birth year] Admit Type: Outpatient Age: 68 Room: [facility] ENDO ROOM 1 Gender: Female Note Status: Finalized Procedure:         Colonoscopy Indications:       Personal history of colonic polyps Providers:         Manya Silvas, MD Referring MD:      No Local Md, MD (Referring MD) Medicines:         Propofol per Anesthesia Complications:     No immediate complications. Procedure:         Pre-Anesthesia Assessment:                    - After reviewing the risks and benefits, the patient was                     deemed in satisfactory condition to undergo the procedure.                    After obtaining informed consent, the colonoscope was                     passed under direct vision. Throughout the procedure, the                     patient's blood pressure, pulse, and oxygen saturations                     were monitored continuously. The Olympus PCF-160AL                     colonoscope (S#. [id number]) was introduced through the anus                     and advanced to the the cecum, identified by appendiceal                     orifice and ileocecal valve. The colonoscopy was performed                     without difficulty. The quality of the bowel preparation                     was adequate to identify polyps. Findings:      Two sessile polyps were found in the transverse colon. The polyps were       diminutive in size. These polyps were removed with a jumbo cold forceps.       Resection and retrieval were complete.      Internal hemorrhoids were found during endoscopy. The hemorrhoids were       small and Grade I (internal hemorrhoids that do not prolapse).      The exam was otherwise without abnormality. Impression:        - Two diminutive polyps in the transverse colon. Resected                     and retrieved.                    - Internal hemorrhoids.                    - The examination was otherwise normal. Recommendation:    - Await pathology  results. Probably repeat in 5 years. Manya Silvas, MD 11/28/2014 10:06:12 AM This report has been signed electronically. Number of Addenda: 0 Note Initiated On: 11/28/2014 9:28 AM Total Procedure Duration: 0 hours 22 minutes 50 seconds       [facility]

## 2014-11-28 NOTE — Anesthesia Preprocedure Evaluation (Signed)
Anesthesia Evaluation  Patient identified by MRN, date of birth, ID band Patient awake    Reviewed: Allergy & Precautions, NPO status , Patient's Chart, lab work & pertinent test results  History of Anesthesia Complications Negative for: history of anesthetic complications  Airway Mallampati: III  TM Distance: >3 FB Neck ROM: Full    Dental  (+) Partial Lower, Partial Upper   Pulmonary          Cardiovascular hypertension, Pt. on medications and Pt. on home beta blockers + CAD (CABG)     Neuro/Psych    GI/Hepatic   Endo/Other  diabetes  Renal/GU ESRF and DialysisRenal disease     Musculoskeletal   Abdominal   Peds  Hematology  (+) anemia ,   Anesthesia Other Findings   Reproductive/Obstetrics                             Anesthesia Physical Anesthesia Plan  ASA: IV  Anesthesia Plan: General   Post-op Pain Management:    Induction: Intravenous  Airway Management Planned: Nasal Cannula  Additional Equipment:   Intra-op Plan:   Post-operative Plan:   Informed Consent: I have reviewed the patients History and Physical, chart, labs and discussed the procedure including the risks, benefits and alternatives for the proposed anesthesia with the patient or authorized representative who has indicated his/her understanding and acceptance.     Plan Discussed with:   Anesthesia Plan Comments:         Anesthesia Quick Evaluation

## 2014-11-29 ENCOUNTER — Encounter: Payer: Self-pay | Admitting: Unknown Physician Specialty

## 2014-11-29 DIAGNOSIS — D509 Iron deficiency anemia, unspecified: Secondary | ICD-10-CM | POA: Diagnosis not present

## 2014-11-29 DIAGNOSIS — N186 End stage renal disease: Secondary | ICD-10-CM | POA: Diagnosis not present

## 2014-11-29 DIAGNOSIS — N2581 Secondary hyperparathyroidism of renal origin: Secondary | ICD-10-CM | POA: Diagnosis not present

## 2014-11-30 LAB — SURGICAL PATHOLOGY

## 2014-12-01 DIAGNOSIS — I2510 Atherosclerotic heart disease of native coronary artery without angina pectoris: Secondary | ICD-10-CM | POA: Diagnosis not present

## 2014-12-01 DIAGNOSIS — N186 End stage renal disease: Secondary | ICD-10-CM | POA: Diagnosis not present

## 2014-12-01 DIAGNOSIS — Z905 Acquired absence of kidney: Secondary | ICD-10-CM | POA: Diagnosis not present

## 2014-12-01 DIAGNOSIS — K219 Gastro-esophageal reflux disease without esophagitis: Secondary | ICD-10-CM | POA: Diagnosis not present

## 2014-12-01 DIAGNOSIS — N2581 Secondary hyperparathyroidism of renal origin: Secondary | ICD-10-CM | POA: Diagnosis not present

## 2014-12-01 DIAGNOSIS — Z992 Dependence on renal dialysis: Secondary | ICD-10-CM | POA: Diagnosis not present

## 2014-12-01 DIAGNOSIS — Z885 Allergy status to narcotic agent status: Secondary | ICD-10-CM | POA: Diagnosis not present

## 2014-12-01 DIAGNOSIS — D509 Iron deficiency anemia, unspecified: Secondary | ICD-10-CM | POA: Diagnosis not present

## 2014-12-01 DIAGNOSIS — Z7982 Long term (current) use of aspirin: Secondary | ICD-10-CM | POA: Diagnosis not present

## 2014-12-01 DIAGNOSIS — E119 Type 2 diabetes mellitus without complications: Secondary | ICD-10-CM | POA: Diagnosis not present

## 2014-12-01 DIAGNOSIS — Z79899 Other long term (current) drug therapy: Secondary | ICD-10-CM | POA: Diagnosis not present

## 2014-12-01 DIAGNOSIS — I120 Hypertensive chronic kidney disease with stage 5 chronic kidney disease or end stage renal disease: Secondary | ICD-10-CM | POA: Diagnosis not present

## 2014-12-01 DIAGNOSIS — Z951 Presence of aortocoronary bypass graft: Secondary | ICD-10-CM | POA: Diagnosis not present

## 2014-12-01 DIAGNOSIS — M85861 Other specified disorders of bone density and structure, right lower leg: Secondary | ICD-10-CM | POA: Diagnosis not present

## 2014-12-01 DIAGNOSIS — M21061 Valgus deformity, not elsewhere classified, right knee: Secondary | ICD-10-CM | POA: Diagnosis not present

## 2014-12-01 DIAGNOSIS — M8580 Other specified disorders of bone density and structure, unspecified site: Secondary | ICD-10-CM | POA: Diagnosis not present

## 2014-12-01 DIAGNOSIS — M1991 Primary osteoarthritis, unspecified site: Secondary | ICD-10-CM | POA: Diagnosis not present

## 2014-12-01 DIAGNOSIS — M25561 Pain in right knee: Secondary | ICD-10-CM | POA: Diagnosis not present

## 2014-12-01 DIAGNOSIS — M1711 Unilateral primary osteoarthritis, right knee: Secondary | ICD-10-CM | POA: Diagnosis not present

## 2014-12-03 DIAGNOSIS — N2581 Secondary hyperparathyroidism of renal origin: Secondary | ICD-10-CM | POA: Diagnosis not present

## 2014-12-03 DIAGNOSIS — D509 Iron deficiency anemia, unspecified: Secondary | ICD-10-CM | POA: Diagnosis not present

## 2014-12-03 DIAGNOSIS — N186 End stage renal disease: Secondary | ICD-10-CM | POA: Diagnosis not present

## 2014-12-04 DIAGNOSIS — N186 End stage renal disease: Secondary | ICD-10-CM | POA: Diagnosis not present

## 2014-12-04 DIAGNOSIS — Z992 Dependence on renal dialysis: Secondary | ICD-10-CM | POA: Diagnosis not present

## 2014-12-04 DIAGNOSIS — E1122 Type 2 diabetes mellitus with diabetic chronic kidney disease: Secondary | ICD-10-CM | POA: Diagnosis not present

## 2014-12-06 DIAGNOSIS — N186 End stage renal disease: Secondary | ICD-10-CM | POA: Diagnosis not present

## 2014-12-06 DIAGNOSIS — N2581 Secondary hyperparathyroidism of renal origin: Secondary | ICD-10-CM | POA: Diagnosis not present

## 2014-12-06 DIAGNOSIS — D509 Iron deficiency anemia, unspecified: Secondary | ICD-10-CM | POA: Diagnosis not present

## 2014-12-08 DIAGNOSIS — D509 Iron deficiency anemia, unspecified: Secondary | ICD-10-CM | POA: Diagnosis not present

## 2014-12-08 DIAGNOSIS — N186 End stage renal disease: Secondary | ICD-10-CM | POA: Diagnosis not present

## 2014-12-08 DIAGNOSIS — N2581 Secondary hyperparathyroidism of renal origin: Secondary | ICD-10-CM | POA: Diagnosis not present

## 2014-12-10 DIAGNOSIS — N186 End stage renal disease: Secondary | ICD-10-CM | POA: Diagnosis not present

## 2014-12-10 DIAGNOSIS — D509 Iron deficiency anemia, unspecified: Secondary | ICD-10-CM | POA: Diagnosis not present

## 2014-12-10 DIAGNOSIS — N2581 Secondary hyperparathyroidism of renal origin: Secondary | ICD-10-CM | POA: Diagnosis not present

## 2014-12-13 DIAGNOSIS — N2581 Secondary hyperparathyroidism of renal origin: Secondary | ICD-10-CM | POA: Diagnosis not present

## 2014-12-13 DIAGNOSIS — D509 Iron deficiency anemia, unspecified: Secondary | ICD-10-CM | POA: Diagnosis not present

## 2014-12-13 DIAGNOSIS — N186 End stage renal disease: Secondary | ICD-10-CM | POA: Diagnosis not present

## 2014-12-15 DIAGNOSIS — D509 Iron deficiency anemia, unspecified: Secondary | ICD-10-CM | POA: Diagnosis not present

## 2014-12-15 DIAGNOSIS — N2581 Secondary hyperparathyroidism of renal origin: Secondary | ICD-10-CM | POA: Diagnosis not present

## 2014-12-15 DIAGNOSIS — N186 End stage renal disease: Secondary | ICD-10-CM | POA: Diagnosis not present

## 2014-12-17 DIAGNOSIS — N186 End stage renal disease: Secondary | ICD-10-CM | POA: Diagnosis not present

## 2014-12-17 DIAGNOSIS — N2581 Secondary hyperparathyroidism of renal origin: Secondary | ICD-10-CM | POA: Diagnosis not present

## 2014-12-17 DIAGNOSIS — D509 Iron deficiency anemia, unspecified: Secondary | ICD-10-CM | POA: Diagnosis not present

## 2014-12-20 DIAGNOSIS — N2581 Secondary hyperparathyroidism of renal origin: Secondary | ICD-10-CM | POA: Diagnosis not present

## 2014-12-20 DIAGNOSIS — N186 End stage renal disease: Secondary | ICD-10-CM | POA: Diagnosis not present

## 2014-12-20 DIAGNOSIS — D509 Iron deficiency anemia, unspecified: Secondary | ICD-10-CM | POA: Diagnosis not present

## 2014-12-22 DIAGNOSIS — N2581 Secondary hyperparathyroidism of renal origin: Secondary | ICD-10-CM | POA: Diagnosis not present

## 2014-12-22 DIAGNOSIS — D509 Iron deficiency anemia, unspecified: Secondary | ICD-10-CM | POA: Diagnosis not present

## 2014-12-22 DIAGNOSIS — N186 End stage renal disease: Secondary | ICD-10-CM | POA: Diagnosis not present

## 2014-12-24 DIAGNOSIS — N2581 Secondary hyperparathyroidism of renal origin: Secondary | ICD-10-CM | POA: Diagnosis not present

## 2014-12-24 DIAGNOSIS — D509 Iron deficiency anemia, unspecified: Secondary | ICD-10-CM | POA: Diagnosis not present

## 2014-12-24 DIAGNOSIS — N186 End stage renal disease: Secondary | ICD-10-CM | POA: Diagnosis not present

## 2014-12-27 DIAGNOSIS — N186 End stage renal disease: Secondary | ICD-10-CM | POA: Diagnosis not present

## 2014-12-27 DIAGNOSIS — N2581 Secondary hyperparathyroidism of renal origin: Secondary | ICD-10-CM | POA: Diagnosis not present

## 2014-12-27 DIAGNOSIS — D509 Iron deficiency anemia, unspecified: Secondary | ICD-10-CM | POA: Diagnosis not present

## 2014-12-29 DIAGNOSIS — D509 Iron deficiency anemia, unspecified: Secondary | ICD-10-CM | POA: Diagnosis not present

## 2014-12-29 DIAGNOSIS — N186 End stage renal disease: Secondary | ICD-10-CM | POA: Diagnosis not present

## 2014-12-29 DIAGNOSIS — N2581 Secondary hyperparathyroidism of renal origin: Secondary | ICD-10-CM | POA: Diagnosis not present

## 2014-12-31 DIAGNOSIS — N2581 Secondary hyperparathyroidism of renal origin: Secondary | ICD-10-CM | POA: Diagnosis not present

## 2014-12-31 DIAGNOSIS — D509 Iron deficiency anemia, unspecified: Secondary | ICD-10-CM | POA: Diagnosis not present

## 2014-12-31 DIAGNOSIS — N186 End stage renal disease: Secondary | ICD-10-CM | POA: Diagnosis not present

## 2015-01-03 DIAGNOSIS — N2581 Secondary hyperparathyroidism of renal origin: Secondary | ICD-10-CM | POA: Diagnosis not present

## 2015-01-03 DIAGNOSIS — N186 End stage renal disease: Secondary | ICD-10-CM | POA: Diagnosis not present

## 2015-01-03 DIAGNOSIS — D509 Iron deficiency anemia, unspecified: Secondary | ICD-10-CM | POA: Diagnosis not present

## 2015-01-04 DIAGNOSIS — N186 End stage renal disease: Secondary | ICD-10-CM | POA: Diagnosis not present

## 2015-01-04 DIAGNOSIS — E1122 Type 2 diabetes mellitus with diabetic chronic kidney disease: Secondary | ICD-10-CM | POA: Diagnosis not present

## 2015-01-04 DIAGNOSIS — Z992 Dependence on renal dialysis: Secondary | ICD-10-CM | POA: Diagnosis not present

## 2015-01-05 DIAGNOSIS — Z23 Encounter for immunization: Secondary | ICD-10-CM | POA: Diagnosis not present

## 2015-01-05 DIAGNOSIS — N2581 Secondary hyperparathyroidism of renal origin: Secondary | ICD-10-CM | POA: Diagnosis not present

## 2015-01-05 DIAGNOSIS — N186 End stage renal disease: Secondary | ICD-10-CM | POA: Diagnosis not present

## 2015-01-05 DIAGNOSIS — D509 Iron deficiency anemia, unspecified: Secondary | ICD-10-CM | POA: Diagnosis not present

## 2015-01-07 DIAGNOSIS — Z23 Encounter for immunization: Secondary | ICD-10-CM | POA: Diagnosis not present

## 2015-01-07 DIAGNOSIS — N186 End stage renal disease: Secondary | ICD-10-CM | POA: Diagnosis not present

## 2015-01-07 DIAGNOSIS — N2581 Secondary hyperparathyroidism of renal origin: Secondary | ICD-10-CM | POA: Diagnosis not present

## 2015-01-07 DIAGNOSIS — D509 Iron deficiency anemia, unspecified: Secondary | ICD-10-CM | POA: Diagnosis not present

## 2015-01-10 DIAGNOSIS — N186 End stage renal disease: Secondary | ICD-10-CM | POA: Diagnosis not present

## 2015-01-10 DIAGNOSIS — Z23 Encounter for immunization: Secondary | ICD-10-CM | POA: Diagnosis not present

## 2015-01-10 DIAGNOSIS — D509 Iron deficiency anemia, unspecified: Secondary | ICD-10-CM | POA: Diagnosis not present

## 2015-01-10 DIAGNOSIS — N2581 Secondary hyperparathyroidism of renal origin: Secondary | ICD-10-CM | POA: Diagnosis not present

## 2015-01-11 ENCOUNTER — Other Ambulatory Visit: Payer: Self-pay | Admitting: Vascular Surgery

## 2015-01-11 DIAGNOSIS — Y841 Kidney dialysis as the cause of abnormal reaction of the patient, or of later complication, without mention of misadventure at the time of the procedure: Secondary | ICD-10-CM | POA: Diagnosis not present

## 2015-01-11 DIAGNOSIS — N186 End stage renal disease: Secondary | ICD-10-CM | POA: Diagnosis not present

## 2015-01-11 DIAGNOSIS — T82858A Stenosis of vascular prosthetic devices, implants and grafts, initial encounter: Secondary | ICD-10-CM | POA: Diagnosis not present

## 2015-01-11 DIAGNOSIS — G458 Other transient cerebral ischemic attacks and related syndromes: Secondary | ICD-10-CM | POA: Diagnosis not present

## 2015-01-11 DIAGNOSIS — T82318A Breakdown (mechanical) of other vascular grafts, initial encounter: Secondary | ICD-10-CM | POA: Diagnosis not present

## 2015-01-11 DIAGNOSIS — Z992 Dependence on renal dialysis: Secondary | ICD-10-CM | POA: Diagnosis not present

## 2015-01-11 DIAGNOSIS — I120 Hypertensive chronic kidney disease with stage 5 chronic kidney disease or end stage renal disease: Secondary | ICD-10-CM | POA: Diagnosis not present

## 2015-01-11 DIAGNOSIS — E119 Type 2 diabetes mellitus without complications: Secondary | ICD-10-CM | POA: Diagnosis not present

## 2015-01-11 DIAGNOSIS — I10 Essential (primary) hypertension: Secondary | ICD-10-CM | POA: Diagnosis not present

## 2015-01-12 DIAGNOSIS — N186 End stage renal disease: Secondary | ICD-10-CM | POA: Diagnosis not present

## 2015-01-12 DIAGNOSIS — Z23 Encounter for immunization: Secondary | ICD-10-CM | POA: Diagnosis not present

## 2015-01-12 DIAGNOSIS — N2581 Secondary hyperparathyroidism of renal origin: Secondary | ICD-10-CM | POA: Diagnosis not present

## 2015-01-12 DIAGNOSIS — D509 Iron deficiency anemia, unspecified: Secondary | ICD-10-CM | POA: Diagnosis not present

## 2015-01-14 DIAGNOSIS — Z23 Encounter for immunization: Secondary | ICD-10-CM | POA: Diagnosis not present

## 2015-01-14 DIAGNOSIS — N186 End stage renal disease: Secondary | ICD-10-CM | POA: Diagnosis not present

## 2015-01-14 DIAGNOSIS — N2581 Secondary hyperparathyroidism of renal origin: Secondary | ICD-10-CM | POA: Diagnosis not present

## 2015-01-14 DIAGNOSIS — D509 Iron deficiency anemia, unspecified: Secondary | ICD-10-CM | POA: Diagnosis not present

## 2015-01-17 DIAGNOSIS — N186 End stage renal disease: Secondary | ICD-10-CM | POA: Diagnosis not present

## 2015-01-17 DIAGNOSIS — N2581 Secondary hyperparathyroidism of renal origin: Secondary | ICD-10-CM | POA: Diagnosis not present

## 2015-01-17 DIAGNOSIS — Z23 Encounter for immunization: Secondary | ICD-10-CM | POA: Diagnosis not present

## 2015-01-17 DIAGNOSIS — D509 Iron deficiency anemia, unspecified: Secondary | ICD-10-CM | POA: Diagnosis not present

## 2015-01-18 ENCOUNTER — Encounter
Admission: RE | Disposition: A | Discharge status: Home / Self Care | Payer: Self-pay | Source: Ambulatory Visit | Attending: Vascular Surgery

## 2015-01-18 ENCOUNTER — Ambulatory Visit
Admission: RE | Admit: 2015-01-18 | Discharge: 2015-01-18 | Disposition: A | Discharge status: Home / Self Care | Payer: Medicare Other | Source: Ambulatory Visit | Attending: Vascular Surgery | Admitting: Vascular Surgery

## 2015-01-18 DIAGNOSIS — Z992 Dependence on renal dialysis: Secondary | ICD-10-CM | POA: Diagnosis not present

## 2015-01-18 DIAGNOSIS — Y832 Surgical operation with anastomosis, bypass or graft as the cause of abnormal reaction of the patient, or of later complication, without mention of misadventure at the time of the procedure: Secondary | ICD-10-CM | POA: Diagnosis not present

## 2015-01-18 DIAGNOSIS — Z79899 Other long term (current) drug therapy: Secondary | ICD-10-CM | POA: Diagnosis not present

## 2015-01-18 DIAGNOSIS — I120 Hypertensive chronic kidney disease with stage 5 chronic kidney disease or end stage renal disease: Secondary | ICD-10-CM | POA: Insufficient documentation

## 2015-01-18 DIAGNOSIS — N186 End stage renal disease: Secondary | ICD-10-CM | POA: Diagnosis not present

## 2015-01-18 DIAGNOSIS — K219 Gastro-esophageal reflux disease without esophagitis: Secondary | ICD-10-CM | POA: Insufficient documentation

## 2015-01-18 DIAGNOSIS — T82858A Stenosis of vascular prosthetic devices, implants and grafts, initial encounter: Secondary | ICD-10-CM | POA: Insufficient documentation

## 2015-01-18 DIAGNOSIS — Z7982 Long term (current) use of aspirin: Secondary | ICD-10-CM | POA: Insufficient documentation

## 2015-01-18 HISTORY — PX: PERIPHERAL VASCULAR CATHETERIZATION: SHX172C

## 2015-01-18 LAB — POTASSIUM: Potassium: 4.9 mmol/L (ref 3.5–5.1)

## 2015-01-18 SURGERY — A/V SHUNTOGRAM/FISTULAGRAM
Anesthesia: Moderate Sedation | Laterality: Left

## 2015-01-18 MED ORDER — INSULIN ASPART 100 UNIT/ML ~~LOC~~ SOLN: 0.0000 [IU] | SUBCUTANEOUS | Status: DC

## 2015-01-18 MED ORDER — CLINDAMYCIN PHOSPHATE 300 MG/50ML IV SOLN
INTRAVENOUS | Status: AC
  Filled 2015-01-18: qty 50

## 2015-01-18 MED ORDER — CEFAZOLIN SODIUM 1-5 GM-% IV SOLN
INTRAVENOUS | Status: AC
  Filled 2015-01-18: qty 50

## 2015-01-18 MED ORDER — HEPARIN SODIUM (PORCINE) 1000 UNIT/ML IJ SOLN
INTRAMUSCULAR | Status: DC | PRN
  Administered 2015-01-18: 3000 [IU] via INTRAVENOUS

## 2015-01-18 MED ORDER — FAMOTIDINE 20 MG PO TABS
20.0000 mg | ORAL_TABLET | Freq: Once | ORAL | Status: DC
Start: 2015-01-18 — End: 2015-01-18

## 2015-01-18 MED ORDER — METHYLPREDNISOLONE SODIUM SUCC 125 MG IJ SOLR
INTRAMUSCULAR | Status: AC
  Administered 2015-01-18: 125 mg
  Filled 2015-01-18: qty 2

## 2015-01-18 MED ORDER — FAMOTIDINE 20 MG PO TABS
ORAL_TABLET | ORAL | Status: AC
  Administered 2015-01-18: 20 mg
  Filled 2015-01-18: qty 1

## 2015-01-18 MED ORDER — CHLORHEXIDINE GLUCONATE CLOTH 2 % EX PADS: 6.0000 | MEDICATED_PAD | Freq: Once | CUTANEOUS | Status: DC

## 2015-01-18 MED ORDER — HEPARIN SODIUM (PORCINE) 1000 UNIT/ML IJ SOLN
INTRAMUSCULAR | Status: AC
  Filled 2015-01-18: qty 1

## 2015-01-18 MED ORDER — LIDOCAINE HCL (PF) 1 % IJ SOLN
INTRAMUSCULAR | Status: DC | PRN
  Administered 2015-01-18: 5 mL via INTRADERMAL

## 2015-01-18 MED ORDER — HEPARIN (PORCINE) IN NACL 2-0.9 UNIT/ML-% IJ SOLN
INTRAMUSCULAR | Status: AC
  Filled 2015-01-18: qty 1000

## 2015-01-18 MED ORDER — FENTANYL CITRATE (PF) 100 MCG/2ML IJ SOLN
INTRAMUSCULAR | Status: DC | PRN
Start: 2015-01-18 — End: 2015-01-18
  Administered 2015-01-18 (×3): 50 ug via INTRAVENOUS

## 2015-01-18 MED ORDER — CLINDAMYCIN PHOSPHATE 300 MG/50ML IV SOLN
300.0000 mg | Freq: Once | INTRAVENOUS | Status: AC
  Administered 2015-01-18: 300 mg via INTRAVENOUS

## 2015-01-18 MED ORDER — SODIUM CHLORIDE 0.9 % IJ SOLN
INTRAMUSCULAR | Status: AC
  Filled 2015-01-18: qty 15

## 2015-01-18 MED ORDER — LIDOCAINE HCL (PF) 1 % IJ SOLN
INTRAMUSCULAR | Status: AC
  Filled 2015-01-18: qty 10

## 2015-01-18 MED ORDER — MIDAZOLAM HCL 2 MG/2ML IJ SOLN
INTRAMUSCULAR | Status: DC | PRN
  Administered 2015-01-18: 2 mg via INTRAVENOUS
  Administered 2015-01-18 (×2): 1 mg via INTRAVENOUS

## 2015-01-18 MED ORDER — METHYLPREDNISOLONE SODIUM SUCC 125 MG IJ SOLR: 125.0000 mg | Freq: Once | INTRAMUSCULAR | Status: DC

## 2015-01-18 MED ORDER — IOHEXOL 300 MG/ML  SOLN
INTRAMUSCULAR | Status: DC | PRN
Start: 2015-01-18 — End: 2015-01-18
  Administered 2015-01-18: 20 mL via INTRA_ARTERIAL

## 2015-01-18 MED ORDER — FENTANYL CITRATE (PF) 100 MCG/2ML IJ SOLN
INTRAMUSCULAR | Status: AC
  Filled 2015-01-18: qty 2

## 2015-01-18 MED ORDER — SODIUM CHLORIDE 0.9 % IV SOLN
INTRAVENOUS | Status: DC
Start: 2015-01-18 — End: 2015-01-18
  Administered 2015-01-18 (×2): via INTRAVENOUS

## 2015-01-18 MED ORDER — MIDAZOLAM HCL 5 MG/5ML IJ SOLN
INTRAMUSCULAR | Status: AC
Start: 2015-01-18 — End: 2015-01-18
  Filled 2015-01-18: qty 5

## 2015-01-18 SURGICAL SUPPLY — 17 items
BAG DECANTER STRL (MISCELLANEOUS) ×3 IMPLANT
BALLN DORADO 8X40X80 (BALLOONS) ×3
BALLN ULTRV 018 8X40X75 (BALLOONS) ×3 IMPLANT
BALLOON DORADO 8X40X80 (BALLOONS) ×1 IMPLANT
CATH 5F KA2 (CATHETERS) ×3 IMPLANT
DEVICE PRESTO INFLATION (MISCELLANEOUS) ×3 IMPLANT
DRAPE BRACHIAL (DRAPES) ×3 IMPLANT
PACK ANGIOGRAPHY (CUSTOM PROCEDURE TRAY) ×3 IMPLANT
SET INTRO CAPELLA COAXIAL (SET/KITS/TRAYS/PACK) ×3 IMPLANT
SHEATH BRITE TIP 6FRX5.5 (SHEATH) ×3 IMPLANT
SHEATH BRITE TIP 7FRX5.5 (SHEATH) ×3 IMPLANT
STENT VIABAHN 8X2.5X120 (Permanent Stent) ×1 IMPLANT
STENT VIABAHN 8X25X120 (Permanent Stent) ×2 IMPLANT
SUT MNCRL AB 4-0 PS2 18 (SUTURE) ×3 IMPLANT
TOWEL OR 17X26 4PK STRL BLUE (TOWEL DISPOSABLE) ×3 IMPLANT
WIRE G V18X300CM (WIRE) ×3 IMPLANT
WIRE MAGIC TORQUE 260C (WIRE) ×3 IMPLANT

## 2015-01-18 NOTE — Discharge Instructions (Signed)
Fistulogram, Care After °Refer to this sheet in the next few weeks. These instructions provide you with information on caring for yourself after your procedure. Your health care provider may also give you more specific instructions. Your treatment has been planned according to current medical practices, but problems sometimes occur. Call your health care provider if you have any problems or questions after your procedure. °WHAT TO EXPECT AFTER THE PROCEDURE °After your procedure, it is typical to have the following: °· A small amount of discomfort in the area where the catheters were placed. °· A small amount of bruising around the fistula. °· Sleepiness and fatigue. °HOME CARE INSTRUCTIONS °· Rest at home for the day following your procedure. °· Do not drive or operate heavy machinery while taking pain medicine. °· Take medicines only as directed by your health care provider. °· Do not take baths, swim, or use a hot tub until your health care provider approves. You may shower 24 hours after the procedure or as directed by your health care provider. °· There are many different ways to close and cover an incision, including stitches, skin glue, and adhesive strips. Follow your health care provider's instructions on: °¨ Incision care. °¨ Bandage (dressing) changes and removal. °¨ Incision closure removal. °· Monitor your dialysis fistula carefully. °SEEK MEDICAL CARE IF: °· You have drainage, redness, swelling, or pain at your catheter site. °· You have a fever. °· You have chills. °SEEK IMMEDIATE MEDICAL CARE IF: °· You feel weak. °· You have trouble balancing. °· You have trouble moving your arms or legs. °· You have problems with your speech or vision. °· You can no longer feel a vibration or buzz when you put your fingers over your dialysis fistula. °· The limb that was used for the procedure: °¨ Swells. °¨ Is painful. °¨ Is cold. °¨ Is discolored, such as blue or pale white. °Document Released: 09/06/2013  Document Reviewed: 06/11/2013 °ExitCare® Patient Information ©2015 [employer]. This information is not intended to replace advice given to you by your health care provider. Make sure you discuss any questions you have with your health care provider. ° °

## 2015-01-18 NOTE — Op Note (Signed)
OPERATIVE NOTE   PROCEDURE: 1. Contrast injection left arm hero graft 2. Percutaneous transluminal and plasty to 8 mm the grommet of the left arm hero graft. 3. Percutaneous transluminal and plasty and stent placement arterial portion of left arm hero graft  PRE-OPERATIVE DIAGNOSIS: Complication of dialysis access                                                       End Stage Renal Disease  POST-OPERATIVE DIAGNOSIS: same as above   SURGEON: Katha Cabal, M.D.  ANESTHESIA: Conscious Sedation   ESTIMATED BLOOD LOSS: minimal  FINDING(S): 1. String sign noted just before the previously placed stent in the arterial portion of the graft. 65-70% stenosis noted at the grommet separate and distinct lesion.  SPECIMEN(S):  None  CONTRAST: 20 cc  FLUOROSCOPY TIME: 2.8 minutes  INDICATIONS: Kelli Nguyen is a 69 y.o. female who  presents with malfunctioning left arm hero AV access.  The patient is scheduled for angiography with possible intervention of the AV access.  The patient is aware the risks include but are not limited to: bleeding, infection, thrombosis of the cannulated access, and possible anaphylactic reaction to the contrast.  The patient acknowledges if the access can not be salvaged a tunneled catheter will be needed and will be placed during this procedure.  The patient is aware of the risks of the procedure and elects to proceed with the angiogram and intervention.  DESCRIPTION: After full informed written consent was obtained, the patient was brought back to the Special Procedure suite and placed supine position.  Appropriate cardiopulmonary monitors were placed.  The left arm was prepped and draped in the standard fashion.  Appropriate timeout is called. Ultrasound was placed in a sterile sleeve. The ultrasound is utilized to image the graft which is echolucent indicating patency. Images recorded for the permanent record. The left arm hero graft  was cannulated with a  micropuncture needle under direct visualization with the ultrasound.  The microwire was advanced and the needle was exchanged for  a microsheath.  The J-wire was then advanced and a 6 Fr sheath inserted.  Hand injections were completed to image the access from the arterial anastomosis through the entire access.  The central venous structures were also imaged by hand injections.  Based on the images, images show a string sign in the arterial portion just before the leading edge of a previously placed stent. The previously placed stent is widely patent throughout with no evidence of restenosis. At the level of the grommet there is a 65-70% stenosis. The intravascular portion of the hero grafts widely patent.  3000 units of heparin was given and a Magic torque wires inserted and an 8 x 4 [place] balloon is advanced across the first lesion. Inflation is to 32 atm for 1 full minute. The balloon is then taken down and repositioned at the grommet level this is a separate and distinct lesion this inflation is for 1 minute but only 14 atm is required. Follow-up imaging now demonstrates moderate improvement at the first site and resolution of the lesion at the second site by the grommet. Therefore the sheath was upsized to a 7 [place] sheath the wires exchanged for an 018 wire and a 8 x 2.5 Viabahn stent is deployed across the initial lesion that is  the string sign lesion. It is postdilated with an 8 mm balloon and follow-up imaging now demonstrates complete resolution of this lesion as well.    A 4-0 Monocryl purse-string suture was sewn around the sheath.  The sheath was removed and light pressure was applied.  A sterile bandage was applied to the puncture site.  Initial images demonstrate the graft is patent however there is a string sign as described above the arterial portion and a separate and distinct lesion by the grommet. Intravascular portion the catheters widely patent. Following angioplasty the lesion by the  grommet is completely resolved. Following and plasty of the string sign there is significant residual stenosis and a Viabahn stent is placed with complete resolution.    COMPLICATIONS: None  CONDITION: Carlynn Purl, M.D [facility] Office: [phone]  01/18/2015 10:26 AM

## 2015-01-18 NOTE — H&P (Signed)
[facility] History & Physical Update  The patient was interviewed and re-examined.  The patient's previous History and Physical has been reviewed and is unchanged.  There is no change in the plan of care. We plan to proceed with the scheduled procedure.  Nyra Anspaugh, Dolores Lory, MD  01/18/2015, 10:25 AM

## 2015-01-19 DIAGNOSIS — Z23 Encounter for immunization: Secondary | ICD-10-CM | POA: Diagnosis not present

## 2015-01-19 DIAGNOSIS — N2581 Secondary hyperparathyroidism of renal origin: Secondary | ICD-10-CM | POA: Diagnosis not present

## 2015-01-19 DIAGNOSIS — D509 Iron deficiency anemia, unspecified: Secondary | ICD-10-CM | POA: Diagnosis not present

## 2015-01-19 DIAGNOSIS — N186 End stage renal disease: Secondary | ICD-10-CM | POA: Diagnosis not present

## 2015-01-20 ENCOUNTER — Encounter: Payer: Self-pay | Admitting: Vascular Surgery

## 2015-01-21 DIAGNOSIS — N2581 Secondary hyperparathyroidism of renal origin: Secondary | ICD-10-CM | POA: Diagnosis not present

## 2015-01-21 DIAGNOSIS — Z23 Encounter for immunization: Secondary | ICD-10-CM | POA: Diagnosis not present

## 2015-01-21 DIAGNOSIS — D509 Iron deficiency anemia, unspecified: Secondary | ICD-10-CM | POA: Diagnosis not present

## 2015-01-21 DIAGNOSIS — N186 End stage renal disease: Secondary | ICD-10-CM | POA: Diagnosis not present

## 2015-01-24 DIAGNOSIS — N2581 Secondary hyperparathyroidism of renal origin: Secondary | ICD-10-CM | POA: Diagnosis not present

## 2015-01-24 DIAGNOSIS — Z23 Encounter for immunization: Secondary | ICD-10-CM | POA: Diagnosis not present

## 2015-01-24 DIAGNOSIS — D509 Iron deficiency anemia, unspecified: Secondary | ICD-10-CM | POA: Diagnosis not present

## 2015-01-24 DIAGNOSIS — N186 End stage renal disease: Secondary | ICD-10-CM | POA: Diagnosis not present

## 2015-01-26 DIAGNOSIS — N186 End stage renal disease: Secondary | ICD-10-CM | POA: Diagnosis not present

## 2015-01-26 DIAGNOSIS — N2581 Secondary hyperparathyroidism of renal origin: Secondary | ICD-10-CM | POA: Diagnosis not present

## 2015-01-26 DIAGNOSIS — Z23 Encounter for immunization: Secondary | ICD-10-CM | POA: Diagnosis not present

## 2015-01-26 DIAGNOSIS — D509 Iron deficiency anemia, unspecified: Secondary | ICD-10-CM | POA: Diagnosis not present

## 2015-01-28 DIAGNOSIS — D509 Iron deficiency anemia, unspecified: Secondary | ICD-10-CM | POA: Diagnosis not present

## 2015-01-28 DIAGNOSIS — N186 End stage renal disease: Secondary | ICD-10-CM | POA: Diagnosis not present

## 2015-01-28 DIAGNOSIS — N2581 Secondary hyperparathyroidism of renal origin: Secondary | ICD-10-CM | POA: Diagnosis not present

## 2015-01-28 DIAGNOSIS — Z23 Encounter for immunization: Secondary | ICD-10-CM | POA: Diagnosis not present

## 2015-01-31 DIAGNOSIS — Z23 Encounter for immunization: Secondary | ICD-10-CM | POA: Diagnosis not present

## 2015-01-31 DIAGNOSIS — N2581 Secondary hyperparathyroidism of renal origin: Secondary | ICD-10-CM | POA: Diagnosis not present

## 2015-01-31 DIAGNOSIS — D509 Iron deficiency anemia, unspecified: Secondary | ICD-10-CM | POA: Diagnosis not present

## 2015-01-31 DIAGNOSIS — N186 End stage renal disease: Secondary | ICD-10-CM | POA: Diagnosis not present

## 2015-02-02 DIAGNOSIS — N186 End stage renal disease: Secondary | ICD-10-CM | POA: Diagnosis not present

## 2015-02-02 DIAGNOSIS — D509 Iron deficiency anemia, unspecified: Secondary | ICD-10-CM | POA: Diagnosis not present

## 2015-02-02 DIAGNOSIS — Z23 Encounter for immunization: Secondary | ICD-10-CM | POA: Diagnosis not present

## 2015-02-02 DIAGNOSIS — N2581 Secondary hyperparathyroidism of renal origin: Secondary | ICD-10-CM | POA: Diagnosis not present

## 2015-02-03 DIAGNOSIS — N186 End stage renal disease: Secondary | ICD-10-CM | POA: Diagnosis not present

## 2015-02-03 DIAGNOSIS — Z992 Dependence on renal dialysis: Secondary | ICD-10-CM | POA: Diagnosis not present

## 2015-02-03 DIAGNOSIS — E1122 Type 2 diabetes mellitus with diabetic chronic kidney disease: Secondary | ICD-10-CM | POA: Diagnosis not present

## 2015-02-04 DIAGNOSIS — N186 End stage renal disease: Secondary | ICD-10-CM | POA: Diagnosis not present

## 2015-02-04 DIAGNOSIS — N2581 Secondary hyperparathyroidism of renal origin: Secondary | ICD-10-CM | POA: Diagnosis not present

## 2015-02-04 DIAGNOSIS — D631 Anemia in chronic kidney disease: Secondary | ICD-10-CM | POA: Diagnosis not present

## 2015-02-07 DIAGNOSIS — N2581 Secondary hyperparathyroidism of renal origin: Secondary | ICD-10-CM | POA: Diagnosis not present

## 2015-02-07 DIAGNOSIS — D631 Anemia in chronic kidney disease: Secondary | ICD-10-CM | POA: Diagnosis not present

## 2015-02-07 DIAGNOSIS — N186 End stage renal disease: Secondary | ICD-10-CM | POA: Diagnosis not present

## 2015-02-08 DIAGNOSIS — T82868A Thrombosis of vascular prosthetic devices, implants and grafts, initial encounter: Secondary | ICD-10-CM | POA: Diagnosis not present

## 2015-02-08 DIAGNOSIS — N186 End stage renal disease: Secondary | ICD-10-CM | POA: Diagnosis not present

## 2015-02-08 DIAGNOSIS — D631 Anemia in chronic kidney disease: Secondary | ICD-10-CM | POA: Diagnosis not present

## 2015-02-08 DIAGNOSIS — I871 Compression of vein: Secondary | ICD-10-CM | POA: Diagnosis not present

## 2015-02-08 DIAGNOSIS — I771 Stricture of artery: Secondary | ICD-10-CM | POA: Diagnosis not present

## 2015-02-08 DIAGNOSIS — N2581 Secondary hyperparathyroidism of renal origin: Secondary | ICD-10-CM | POA: Diagnosis not present

## 2015-02-09 DIAGNOSIS — N186 End stage renal disease: Secondary | ICD-10-CM | POA: Diagnosis not present

## 2015-02-09 DIAGNOSIS — N2581 Secondary hyperparathyroidism of renal origin: Secondary | ICD-10-CM | POA: Diagnosis not present

## 2015-02-09 DIAGNOSIS — D631 Anemia in chronic kidney disease: Secondary | ICD-10-CM | POA: Diagnosis not present

## 2015-02-11 DIAGNOSIS — N2581 Secondary hyperparathyroidism of renal origin: Secondary | ICD-10-CM | POA: Diagnosis not present

## 2015-02-11 DIAGNOSIS — N186 End stage renal disease: Secondary | ICD-10-CM | POA: Diagnosis not present

## 2015-02-11 DIAGNOSIS — D631 Anemia in chronic kidney disease: Secondary | ICD-10-CM | POA: Diagnosis not present

## 2015-02-13 DIAGNOSIS — E119 Type 2 diabetes mellitus without complications: Secondary | ICD-10-CM | POA: Diagnosis not present

## 2015-02-13 DIAGNOSIS — T82318A Breakdown (mechanical) of other vascular grafts, initial encounter: Secondary | ICD-10-CM | POA: Diagnosis not present

## 2015-02-13 DIAGNOSIS — Y841 Kidney dialysis as the cause of abnormal reaction of the patient, or of later complication, without mention of misadventure at the time of the procedure: Secondary | ICD-10-CM | POA: Diagnosis not present

## 2015-02-13 DIAGNOSIS — T82858A Stenosis of vascular prosthetic devices, implants and grafts, initial encounter: Secondary | ICD-10-CM | POA: Diagnosis not present

## 2015-02-13 DIAGNOSIS — I10 Essential (primary) hypertension: Secondary | ICD-10-CM | POA: Diagnosis not present

## 2015-02-13 DIAGNOSIS — N186 End stage renal disease: Secondary | ICD-10-CM | POA: Diagnosis not present

## 2015-02-13 DIAGNOSIS — Z992 Dependence on renal dialysis: Secondary | ICD-10-CM | POA: Diagnosis not present

## 2015-02-13 DIAGNOSIS — E785 Hyperlipidemia, unspecified: Secondary | ICD-10-CM | POA: Diagnosis not present

## 2015-02-13 DIAGNOSIS — E669 Obesity, unspecified: Secondary | ICD-10-CM | POA: Diagnosis not present

## 2015-02-14 DIAGNOSIS — D631 Anemia in chronic kidney disease: Secondary | ICD-10-CM | POA: Diagnosis not present

## 2015-02-14 DIAGNOSIS — N2581 Secondary hyperparathyroidism of renal origin: Secondary | ICD-10-CM | POA: Diagnosis not present

## 2015-02-14 DIAGNOSIS — N186 End stage renal disease: Secondary | ICD-10-CM | POA: Diagnosis not present

## 2015-02-16 DIAGNOSIS — N2581 Secondary hyperparathyroidism of renal origin: Secondary | ICD-10-CM | POA: Diagnosis not present

## 2015-02-16 DIAGNOSIS — D631 Anemia in chronic kidney disease: Secondary | ICD-10-CM | POA: Diagnosis not present

## 2015-02-16 DIAGNOSIS — N186 End stage renal disease: Secondary | ICD-10-CM | POA: Diagnosis not present

## 2015-02-18 DIAGNOSIS — D631 Anemia in chronic kidney disease: Secondary | ICD-10-CM | POA: Diagnosis not present

## 2015-02-18 DIAGNOSIS — N186 End stage renal disease: Secondary | ICD-10-CM | POA: Diagnosis not present

## 2015-02-18 DIAGNOSIS — N2581 Secondary hyperparathyroidism of renal origin: Secondary | ICD-10-CM | POA: Diagnosis not present

## 2015-02-21 DIAGNOSIS — N2581 Secondary hyperparathyroidism of renal origin: Secondary | ICD-10-CM | POA: Diagnosis not present

## 2015-02-21 DIAGNOSIS — D631 Anemia in chronic kidney disease: Secondary | ICD-10-CM | POA: Diagnosis not present

## 2015-02-21 DIAGNOSIS — N186 End stage renal disease: Secondary | ICD-10-CM | POA: Diagnosis not present

## 2015-02-22 DIAGNOSIS — R29898 Other symptoms and signs involving the musculoskeletal system: Secondary | ICD-10-CM | POA: Insufficient documentation

## 2015-02-22 DIAGNOSIS — M79641 Pain in right hand: Secondary | ICD-10-CM | POA: Insufficient documentation

## 2015-02-22 DIAGNOSIS — R200 Anesthesia of skin: Secondary | ICD-10-CM | POA: Insufficient documentation

## 2015-02-22 DIAGNOSIS — M6281 Muscle weakness (generalized): Secondary | ICD-10-CM | POA: Diagnosis not present

## 2015-02-22 DIAGNOSIS — M79642 Pain in left hand: Secondary | ICD-10-CM | POA: Diagnosis not present

## 2015-02-23 DIAGNOSIS — N2581 Secondary hyperparathyroidism of renal origin: Secondary | ICD-10-CM | POA: Diagnosis not present

## 2015-02-23 DIAGNOSIS — D631 Anemia in chronic kidney disease: Secondary | ICD-10-CM | POA: Diagnosis not present

## 2015-02-23 DIAGNOSIS — N186 End stage renal disease: Secondary | ICD-10-CM | POA: Diagnosis not present

## 2015-02-24 DIAGNOSIS — H40033 Anatomical narrow angle, bilateral: Secondary | ICD-10-CM | POA: Diagnosis not present

## 2015-02-25 DIAGNOSIS — N186 End stage renal disease: Secondary | ICD-10-CM | POA: Diagnosis not present

## 2015-02-25 DIAGNOSIS — D631 Anemia in chronic kidney disease: Secondary | ICD-10-CM | POA: Diagnosis not present

## 2015-02-25 DIAGNOSIS — N2581 Secondary hyperparathyroidism of renal origin: Secondary | ICD-10-CM | POA: Diagnosis not present

## 2015-02-28 DIAGNOSIS — N2581 Secondary hyperparathyroidism of renal origin: Secondary | ICD-10-CM | POA: Diagnosis not present

## 2015-02-28 DIAGNOSIS — N186 End stage renal disease: Secondary | ICD-10-CM | POA: Diagnosis not present

## 2015-02-28 DIAGNOSIS — D631 Anemia in chronic kidney disease: Secondary | ICD-10-CM | POA: Diagnosis not present

## 2015-03-02 DIAGNOSIS — N2581 Secondary hyperparathyroidism of renal origin: Secondary | ICD-10-CM | POA: Diagnosis not present

## 2015-03-02 DIAGNOSIS — N186 End stage renal disease: Secondary | ICD-10-CM | POA: Diagnosis not present

## 2015-03-02 DIAGNOSIS — D631 Anemia in chronic kidney disease: Secondary | ICD-10-CM | POA: Diagnosis not present

## 2015-03-04 DIAGNOSIS — N2581 Secondary hyperparathyroidism of renal origin: Secondary | ICD-10-CM | POA: Diagnosis not present

## 2015-03-04 DIAGNOSIS — N186 End stage renal disease: Secondary | ICD-10-CM | POA: Diagnosis not present

## 2015-03-04 DIAGNOSIS — D631 Anemia in chronic kidney disease: Secondary | ICD-10-CM | POA: Diagnosis not present

## 2015-03-07 DIAGNOSIS — N2581 Secondary hyperparathyroidism of renal origin: Secondary | ICD-10-CM | POA: Diagnosis not present

## 2015-03-07 DIAGNOSIS — N186 End stage renal disease: Secondary | ICD-10-CM | POA: Diagnosis not present

## 2015-03-09 DIAGNOSIS — N186 End stage renal disease: Secondary | ICD-10-CM | POA: Diagnosis not present

## 2015-03-09 DIAGNOSIS — N2581 Secondary hyperparathyroidism of renal origin: Secondary | ICD-10-CM | POA: Diagnosis not present

## 2015-03-11 DIAGNOSIS — N186 End stage renal disease: Secondary | ICD-10-CM | POA: Diagnosis not present

## 2015-03-11 DIAGNOSIS — N2581 Secondary hyperparathyroidism of renal origin: Secondary | ICD-10-CM | POA: Diagnosis not present

## 2015-03-14 DIAGNOSIS — N2581 Secondary hyperparathyroidism of renal origin: Secondary | ICD-10-CM | POA: Diagnosis not present

## 2015-03-14 DIAGNOSIS — N186 End stage renal disease: Secondary | ICD-10-CM | POA: Diagnosis not present

## 2015-03-16 DIAGNOSIS — N2581 Secondary hyperparathyroidism of renal origin: Secondary | ICD-10-CM | POA: Diagnosis not present

## 2015-03-16 DIAGNOSIS — N186 End stage renal disease: Secondary | ICD-10-CM | POA: Diagnosis not present

## 2015-03-18 DIAGNOSIS — N186 End stage renal disease: Secondary | ICD-10-CM | POA: Diagnosis not present

## 2015-03-18 DIAGNOSIS — N2581 Secondary hyperparathyroidism of renal origin: Secondary | ICD-10-CM | POA: Diagnosis not present

## 2015-03-21 DIAGNOSIS — N2581 Secondary hyperparathyroidism of renal origin: Secondary | ICD-10-CM | POA: Diagnosis not present

## 2015-03-21 DIAGNOSIS — N186 End stage renal disease: Secondary | ICD-10-CM | POA: Diagnosis not present

## 2015-03-23 DIAGNOSIS — N186 End stage renal disease: Secondary | ICD-10-CM | POA: Diagnosis not present

## 2015-03-23 DIAGNOSIS — N2581 Secondary hyperparathyroidism of renal origin: Secondary | ICD-10-CM | POA: Diagnosis not present

## 2015-03-25 DIAGNOSIS — N186 End stage renal disease: Secondary | ICD-10-CM | POA: Diagnosis not present

## 2015-03-25 DIAGNOSIS — N2581 Secondary hyperparathyroidism of renal origin: Secondary | ICD-10-CM | POA: Diagnosis not present

## 2015-03-28 DIAGNOSIS — N186 End stage renal disease: Secondary | ICD-10-CM | POA: Diagnosis not present

## 2015-03-28 DIAGNOSIS — N2581 Secondary hyperparathyroidism of renal origin: Secondary | ICD-10-CM | POA: Diagnosis not present

## 2015-03-30 DIAGNOSIS — N186 End stage renal disease: Secondary | ICD-10-CM | POA: Diagnosis not present

## 2015-03-30 DIAGNOSIS — N2581 Secondary hyperparathyroidism of renal origin: Secondary | ICD-10-CM | POA: Diagnosis not present

## 2015-04-01 DIAGNOSIS — N2581 Secondary hyperparathyroidism of renal origin: Secondary | ICD-10-CM | POA: Diagnosis not present

## 2015-04-01 DIAGNOSIS — N186 End stage renal disease: Secondary | ICD-10-CM | POA: Diagnosis not present

## 2015-04-03 DIAGNOSIS — T82858A Stenosis of vascular prosthetic devices, implants and grafts, initial encounter: Secondary | ICD-10-CM | POA: Diagnosis not present

## 2015-04-03 DIAGNOSIS — E785 Hyperlipidemia, unspecified: Secondary | ICD-10-CM | POA: Diagnosis not present

## 2015-04-03 DIAGNOSIS — M79609 Pain in unspecified limb: Secondary | ICD-10-CM | POA: Diagnosis not present

## 2015-04-03 DIAGNOSIS — Z992 Dependence on renal dialysis: Secondary | ICD-10-CM | POA: Diagnosis not present

## 2015-04-03 DIAGNOSIS — T82318A Breakdown (mechanical) of other vascular grafts, initial encounter: Secondary | ICD-10-CM | POA: Diagnosis not present

## 2015-04-03 DIAGNOSIS — N186 End stage renal disease: Secondary | ICD-10-CM | POA: Diagnosis not present

## 2015-04-03 DIAGNOSIS — E669 Obesity, unspecified: Secondary | ICD-10-CM | POA: Diagnosis not present

## 2015-04-03 DIAGNOSIS — I10 Essential (primary) hypertension: Secondary | ICD-10-CM | POA: Diagnosis not present

## 2015-04-03 DIAGNOSIS — E119 Type 2 diabetes mellitus without complications: Secondary | ICD-10-CM | POA: Diagnosis not present

## 2015-04-03 DIAGNOSIS — Y841 Kidney dialysis as the cause of abnormal reaction of the patient, or of later complication, without mention of misadventure at the time of the procedure: Secondary | ICD-10-CM | POA: Diagnosis not present

## 2015-04-04 DIAGNOSIS — N186 End stage renal disease: Secondary | ICD-10-CM | POA: Diagnosis not present

## 2015-04-04 DIAGNOSIS — N2581 Secondary hyperparathyroidism of renal origin: Secondary | ICD-10-CM | POA: Diagnosis not present

## 2015-04-05 DIAGNOSIS — E1122 Type 2 diabetes mellitus with diabetic chronic kidney disease: Secondary | ICD-10-CM | POA: Diagnosis not present

## 2015-04-05 DIAGNOSIS — Z992 Dependence on renal dialysis: Secondary | ICD-10-CM | POA: Diagnosis not present

## 2015-04-05 DIAGNOSIS — N186 End stage renal disease: Secondary | ICD-10-CM | POA: Diagnosis not present

## 2015-04-06 DIAGNOSIS — Z23 Encounter for immunization: Secondary | ICD-10-CM | POA: Diagnosis not present

## 2015-04-06 DIAGNOSIS — N186 End stage renal disease: Secondary | ICD-10-CM | POA: Diagnosis not present

## 2015-04-06 DIAGNOSIS — N2581 Secondary hyperparathyroidism of renal origin: Secondary | ICD-10-CM | POA: Diagnosis not present

## 2015-04-08 DIAGNOSIS — N2581 Secondary hyperparathyroidism of renal origin: Secondary | ICD-10-CM | POA: Diagnosis not present

## 2015-04-08 DIAGNOSIS — N186 End stage renal disease: Secondary | ICD-10-CM | POA: Diagnosis not present

## 2015-04-08 DIAGNOSIS — Z23 Encounter for immunization: Secondary | ICD-10-CM | POA: Diagnosis not present

## 2015-04-10 DIAGNOSIS — T82318A Breakdown (mechanical) of other vascular grafts, initial encounter: Secondary | ICD-10-CM | POA: Diagnosis not present

## 2015-04-10 DIAGNOSIS — M79609 Pain in unspecified limb: Secondary | ICD-10-CM | POA: Diagnosis not present

## 2015-04-10 DIAGNOSIS — T82858A Stenosis of vascular prosthetic devices, implants and grafts, initial encounter: Secondary | ICD-10-CM | POA: Diagnosis not present

## 2015-04-10 DIAGNOSIS — Y841 Kidney dialysis as the cause of abnormal reaction of the patient, or of later complication, without mention of misadventure at the time of the procedure: Secondary | ICD-10-CM | POA: Diagnosis not present

## 2015-04-10 DIAGNOSIS — E669 Obesity, unspecified: Secondary | ICD-10-CM | POA: Diagnosis not present

## 2015-04-10 DIAGNOSIS — E119 Type 2 diabetes mellitus without complications: Secondary | ICD-10-CM | POA: Diagnosis not present

## 2015-04-10 DIAGNOSIS — I10 Essential (primary) hypertension: Secondary | ICD-10-CM | POA: Diagnosis not present

## 2015-04-10 DIAGNOSIS — E785 Hyperlipidemia, unspecified: Secondary | ICD-10-CM | POA: Diagnosis not present

## 2015-04-10 DIAGNOSIS — N186 End stage renal disease: Secondary | ICD-10-CM | POA: Diagnosis not present

## 2015-04-10 DIAGNOSIS — Z992 Dependence on renal dialysis: Secondary | ICD-10-CM | POA: Diagnosis not present

## 2015-04-11 DIAGNOSIS — N186 End stage renal disease: Secondary | ICD-10-CM | POA: Diagnosis not present

## 2015-04-11 DIAGNOSIS — N2581 Secondary hyperparathyroidism of renal origin: Secondary | ICD-10-CM | POA: Diagnosis not present

## 2015-04-11 DIAGNOSIS — Z23 Encounter for immunization: Secondary | ICD-10-CM | POA: Diagnosis not present

## 2015-04-13 DIAGNOSIS — N186 End stage renal disease: Secondary | ICD-10-CM | POA: Diagnosis not present

## 2015-04-13 DIAGNOSIS — Z23 Encounter for immunization: Secondary | ICD-10-CM | POA: Diagnosis not present

## 2015-04-13 DIAGNOSIS — N2581 Secondary hyperparathyroidism of renal origin: Secondary | ICD-10-CM | POA: Diagnosis not present

## 2015-04-15 DIAGNOSIS — N2581 Secondary hyperparathyroidism of renal origin: Secondary | ICD-10-CM | POA: Diagnosis not present

## 2015-04-15 DIAGNOSIS — Z23 Encounter for immunization: Secondary | ICD-10-CM | POA: Diagnosis not present

## 2015-04-15 DIAGNOSIS — N186 End stage renal disease: Secondary | ICD-10-CM | POA: Diagnosis not present

## 2015-04-17 DIAGNOSIS — M6281 Muscle weakness (generalized): Secondary | ICD-10-CM | POA: Diagnosis not present

## 2015-04-17 DIAGNOSIS — R200 Anesthesia of skin: Secondary | ICD-10-CM | POA: Diagnosis not present

## 2015-04-17 DIAGNOSIS — M79641 Pain in right hand: Secondary | ICD-10-CM | POA: Diagnosis not present

## 2015-04-17 DIAGNOSIS — M79642 Pain in left hand: Secondary | ICD-10-CM | POA: Diagnosis not present

## 2015-04-18 DIAGNOSIS — N2581 Secondary hyperparathyroidism of renal origin: Secondary | ICD-10-CM | POA: Diagnosis not present

## 2015-04-18 DIAGNOSIS — N186 End stage renal disease: Secondary | ICD-10-CM | POA: Diagnosis not present

## 2015-04-18 DIAGNOSIS — Z23 Encounter for immunization: Secondary | ICD-10-CM | POA: Diagnosis not present

## 2015-04-20 DIAGNOSIS — N186 End stage renal disease: Secondary | ICD-10-CM | POA: Diagnosis not present

## 2015-04-20 DIAGNOSIS — N2581 Secondary hyperparathyroidism of renal origin: Secondary | ICD-10-CM | POA: Diagnosis not present

## 2015-04-20 DIAGNOSIS — Z23 Encounter for immunization: Secondary | ICD-10-CM | POA: Diagnosis not present

## 2015-04-22 DIAGNOSIS — N2581 Secondary hyperparathyroidism of renal origin: Secondary | ICD-10-CM | POA: Diagnosis not present

## 2015-04-22 DIAGNOSIS — Z23 Encounter for immunization: Secondary | ICD-10-CM | POA: Diagnosis not present

## 2015-04-22 DIAGNOSIS — N186 End stage renal disease: Secondary | ICD-10-CM | POA: Diagnosis not present

## 2015-04-25 DIAGNOSIS — N2581 Secondary hyperparathyroidism of renal origin: Secondary | ICD-10-CM | POA: Diagnosis not present

## 2015-04-25 DIAGNOSIS — Z23 Encounter for immunization: Secondary | ICD-10-CM | POA: Diagnosis not present

## 2015-04-25 DIAGNOSIS — N186 End stage renal disease: Secondary | ICD-10-CM | POA: Diagnosis not present

## 2015-04-27 DIAGNOSIS — N186 End stage renal disease: Secondary | ICD-10-CM | POA: Diagnosis not present

## 2015-04-27 DIAGNOSIS — Z23 Encounter for immunization: Secondary | ICD-10-CM | POA: Diagnosis not present

## 2015-04-27 DIAGNOSIS — N2581 Secondary hyperparathyroidism of renal origin: Secondary | ICD-10-CM | POA: Diagnosis not present

## 2015-04-29 DIAGNOSIS — Z23 Encounter for immunization: Secondary | ICD-10-CM | POA: Diagnosis not present

## 2015-04-29 DIAGNOSIS — N186 End stage renal disease: Secondary | ICD-10-CM | POA: Diagnosis not present

## 2015-04-29 DIAGNOSIS — N2581 Secondary hyperparathyroidism of renal origin: Secondary | ICD-10-CM | POA: Diagnosis not present

## 2015-05-02 DIAGNOSIS — N2581 Secondary hyperparathyroidism of renal origin: Secondary | ICD-10-CM | POA: Diagnosis not present

## 2015-05-02 DIAGNOSIS — N186 End stage renal disease: Secondary | ICD-10-CM | POA: Diagnosis not present

## 2015-05-02 DIAGNOSIS — Z23 Encounter for immunization: Secondary | ICD-10-CM | POA: Diagnosis not present

## 2015-05-04 DIAGNOSIS — Z23 Encounter for immunization: Secondary | ICD-10-CM | POA: Diagnosis not present

## 2015-05-04 DIAGNOSIS — N2581 Secondary hyperparathyroidism of renal origin: Secondary | ICD-10-CM | POA: Diagnosis not present

## 2015-05-04 DIAGNOSIS — N186 End stage renal disease: Secondary | ICD-10-CM | POA: Diagnosis not present

## 2015-05-06 DIAGNOSIS — Z992 Dependence on renal dialysis: Secondary | ICD-10-CM | POA: Diagnosis not present

## 2015-05-06 DIAGNOSIS — N186 End stage renal disease: Secondary | ICD-10-CM | POA: Diagnosis not present

## 2015-05-06 DIAGNOSIS — N2581 Secondary hyperparathyroidism of renal origin: Secondary | ICD-10-CM | POA: Diagnosis not present

## 2015-05-06 DIAGNOSIS — E1122 Type 2 diabetes mellitus with diabetic chronic kidney disease: Secondary | ICD-10-CM | POA: Diagnosis not present

## 2015-05-06 DIAGNOSIS — Z23 Encounter for immunization: Secondary | ICD-10-CM | POA: Diagnosis not present

## 2015-05-09 DIAGNOSIS — N186 End stage renal disease: Secondary | ICD-10-CM | POA: Diagnosis not present

## 2015-05-09 DIAGNOSIS — D631 Anemia in chronic kidney disease: Secondary | ICD-10-CM | POA: Diagnosis not present

## 2015-05-09 DIAGNOSIS — N2581 Secondary hyperparathyroidism of renal origin: Secondary | ICD-10-CM | POA: Diagnosis not present

## 2015-05-11 DIAGNOSIS — N186 End stage renal disease: Secondary | ICD-10-CM | POA: Diagnosis not present

## 2015-05-11 DIAGNOSIS — N2581 Secondary hyperparathyroidism of renal origin: Secondary | ICD-10-CM | POA: Diagnosis not present

## 2015-05-11 DIAGNOSIS — D631 Anemia in chronic kidney disease: Secondary | ICD-10-CM | POA: Diagnosis not present

## 2015-05-12 DIAGNOSIS — H40033 Anatomical narrow angle, bilateral: Secondary | ICD-10-CM | POA: Diagnosis not present

## 2015-05-14 DIAGNOSIS — N2581 Secondary hyperparathyroidism of renal origin: Secondary | ICD-10-CM | POA: Diagnosis not present

## 2015-05-14 DIAGNOSIS — N186 End stage renal disease: Secondary | ICD-10-CM | POA: Diagnosis not present

## 2015-05-14 DIAGNOSIS — D631 Anemia in chronic kidney disease: Secondary | ICD-10-CM | POA: Diagnosis not present

## 2015-05-16 DIAGNOSIS — N186 End stage renal disease: Secondary | ICD-10-CM | POA: Diagnosis not present

## 2015-05-16 DIAGNOSIS — D631 Anemia in chronic kidney disease: Secondary | ICD-10-CM | POA: Diagnosis not present

## 2015-05-16 DIAGNOSIS — N2581 Secondary hyperparathyroidism of renal origin: Secondary | ICD-10-CM | POA: Diagnosis not present

## 2015-05-17 DIAGNOSIS — M79641 Pain in right hand: Secondary | ICD-10-CM | POA: Diagnosis not present

## 2015-05-17 DIAGNOSIS — M79642 Pain in left hand: Secondary | ICD-10-CM | POA: Diagnosis not present

## 2015-05-17 DIAGNOSIS — G5603 Carpal tunnel syndrome, bilateral upper limbs: Secondary | ICD-10-CM | POA: Diagnosis not present

## 2015-05-17 DIAGNOSIS — R200 Anesthesia of skin: Secondary | ICD-10-CM | POA: Diagnosis not present

## 2015-05-18 DIAGNOSIS — N2581 Secondary hyperparathyroidism of renal origin: Secondary | ICD-10-CM | POA: Diagnosis not present

## 2015-05-18 DIAGNOSIS — D631 Anemia in chronic kidney disease: Secondary | ICD-10-CM | POA: Diagnosis not present

## 2015-05-18 DIAGNOSIS — N186 End stage renal disease: Secondary | ICD-10-CM | POA: Diagnosis not present

## 2015-05-20 DIAGNOSIS — D631 Anemia in chronic kidney disease: Secondary | ICD-10-CM | POA: Diagnosis not present

## 2015-05-20 DIAGNOSIS — N186 End stage renal disease: Secondary | ICD-10-CM | POA: Diagnosis not present

## 2015-05-20 DIAGNOSIS — N2581 Secondary hyperparathyroidism of renal origin: Secondary | ICD-10-CM | POA: Diagnosis not present

## 2015-05-23 DIAGNOSIS — N2581 Secondary hyperparathyroidism of renal origin: Secondary | ICD-10-CM | POA: Diagnosis not present

## 2015-05-23 DIAGNOSIS — N186 End stage renal disease: Secondary | ICD-10-CM | POA: Diagnosis not present

## 2015-05-23 DIAGNOSIS — D631 Anemia in chronic kidney disease: Secondary | ICD-10-CM | POA: Diagnosis not present

## 2015-05-25 DIAGNOSIS — N186 End stage renal disease: Secondary | ICD-10-CM | POA: Diagnosis not present

## 2015-05-25 DIAGNOSIS — N2581 Secondary hyperparathyroidism of renal origin: Secondary | ICD-10-CM | POA: Diagnosis not present

## 2015-05-25 DIAGNOSIS — D631 Anemia in chronic kidney disease: Secondary | ICD-10-CM | POA: Diagnosis not present

## 2015-05-27 DIAGNOSIS — N2581 Secondary hyperparathyroidism of renal origin: Secondary | ICD-10-CM | POA: Diagnosis not present

## 2015-05-27 DIAGNOSIS — N186 End stage renal disease: Secondary | ICD-10-CM | POA: Diagnosis not present

## 2015-05-27 DIAGNOSIS — D631 Anemia in chronic kidney disease: Secondary | ICD-10-CM | POA: Diagnosis not present

## 2015-05-30 DIAGNOSIS — D631 Anemia in chronic kidney disease: Secondary | ICD-10-CM | POA: Diagnosis not present

## 2015-05-30 DIAGNOSIS — N186 End stage renal disease: Secondary | ICD-10-CM | POA: Diagnosis not present

## 2015-05-30 DIAGNOSIS — N2581 Secondary hyperparathyroidism of renal origin: Secondary | ICD-10-CM | POA: Diagnosis not present

## 2015-06-01 DIAGNOSIS — N186 End stage renal disease: Secondary | ICD-10-CM | POA: Diagnosis not present

## 2015-06-01 DIAGNOSIS — D631 Anemia in chronic kidney disease: Secondary | ICD-10-CM | POA: Diagnosis not present

## 2015-06-01 DIAGNOSIS — N2581 Secondary hyperparathyroidism of renal origin: Secondary | ICD-10-CM | POA: Diagnosis not present

## 2015-06-03 DIAGNOSIS — N2581 Secondary hyperparathyroidism of renal origin: Secondary | ICD-10-CM | POA: Diagnosis not present

## 2015-06-03 DIAGNOSIS — N186 End stage renal disease: Secondary | ICD-10-CM | POA: Diagnosis not present

## 2015-06-03 DIAGNOSIS — D631 Anemia in chronic kidney disease: Secondary | ICD-10-CM | POA: Diagnosis not present

## 2015-06-06 DIAGNOSIS — Z992 Dependence on renal dialysis: Secondary | ICD-10-CM | POA: Diagnosis not present

## 2015-06-06 DIAGNOSIS — E1122 Type 2 diabetes mellitus with diabetic chronic kidney disease: Secondary | ICD-10-CM | POA: Diagnosis not present

## 2015-06-06 DIAGNOSIS — N186 End stage renal disease: Secondary | ICD-10-CM | POA: Diagnosis not present

## 2015-06-06 DIAGNOSIS — D631 Anemia in chronic kidney disease: Secondary | ICD-10-CM | POA: Diagnosis not present

## 2015-06-06 DIAGNOSIS — N2581 Secondary hyperparathyroidism of renal origin: Secondary | ICD-10-CM | POA: Diagnosis not present

## 2015-06-07 DIAGNOSIS — M79642 Pain in left hand: Secondary | ICD-10-CM | POA: Diagnosis not present

## 2015-06-07 DIAGNOSIS — M6281 Muscle weakness (generalized): Secondary | ICD-10-CM | POA: Diagnosis not present

## 2015-06-07 DIAGNOSIS — R200 Anesthesia of skin: Secondary | ICD-10-CM | POA: Diagnosis not present

## 2015-06-07 DIAGNOSIS — M79641 Pain in right hand: Secondary | ICD-10-CM | POA: Diagnosis not present

## 2015-06-07 DIAGNOSIS — G5603 Carpal tunnel syndrome, bilateral upper limbs: Secondary | ICD-10-CM | POA: Diagnosis not present

## 2015-06-08 DIAGNOSIS — N186 End stage renal disease: Secondary | ICD-10-CM | POA: Diagnosis not present

## 2015-06-08 DIAGNOSIS — N2581 Secondary hyperparathyroidism of renal origin: Secondary | ICD-10-CM | POA: Diagnosis not present

## 2015-06-08 DIAGNOSIS — D631 Anemia in chronic kidney disease: Secondary | ICD-10-CM | POA: Diagnosis not present

## 2015-06-10 DIAGNOSIS — D631 Anemia in chronic kidney disease: Secondary | ICD-10-CM | POA: Diagnosis not present

## 2015-06-10 DIAGNOSIS — N2581 Secondary hyperparathyroidism of renal origin: Secondary | ICD-10-CM | POA: Diagnosis not present

## 2015-06-10 DIAGNOSIS — N186 End stage renal disease: Secondary | ICD-10-CM | POA: Diagnosis not present

## 2015-06-13 DIAGNOSIS — D631 Anemia in chronic kidney disease: Secondary | ICD-10-CM | POA: Diagnosis not present

## 2015-06-13 DIAGNOSIS — N2581 Secondary hyperparathyroidism of renal origin: Secondary | ICD-10-CM | POA: Diagnosis not present

## 2015-06-13 DIAGNOSIS — N186 End stage renal disease: Secondary | ICD-10-CM | POA: Diagnosis not present

## 2015-06-15 DIAGNOSIS — D631 Anemia in chronic kidney disease: Secondary | ICD-10-CM | POA: Diagnosis not present

## 2015-06-15 DIAGNOSIS — N2581 Secondary hyperparathyroidism of renal origin: Secondary | ICD-10-CM | POA: Diagnosis not present

## 2015-06-15 DIAGNOSIS — N186 End stage renal disease: Secondary | ICD-10-CM | POA: Diagnosis not present

## 2015-06-17 DIAGNOSIS — N2581 Secondary hyperparathyroidism of renal origin: Secondary | ICD-10-CM | POA: Diagnosis not present

## 2015-06-17 DIAGNOSIS — D631 Anemia in chronic kidney disease: Secondary | ICD-10-CM | POA: Diagnosis not present

## 2015-06-17 DIAGNOSIS — N186 End stage renal disease: Secondary | ICD-10-CM | POA: Diagnosis not present

## 2015-06-20 DIAGNOSIS — N2581 Secondary hyperparathyroidism of renal origin: Secondary | ICD-10-CM | POA: Diagnosis not present

## 2015-06-20 DIAGNOSIS — D631 Anemia in chronic kidney disease: Secondary | ICD-10-CM | POA: Diagnosis not present

## 2015-06-20 DIAGNOSIS — N186 End stage renal disease: Secondary | ICD-10-CM | POA: Diagnosis not present

## 2015-06-22 DIAGNOSIS — D631 Anemia in chronic kidney disease: Secondary | ICD-10-CM | POA: Diagnosis not present

## 2015-06-22 DIAGNOSIS — N2581 Secondary hyperparathyroidism of renal origin: Secondary | ICD-10-CM | POA: Diagnosis not present

## 2015-06-22 DIAGNOSIS — N186 End stage renal disease: Secondary | ICD-10-CM | POA: Diagnosis not present

## 2015-06-24 DIAGNOSIS — D631 Anemia in chronic kidney disease: Secondary | ICD-10-CM | POA: Diagnosis not present

## 2015-06-24 DIAGNOSIS — N186 End stage renal disease: Secondary | ICD-10-CM | POA: Diagnosis not present

## 2015-06-24 DIAGNOSIS — N2581 Secondary hyperparathyroidism of renal origin: Secondary | ICD-10-CM | POA: Diagnosis not present

## 2015-06-27 DIAGNOSIS — N2581 Secondary hyperparathyroidism of renal origin: Secondary | ICD-10-CM | POA: Diagnosis not present

## 2015-06-27 DIAGNOSIS — D631 Anemia in chronic kidney disease: Secondary | ICD-10-CM | POA: Diagnosis not present

## 2015-06-27 DIAGNOSIS — N186 End stage renal disease: Secondary | ICD-10-CM | POA: Diagnosis not present

## 2015-06-29 DIAGNOSIS — N2581 Secondary hyperparathyroidism of renal origin: Secondary | ICD-10-CM | POA: Diagnosis not present

## 2015-06-29 DIAGNOSIS — N186 End stage renal disease: Secondary | ICD-10-CM | POA: Diagnosis not present

## 2015-06-29 DIAGNOSIS — D631 Anemia in chronic kidney disease: Secondary | ICD-10-CM | POA: Diagnosis not present

## 2015-07-01 DIAGNOSIS — N2581 Secondary hyperparathyroidism of renal origin: Secondary | ICD-10-CM | POA: Diagnosis not present

## 2015-07-01 DIAGNOSIS — N186 End stage renal disease: Secondary | ICD-10-CM | POA: Diagnosis not present

## 2015-07-01 DIAGNOSIS — D631 Anemia in chronic kidney disease: Secondary | ICD-10-CM | POA: Diagnosis not present

## 2015-07-04 DIAGNOSIS — N2581 Secondary hyperparathyroidism of renal origin: Secondary | ICD-10-CM | POA: Diagnosis not present

## 2015-07-04 DIAGNOSIS — N186 End stage renal disease: Secondary | ICD-10-CM | POA: Diagnosis not present

## 2015-07-04 DIAGNOSIS — E1122 Type 2 diabetes mellitus with diabetic chronic kidney disease: Secondary | ICD-10-CM | POA: Diagnosis not present

## 2015-07-04 DIAGNOSIS — Z992 Dependence on renal dialysis: Secondary | ICD-10-CM | POA: Diagnosis not present

## 2015-07-04 DIAGNOSIS — D631 Anemia in chronic kidney disease: Secondary | ICD-10-CM | POA: Diagnosis not present

## 2015-07-06 DIAGNOSIS — N186 End stage renal disease: Secondary | ICD-10-CM | POA: Diagnosis not present

## 2015-07-06 DIAGNOSIS — E119 Type 2 diabetes mellitus without complications: Secondary | ICD-10-CM | POA: Diagnosis not present

## 2015-07-06 DIAGNOSIS — D631 Anemia in chronic kidney disease: Secondary | ICD-10-CM | POA: Diagnosis not present

## 2015-07-06 DIAGNOSIS — N2581 Secondary hyperparathyroidism of renal origin: Secondary | ICD-10-CM | POA: Diagnosis not present

## 2015-07-08 DIAGNOSIS — N186 End stage renal disease: Secondary | ICD-10-CM | POA: Diagnosis not present

## 2015-07-08 DIAGNOSIS — D631 Anemia in chronic kidney disease: Secondary | ICD-10-CM | POA: Diagnosis not present

## 2015-07-08 DIAGNOSIS — N2581 Secondary hyperparathyroidism of renal origin: Secondary | ICD-10-CM | POA: Diagnosis not present

## 2015-07-08 DIAGNOSIS — E119 Type 2 diabetes mellitus without complications: Secondary | ICD-10-CM | POA: Diagnosis not present

## 2015-07-11 DIAGNOSIS — N2581 Secondary hyperparathyroidism of renal origin: Secondary | ICD-10-CM | POA: Diagnosis not present

## 2015-07-11 DIAGNOSIS — D631 Anemia in chronic kidney disease: Secondary | ICD-10-CM | POA: Diagnosis not present

## 2015-07-11 DIAGNOSIS — N186 End stage renal disease: Secondary | ICD-10-CM | POA: Diagnosis not present

## 2015-07-11 DIAGNOSIS — E119 Type 2 diabetes mellitus without complications: Secondary | ICD-10-CM | POA: Diagnosis not present

## 2015-07-13 DIAGNOSIS — N186 End stage renal disease: Secondary | ICD-10-CM | POA: Diagnosis not present

## 2015-07-13 DIAGNOSIS — N2581 Secondary hyperparathyroidism of renal origin: Secondary | ICD-10-CM | POA: Diagnosis not present

## 2015-07-13 DIAGNOSIS — E119 Type 2 diabetes mellitus without complications: Secondary | ICD-10-CM | POA: Diagnosis not present

## 2015-07-13 DIAGNOSIS — D631 Anemia in chronic kidney disease: Secondary | ICD-10-CM | POA: Diagnosis not present

## 2015-07-14 DIAGNOSIS — E785 Hyperlipidemia, unspecified: Secondary | ICD-10-CM | POA: Diagnosis not present

## 2015-07-14 DIAGNOSIS — E119 Type 2 diabetes mellitus without complications: Secondary | ICD-10-CM | POA: Diagnosis not present

## 2015-07-14 DIAGNOSIS — T82858A Stenosis of vascular prosthetic devices, implants and grafts, initial encounter: Secondary | ICD-10-CM | POA: Diagnosis not present

## 2015-07-14 DIAGNOSIS — Z992 Dependence on renal dialysis: Secondary | ICD-10-CM | POA: Diagnosis not present

## 2015-07-14 DIAGNOSIS — E669 Obesity, unspecified: Secondary | ICD-10-CM | POA: Diagnosis not present

## 2015-07-14 DIAGNOSIS — M79609 Pain in unspecified limb: Secondary | ICD-10-CM | POA: Diagnosis not present

## 2015-07-14 DIAGNOSIS — N186 End stage renal disease: Secondary | ICD-10-CM | POA: Diagnosis not present

## 2015-07-14 DIAGNOSIS — Y841 Kidney dialysis as the cause of abnormal reaction of the patient, or of later complication, without mention of misadventure at the time of the procedure: Secondary | ICD-10-CM | POA: Diagnosis not present

## 2015-07-14 DIAGNOSIS — I10 Essential (primary) hypertension: Secondary | ICD-10-CM | POA: Diagnosis not present

## 2015-07-14 DIAGNOSIS — T82318A Breakdown (mechanical) of other vascular grafts, initial encounter: Secondary | ICD-10-CM | POA: Diagnosis not present

## 2015-07-15 DIAGNOSIS — E119 Type 2 diabetes mellitus without complications: Secondary | ICD-10-CM | POA: Diagnosis not present

## 2015-07-15 DIAGNOSIS — N186 End stage renal disease: Secondary | ICD-10-CM | POA: Diagnosis not present

## 2015-07-15 DIAGNOSIS — D631 Anemia in chronic kidney disease: Secondary | ICD-10-CM | POA: Diagnosis not present

## 2015-07-15 DIAGNOSIS — N2581 Secondary hyperparathyroidism of renal origin: Secondary | ICD-10-CM | POA: Diagnosis not present

## 2015-07-17 DIAGNOSIS — G5603 Carpal tunnel syndrome, bilateral upper limbs: Secondary | ICD-10-CM | POA: Diagnosis not present

## 2015-07-17 DIAGNOSIS — M6281 Muscle weakness (generalized): Secondary | ICD-10-CM | POA: Diagnosis not present

## 2015-07-17 DIAGNOSIS — M79642 Pain in left hand: Secondary | ICD-10-CM | POA: Diagnosis not present

## 2015-07-17 DIAGNOSIS — M79641 Pain in right hand: Secondary | ICD-10-CM | POA: Diagnosis not present

## 2015-07-18 DIAGNOSIS — N2581 Secondary hyperparathyroidism of renal origin: Secondary | ICD-10-CM | POA: Diagnosis not present

## 2015-07-18 DIAGNOSIS — E119 Type 2 diabetes mellitus without complications: Secondary | ICD-10-CM | POA: Diagnosis not present

## 2015-07-18 DIAGNOSIS — D631 Anemia in chronic kidney disease: Secondary | ICD-10-CM | POA: Diagnosis not present

## 2015-07-18 DIAGNOSIS — N186 End stage renal disease: Secondary | ICD-10-CM | POA: Diagnosis not present

## 2015-07-20 DIAGNOSIS — N2581 Secondary hyperparathyroidism of renal origin: Secondary | ICD-10-CM | POA: Diagnosis not present

## 2015-07-20 DIAGNOSIS — N186 End stage renal disease: Secondary | ICD-10-CM | POA: Diagnosis not present

## 2015-07-20 DIAGNOSIS — E119 Type 2 diabetes mellitus without complications: Secondary | ICD-10-CM | POA: Diagnosis not present

## 2015-07-20 DIAGNOSIS — D631 Anemia in chronic kidney disease: Secondary | ICD-10-CM | POA: Diagnosis not present

## 2015-07-22 DIAGNOSIS — D631 Anemia in chronic kidney disease: Secondary | ICD-10-CM | POA: Diagnosis not present

## 2015-07-22 DIAGNOSIS — N186 End stage renal disease: Secondary | ICD-10-CM | POA: Diagnosis not present

## 2015-07-22 DIAGNOSIS — N2581 Secondary hyperparathyroidism of renal origin: Secondary | ICD-10-CM | POA: Diagnosis not present

## 2015-07-22 DIAGNOSIS — E119 Type 2 diabetes mellitus without complications: Secondary | ICD-10-CM | POA: Diagnosis not present

## 2015-07-25 DIAGNOSIS — D631 Anemia in chronic kidney disease: Secondary | ICD-10-CM | POA: Diagnosis not present

## 2015-07-25 DIAGNOSIS — N2581 Secondary hyperparathyroidism of renal origin: Secondary | ICD-10-CM | POA: Diagnosis not present

## 2015-07-25 DIAGNOSIS — E119 Type 2 diabetes mellitus without complications: Secondary | ICD-10-CM | POA: Diagnosis not present

## 2015-07-25 DIAGNOSIS — N186 End stage renal disease: Secondary | ICD-10-CM | POA: Diagnosis not present

## 2015-07-27 DIAGNOSIS — N186 End stage renal disease: Secondary | ICD-10-CM | POA: Diagnosis not present

## 2015-07-27 DIAGNOSIS — D631 Anemia in chronic kidney disease: Secondary | ICD-10-CM | POA: Diagnosis not present

## 2015-07-27 DIAGNOSIS — N2581 Secondary hyperparathyroidism of renal origin: Secondary | ICD-10-CM | POA: Diagnosis not present

## 2015-07-27 DIAGNOSIS — E119 Type 2 diabetes mellitus without complications: Secondary | ICD-10-CM | POA: Diagnosis not present

## 2015-07-28 DIAGNOSIS — H40033 Anatomical narrow angle, bilateral: Secondary | ICD-10-CM | POA: Diagnosis not present

## 2015-07-29 DIAGNOSIS — N2581 Secondary hyperparathyroidism of renal origin: Secondary | ICD-10-CM | POA: Diagnosis not present

## 2015-07-29 DIAGNOSIS — E119 Type 2 diabetes mellitus without complications: Secondary | ICD-10-CM | POA: Diagnosis not present

## 2015-07-29 DIAGNOSIS — N186 End stage renal disease: Secondary | ICD-10-CM | POA: Diagnosis not present

## 2015-07-29 DIAGNOSIS — D631 Anemia in chronic kidney disease: Secondary | ICD-10-CM | POA: Diagnosis not present

## 2015-07-31 DIAGNOSIS — G5603 Carpal tunnel syndrome, bilateral upper limbs: Secondary | ICD-10-CM | POA: Diagnosis not present

## 2015-08-01 DIAGNOSIS — E119 Type 2 diabetes mellitus without complications: Secondary | ICD-10-CM | POA: Diagnosis not present

## 2015-08-01 DIAGNOSIS — D631 Anemia in chronic kidney disease: Secondary | ICD-10-CM | POA: Diagnosis not present

## 2015-08-01 DIAGNOSIS — N2581 Secondary hyperparathyroidism of renal origin: Secondary | ICD-10-CM | POA: Diagnosis not present

## 2015-08-01 DIAGNOSIS — N186 End stage renal disease: Secondary | ICD-10-CM | POA: Diagnosis not present

## 2015-08-03 DIAGNOSIS — D631 Anemia in chronic kidney disease: Secondary | ICD-10-CM | POA: Diagnosis not present

## 2015-08-03 DIAGNOSIS — N2581 Secondary hyperparathyroidism of renal origin: Secondary | ICD-10-CM | POA: Diagnosis not present

## 2015-08-03 DIAGNOSIS — N186 End stage renal disease: Secondary | ICD-10-CM | POA: Diagnosis not present

## 2015-08-03 DIAGNOSIS — E119 Type 2 diabetes mellitus without complications: Secondary | ICD-10-CM | POA: Diagnosis not present

## 2015-08-04 DIAGNOSIS — Z992 Dependence on renal dialysis: Secondary | ICD-10-CM | POA: Diagnosis not present

## 2015-08-04 DIAGNOSIS — N186 End stage renal disease: Secondary | ICD-10-CM | POA: Diagnosis not present

## 2015-08-04 DIAGNOSIS — E1122 Type 2 diabetes mellitus with diabetic chronic kidney disease: Secondary | ICD-10-CM | POA: Diagnosis not present

## 2015-08-05 DIAGNOSIS — D631 Anemia in chronic kidney disease: Secondary | ICD-10-CM | POA: Diagnosis not present

## 2015-08-05 DIAGNOSIS — N186 End stage renal disease: Secondary | ICD-10-CM | POA: Diagnosis not present

## 2015-08-05 DIAGNOSIS — E119 Type 2 diabetes mellitus without complications: Secondary | ICD-10-CM | POA: Diagnosis not present

## 2015-08-05 DIAGNOSIS — N2581 Secondary hyperparathyroidism of renal origin: Secondary | ICD-10-CM | POA: Diagnosis not present

## 2015-08-08 DIAGNOSIS — N186 End stage renal disease: Secondary | ICD-10-CM | POA: Diagnosis not present

## 2015-08-08 DIAGNOSIS — E119 Type 2 diabetes mellitus without complications: Secondary | ICD-10-CM | POA: Diagnosis not present

## 2015-08-08 DIAGNOSIS — D631 Anemia in chronic kidney disease: Secondary | ICD-10-CM | POA: Diagnosis not present

## 2015-08-08 DIAGNOSIS — N2581 Secondary hyperparathyroidism of renal origin: Secondary | ICD-10-CM | POA: Diagnosis not present

## 2015-08-10 ENCOUNTER — Other Ambulatory Visit: Payer: Self-pay | Admitting: Vascular Surgery

## 2015-08-10 ENCOUNTER — Ambulatory Visit
Admission: RE | Admit: 2015-08-10 | Discharge: 2015-08-10 | Disposition: A | Discharge status: Home / Self Care | Payer: Medicare Other | Source: Ambulatory Visit | Attending: Vascular Surgery | Admitting: Vascular Surgery

## 2015-08-10 ENCOUNTER — Encounter: Payer: Self-pay | Admitting: *Deleted

## 2015-08-10 ENCOUNTER — Encounter
Admission: RE | Disposition: A | Discharge status: Home / Self Care | Payer: Self-pay | Source: Ambulatory Visit | Attending: Vascular Surgery

## 2015-08-10 DIAGNOSIS — D631 Anemia in chronic kidney disease: Secondary | ICD-10-CM | POA: Diagnosis not present

## 2015-08-10 DIAGNOSIS — M79609 Pain in unspecified limb: Secondary | ICD-10-CM | POA: Diagnosis not present

## 2015-08-10 DIAGNOSIS — E669 Obesity, unspecified: Secondary | ICD-10-CM | POA: Diagnosis not present

## 2015-08-10 DIAGNOSIS — Z91041 Radiographic dye allergy status: Secondary | ICD-10-CM | POA: Insufficient documentation

## 2015-08-10 DIAGNOSIS — E119 Type 2 diabetes mellitus without complications: Secondary | ICD-10-CM | POA: Diagnosis not present

## 2015-08-10 DIAGNOSIS — Z992 Dependence on renal dialysis: Secondary | ICD-10-CM | POA: Insufficient documentation

## 2015-08-10 DIAGNOSIS — I120 Hypertensive chronic kidney disease with stage 5 chronic kidney disease or end stage renal disease: Secondary | ICD-10-CM | POA: Diagnosis not present

## 2015-08-10 DIAGNOSIS — Z7982 Long term (current) use of aspirin: Secondary | ICD-10-CM | POA: Insufficient documentation

## 2015-08-10 DIAGNOSIS — I2510 Atherosclerotic heart disease of native coronary artery without angina pectoris: Secondary | ICD-10-CM | POA: Insufficient documentation

## 2015-08-10 DIAGNOSIS — Z90710 Acquired absence of both cervix and uterus: Secondary | ICD-10-CM | POA: Diagnosis not present

## 2015-08-10 DIAGNOSIS — T82868A Thrombosis of vascular prosthetic devices, implants and grafts, initial encounter: Secondary | ICD-10-CM | POA: Insufficient documentation

## 2015-08-10 DIAGNOSIS — Z885 Allergy status to narcotic agent status: Secondary | ICD-10-CM | POA: Diagnosis not present

## 2015-08-10 DIAGNOSIS — N186 End stage renal disease: Secondary | ICD-10-CM | POA: Insufficient documentation

## 2015-08-10 DIAGNOSIS — M79603 Pain in arm, unspecified: Secondary | ICD-10-CM | POA: Diagnosis not present

## 2015-08-10 DIAGNOSIS — Z9889 Other specified postprocedural states: Secondary | ICD-10-CM | POA: Diagnosis not present

## 2015-08-10 DIAGNOSIS — I10 Essential (primary) hypertension: Secondary | ICD-10-CM | POA: Diagnosis not present

## 2015-08-10 DIAGNOSIS — K219 Gastro-esophageal reflux disease without esophagitis: Secondary | ICD-10-CM | POA: Diagnosis not present

## 2015-08-10 DIAGNOSIS — E1122 Type 2 diabetes mellitus with diabetic chronic kidney disease: Secondary | ICD-10-CM | POA: Insufficient documentation

## 2015-08-10 DIAGNOSIS — T82318A Breakdown (mechanical) of other vascular grafts, initial encounter: Secondary | ICD-10-CM | POA: Diagnosis not present

## 2015-08-10 DIAGNOSIS — Z79899 Other long term (current) drug therapy: Secondary | ICD-10-CM | POA: Diagnosis not present

## 2015-08-10 DIAGNOSIS — Y832 Surgical operation with anastomosis, bypass or graft as the cause of abnormal reaction of the patient, or of later complication, without mention of misadventure at the time of the procedure: Secondary | ICD-10-CM | POA: Insufficient documentation

## 2015-08-10 DIAGNOSIS — T82858A Stenosis of vascular prosthetic devices, implants and grafts, initial encounter: Secondary | ICD-10-CM | POA: Diagnosis not present

## 2015-08-10 DIAGNOSIS — Z955 Presence of coronary angioplasty implant and graft: Secondary | ICD-10-CM | POA: Insufficient documentation

## 2015-08-10 DIAGNOSIS — E785 Hyperlipidemia, unspecified: Secondary | ICD-10-CM | POA: Diagnosis not present

## 2015-08-10 DIAGNOSIS — Y841 Kidney dialysis as the cause of abnormal reaction of the patient, or of later complication, without mention of misadventure at the time of the procedure: Secondary | ICD-10-CM | POA: Diagnosis not present

## 2015-08-10 DIAGNOSIS — N2581 Secondary hyperparathyroidism of renal origin: Secondary | ICD-10-CM | POA: Diagnosis not present

## 2015-08-10 HISTORY — PX: PERIPHERAL VASCULAR CATHETERIZATION: SHX172C

## 2015-08-10 LAB — POTASSIUM (ARMC VASCULAR LAB ONLY): POTASSIUM (ARMC VASCULAR LAB): 4.1 (ref 3.5–5.1)

## 2015-08-10 SURGERY — THROMBECTOMY
Anesthesia: Moderate Sedation

## 2015-08-10 MED ORDER — HEPARIN SODIUM (PORCINE) 1000 UNIT/ML IJ SOLN
INTRAMUSCULAR | Status: AC
  Filled 2015-08-10: qty 1

## 2015-08-10 MED ORDER — ALTEPLASE 2 MG IJ SOLR
INTRAMUSCULAR | Status: AC
  Filled 2015-08-10: qty 4

## 2015-08-10 MED ORDER — LIDOCAINE-EPINEPHRINE (PF) 1 %-1:200000 IJ SOLN
INTRAMUSCULAR | Status: DC | PRN
  Administered 2015-08-10: 10 mL via INTRADERMAL

## 2015-08-10 MED ORDER — FAMOTIDINE 20 MG PO TABS
ORAL_TABLET | ORAL | Status: AC
  Filled 2015-08-10: qty 1

## 2015-08-10 MED ORDER — FENTANYL CITRATE (PF) 100 MCG/2ML IJ SOLN
INTRAMUSCULAR | Status: AC
  Filled 2015-08-10: qty 2

## 2015-08-10 MED ORDER — FENTANYL CITRATE (PF) 100 MCG/2ML IJ SOLN
INTRAMUSCULAR | Status: DC | PRN
  Administered 2015-08-10 (×2): 50 ug via INTRAVENOUS

## 2015-08-10 MED ORDER — MIDAZOLAM HCL 2 MG/2ML IJ SOLN
INTRAMUSCULAR | Status: DC | PRN
  Administered 2015-08-10: 2 mg via INTRAVENOUS
  Administered 2015-08-10: 1 mg via INTRAVENOUS

## 2015-08-10 MED ORDER — MIDAZOLAM HCL 5 MG/5ML IJ SOLN
INTRAMUSCULAR | Status: AC
  Filled 2015-08-10: qty 5

## 2015-08-10 MED ORDER — SODIUM CHLORIDE 0.9 % IV SOLN
INTRAVENOUS | Status: DC
Start: 2015-08-10 — End: 2015-08-10
  Administered 2015-08-10: 11:00:00 via INTRAVENOUS

## 2015-08-10 MED ORDER — ONDANSETRON HCL 4 MG/2ML IJ SOLN: 4.0000 mg | Freq: Four times a day (QID) | INTRAMUSCULAR | Status: DC | PRN

## 2015-08-10 MED ORDER — HEPARIN SODIUM (PORCINE) 1000 UNIT/ML IJ SOLN
INTRAMUSCULAR | Status: DC | PRN
  Administered 2015-08-10: 4000 [IU] via INTRAVENOUS

## 2015-08-10 MED ORDER — HYDROMORPHONE HCL 1 MG/ML IJ SOLN: 1.0000 mg | Freq: Once | INTRAMUSCULAR | Status: DC

## 2015-08-10 MED ORDER — FAMOTIDINE 20 MG PO TABS
ORAL_TABLET | ORAL | Status: AC
  Administered 2015-08-10: 40 mg via ORAL
  Filled 2015-08-10: qty 1

## 2015-08-10 MED ORDER — FAMOTIDINE 20 MG PO TABS
40.0000 mg | ORAL_TABLET | ORAL | Status: DC | PRN
Start: 2015-08-10 — End: 2015-08-10
  Administered 2015-08-10: 40 mg via ORAL

## 2015-08-10 MED ORDER — CLINDAMYCIN PHOSPHATE 300 MG/50ML IV SOLN
300.0000 mg | Freq: Once | INTRAVENOUS | Status: AC
  Administered 2015-08-10: 300 mg via INTRAVENOUS

## 2015-08-10 MED ORDER — HEPARIN (PORCINE) IN NACL 2-0.9 UNIT/ML-% IJ SOLN
INTRAMUSCULAR | Status: AC
  Filled 2015-08-10: qty 1000

## 2015-08-10 MED ORDER — METHYLPREDNISOLONE SODIUM SUCC 125 MG IJ SOLR
INTRAMUSCULAR | Status: AC
  Administered 2015-08-10: 125 mg via INTRAVENOUS
  Filled 2015-08-10: qty 2

## 2015-08-10 MED ORDER — ALTEPLASE 2 MG IJ SOLR
INTRAMUSCULAR | Status: DC | PRN
Start: 2015-08-10 — End: 2015-08-10
  Administered 2015-08-10: 4 mg

## 2015-08-10 MED ORDER — LIDOCAINE-EPINEPHRINE (PF) 1 %-1:200000 IJ SOLN
INTRAMUSCULAR | Status: AC
  Filled 2015-08-10: qty 30

## 2015-08-10 MED ORDER — IOPAMIDOL (ISOVUE-300) INJECTION 61%
INTRAVENOUS | Status: DC | PRN
  Administered 2015-08-10: 40 mL via INTRAVENOUS

## 2015-08-10 MED ORDER — METHYLPREDNISOLONE SODIUM SUCC 125 MG IJ SOLR
125.0000 mg | INTRAMUSCULAR | Status: DC | PRN
  Administered 2015-08-10: 125 mg via INTRAVENOUS

## 2015-08-10 SURGICAL SUPPLY — 16 items
BALLN DORADO 8X60X80 (BALLOONS) ×3
BALLOON DORADO 8X60X80 (BALLOONS) ×1 IMPLANT
CANNULA 5F STIFF (CANNULA) ×3 IMPLANT
CATH EMBOLECTOMY 5FR (BALLOONS) ×3 IMPLANT
DEVICE PRESTO INFLATION (MISCELLANEOUS) ×3 IMPLANT
DRAPE BRACHIAL (DRAPES) ×3 IMPLANT
PACK ANGIOGRAPHY (CUSTOM PROCEDURE TRAY) ×3 IMPLANT
SET AVX THROMB ULT (MISCELLANEOUS) ×3 IMPLANT
SHEATH BRITE TIP 6FRX5.5 (SHEATH) ×6 IMPLANT
SHEATH BRITE TIP 7FRX5.5 (SHEATH) ×3 IMPLANT
STENT VIABAHN 8X150X120 (Permanent Stent) ×2 IMPLANT
STENT VIABAHN 8X15X120 7FR (Permanent Stent) ×1 IMPLANT
SUT MNCRL AB 4-0 PS2 18 (SUTURE) ×3 IMPLANT
TOWEL OR 17X26 4PK STRL BLUE (TOWEL DISPOSABLE) ×3 IMPLANT
WIRE G 018X200 V18 (WIRE) ×3 IMPLANT
WIRE MAGIC TOR.035 180C (WIRE) ×6 IMPLANT

## 2015-08-10 NOTE — H&P (Signed)
[place] [facility] Admission History & Physical  MRN : [id number]  Kelli Nguyen is a 70 y.o. (11-29-1945) female who presents with chief complaint of No chief complaint on file. [name]  History of Present Illness: Patient is a 70 year old female sent from her dialysis access center for a clotted access. This worked fine for her access Tuesday. She denies fevers or chills or signs systemic infection. She has no pain over the access. I dialysis today, it was noted to be clotted and she was sent here for an attempted salvage.  Current Facility-Administered Medications  Medication Dose Route Frequency Provider Last Rate Last Dose  . 0.9 %  sodium chloride infusion   Intravenous Continuous Kimberly A Stegmayer, PA-C 10 mL/hr at 08/10/15 1031    . clindamycin (CLEOCIN) IVPB 300 mg  300 mg Intravenous Once [employer], PA-C      . famotidine (PEPCID) 20 MG tablet           . famotidine (PEPCID) tablet 40 mg  40 mg Oral PRN Janalyn Harder Stegmayer, PA-C   40 mg at 08/10/15 1032  . HYDROmorphone (DILAUDID) injection 1 mg  1 mg Intravenous Once [employer], PA-C      . methylPREDNISolone sodium succinate (SOLU-MEDROL) 125 mg/2 mL injection 125 mg  125 mg Intravenous PRN Janalyn Harder Stegmayer, PA-C   125 mg at 08/10/15 1034  . ondansetron (ZOFRAN) injection 4 mg  4 mg Intravenous Q6H PRN Sela Hua, PA-C        Past Medical History  Diagnosis Date  . Anemia   . Hypertension   . Diabetes mellitus without complication ([place])   . Chronic kidney disease   . GERD (gastroesophageal reflux disease)   . Coronary artery disease   . Chronic cough     Past Surgical History  Procedure Laterality Date  . Abdominal hysterectomy    . Coronary artery bypass graft    . Colonoscopy    . Renal shunts    . Colonoscopy with propofol N/A 11/28/2014    Procedure: COLONOSCOPY WITH PROPOFOL;  Surgeon: Manya Silvas, MD;  Location: [facility] ENDOSCOPY;  Service: Endoscopy;   Laterality: N/A;  . Peripheral vascular catheterization Left 01/18/2015    Procedure: A/V Shuntogram/Fistulagram;  Surgeon: Katha Cabal, MD;  Location: [place] [facility];  Service: Cardiovascular;  Laterality: Left;  . Peripheral vascular catheterization Left 01/18/2015    Procedure: A/V Shunt Intervention;  Surgeon: Katha Cabal, MD;  Location: [place] [facility];  Service: Cardiovascular;  Laterality: Left;    Social History Social History  Substance Use Topics  . Smoking status: Never Smoker   . Smokeless tobacco: Never Used  . Alcohol Use: No  No IV drug use  Family History No bleeding disorders, clotting disorders, autoimmune diseases, or aneurysms  Allergies  Allergen Reactions  . Codeine Sulfate Nausea Only  . Gadolinium Derivatives   . Penicillins Other (See Comments)     REVIEW OF SYSTEMS (Negative unless checked)  Constitutional: [] Weight loss  [] Fever  [] Chills Cardiac: [] Chest pain   [] Chest pressure   [] Palpitations   [] Shortness of breath when laying flat   [] Shortness of breath at rest   [] Shortness of breath with exertion. Vascular:  [] Pain in legs with walking   [] Pain in legs at rest   [] Pain in legs when laying flat   [] Claudication   [] Pain in feet when walking  [] Pain in feet at rest  [] Pain in feet  when laying flat   [] History of DVT   [] Phlebitis   [] Swelling in legs   [] Varicose veins   [] Non-healing ulcers Pulmonary:   [] Uses home oxygen   [] Productive cough   [] Hemoptysis   [] Wheeze  [] COPD   [] Asthma Neurologic:  [] Dizziness  [] Blackouts   [] Seizures   [] History of stroke   [] History of TIA  [] Aphasia   [] Temporary blindness   [] Dysphagia   [] Weakness or numbness in arms   [] Weakness or numbness in legs Musculoskeletal:  [] Arthritis   [] Joint swelling   [] Joint pain   [] Low back pain Hematologic:  [] Easy bruising  [] Easy bleeding   [] Hypercoagulable state   [] Anemic  [] Hepatitis Gastrointestinal:  [] Blood in stool   [] Vomiting blood   [] Gastroesophageal reflux/heartburn   [] Difficulty swallowing. Genitourinary:  [x] Chronic kidney disease   [] Difficult urination  [] Frequent urination  [] Burning with urination   [] Blood in urine Skin:  [] Rashes   [] Ulcers   [] Wounds Psychological:  [] History of anxiety   []  History of major depression.  Physical Examination  Filed Vitals:   08/10/15 1011  BP: 181/93  Pulse: 59  Temp: 97.8 F (36.6 C)  TempSrc: Oral  Resp: 16  Height: 4\' 11"  (1.499 m)  Weight: 84.823 kg (187 lb)  SpO2: 95%   Body mass index is 37.75 kg/(m^2). Gen: WD/WN, NAD Head: [place]/AT, No temporalis wasting. Prominent temp pulse not noted. Ear/Nose/Throat: Hearing grossly intact, nares w/o erythema or drainage, oropharynx w/o Erythema/Exudate,  Eyes: PERRLA, EOMI.  Neck: Supple, no nuchal rigidity.  No JVD.  Pulmonary:  Good air movement, equal bilaterally, no use of accessory muscles.  Cardiac: RRR, normal S1, S2 Vascular: No bruit or thrill present in the access Vessel Right Left  Radial Palpable Palpable                                   Gastrointestinal: soft, non-tender/non-distended. No guarding/reflex.  Musculoskeletal: M/S 5/5 throughout.  Extremities without ischemic changes.  No deformity or atrophy.  Neurologic: CN 2-12 intact. Pain and light touch intact in extremities.  Symmetrical.  Speech is fluent. Motor exam as listed above. Psychiatric: Judgment intact, Mood & affect appropriate for pt's clinical situation. Dermatologic: No rashes or ulcers noted.  No cellulitis or open wounds. Lymph : No Cervical, Axillary, or Inguinal lymphadenopathy.     CBC Lab Results  Component Value Date   WBC 7.5 03/29/2013   HGB 10.1* 03/29/2013   HCT 31.2* 03/29/2013   MCV 85 03/29/2013   PLT 189 03/29/2013    BMET    Component Value Date/Time   NA 135* 03/29/2013 1409   K 4.9 01/18/2015 0759   K 4.5 03/29/2013 1409   CL 97* 03/29/2013 1409   CO2 27 03/29/2013 1409   GLUCOSE 97  03/29/2013 1409   BUN 48* 03/29/2013 1409   CREATININE 12.06* 03/29/2013 1409   CALCIUM 8.8 03/29/2013 1409   GFRNONAA 3* 03/29/2013 1409   GFRAA 3* 03/29/2013 1409   CrCl cannot be calculated (Patient has no serum creatinine result on file.).  COAG No results found for: INR, PROTIME  Radiology No results found.    Assessment/Plan 1. Clotted dialysis access. Will attempt salvage today. Risks and benefits discussed. 2. End-stage renal disease. Needs functional access.  3. Hypertension. Stable and her outpatient medications.   DEW,JASON, MD  08/10/2015 10:36 AM

## 2015-08-10 NOTE — Op Note (Signed)
[facility]    OPERATIVE NOTE   PROCEDURE: 1.  Left arm HeRO graft cannulation under ultrasound guidance in both a retrograde and then antegrade fashion crossing 2.  Left arm shuntogram and central venogram 3.  Catheter directed thrombolysis with 4 mg of TPA delivered with the AngioJet AVX catheter 4.  Mechanical rheolytic thrombectomy to the HeRO graft with the AngioJet AVX catheter 5.  Fogarty embolectomy for residual arterial plug 6.  Percutaneous transluminal angioplasty of the mid and distal graft with 8 mm diameter by 6 cm length angioplasty balloon 3 7.  Viabahn covered stent placement to the mid and distal PTFE portion of the graft with 8 mm diameter by 15 cm length covered stent residual stenosis and thrombosis after angioplasty  PRE-OPERATIVE DIAGNOSIS: 1. ESRD 2.  Thrombosed left arm HeRO graft  POST-OPERATIVE DIAGNOSIS: same as above   SURGEON: Leotis Pain, MD  ANESTHESIA: local with Moderate Conscious Sedation   ESTIMATED BLOOD LOSS: 25 cc  FINDING(S): 1. Thrombosed HeRO graft, opened with intervention  SPECIMEN(S):  None  CONTRAST: 40 cc  FLUORO TIME: 5.9 minutes  MODERATE CONSCIOUS SEDATION TIME: Approximately 50 minutes with 3 mg of Versed and 100 mcg of Fentanyl  INDICATIONS: Patient is a 70 y.o.female who presents with a thrombosed left arm HeRO graft.  The patient is scheduled for an attempted declot and shuntogram.  The patient is aware the risks include but are not limited to: bleeding, infection, thrombosis of the cannulated access, and possible anaphylactic reaction to the contrast.  The patient is aware of the risks of the procedure and elects to proceed forward.  DESCRIPTION: After full informed written consent was obtained, the patient was brought back to the angiography suite and placed supine upon the angiography table.  The patient was connected to monitoring equipment.  Moderate conscious sedation was administered during a  face to face encounter with the patient throughout the procedure with my supervision of the RN administering medicines and monitoring the patient's vital signs and mental status throughout from the start of the procedure until the patient was taken to the recovery room.  The  left arm was prepped and draped in the standard fashion for a percutaneous access intervention.  Under ultrasound guidance, the left arm HeRO graft was cannulated with a micropuncture needle under direct ultrasound guidance due to the pulseless nature of the graft in both an antegrade and a retrograde fashion crossing, and permanent images were performed.  The microwire was advanced and the needle was exchanged for the a microsheath.  I then upsized to a 6 Fr Sheath and imaging was performed.  Hand injections were completed to image the access including the central venous system. This demonstrated no flow within the HeRO graft.  Based on the images, this patient will need extensive treatment to salvage the graft. I then gave the patient 4000 units of intravenous heparin.  I then placed a Magic torque wire into the brachial artery from the retrograde sheath and into the right atrium from the antegrade sheath. 4 mg of TPA were deployed throughout the entirety of the PTFE portion of the graft and into the brachial artery at the anastomosis. This was allowed to dwell for 10-15 minutes. Mechanical rheolytic thrombectomy was then performed throughout the PTFE portion of the HeRO graft and into the brachial artery at the anastomosis. This uncovered thrombus and occlusion in the mid to distal graft and a residual arterial access site and proximal graft plug was also  seen. An attempt to clear the arterial plug was done with 2 passes of the Fogarty embolectomy balloon. This resulted in resolution of the arterial plug, and clearance of the arterial side of the graft. The arterial outflow was seen to be intact distal to the graft on these images. The  retrograde sheath was removed as a 4-0 Monocryl pursestring suture was placed. I then turned my attention to the thrombus in the distal graft. Mechanical rheolytic thrombectomy was performed again. This resulted in improvement but not resolution of the thrombus within the mid and distal graft.  I then elected to treat this with an 8 mm diameter angioplasty balloon. The longest 49m high pressure angioplasty balloon we had was 6 cm, so 3 inflations were performed. Each inflation was done to 14-16 atm. Following this, there were still 2 areas of residual thrombus and flow limitation. One was in the distal portion of the graft near the grommet connector to the central portion and one was at the end of the stent near the venous access site. I exchanged for a 7 French sheath and an 0.018 wire. I then covered these areas with an 8 mm diameter by 15 cm length Viabahn stent and postdilated this with an 8 mm balloon. Following this, there was resolution of the thrombosis with brisk flow and no significant stenosis identified.    Based on the completion imaging, no further intervention is necessary.  The wire and balloon were removed from the sheath.  A 4-0 Monocryl purse-string suture was sewn around the sheath.  The sheath was removed while tying down the suture.  A sterile bandage was applied to the puncture site.  COMPLICATIONS: None  CONDITION: Stable   Kelli Nguyen 08/10/2015 11:53 AM

## 2015-08-11 ENCOUNTER — Encounter: Payer: Self-pay | Admitting: Vascular Surgery

## 2015-08-11 DIAGNOSIS — N2889 Other specified disorders of kidney and ureter: Secondary | ICD-10-CM | POA: Diagnosis not present

## 2015-08-11 DIAGNOSIS — E119 Type 2 diabetes mellitus without complications: Secondary | ICD-10-CM | POA: Diagnosis not present

## 2015-08-11 DIAGNOSIS — D631 Anemia in chronic kidney disease: Secondary | ICD-10-CM | POA: Diagnosis not present

## 2015-08-11 DIAGNOSIS — I2510 Atherosclerotic heart disease of native coronary artery without angina pectoris: Secondary | ICD-10-CM | POA: Diagnosis not present

## 2015-08-11 DIAGNOSIS — N186 End stage renal disease: Secondary | ICD-10-CM | POA: Diagnosis not present

## 2015-08-11 DIAGNOSIS — N2581 Secondary hyperparathyroidism of renal origin: Secondary | ICD-10-CM | POA: Diagnosis not present

## 2015-08-11 DIAGNOSIS — I151 Hypertension secondary to other renal disorders: Secondary | ICD-10-CM | POA: Diagnosis not present

## 2015-08-12 DIAGNOSIS — D631 Anemia in chronic kidney disease: Secondary | ICD-10-CM | POA: Diagnosis not present

## 2015-08-12 DIAGNOSIS — N186 End stage renal disease: Secondary | ICD-10-CM | POA: Diagnosis not present

## 2015-08-12 DIAGNOSIS — E119 Type 2 diabetes mellitus without complications: Secondary | ICD-10-CM | POA: Diagnosis not present

## 2015-08-12 DIAGNOSIS — N2581 Secondary hyperparathyroidism of renal origin: Secondary | ICD-10-CM | POA: Diagnosis not present

## 2015-08-15 DIAGNOSIS — N186 End stage renal disease: Secondary | ICD-10-CM | POA: Diagnosis not present

## 2015-08-15 DIAGNOSIS — D631 Anemia in chronic kidney disease: Secondary | ICD-10-CM | POA: Diagnosis not present

## 2015-08-15 DIAGNOSIS — N2581 Secondary hyperparathyroidism of renal origin: Secondary | ICD-10-CM | POA: Diagnosis not present

## 2015-08-15 DIAGNOSIS — E119 Type 2 diabetes mellitus without complications: Secondary | ICD-10-CM | POA: Diagnosis not present

## 2015-08-17 DIAGNOSIS — D631 Anemia in chronic kidney disease: Secondary | ICD-10-CM | POA: Diagnosis not present

## 2015-08-17 DIAGNOSIS — N2581 Secondary hyperparathyroidism of renal origin: Secondary | ICD-10-CM | POA: Diagnosis not present

## 2015-08-17 DIAGNOSIS — E119 Type 2 diabetes mellitus without complications: Secondary | ICD-10-CM | POA: Diagnosis not present

## 2015-08-17 DIAGNOSIS — N186 End stage renal disease: Secondary | ICD-10-CM | POA: Diagnosis not present

## 2015-08-19 DIAGNOSIS — N2581 Secondary hyperparathyroidism of renal origin: Secondary | ICD-10-CM | POA: Diagnosis not present

## 2015-08-19 DIAGNOSIS — N186 End stage renal disease: Secondary | ICD-10-CM | POA: Diagnosis not present

## 2015-08-19 DIAGNOSIS — E119 Type 2 diabetes mellitus without complications: Secondary | ICD-10-CM | POA: Diagnosis not present

## 2015-08-19 DIAGNOSIS — D631 Anemia in chronic kidney disease: Secondary | ICD-10-CM | POA: Diagnosis not present

## 2015-08-22 DIAGNOSIS — N2581 Secondary hyperparathyroidism of renal origin: Secondary | ICD-10-CM | POA: Diagnosis not present

## 2015-08-22 DIAGNOSIS — N186 End stage renal disease: Secondary | ICD-10-CM | POA: Diagnosis not present

## 2015-08-22 DIAGNOSIS — E119 Type 2 diabetes mellitus without complications: Secondary | ICD-10-CM | POA: Diagnosis not present

## 2015-08-22 DIAGNOSIS — D631 Anemia in chronic kidney disease: Secondary | ICD-10-CM | POA: Diagnosis not present

## 2015-08-24 DIAGNOSIS — N2581 Secondary hyperparathyroidism of renal origin: Secondary | ICD-10-CM | POA: Diagnosis not present

## 2015-08-24 DIAGNOSIS — E119 Type 2 diabetes mellitus without complications: Secondary | ICD-10-CM | POA: Diagnosis not present

## 2015-08-24 DIAGNOSIS — N186 End stage renal disease: Secondary | ICD-10-CM | POA: Diagnosis not present

## 2015-08-24 DIAGNOSIS — D631 Anemia in chronic kidney disease: Secondary | ICD-10-CM | POA: Diagnosis not present

## 2015-08-26 DIAGNOSIS — D631 Anemia in chronic kidney disease: Secondary | ICD-10-CM | POA: Diagnosis not present

## 2015-08-26 DIAGNOSIS — N186 End stage renal disease: Secondary | ICD-10-CM | POA: Diagnosis not present

## 2015-08-26 DIAGNOSIS — N2581 Secondary hyperparathyroidism of renal origin: Secondary | ICD-10-CM | POA: Diagnosis not present

## 2015-08-26 DIAGNOSIS — E119 Type 2 diabetes mellitus without complications: Secondary | ICD-10-CM | POA: Diagnosis not present

## 2015-08-29 DIAGNOSIS — N2581 Secondary hyperparathyroidism of renal origin: Secondary | ICD-10-CM | POA: Diagnosis not present

## 2015-08-29 DIAGNOSIS — D631 Anemia in chronic kidney disease: Secondary | ICD-10-CM | POA: Diagnosis not present

## 2015-08-29 DIAGNOSIS — E119 Type 2 diabetes mellitus without complications: Secondary | ICD-10-CM | POA: Diagnosis not present

## 2015-08-29 DIAGNOSIS — N186 End stage renal disease: Secondary | ICD-10-CM | POA: Diagnosis not present

## 2015-08-31 DIAGNOSIS — N186 End stage renal disease: Secondary | ICD-10-CM | POA: Diagnosis not present

## 2015-08-31 DIAGNOSIS — E119 Type 2 diabetes mellitus without complications: Secondary | ICD-10-CM | POA: Diagnosis not present

## 2015-08-31 DIAGNOSIS — D631 Anemia in chronic kidney disease: Secondary | ICD-10-CM | POA: Diagnosis not present

## 2015-08-31 DIAGNOSIS — N2581 Secondary hyperparathyroidism of renal origin: Secondary | ICD-10-CM | POA: Diagnosis not present

## 2015-09-02 DIAGNOSIS — D631 Anemia in chronic kidney disease: Secondary | ICD-10-CM | POA: Diagnosis not present

## 2015-09-02 DIAGNOSIS — N186 End stage renal disease: Secondary | ICD-10-CM | POA: Diagnosis not present

## 2015-09-02 DIAGNOSIS — E119 Type 2 diabetes mellitus without complications: Secondary | ICD-10-CM | POA: Diagnosis not present

## 2015-09-02 DIAGNOSIS — N2581 Secondary hyperparathyroidism of renal origin: Secondary | ICD-10-CM | POA: Diagnosis not present

## 2015-09-03 DIAGNOSIS — E1122 Type 2 diabetes mellitus with diabetic chronic kidney disease: Secondary | ICD-10-CM | POA: Diagnosis not present

## 2015-09-03 DIAGNOSIS — N186 End stage renal disease: Secondary | ICD-10-CM | POA: Diagnosis not present

## 2015-09-03 DIAGNOSIS — Z992 Dependence on renal dialysis: Secondary | ICD-10-CM | POA: Diagnosis not present

## 2015-09-05 DIAGNOSIS — N2581 Secondary hyperparathyroidism of renal origin: Secondary | ICD-10-CM | POA: Diagnosis not present

## 2015-09-05 DIAGNOSIS — D631 Anemia in chronic kidney disease: Secondary | ICD-10-CM | POA: Diagnosis not present

## 2015-09-05 DIAGNOSIS — E119 Type 2 diabetes mellitus without complications: Secondary | ICD-10-CM | POA: Diagnosis not present

## 2015-09-05 DIAGNOSIS — N186 End stage renal disease: Secondary | ICD-10-CM | POA: Diagnosis not present

## 2015-09-07 DIAGNOSIS — N186 End stage renal disease: Secondary | ICD-10-CM | POA: Diagnosis not present

## 2015-09-07 DIAGNOSIS — E119 Type 2 diabetes mellitus without complications: Secondary | ICD-10-CM | POA: Diagnosis not present

## 2015-09-07 DIAGNOSIS — N2581 Secondary hyperparathyroidism of renal origin: Secondary | ICD-10-CM | POA: Diagnosis not present

## 2015-09-07 DIAGNOSIS — D631 Anemia in chronic kidney disease: Secondary | ICD-10-CM | POA: Diagnosis not present

## 2015-09-09 DIAGNOSIS — N186 End stage renal disease: Secondary | ICD-10-CM | POA: Diagnosis not present

## 2015-09-09 DIAGNOSIS — N2581 Secondary hyperparathyroidism of renal origin: Secondary | ICD-10-CM | POA: Diagnosis not present

## 2015-09-09 DIAGNOSIS — D631 Anemia in chronic kidney disease: Secondary | ICD-10-CM | POA: Diagnosis not present

## 2015-09-09 DIAGNOSIS — E119 Type 2 diabetes mellitus without complications: Secondary | ICD-10-CM | POA: Diagnosis not present

## 2015-09-12 DIAGNOSIS — D631 Anemia in chronic kidney disease: Secondary | ICD-10-CM | POA: Diagnosis not present

## 2015-09-12 DIAGNOSIS — E119 Type 2 diabetes mellitus without complications: Secondary | ICD-10-CM | POA: Diagnosis not present

## 2015-09-12 DIAGNOSIS — N2581 Secondary hyperparathyroidism of renal origin: Secondary | ICD-10-CM | POA: Diagnosis not present

## 2015-09-12 DIAGNOSIS — N186 End stage renal disease: Secondary | ICD-10-CM | POA: Diagnosis not present

## 2015-09-14 DIAGNOSIS — E119 Type 2 diabetes mellitus without complications: Secondary | ICD-10-CM | POA: Diagnosis not present

## 2015-09-14 DIAGNOSIS — D631 Anemia in chronic kidney disease: Secondary | ICD-10-CM | POA: Diagnosis not present

## 2015-09-14 DIAGNOSIS — N2581 Secondary hyperparathyroidism of renal origin: Secondary | ICD-10-CM | POA: Diagnosis not present

## 2015-09-14 DIAGNOSIS — N186 End stage renal disease: Secondary | ICD-10-CM | POA: Diagnosis not present

## 2015-09-16 DIAGNOSIS — N2581 Secondary hyperparathyroidism of renal origin: Secondary | ICD-10-CM | POA: Diagnosis not present

## 2015-09-16 DIAGNOSIS — D631 Anemia in chronic kidney disease: Secondary | ICD-10-CM | POA: Diagnosis not present

## 2015-09-16 DIAGNOSIS — N186 End stage renal disease: Secondary | ICD-10-CM | POA: Diagnosis not present

## 2015-09-16 DIAGNOSIS — E119 Type 2 diabetes mellitus without complications: Secondary | ICD-10-CM | POA: Diagnosis not present

## 2015-09-19 DIAGNOSIS — N186 End stage renal disease: Secondary | ICD-10-CM | POA: Diagnosis not present

## 2015-09-19 DIAGNOSIS — E119 Type 2 diabetes mellitus without complications: Secondary | ICD-10-CM | POA: Diagnosis not present

## 2015-09-19 DIAGNOSIS — N2581 Secondary hyperparathyroidism of renal origin: Secondary | ICD-10-CM | POA: Diagnosis not present

## 2015-09-19 DIAGNOSIS — D631 Anemia in chronic kidney disease: Secondary | ICD-10-CM | POA: Diagnosis not present

## 2015-09-21 ENCOUNTER — Encounter
Admission: RE | Admit: 2015-09-21 | Discharge: 2015-09-21 | Disposition: A | Discharge status: Home / Self Care | Payer: Medicare Other | Source: Ambulatory Visit | Attending: Unknown Physician Specialty | Admitting: Unknown Physician Specialty

## 2015-09-21 DIAGNOSIS — K219 Gastro-esophageal reflux disease without esophagitis: Secondary | ICD-10-CM | POA: Diagnosis not present

## 2015-09-21 DIAGNOSIS — Z01812 Encounter for preprocedural laboratory examination: Secondary | ICD-10-CM | POA: Diagnosis not present

## 2015-09-21 DIAGNOSIS — E119 Type 2 diabetes mellitus without complications: Secondary | ICD-10-CM | POA: Diagnosis not present

## 2015-09-21 DIAGNOSIS — D631 Anemia in chronic kidney disease: Secondary | ICD-10-CM | POA: Diagnosis not present

## 2015-09-21 DIAGNOSIS — D649 Anemia, unspecified: Secondary | ICD-10-CM | POA: Insufficient documentation

## 2015-09-21 DIAGNOSIS — I2510 Atherosclerotic heart disease of native coronary artery without angina pectoris: Secondary | ICD-10-CM | POA: Diagnosis not present

## 2015-09-21 DIAGNOSIS — E1122 Type 2 diabetes mellitus with diabetic chronic kidney disease: Secondary | ICD-10-CM | POA: Diagnosis not present

## 2015-09-21 DIAGNOSIS — N186 End stage renal disease: Secondary | ICD-10-CM | POA: Insufficient documentation

## 2015-09-21 DIAGNOSIS — N2581 Secondary hyperparathyroidism of renal origin: Secondary | ICD-10-CM | POA: Diagnosis not present

## 2015-09-21 DIAGNOSIS — I120 Hypertensive chronic kidney disease with stage 5 chronic kidney disease or end stage renal disease: Secondary | ICD-10-CM | POA: Insufficient documentation

## 2015-09-21 HISTORY — DX: Dependence on renal dialysis: Z99.2

## 2015-09-21 LAB — CBC
HCT: 34.8 % — ABNORMAL LOW (ref 35.0–47.0)
HEMOGLOBIN: 11.2 g/dL — AB (ref 12.0–16.0)
MCH: 26.2 pg (ref 26.0–34.0)
MCHC: 32.4 g/dL (ref 32.0–36.0)
MCV: 81 fL (ref 80.0–100.0)
Platelets: 177 10*3/uL (ref 150–440)
RBC: 4.29 MIL/uL (ref 3.80–5.20)
RDW: 15.1 % — ABNORMAL HIGH (ref 11.5–14.5)
WBC: 7.4 10*3/uL (ref 3.6–11.0)

## 2015-09-21 LAB — BASIC METABOLIC PANEL
Anion gap: 9 (ref 5–15)
BUN: 15 mg/dL (ref 6–20)
CHLORIDE: 96 mmol/L — AB (ref 101–111)
CO2: 31 mmol/L (ref 22–32)
Calcium: 8.6 mg/dL — ABNORMAL LOW (ref 8.9–10.3)
Creatinine, Ser: 5.02 mg/dL — ABNORMAL HIGH (ref 0.44–1.00)
GFR calc Af Amer: 9 mL/min — ABNORMAL LOW (ref >60)
GFR calc non Af Amer: 8 mL/min — ABNORMAL LOW (ref >60)
GLUCOSE: 121 mg/dL — AB (ref 65–99)
POTASSIUM: 3.3 mmol/L — AB (ref 3.5–5.1)
Sodium: 136 mmol/L (ref 135–145)

## 2015-09-21 NOTE — Pre-Procedure Instructions (Signed)
ANESTHESIA - ECG NARRATIVE, CARDIAC CLEARANCE NOTE IS ON CHART  ECG 12 Lead - Final result (08/11/2015 2:54 PM) ECG 12 Lead - Final result (08/11/2015 2:54 PM)  Component Value Range  EKG Systolic BP  mmHg  EKG Diastolic BP  mmHg  EKG Ventricular Rate 86 BPM  EKG Atrial Rate 86 BPM  EKG P-R Interval 178 ms  EKG QRS Duration 126 ms  EKG Q-T Interval 426 ms  EKG QTC Calculation 509 ms  EKG Calculated P Axis 59 degrees  EKG Calculated R Axis 107 degrees  EKG Calculated T Axis 42 degrees   ECG 12 Lead - Final result (08/11/2015 2:54 PM)  Narrative  SINUS RHYTHM WITH OCCASIONAL PREMATURE VENTRICULAR BEATS  RIGHT BUNDLE BRANCH BLOCK  ABNORMAL ECG  WHEN COMPARED WITH ECG OF 16-Feb-2014 07:08,  NO SIGNIFICANT CHANGE WAS FOUND  Confirmed by NICHOLS MD, TIM ([id number]) on 09/09/2015 7:08:15 AM

## 2015-09-21 NOTE — Patient Instructions (Signed)
Your procedure is scheduled on: Wednesday 09/27/15 Report to Day Surgery. 2ND FLOOR MEDCIAL MALL ENTRACNE To find out your arrival time please call [phone] between 1PM - 3PM on Tuesday 09/26/15.  Remember: Instructions that are not followed completely may result in serious medical risk, up to and including death, or upon the discretion of your surgeon and anesthesiologist your surgery may need to be rescheduled.    __X__ 1. Do not eat food or drink liquids after midnight. No gum chewing or hard candies.     __X__ 2. No Alcohol for 24 hours before or after surgery.   ____ 3. Bring all medications with you on the day of surgery if instructed.    __X__ 4. Notify your doctor if there is any change in your medical condition     (cold, fever, infections).     Do not wear jewelry, make-up, hairpins, clips or nail polish.  Do not wear lotions, powders, or perfumes.   Do not shave 48 hours prior to surgery. Men may shave face and neck.  Do not bring valuables to the hospital.    [facility] is not responsible for any belongings or valuables.               Contacts, dentures or bridgework may not be worn into surgery.  Leave your suitcase in the car. After surgery it may be brought to your room.  For patients admitted to the hospital, discharge time is determined by your                treatment team.   Patients discharged the day of surgery will not be allowed to drive home.   Please read over the following fact sheets that you were given:   Surgical Site Infection Prevention   __X__ Take these medicines the morning of surgery with A SIP OF WATER:    1. ISOSORBIDE  2. METOPROLOL  3. PRAVASTATIN  4.  5.  6.  ____ Fleet Enema (as directed)   __X__ Use CHG Soap as directed  ____ Use inhalers on the day of surgery  ____ Stop metformin 2 days prior to surgery    ____ Take 1/2 of usual insulin dose the night before surgery and none on the morning of surgery.   ____ Stop  Coumadin/Plavix/aspirin on CONTINUE ASPIRIN AS DIRECTED BY YOUR CARDIOLOGIST  ____ Stop Anti-inflammatories on    ____ Stop supplements until after surgery.    ____ Bring C-Pap to the hospital.

## 2015-09-23 DIAGNOSIS — E119 Type 2 diabetes mellitus without complications: Secondary | ICD-10-CM | POA: Diagnosis not present

## 2015-09-23 DIAGNOSIS — N186 End stage renal disease: Secondary | ICD-10-CM | POA: Diagnosis not present

## 2015-09-23 DIAGNOSIS — N2581 Secondary hyperparathyroidism of renal origin: Secondary | ICD-10-CM | POA: Diagnosis not present

## 2015-09-23 DIAGNOSIS — D631 Anemia in chronic kidney disease: Secondary | ICD-10-CM | POA: Diagnosis not present

## 2015-09-26 ENCOUNTER — Other Ambulatory Visit: Payer: Self-pay

## 2015-09-26 ENCOUNTER — Emergency Department: Payer: Medicare Other

## 2015-09-26 ENCOUNTER — Emergency Department
Admission: EM | Admit: 2015-09-26 | Discharge: 2015-09-26 | Disposition: A | Discharge status: Home / Self Care | Payer: Medicare Other | Attending: Emergency Medicine | Admitting: Emergency Medicine

## 2015-09-26 DIAGNOSIS — Z79899 Other long term (current) drug therapy: Secondary | ICD-10-CM | POA: Insufficient documentation

## 2015-09-26 DIAGNOSIS — E1122 Type 2 diabetes mellitus with diabetic chronic kidney disease: Secondary | ICD-10-CM | POA: Insufficient documentation

## 2015-09-26 DIAGNOSIS — R0600 Dyspnea, unspecified: Secondary | ICD-10-CM | POA: Insufficient documentation

## 2015-09-26 DIAGNOSIS — R05 Cough: Secondary | ICD-10-CM | POA: Diagnosis not present

## 2015-09-26 DIAGNOSIS — D631 Anemia in chronic kidney disease: Secondary | ICD-10-CM | POA: Diagnosis not present

## 2015-09-26 DIAGNOSIS — Z992 Dependence on renal dialysis: Secondary | ICD-10-CM | POA: Insufficient documentation

## 2015-09-26 DIAGNOSIS — N186 End stage renal disease: Secondary | ICD-10-CM | POA: Diagnosis not present

## 2015-09-26 DIAGNOSIS — J40 Bronchitis, not specified as acute or chronic: Secondary | ICD-10-CM | POA: Diagnosis not present

## 2015-09-26 DIAGNOSIS — N2581 Secondary hyperparathyroidism of renal origin: Secondary | ICD-10-CM | POA: Diagnosis not present

## 2015-09-26 DIAGNOSIS — I129 Hypertensive chronic kidney disease with stage 1 through stage 4 chronic kidney disease, or unspecified chronic kidney disease: Secondary | ICD-10-CM | POA: Diagnosis not present

## 2015-09-26 DIAGNOSIS — I2510 Atherosclerotic heart disease of native coronary artery without angina pectoris: Secondary | ICD-10-CM | POA: Insufficient documentation

## 2015-09-26 DIAGNOSIS — Z951 Presence of aortocoronary bypass graft: Secondary | ICD-10-CM | POA: Insufficient documentation

## 2015-09-26 DIAGNOSIS — Z7982 Long term (current) use of aspirin: Secondary | ICD-10-CM | POA: Diagnosis not present

## 2015-09-26 DIAGNOSIS — N189 Chronic kidney disease, unspecified: Secondary | ICD-10-CM | POA: Diagnosis not present

## 2015-09-26 DIAGNOSIS — R0602 Shortness of breath: Secondary | ICD-10-CM | POA: Diagnosis not present

## 2015-09-26 DIAGNOSIS — E119 Type 2 diabetes mellitus without complications: Secondary | ICD-10-CM | POA: Diagnosis not present

## 2015-09-26 LAB — COMPREHENSIVE METABOLIC PANEL
ALBUMIN: 3.7 g/dL (ref 3.5–5.0)
ALK PHOS: 56 U/L (ref 38–126)
ALT: 17 U/L (ref 14–54)
AST: 21 U/L (ref 15–41)
Anion gap: 7 (ref 5–15)
BILIRUBIN TOTAL: 0.5 mg/dL (ref 0.3–1.2)
BUN: 28 mg/dL — AB (ref 6–20)
CALCIUM: 8.5 mg/dL — AB (ref 8.9–10.3)
CO2: 32 mmol/L (ref 22–32)
Chloride: 99 mmol/L — ABNORMAL LOW (ref 101–111)
Creatinine, Ser: 7.13 mg/dL — ABNORMAL HIGH (ref 0.44–1.00)
GFR calc Af Amer: 6 mL/min — ABNORMAL LOW (ref >60)
GFR calc non Af Amer: 5 mL/min — ABNORMAL LOW (ref >60)
GLUCOSE: 105 mg/dL — AB (ref 65–99)
Potassium: 4.8 mmol/L (ref 3.5–5.1)
Sodium: 138 mmol/L (ref 135–145)
TOTAL PROTEIN: 7.1 g/dL (ref 6.5–8.1)

## 2015-09-26 LAB — CBC
HEMATOCRIT: 31.5 % — AB (ref 35.0–47.0)
HEMOGLOBIN: 10.2 g/dL — AB (ref 12.0–16.0)
MCH: 26.2 pg (ref 26.0–34.0)
MCHC: 32.4 g/dL (ref 32.0–36.0)
MCV: 81.1 fL (ref 80.0–100.0)
Platelets: 135 10*3/uL — ABNORMAL LOW (ref 150–440)
RBC: 3.88 MIL/uL (ref 3.80–5.20)
RDW: 14.9 % — ABNORMAL HIGH (ref 11.5–14.5)
WBC: 8.3 10*3/uL (ref 3.6–11.0)

## 2015-09-26 LAB — TROPONIN I: Troponin I: <0.03 ng/mL (ref <0.031)

## 2015-09-26 MED ORDER — AZITHROMYCIN 250 MG PO TABS
ORAL_TABLET | ORAL | Status: AC
Start: 2015-09-26 — End: 2015-10-01

## 2015-09-26 NOTE — ED Notes (Signed)
Pt reports to ED w/ c/o SOB that began after dialysis.  Sts that she "can't get a good breath".  Pt has productive cough in triage, sts that it has been going off/on for 3 months Pt sts that she finished dialysis.  Pt A/Ox4.  100% RA.

## 2015-09-26 NOTE — ED Provider Notes (Signed)
[facility] Emergency Department Provider Note  Time seen: 3:15 PM  I have reviewed the triage vital signs and the nursing notes.   HISTORY  Chief Complaint Shortness of Breath and Cough    HPI Kelli Nguyen is a 70 y.o. female with a past medical history of hypertension, diabetes, gastric reflux, end-stage renal disease on hemodialysis Tuesday, Thursday, Saturday who presents the emergency department with shortness of breath. According to the patient while receiving her hemodialysis run 8:00 this morning she experienced shortness of breath. She states it was not bad enough to stop and she completed her session of dialysis. Denies any chest pain at any point. Denies nausea or diaphoresis. Patient states she came to the emergency department because of the episode of shortness of breath, the feeling that she cannot get a full breath. She states upon arrival to the emergency department with shortness of breath had resolved and she is asymptomatic. Denies any complaints at this time besides a cough that she has been experiencing for 3 months. Describes as a dry cough, no sputum production, no fever. Denies increased leg swelling or pain.     Past Medical History  Diagnosis Date  . Anemia   . Hypertension   . Diabetes mellitus without complication ([place])   . Chronic kidney disease   . GERD (gastroesophageal reflux disease)   . Coronary artery disease   . Chronic cough   . Dialysis patient [facility])     T-TH-SAT    There are no active problems to display for this patient.   Past Surgical History  Procedure Laterality Date  . Abdominal hysterectomy    . Coronary artery bypass graft    . Colonoscopy    . Renal shunts    . Colonoscopy with propofol N/A 11/28/2014    Procedure: COLONOSCOPY WITH PROPOFOL;  Surgeon: Manya Silvas, MD;  Location: [facility] ENDOSCOPY;  Service: Endoscopy;  Laterality: N/A;  . Peripheral vascular catheterization Left 01/18/2015   Procedure: A/V Shuntogram/Fistulagram;  Surgeon: Katha Cabal, MD;  Location: [place] CV LAB;  Service: Cardiovascular;  Laterality: Left;  . Peripheral vascular catheterization Left 01/18/2015    Procedure: A/V Shunt Intervention;  Surgeon: Katha Cabal, MD;  Location: [place] CV LAB;  Service: Cardiovascular;  Laterality: Left;  . Peripheral vascular catheterization N/A 08/10/2015    Procedure: Thrombectomy;  Surgeon: Algernon Huxley, MD;  Location: [place] CV LAB;  Service: Cardiovascular;  Laterality: N/A;  . Peripheral vascular catheterization N/A 08/10/2015    Procedure: A/V Shuntogram/Fistulagram;  Surgeon: Algernon Huxley, MD;  Location: [place] CV LAB;  Service: Cardiovascular;  Laterality: N/A;  . Peripheral vascular catheterization N/A 08/10/2015    Procedure: A/V Shunt Intervention;  Surgeon: Algernon Huxley, MD;  Location: [place] CV LAB;  Service: Cardiovascular;  Laterality: N/A;    Current Outpatient Rx  Name  Route  Sig  Dispense  Refill  . aspirin 81 MG tablet   Oral   Take 81 mg by mouth daily.         . cinacalcet (SENSIPAR) 30 MG tablet   Oral   Take 30 mg by mouth daily.         . isosorbide mononitrate (IMDUR) 30 MG 24 hr tablet   Oral   Take 30 mg by mouth daily.         . metoprolol succinate (TOPROL-XL) 25 MG 24 hr tablet   Oral   Take 25 mg by mouth  daily.         . pravastatin (PRAVACHOL) 20 MG tablet   Oral   Take 20 mg by mouth daily.         . sevelamer carbonate (RENVELA) 800 MG tablet   Oral   Take 800 mg by mouth 3 (three) times daily with meals.           Allergies Codeine sulfate; Gadolinium derivatives; and Penicillins  No family history on file.  Social History Social History  Substance Use Topics  . Smoking status: Never Smoker   . Smokeless tobacco: Never Used  . Alcohol Use: No    Review of Systems Constitutional: Negative for fever. Cardiovascular: Negative for chest pain. Respiratory:  Positive for shortness of breath now resolved. Gastrointestinal: Negative for abdominal pain Neurological: Negative for headache 10-point ROS otherwise negative.  ____________________________________________   PHYSICAL EXAM:  VITAL SIGNS: ED Triage Vitals  Enc Vitals Group     BP 09/26/15 1323 117/59 mmHg     Pulse Rate 09/26/15 1323 69     Resp 09/26/15 1323 20     Temp 09/26/15 1323 98.4 F (36.9 C)     Temp Source 09/26/15 1323 Oral     SpO2 09/26/15 1323 100 %     Weight 09/26/15 1323 185 lb (83.915 kg)     Height 09/26/15 1323 4\' 11"  (1.499 m)     Head Cir --      Peak Flow --      Pain Score 09/26/15 1324 9     Pain Loc --      Pain Edu? --      Excl. in [place]? --     Constitutional: Alert and oriented. Well appearing and in no distress. Eyes: Normal exam ENT   Head: Normocephalic and atraumatic.   Mouth/Throat: Mucous membranes are moist. Cardiovascular: Normal rate, regular rhythm. No murmur Respiratory: Normal respiratory effort without tachypnea nor retractions. Breath sounds are clear  Gastrointestinal: Soft and nontender. No distention.  Musculoskeletal: Nontender with normal range of motion in all extremities. No lower extremity tenderness. Mild pedal edema.  Neurologic:  Normal speech and language. No gross focal neurologic deficits  Skin:  Skin is warm, dry and intact.  Psychiatric: Mood and affect are normal.  ____________________________________________    EKG  EKG reviewed and interpreted by myself shows normal sinus rhythm at 76 bpm, widened QRS, normal axis, RSR pattern most consistent with a right bundle branch block with nonspecific but no concerning ST changes.  ____________________________________________    RADIOLOGY  Chest x-ray negative  INITIAL IMPRESSION / ASSESSMENT AND PLAN / ED COURSE  Pertinent labs & imaging results that were available during my care of the patient were reviewed by me and considered in my medical decision  making (see chart for details).  Patient presents the emergency department with shortness of breath onset during hemodialysis. Patient did receive her entire dialysis treatment and now states she is symptom-free. Denies any chest pain at any point. States she has been having a dry cough for the past 2-3 months but denies sputum production, congestion or fever. Patient has 100% room air oxygen saturation. Chest x-ray is clear. We'll check labs and monitor the patient closely in the emergency department. The patient does have an occasional dry cough.  Labs are within normal limits. Patient remains asymptomatic. Troponin negative. We'll discharge home with a prescription for a Z-Pak. Patient is hemodialysis dependent, no dosage adjustment required. Patient will follow up with her primary  care physician.  ____________________________________________   FINAL CLINICAL IMPRESSION(S) / ED DIAGNOSES  Cough  dyspnea  Harvest Dark, MD 09/26/15 1659

## 2015-09-26 NOTE — Discharge Instructions (Signed)
Shortness of Breath Shortness of breath means you have trouble breathing. Shortness of breath needs medical care right away. HOME CARE   Do not smoke.  Avoid being around chemicals or things (paint fumes, dust) that may bother your breathing.  Rest as needed. Slowly begin your normal activities.  Only take medicines as told by your doctor.  Keep all doctor visits as told. GET HELP RIGHT AWAY IF:   Your shortness of breath gets worse.  You feel lightheaded, pass out (faint), or have a cough that is not helped by medicine.  You cough up blood.  You have pain with breathing.  You have pain in your chest, arms, shoulders, or belly (abdomen).  You have a fever.  You cannot walk up stairs or exercise the way you normally do.  You do not get better in the time expected.  You have a hard time doing normal activities even with rest.  You have problems with your medicines.  You have any new symptoms. MAKE SURE YOU:  Understand these instructions.  Will watch your condition.  Will get help right away if you are not doing well or get worse.   This information is not intended to replace advice given to you by your health care provider. Make sure you discuss any questions you have with your health care provider.   Document Released: 10/09/2007 Document Revised: 04/27/2013 Document Reviewed: 07/08/2011 Elsevier Interactive Patient Education 2016 Elsevier Inc.  Upper Respiratory Infection, Adult Most upper respiratory infections (URIs) are caused by a virus. A URI affects the nose, throat, and upper air passages. The most common type of URI is often called "the common cold." HOME CARE   Take medicines only as told by your doctor.  Gargle warm saltwater or take cough drops to comfort your throat as told by your doctor.  Use a warm mist humidifier or inhale steam from a shower to increase air moisture. This may make it easier to breathe.  Drink enough fluid to keep your pee  (urine) clear or pale yellow.  Eat soups and other clear broths.  Have a healthy diet.  Rest as needed.  Go back to work when your fever is gone or your doctor says it is okay.  You may need to stay home longer to avoid giving your URI to others.  You can also wear a face mask and wash your hands often to prevent spread of the virus.  Use your inhaler more if you have asthma.  Do not use any tobacco products, including cigarettes, chewing tobacco, or electronic cigarettes. If you need help quitting, ask your doctor. GET HELP IF:  You are getting worse, not better.  Your symptoms are not helped by medicine.  You have chills.  You are getting more short of breath.  You have brown or red mucus.  You have yellow or brown discharge from your nose.  You have pain in your face, especially when you bend forward.  You have a fever.  You have puffy (swollen) neck glands.  You have pain while swallowing.  You have white areas in the back of your throat. GET HELP RIGHT AWAY IF:   You have very bad or constant:  Headache.  Ear pain.  Pain in your forehead, behind your eyes, and over your cheekbones (sinus pain).  Chest pain.  You have long-lasting (chronic) lung disease and any of the following:  Wheezing.  Long-lasting cough.  Coughing up blood.  A change in your usual mucus.  You have a stiff neck.  You have changes in your:  Vision.  Hearing.  Thinking.  Mood. MAKE SURE YOU:   Understand these instructions.  Will watch your condition.  Will get help right away if you are not doing well or get worse.   This information is not intended to replace advice given to you by your health care provider. Make sure you discuss any questions you have with your health care provider.   Document Released: 10/09/2007 Document Revised: 09/06/2014 Document Reviewed: 07/28/2013 Elsevier Interactive Patient Education [employer].

## 2015-09-27 ENCOUNTER — Ambulatory Visit
Admission: RE | Admit: 2015-09-27 | Discharge: 2015-09-27 | Disposition: A | Discharge status: Home / Self Care | Payer: Medicare Other | Source: Ambulatory Visit | Attending: Unknown Physician Specialty | Admitting: Unknown Physician Specialty

## 2015-09-27 ENCOUNTER — Encounter
Admission: RE | Disposition: A | Discharge status: Home / Self Care | Payer: Self-pay | Source: Ambulatory Visit | Attending: Unknown Physician Specialty

## 2015-09-27 ENCOUNTER — Encounter: Payer: Self-pay | Admitting: Anesthesiology

## 2015-09-27 ENCOUNTER — Ambulatory Visit: Payer: Medicare Other | Admitting: Anesthesiology

## 2015-09-27 DIAGNOSIS — N189 Chronic kidney disease, unspecified: Secondary | ICD-10-CM | POA: Insufficient documentation

## 2015-09-27 DIAGNOSIS — Z7982 Long term (current) use of aspirin: Secondary | ICD-10-CM | POA: Insufficient documentation

## 2015-09-27 DIAGNOSIS — E1122 Type 2 diabetes mellitus with diabetic chronic kidney disease: Secondary | ICD-10-CM | POA: Diagnosis not present

## 2015-09-27 DIAGNOSIS — Z888 Allergy status to other drugs, medicaments and biological substances status: Secondary | ICD-10-CM | POA: Diagnosis not present

## 2015-09-27 DIAGNOSIS — Z885 Allergy status to narcotic agent status: Secondary | ICD-10-CM | POA: Insufficient documentation

## 2015-09-27 DIAGNOSIS — Z91041 Radiographic dye allergy status: Secondary | ICD-10-CM | POA: Insufficient documentation

## 2015-09-27 DIAGNOSIS — G5602 Carpal tunnel syndrome, left upper limb: Secondary | ICD-10-CM | POA: Diagnosis not present

## 2015-09-27 DIAGNOSIS — Z880 Allergy status to penicillin: Secondary | ICD-10-CM | POA: Insufficient documentation

## 2015-09-27 DIAGNOSIS — K219 Gastro-esophageal reflux disease without esophagitis: Secondary | ICD-10-CM | POA: Diagnosis not present

## 2015-09-27 DIAGNOSIS — Z79899 Other long term (current) drug therapy: Secondary | ICD-10-CM | POA: Insufficient documentation

## 2015-09-27 DIAGNOSIS — I129 Hypertensive chronic kidney disease with stage 1 through stage 4 chronic kidney disease, or unspecified chronic kidney disease: Secondary | ICD-10-CM | POA: Insufficient documentation

## 2015-09-27 DIAGNOSIS — G5603 Carpal tunnel syndrome, bilateral upper limbs: Secondary | ICD-10-CM | POA: Insufficient documentation

## 2015-09-27 DIAGNOSIS — I2510 Atherosclerotic heart disease of native coronary artery without angina pectoris: Secondary | ICD-10-CM | POA: Insufficient documentation

## 2015-09-27 HISTORY — PX: CARPAL TUNNEL RELEASE: SHX101

## 2015-09-27 LAB — POCT I-STAT 4, (NA,K, GLUC, HGB,HCT)
GLUCOSE: 102 mg/dL — AB (ref 65–99)
HEMATOCRIT: 39 % (ref 36.0–46.0)
HEMOGLOBIN: 13.3 g/dL (ref 12.0–15.0)
Potassium: 4.9 mmol/L (ref 3.5–5.1)
Sodium: 137 mmol/L (ref 135–145)

## 2015-09-27 LAB — GLUCOSE, CAPILLARY
GLUCOSE-CAPILLARY: 95 mg/dL (ref 65–99)
Glucose-Capillary: 98 mg/dL (ref 65–99)

## 2015-09-27 SURGERY — CARPAL TUNNEL RELEASE
Anesthesia: General | Site: Hand | Laterality: Left | Wound class: Clean

## 2015-09-27 MED ORDER — LIDOCAINE HCL (PF) 1 % IJ SOLN
INTRAMUSCULAR | Status: AC
  Filled 2015-09-27: qty 2

## 2015-09-27 MED ORDER — PHENYLEPHRINE HCL 10 MG/ML IJ SOLN
INTRAMUSCULAR | Status: DC | PRN
  Administered 2015-09-27 (×4): 100 ug via INTRAVENOUS

## 2015-09-27 MED ORDER — PROPOFOL 10 MG/ML IV BOLUS
INTRAVENOUS | Status: DC | PRN
  Administered 2015-09-27: 150 mg via INTRAVENOUS

## 2015-09-27 MED ORDER — OXYCODONE HCL 5 MG/5ML PO SOLN: 5.0000 mg | Freq: Once | ORAL | Status: DC | PRN

## 2015-09-27 MED ORDER — LIDOCAINE HCL (CARDIAC) 20 MG/ML IV SOLN
INTRAVENOUS | Status: DC | PRN
  Administered 2015-09-27: 30 mg via INTRAVENOUS

## 2015-09-27 MED ORDER — MIDAZOLAM HCL 2 MG/2ML IJ SOLN
INTRAMUSCULAR | Status: DC | PRN
  Administered 2015-09-27: 0.5 mg via INTRAVENOUS

## 2015-09-27 MED ORDER — FAMOTIDINE 20 MG PO TABS
ORAL_TABLET | ORAL | Status: AC
  Administered 2015-09-27: 20 mg
  Filled 2015-09-27: qty 1

## 2015-09-27 MED ORDER — FENTANYL CITRATE (PF) 100 MCG/2ML IJ SOLN: 25.0000 ug | INTRAMUSCULAR | Status: DC | PRN

## 2015-09-27 MED ORDER — BUPIVACAINE HCL (PF) 0.5 % IJ SOLN
INTRAMUSCULAR | Status: AC
  Filled 2015-09-27: qty 30

## 2015-09-27 MED ORDER — BUPIVACAINE HCL (PF) 0.5 % IJ SOLN
INTRAMUSCULAR | Status: DC | PRN
  Administered 2015-09-27: 5 mL

## 2015-09-27 MED ORDER — ONDANSETRON HCL 4 MG/2ML IJ SOLN
INTRAMUSCULAR | Status: DC | PRN
  Administered 2015-09-27: 4 mg via INTRAVENOUS

## 2015-09-27 MED ORDER — SODIUM CHLORIDE 0.9 % IV SOLN
INTRAVENOUS | Status: DC
  Administered 2015-09-27 (×2): via INTRAVENOUS

## 2015-09-27 MED ORDER — OXYCODONE HCL 5 MG PO TABS: 5.0000 mg | ORAL_TABLET | Freq: Once | ORAL | Status: DC | PRN

## 2015-09-27 MED ORDER — FENTANYL CITRATE (PF) 100 MCG/2ML IJ SOLN
INTRAMUSCULAR | Status: DC | PRN
  Administered 2015-09-27: 25 ug via INTRAVENOUS

## 2015-09-27 MED ORDER — LIDOCAINE HCL 2 % EX GEL
CUTANEOUS | Status: DC | PRN
  Administered 2015-09-27: 1 via TOPICAL

## 2015-09-27 SURGICAL SUPPLY — 25 items
BANDAGE ELASTIC 2 LF NS (GAUZE/BANDAGES/DRESSINGS) ×3 IMPLANT
BNDG ESMARK 4X12 TAN STRL LF (GAUZE/BANDAGES/DRESSINGS) ×3 IMPLANT
CHLORAPREP W/TINT 26ML (MISCELLANEOUS) ×3 IMPLANT
CUFF TOURN 18 STER (MISCELLANEOUS) ×3 IMPLANT
ELECT REM PT RETURN 9FT ADLT (ELECTROSURGICAL) ×3
ELECTRODE REM PT RTRN 9FT ADLT (ELECTROSURGICAL) ×1 IMPLANT
GAUZE SPONGE 4X4 12PLY STRL (GAUZE/BANDAGES/DRESSINGS) ×3 IMPLANT
GLOVE BIO SURGEON STRL SZ7.5 (GLOVE) ×3 IMPLANT
GLOVE BIO SURGEON STRL SZ8 (GLOVE) ×6 IMPLANT
GLOVE BIOGEL M STRL SZ7.5 (GLOVE) ×3 IMPLANT
GLOVE INDICATOR 8.0 STRL GRN (GLOVE) ×3 IMPLANT
GOWN STRL REUS W/ TWL LRG LVL3 (GOWN DISPOSABLE) ×2 IMPLANT
GOWN STRL REUS W/TWL LRG LVL3 (GOWN DISPOSABLE) ×4
KIT RM TURNOVER STRD PROC AR (KITS) ×3 IMPLANT
NS IRRIG 500ML POUR BTL (IV SOLUTION) ×3 IMPLANT
PACK EXTREMITY ARMC (MISCELLANEOUS) ×3 IMPLANT
PADDING CAST 2X4YD ST (MISCELLANEOUS) ×2
PADDING CAST BLEND 2X4 STRL (MISCELLANEOUS) ×1 IMPLANT
SOL PREP PVP 2OZ (MISCELLANEOUS) ×3
SOLUTION PREP PVP 2OZ (MISCELLANEOUS) ×1 IMPLANT
SPLINT CAST 1 STEP 3X12 (MISCELLANEOUS) ×3 IMPLANT
STOCKINETTE STRL 4IN 9604848 (GAUZE/BANDAGES/DRESSINGS) ×3 IMPLANT
SUT ETHILON 4-0 (SUTURE) ×2
SUT ETHILON 4-0 FS2 18XMFL BLK (SUTURE) ×1
SUTURE ETHLN 4-0 FS2 18XMF BLK (SUTURE) ×1 IMPLANT

## 2015-09-27 NOTE — Anesthesia Procedure Notes (Signed)
Procedure Name: LMA Insertion Date/Time: 09/27/2015 9:25 AM Performed by: Courtney Paris Pre-anesthesia Checklist: Patient identified, Emergency Drugs available, Suction available, Patient being monitored and Timeout performed Patient Re-evaluated:Patient Re-evaluated prior to inductionOxygen Delivery Method: Circle system utilized Preoxygenation: Pre-oxygenation with 100% oxygen Intubation Type: Combination inhalational/ intravenous induction LMA: LMA inserted LMA Size: 3.0 Grade View: Grade II Tube type: Oral Number of attempts: 1 Placement Confirmation: positive ETCO2,  CO2 detector and breath sounds checked- equal and bilateral Tube secured with: Tape Dental Injury: Teeth and Oropharynx as per pre-operative assessment

## 2015-09-27 NOTE — Anesthesia Preprocedure Evaluation (Signed)
Anesthesia Evaluation  Patient identified by MRN, date of birth, ID band Patient awake    Reviewed: Allergy & Precautions, H&P , NPO status , Patient's Chart, lab work & pertinent test results  History of Anesthesia Complications Negative for: history of anesthetic complications  Airway Mallampati: III  TM Distance: >3 FB Neck ROM: limited    Dental  (+) Poor Dentition, Chipped, Missing   Pulmonary neg shortness of breath, Recent URI ,    Pulmonary exam normal breath sounds clear to auscultation       Cardiovascular Exercise Tolerance: Poor hypertension, (-) angina+ CAD  (-) DOE Normal cardiovascular exam Rhythm:regular Rate:Normal     Neuro/Psych negative neurological ROS  negative psych ROS   GI/Hepatic Neg liver ROS, GERD  Controlled,  Endo/Other  diabetes, Type 2  Renal/GU Dialysis and ESRFRenal disease  negative genitourinary   Musculoskeletal   Abdominal   Peds  Hematology negative hematology ROS (+)   Anesthesia Other Findings Past Medical History:   Anemia                                                       Hypertension                                                 Diabetes mellitus without complication ([facility])                 Chronic kidney disease                                       GERD (gastroesophageal reflux disease)                       Coronary artery disease                                      Chronic cough                                                Dialysis patient ([place])                                         Comment:T-TH-SAT  Past Surgical History:   ABDOMINAL HYSTERECTOMY                                        CORONARY ARTERY BYPASS GRAFT                                  COLONOSCOPY  RENAL SHUNTS                                                  COLONOSCOPY WITH PROPOFOL                       N/A 11/28/2014   Comment:Procedure: COLONOSCOPY WITH PROPOFOL;  Surgeon:              Manya Silvas, MD;  Location: [facility]               ENDOSCOPY;  Service: Endoscopy;  Laterality:               N/A;   PERIPHERAL VASCULAR CATHETERIZATION             Left 01/18/2015      Comment:Procedure: A/V Shuntogram/Fistulagram;                Surgeon: Katha Cabal, MD;  Location: [place] CV LAB;  Service: Cardiovascular;                Laterality: Left;   PERIPHERAL VASCULAR CATHETERIZATION             Left 01/18/2015      Comment:Procedure: A/V Shunt Intervention;  Surgeon:               Katha Cabal, MD;  Location: [place]              CV LAB;  Service: Cardiovascular;  Laterality:               Left;   PERIPHERAL VASCULAR CATHETERIZATION             N/A 08/10/2015       Comment:Procedure: Thrombectomy;  Surgeon: Algernon Huxley,              MD;  Location: [place] CV LAB;  Service:               Cardiovascular;  Laterality: N/A;   PERIPHERAL VASCULAR CATHETERIZATION             N/A 08/10/2015       Comment:Procedure: A/V Shuntogram/Fistulagram;                Surgeon: Algernon Huxley, MD;  Location: [place] CV LAB;  Service: Cardiovascular;                Laterality: N/A;   PERIPHERAL VASCULAR CATHETERIZATION             N/A 08/10/2015       Comment:Procedure: A/V Shunt Intervention;  Surgeon:               Algernon Huxley, MD;  Location: [place] CV               LAB;  Service: Cardiovascular;  Laterality:               N/A;  BMI    Body Mass Index   37.34 kg/m 2      Reproductive/Obstetrics negative OB ROS  Anesthesia Physical Anesthesia Plan  ASA: III  Anesthesia Plan: General LMA   Post-op Pain Management:    Induction:   Airway Management Planned:   Additional Equipment:   Intra-op Plan:   Post-operative Plan:   Informed Consent: I have reviewed the patients History and Physical,  chart, labs and discussed the procedure including the risks, benefits and alternatives for the proposed anesthesia with the patient or authorized representative who has indicated his/her understanding and acceptance.   Dental Advisory Given  Plan Discussed with: Anesthesiologist, CRNA and Surgeon  Anesthesia Plan Comments:         Anesthesia Quick Evaluation

## 2015-09-27 NOTE — Op Note (Signed)
DATE OF SURGERY:  09/27/2015  PATIENT NAME:  Kelli Nguyen   DOB: June 01, 1945  MRN: [id number]  PRE-OPERATIVE DIAGNOSIS: Left carpal tunnel syndrome  POST-OPERATIVE DIAGNOSIS:  Same  PROCEDURE: Left carpal tunnel release  SURGEON: Dr. Leanor Kail, Brooke Bonito. M.D.  ANESTHESIA: Gen.   INDICATIONS FOR SURGERY: Kelli Nguyen is a 70 y.o. year old female with a long history of numbness and paresthesias in the left hand. Nerve conduction studies demonstrated findings consistent with moderately severe  median nerve compression.The patient had not seen any significant improvement despite conservative nonsurgical intervention. After discussion of the risks and benefits of surgical intervention, the patient expressed understanding of the risks benefits and agreed with plans for carpal tunnel release.   PROCEDURE IN DETAIL: The patient was taken the operating room where satisfactory general anesthesia was achieved. A tourniquet was placed on the patient's left upper forearm.The left hand and arm were prepped  and draped in the usual sterile fashion. A "time-out" was performed as per usual protocol. The hand and forearm were exsanguinated using an Esmarch and the tourniquet was inflated to 250 mmHg.  An incision was made just ulnar to the thenar palmar crease. Dissection was carried down through the palmar fascia to the transverse carpal ligament. The transverse carpal ligament was sharply incised, taking care to protect the underlying structures within the carpal tunnel. Complete release of the transverse carpal ligament was achieved. There was no evidence of a mass or proliferative synovitis within the carpal tunnel. The median nerve was noticed to be somewhat flattened in the area immediately underneath the transverse carpal tunnel ligament. The wound was irrigated with saline. The tourniquet was released at this time. It had been up for about 10 minutes. Bleeding was controlled with digital pressure and  coagulation cautery. I did inject the subcutaneous tissue of the wound with about 5 cc of 0.5% Marcaine without epinephrine. The skin was then re-approximated with interrupted sutures of #4-0 nylon. A sterile dressing was applied followed by application of a volar splint.  The patient was awakened and transferred to her stretcher bed.  The patient tolerated the procedure well and was transported to the PACU in stable condition. Blood loss was negligible.  Dr. Mariel Kansky. M.D.

## 2015-09-27 NOTE — Transfer of Care (Signed)
Immediate Anesthesia Transfer of Care Note  Patient: Kelli Nguyen  Procedure(s) Performed: Procedure(s): CARPAL TUNNEL RELEASE (Left)  Patient Location: PACU  Anesthesia Type:General  Level of Consciousness: awake, oriented and patient cooperative  Airway & Oxygen Therapy: Patient Spontanous Breathing and Patient connected to face mask oxygen  Post-op Assessment: Report given to RN and Post -op Vital signs reviewed and stable  Post vital signs: Reviewed and stable  Last Vitals:  Filed Vitals:   09/27/15 0838  BP: 137/63  Pulse: 67  Temp: 35.8 C  Resp: 16    Last Pain: There were no vitals filed for this visit.       Complications: No apparent anesthesia complications

## 2015-09-27 NOTE — Anesthesia Postprocedure Evaluation (Signed)
Anesthesia Post Note  Patient: Kelli Nguyen  Procedure(s) Performed: Procedure(s) (LRB): CARPAL TUNNEL RELEASE (Left)  Patient location during evaluation: PACU Anesthesia Type: General Level of consciousness: awake and alert Pain management: pain level controlled Vital Signs Assessment: post-procedure vital signs reviewed and stable Respiratory status: spontaneous breathing, nonlabored ventilation, respiratory function stable and patient connected to nasal cannula oxygen Cardiovascular status: blood pressure returned to baseline and stable Postop Assessment: no signs of nausea or vomiting Anesthetic complications: no    Last Vitals:  Filed Vitals:   09/27/15 1046 09/27/15 1055  BP: 109/58 118/63  Pulse: 60 64  Temp:  36.6 C  Resp: 17 19    Last Pain:  Filed Vitals:   09/27/15 1057  PainSc: 0-No pain                 Precious Haws Angelly Spearing

## 2015-09-27 NOTE — Discharge Instructions (Signed)
Elevation  Ice pack  RTC in about 14 days    Keep dressing dry  AMBULATORY SURGERY  DISCHARGE INSTRUCTIONS   1) The drugs that you were given will stay in your system until tomorrow so for the next 24 hours you should not:  A) Drive an automobile B) Make any legal decisions C) Drink any alcoholic beverage   2) You may resume regular meals tomorrow.  Today it is better to start with liquids and gradually work up to solid foods.  You may eat anything you prefer, but it is better to start with liquids, then soup and crackers, and gradually work up to solid foods.   3) Please notify your doctor immediately if you have any unusual bleeding, trouble breathing, redness and pain at the surgery site, drainage, fever, or pain not relieved by medication.    4) Additional Instructions:        Please contact your physician with any problems or Same Day Surgery at [phone], Monday through Friday 6 am to 4 pm, or [place] at [facility] number at [phone].

## 2015-09-27 NOTE — H&P (Signed)
  H and P reviewed. No changes. Uploaded at later date. 

## 2015-09-28 DIAGNOSIS — D631 Anemia in chronic kidney disease: Secondary | ICD-10-CM | POA: Diagnosis not present

## 2015-09-28 DIAGNOSIS — N2581 Secondary hyperparathyroidism of renal origin: Secondary | ICD-10-CM | POA: Diagnosis not present

## 2015-09-28 DIAGNOSIS — E119 Type 2 diabetes mellitus without complications: Secondary | ICD-10-CM | POA: Diagnosis not present

## 2015-09-28 DIAGNOSIS — N186 End stage renal disease: Secondary | ICD-10-CM | POA: Diagnosis not present

## 2015-09-30 DIAGNOSIS — E119 Type 2 diabetes mellitus without complications: Secondary | ICD-10-CM | POA: Diagnosis not present

## 2015-09-30 DIAGNOSIS — D631 Anemia in chronic kidney disease: Secondary | ICD-10-CM | POA: Diagnosis not present

## 2015-09-30 DIAGNOSIS — N186 End stage renal disease: Secondary | ICD-10-CM | POA: Diagnosis not present

## 2015-09-30 DIAGNOSIS — N2581 Secondary hyperparathyroidism of renal origin: Secondary | ICD-10-CM | POA: Diagnosis not present

## 2015-10-03 DIAGNOSIS — N2581 Secondary hyperparathyroidism of renal origin: Secondary | ICD-10-CM | POA: Diagnosis not present

## 2015-10-03 DIAGNOSIS — N186 End stage renal disease: Secondary | ICD-10-CM | POA: Diagnosis not present

## 2015-10-03 DIAGNOSIS — E119 Type 2 diabetes mellitus without complications: Secondary | ICD-10-CM | POA: Diagnosis not present

## 2015-10-03 DIAGNOSIS — D631 Anemia in chronic kidney disease: Secondary | ICD-10-CM | POA: Diagnosis not present

## 2015-10-04 DIAGNOSIS — E1122 Type 2 diabetes mellitus with diabetic chronic kidney disease: Secondary | ICD-10-CM | POA: Diagnosis not present

## 2015-10-04 DIAGNOSIS — N186 End stage renal disease: Secondary | ICD-10-CM | POA: Diagnosis not present

## 2015-10-04 DIAGNOSIS — Z992 Dependence on renal dialysis: Secondary | ICD-10-CM | POA: Diagnosis not present

## 2015-10-05 DIAGNOSIS — N2581 Secondary hyperparathyroidism of renal origin: Secondary | ICD-10-CM | POA: Diagnosis not present

## 2015-10-05 DIAGNOSIS — N186 End stage renal disease: Secondary | ICD-10-CM | POA: Diagnosis not present

## 2015-10-05 DIAGNOSIS — D631 Anemia in chronic kidney disease: Secondary | ICD-10-CM | POA: Diagnosis not present

## 2015-10-05 DIAGNOSIS — E119 Type 2 diabetes mellitus without complications: Secondary | ICD-10-CM | POA: Diagnosis not present

## 2015-10-07 DIAGNOSIS — N2581 Secondary hyperparathyroidism of renal origin: Secondary | ICD-10-CM | POA: Diagnosis not present

## 2015-10-07 DIAGNOSIS — E119 Type 2 diabetes mellitus without complications: Secondary | ICD-10-CM | POA: Diagnosis not present

## 2015-10-07 DIAGNOSIS — D631 Anemia in chronic kidney disease: Secondary | ICD-10-CM | POA: Diagnosis not present

## 2015-10-07 DIAGNOSIS — N186 End stage renal disease: Secondary | ICD-10-CM | POA: Diagnosis not present

## 2015-10-10 DIAGNOSIS — E119 Type 2 diabetes mellitus without complications: Secondary | ICD-10-CM | POA: Diagnosis not present

## 2015-10-10 DIAGNOSIS — N186 End stage renal disease: Secondary | ICD-10-CM | POA: Diagnosis not present

## 2015-10-10 DIAGNOSIS — N2581 Secondary hyperparathyroidism of renal origin: Secondary | ICD-10-CM | POA: Diagnosis not present

## 2015-10-10 DIAGNOSIS — D631 Anemia in chronic kidney disease: Secondary | ICD-10-CM | POA: Diagnosis not present

## 2015-10-12 DIAGNOSIS — N2581 Secondary hyperparathyroidism of renal origin: Secondary | ICD-10-CM | POA: Diagnosis not present

## 2015-10-12 DIAGNOSIS — D631 Anemia in chronic kidney disease: Secondary | ICD-10-CM | POA: Diagnosis not present

## 2015-10-12 DIAGNOSIS — E119 Type 2 diabetes mellitus without complications: Secondary | ICD-10-CM | POA: Diagnosis not present

## 2015-10-12 DIAGNOSIS — N186 End stage renal disease: Secondary | ICD-10-CM | POA: Diagnosis not present

## 2015-10-14 DIAGNOSIS — N2581 Secondary hyperparathyroidism of renal origin: Secondary | ICD-10-CM | POA: Diagnosis not present

## 2015-10-14 DIAGNOSIS — N186 End stage renal disease: Secondary | ICD-10-CM | POA: Diagnosis not present

## 2015-10-14 DIAGNOSIS — D631 Anemia in chronic kidney disease: Secondary | ICD-10-CM | POA: Diagnosis not present

## 2015-10-14 DIAGNOSIS — E119 Type 2 diabetes mellitus without complications: Secondary | ICD-10-CM | POA: Diagnosis not present

## 2015-10-17 DIAGNOSIS — E119 Type 2 diabetes mellitus without complications: Secondary | ICD-10-CM | POA: Diagnosis not present

## 2015-10-17 DIAGNOSIS — N186 End stage renal disease: Secondary | ICD-10-CM | POA: Diagnosis not present

## 2015-10-17 DIAGNOSIS — N2581 Secondary hyperparathyroidism of renal origin: Secondary | ICD-10-CM | POA: Diagnosis not present

## 2015-10-17 DIAGNOSIS — D631 Anemia in chronic kidney disease: Secondary | ICD-10-CM | POA: Diagnosis not present

## 2015-10-19 DIAGNOSIS — E119 Type 2 diabetes mellitus without complications: Secondary | ICD-10-CM | POA: Diagnosis not present

## 2015-10-19 DIAGNOSIS — D631 Anemia in chronic kidney disease: Secondary | ICD-10-CM | POA: Diagnosis not present

## 2015-10-19 DIAGNOSIS — N2581 Secondary hyperparathyroidism of renal origin: Secondary | ICD-10-CM | POA: Diagnosis not present

## 2015-10-19 DIAGNOSIS — N186 End stage renal disease: Secondary | ICD-10-CM | POA: Diagnosis not present

## 2015-10-21 DIAGNOSIS — N186 End stage renal disease: Secondary | ICD-10-CM | POA: Diagnosis not present

## 2015-10-21 DIAGNOSIS — D631 Anemia in chronic kidney disease: Secondary | ICD-10-CM | POA: Diagnosis not present

## 2015-10-21 DIAGNOSIS — E119 Type 2 diabetes mellitus without complications: Secondary | ICD-10-CM | POA: Diagnosis not present

## 2015-10-21 DIAGNOSIS — N2581 Secondary hyperparathyroidism of renal origin: Secondary | ICD-10-CM | POA: Diagnosis not present

## 2015-10-24 DIAGNOSIS — E119 Type 2 diabetes mellitus without complications: Secondary | ICD-10-CM | POA: Diagnosis not present

## 2015-10-24 DIAGNOSIS — N186 End stage renal disease: Secondary | ICD-10-CM | POA: Diagnosis not present

## 2015-10-24 DIAGNOSIS — N2581 Secondary hyperparathyroidism of renal origin: Secondary | ICD-10-CM | POA: Diagnosis not present

## 2015-10-24 DIAGNOSIS — D631 Anemia in chronic kidney disease: Secondary | ICD-10-CM | POA: Diagnosis not present

## 2015-10-26 DIAGNOSIS — D631 Anemia in chronic kidney disease: Secondary | ICD-10-CM | POA: Diagnosis not present

## 2015-10-26 DIAGNOSIS — N186 End stage renal disease: Secondary | ICD-10-CM | POA: Diagnosis not present

## 2015-10-26 DIAGNOSIS — N2581 Secondary hyperparathyroidism of renal origin: Secondary | ICD-10-CM | POA: Diagnosis not present

## 2015-10-26 DIAGNOSIS — E119 Type 2 diabetes mellitus without complications: Secondary | ICD-10-CM | POA: Diagnosis not present

## 2015-10-28 DIAGNOSIS — E119 Type 2 diabetes mellitus without complications: Secondary | ICD-10-CM | POA: Diagnosis not present

## 2015-10-28 DIAGNOSIS — D631 Anemia in chronic kidney disease: Secondary | ICD-10-CM | POA: Diagnosis not present

## 2015-10-28 DIAGNOSIS — N186 End stage renal disease: Secondary | ICD-10-CM | POA: Diagnosis not present

## 2015-10-28 DIAGNOSIS — N2581 Secondary hyperparathyroidism of renal origin: Secondary | ICD-10-CM | POA: Diagnosis not present

## 2015-10-31 DIAGNOSIS — N186 End stage renal disease: Secondary | ICD-10-CM | POA: Diagnosis not present

## 2015-10-31 DIAGNOSIS — D631 Anemia in chronic kidney disease: Secondary | ICD-10-CM | POA: Diagnosis not present

## 2015-10-31 DIAGNOSIS — N2581 Secondary hyperparathyroidism of renal origin: Secondary | ICD-10-CM | POA: Diagnosis not present

## 2015-10-31 DIAGNOSIS — E119 Type 2 diabetes mellitus without complications: Secondary | ICD-10-CM | POA: Diagnosis not present

## 2015-11-01 DIAGNOSIS — Y841 Kidney dialysis as the cause of abnormal reaction of the patient, or of later complication, without mention of misadventure at the time of the procedure: Secondary | ICD-10-CM | POA: Diagnosis not present

## 2015-11-01 DIAGNOSIS — M79609 Pain in unspecified limb: Secondary | ICD-10-CM | POA: Diagnosis not present

## 2015-11-01 DIAGNOSIS — E119 Type 2 diabetes mellitus without complications: Secondary | ICD-10-CM | POA: Diagnosis not present

## 2015-11-01 DIAGNOSIS — E785 Hyperlipidemia, unspecified: Secondary | ICD-10-CM | POA: Diagnosis not present

## 2015-11-01 DIAGNOSIS — N186 End stage renal disease: Secondary | ICD-10-CM | POA: Diagnosis not present

## 2015-11-01 DIAGNOSIS — T82858A Stenosis of vascular prosthetic devices, implants and grafts, initial encounter: Secondary | ICD-10-CM | POA: Diagnosis not present

## 2015-11-01 DIAGNOSIS — I10 Essential (primary) hypertension: Secondary | ICD-10-CM | POA: Diagnosis not present

## 2015-11-01 DIAGNOSIS — T82318A Breakdown (mechanical) of other vascular grafts, initial encounter: Secondary | ICD-10-CM | POA: Diagnosis not present

## 2015-11-01 DIAGNOSIS — E669 Obesity, unspecified: Secondary | ICD-10-CM | POA: Diagnosis not present

## 2015-11-01 DIAGNOSIS — Z992 Dependence on renal dialysis: Secondary | ICD-10-CM | POA: Diagnosis not present

## 2015-11-02 DIAGNOSIS — E119 Type 2 diabetes mellitus without complications: Secondary | ICD-10-CM | POA: Diagnosis not present

## 2015-11-02 DIAGNOSIS — D631 Anemia in chronic kidney disease: Secondary | ICD-10-CM | POA: Diagnosis not present

## 2015-11-02 DIAGNOSIS — N186 End stage renal disease: Secondary | ICD-10-CM | POA: Diagnosis not present

## 2015-11-02 DIAGNOSIS — N2581 Secondary hyperparathyroidism of renal origin: Secondary | ICD-10-CM | POA: Diagnosis not present

## 2015-11-03 DIAGNOSIS — N186 End stage renal disease: Secondary | ICD-10-CM | POA: Diagnosis not present

## 2015-11-03 DIAGNOSIS — E1122 Type 2 diabetes mellitus with diabetic chronic kidney disease: Secondary | ICD-10-CM | POA: Diagnosis not present

## 2015-11-03 DIAGNOSIS — Z992 Dependence on renal dialysis: Secondary | ICD-10-CM | POA: Diagnosis not present

## 2015-11-04 DIAGNOSIS — N186 End stage renal disease: Secondary | ICD-10-CM | POA: Diagnosis not present

## 2015-11-04 DIAGNOSIS — N2581 Secondary hyperparathyroidism of renal origin: Secondary | ICD-10-CM | POA: Diagnosis not present

## 2015-11-04 DIAGNOSIS — E119 Type 2 diabetes mellitus without complications: Secondary | ICD-10-CM | POA: Diagnosis not present

## 2015-11-07 DIAGNOSIS — N186 End stage renal disease: Secondary | ICD-10-CM | POA: Diagnosis not present

## 2015-11-07 DIAGNOSIS — E119 Type 2 diabetes mellitus without complications: Secondary | ICD-10-CM | POA: Diagnosis not present

## 2015-11-07 DIAGNOSIS — N2581 Secondary hyperparathyroidism of renal origin: Secondary | ICD-10-CM | POA: Diagnosis not present

## 2015-11-08 DIAGNOSIS — C641 Malignant neoplasm of right kidney, except renal pelvis: Secondary | ICD-10-CM | POA: Diagnosis not present

## 2015-11-09 DIAGNOSIS — N186 End stage renal disease: Secondary | ICD-10-CM | POA: Diagnosis not present

## 2015-11-09 DIAGNOSIS — E119 Type 2 diabetes mellitus without complications: Secondary | ICD-10-CM | POA: Diagnosis not present

## 2015-11-09 DIAGNOSIS — N2581 Secondary hyperparathyroidism of renal origin: Secondary | ICD-10-CM | POA: Diagnosis not present

## 2015-11-11 DIAGNOSIS — N186 End stage renal disease: Secondary | ICD-10-CM | POA: Diagnosis not present

## 2015-11-11 DIAGNOSIS — N2581 Secondary hyperparathyroidism of renal origin: Secondary | ICD-10-CM | POA: Diagnosis not present

## 2015-11-11 DIAGNOSIS — E119 Type 2 diabetes mellitus without complications: Secondary | ICD-10-CM | POA: Diagnosis not present

## 2015-11-14 DIAGNOSIS — E119 Type 2 diabetes mellitus without complications: Secondary | ICD-10-CM | POA: Diagnosis not present

## 2015-11-14 DIAGNOSIS — N2581 Secondary hyperparathyroidism of renal origin: Secondary | ICD-10-CM | POA: Diagnosis not present

## 2015-11-14 DIAGNOSIS — N186 End stage renal disease: Secondary | ICD-10-CM | POA: Diagnosis not present

## 2015-11-15 DIAGNOSIS — J811 Chronic pulmonary edema: Secondary | ICD-10-CM | POA: Diagnosis not present

## 2015-11-15 DIAGNOSIS — R609 Edema, unspecified: Secondary | ICD-10-CM | POA: Diagnosis not present

## 2015-11-15 DIAGNOSIS — Z905 Acquired absence of kidney: Secondary | ICD-10-CM | POA: Diagnosis not present

## 2015-11-15 DIAGNOSIS — N289 Disorder of kidney and ureter, unspecified: Secondary | ICD-10-CM | POA: Diagnosis not present

## 2015-11-15 DIAGNOSIS — Z85528 Personal history of other malignant neoplasm of kidney: Secondary | ICD-10-CM | POA: Diagnosis not present

## 2015-11-15 DIAGNOSIS — I771 Stricture of artery: Secondary | ICD-10-CM | POA: Diagnosis not present

## 2015-11-15 DIAGNOSIS — N281 Cyst of kidney, acquired: Secondary | ICD-10-CM | POA: Diagnosis not present

## 2015-11-15 DIAGNOSIS — I517 Cardiomegaly: Secondary | ICD-10-CM | POA: Diagnosis not present

## 2015-11-15 DIAGNOSIS — C641 Malignant neoplasm of right kidney, except renal pelvis: Secondary | ICD-10-CM | POA: Diagnosis not present

## 2015-11-16 DIAGNOSIS — N2581 Secondary hyperparathyroidism of renal origin: Secondary | ICD-10-CM | POA: Diagnosis not present

## 2015-11-16 DIAGNOSIS — E119 Type 2 diabetes mellitus without complications: Secondary | ICD-10-CM | POA: Diagnosis not present

## 2015-11-16 DIAGNOSIS — N186 End stage renal disease: Secondary | ICD-10-CM | POA: Diagnosis not present

## 2015-11-17 DIAGNOSIS — H40033 Anatomical narrow angle, bilateral: Secondary | ICD-10-CM | POA: Diagnosis not present

## 2015-11-18 DIAGNOSIS — E119 Type 2 diabetes mellitus without complications: Secondary | ICD-10-CM | POA: Diagnosis not present

## 2015-11-18 DIAGNOSIS — N2581 Secondary hyperparathyroidism of renal origin: Secondary | ICD-10-CM | POA: Diagnosis not present

## 2015-11-18 DIAGNOSIS — N186 End stage renal disease: Secondary | ICD-10-CM | POA: Diagnosis not present

## 2015-11-21 DIAGNOSIS — N2581 Secondary hyperparathyroidism of renal origin: Secondary | ICD-10-CM | POA: Diagnosis not present

## 2015-11-21 DIAGNOSIS — N186 End stage renal disease: Secondary | ICD-10-CM | POA: Diagnosis not present

## 2015-11-21 DIAGNOSIS — E119 Type 2 diabetes mellitus without complications: Secondary | ICD-10-CM | POA: Diagnosis not present

## 2015-11-23 DIAGNOSIS — N186 End stage renal disease: Secondary | ICD-10-CM | POA: Diagnosis not present

## 2015-11-23 DIAGNOSIS — E119 Type 2 diabetes mellitus without complications: Secondary | ICD-10-CM | POA: Diagnosis not present

## 2015-11-23 DIAGNOSIS — N2581 Secondary hyperparathyroidism of renal origin: Secondary | ICD-10-CM | POA: Diagnosis not present

## 2015-11-25 DIAGNOSIS — E119 Type 2 diabetes mellitus without complications: Secondary | ICD-10-CM | POA: Diagnosis not present

## 2015-11-25 DIAGNOSIS — N2581 Secondary hyperparathyroidism of renal origin: Secondary | ICD-10-CM | POA: Diagnosis not present

## 2015-11-25 DIAGNOSIS — N186 End stage renal disease: Secondary | ICD-10-CM | POA: Diagnosis not present

## 2015-11-28 DIAGNOSIS — N2581 Secondary hyperparathyroidism of renal origin: Secondary | ICD-10-CM | POA: Diagnosis not present

## 2015-11-28 DIAGNOSIS — N186 End stage renal disease: Secondary | ICD-10-CM | POA: Diagnosis not present

## 2015-11-28 DIAGNOSIS — E119 Type 2 diabetes mellitus without complications: Secondary | ICD-10-CM | POA: Diagnosis not present

## 2015-11-30 DIAGNOSIS — N186 End stage renal disease: Secondary | ICD-10-CM | POA: Diagnosis not present

## 2015-11-30 DIAGNOSIS — E119 Type 2 diabetes mellitus without complications: Secondary | ICD-10-CM | POA: Diagnosis not present

## 2015-11-30 DIAGNOSIS — N2581 Secondary hyperparathyroidism of renal origin: Secondary | ICD-10-CM | POA: Diagnosis not present

## 2015-12-02 DIAGNOSIS — E119 Type 2 diabetes mellitus without complications: Secondary | ICD-10-CM | POA: Diagnosis not present

## 2015-12-02 DIAGNOSIS — N2581 Secondary hyperparathyroidism of renal origin: Secondary | ICD-10-CM | POA: Diagnosis not present

## 2015-12-02 DIAGNOSIS — N186 End stage renal disease: Secondary | ICD-10-CM | POA: Diagnosis not present

## 2015-12-04 DIAGNOSIS — N186 End stage renal disease: Secondary | ICD-10-CM | POA: Diagnosis not present

## 2015-12-04 DIAGNOSIS — E1122 Type 2 diabetes mellitus with diabetic chronic kidney disease: Secondary | ICD-10-CM | POA: Diagnosis not present

## 2015-12-04 DIAGNOSIS — Z992 Dependence on renal dialysis: Secondary | ICD-10-CM | POA: Diagnosis not present

## 2015-12-05 DIAGNOSIS — N186 End stage renal disease: Secondary | ICD-10-CM | POA: Diagnosis not present

## 2015-12-05 DIAGNOSIS — D631 Anemia in chronic kidney disease: Secondary | ICD-10-CM | POA: Diagnosis not present

## 2015-12-05 DIAGNOSIS — N2581 Secondary hyperparathyroidism of renal origin: Secondary | ICD-10-CM | POA: Diagnosis not present

## 2015-12-05 DIAGNOSIS — E119 Type 2 diabetes mellitus without complications: Secondary | ICD-10-CM | POA: Diagnosis not present

## 2015-12-07 DIAGNOSIS — E119 Type 2 diabetes mellitus without complications: Secondary | ICD-10-CM | POA: Diagnosis not present

## 2015-12-07 DIAGNOSIS — N2581 Secondary hyperparathyroidism of renal origin: Secondary | ICD-10-CM | POA: Diagnosis not present

## 2015-12-07 DIAGNOSIS — N186 End stage renal disease: Secondary | ICD-10-CM | POA: Diagnosis not present

## 2015-12-07 DIAGNOSIS — D631 Anemia in chronic kidney disease: Secondary | ICD-10-CM | POA: Diagnosis not present

## 2015-12-09 DIAGNOSIS — D631 Anemia in chronic kidney disease: Secondary | ICD-10-CM | POA: Diagnosis not present

## 2015-12-09 DIAGNOSIS — N186 End stage renal disease: Secondary | ICD-10-CM | POA: Diagnosis not present

## 2015-12-09 DIAGNOSIS — E119 Type 2 diabetes mellitus without complications: Secondary | ICD-10-CM | POA: Diagnosis not present

## 2015-12-09 DIAGNOSIS — N2581 Secondary hyperparathyroidism of renal origin: Secondary | ICD-10-CM | POA: Diagnosis not present

## 2015-12-12 DIAGNOSIS — D631 Anemia in chronic kidney disease: Secondary | ICD-10-CM | POA: Diagnosis not present

## 2015-12-12 DIAGNOSIS — E119 Type 2 diabetes mellitus without complications: Secondary | ICD-10-CM | POA: Diagnosis not present

## 2015-12-12 DIAGNOSIS — N2581 Secondary hyperparathyroidism of renal origin: Secondary | ICD-10-CM | POA: Diagnosis not present

## 2015-12-12 DIAGNOSIS — N186 End stage renal disease: Secondary | ICD-10-CM | POA: Diagnosis not present

## 2015-12-14 DIAGNOSIS — N2581 Secondary hyperparathyroidism of renal origin: Secondary | ICD-10-CM | POA: Diagnosis not present

## 2015-12-14 DIAGNOSIS — N186 End stage renal disease: Secondary | ICD-10-CM | POA: Diagnosis not present

## 2015-12-14 DIAGNOSIS — E119 Type 2 diabetes mellitus without complications: Secondary | ICD-10-CM | POA: Diagnosis not present

## 2015-12-14 DIAGNOSIS — D631 Anemia in chronic kidney disease: Secondary | ICD-10-CM | POA: Diagnosis not present

## 2015-12-16 DIAGNOSIS — E119 Type 2 diabetes mellitus without complications: Secondary | ICD-10-CM | POA: Diagnosis not present

## 2015-12-16 DIAGNOSIS — D631 Anemia in chronic kidney disease: Secondary | ICD-10-CM | POA: Diagnosis not present

## 2015-12-16 DIAGNOSIS — N186 End stage renal disease: Secondary | ICD-10-CM | POA: Diagnosis not present

## 2015-12-16 DIAGNOSIS — N2581 Secondary hyperparathyroidism of renal origin: Secondary | ICD-10-CM | POA: Diagnosis not present

## 2015-12-19 DIAGNOSIS — N2581 Secondary hyperparathyroidism of renal origin: Secondary | ICD-10-CM | POA: Diagnosis not present

## 2015-12-19 DIAGNOSIS — N186 End stage renal disease: Secondary | ICD-10-CM | POA: Diagnosis not present

## 2015-12-19 DIAGNOSIS — D631 Anemia in chronic kidney disease: Secondary | ICD-10-CM | POA: Diagnosis not present

## 2015-12-19 DIAGNOSIS — E119 Type 2 diabetes mellitus without complications: Secondary | ICD-10-CM | POA: Diagnosis not present

## 2015-12-21 DIAGNOSIS — E119 Type 2 diabetes mellitus without complications: Secondary | ICD-10-CM | POA: Diagnosis not present

## 2015-12-21 DIAGNOSIS — D631 Anemia in chronic kidney disease: Secondary | ICD-10-CM | POA: Diagnosis not present

## 2015-12-21 DIAGNOSIS — N2581 Secondary hyperparathyroidism of renal origin: Secondary | ICD-10-CM | POA: Diagnosis not present

## 2015-12-21 DIAGNOSIS — N186 End stage renal disease: Secondary | ICD-10-CM | POA: Diagnosis not present

## 2015-12-23 DIAGNOSIS — D631 Anemia in chronic kidney disease: Secondary | ICD-10-CM | POA: Diagnosis not present

## 2015-12-23 DIAGNOSIS — E119 Type 2 diabetes mellitus without complications: Secondary | ICD-10-CM | POA: Diagnosis not present

## 2015-12-23 DIAGNOSIS — N2581 Secondary hyperparathyroidism of renal origin: Secondary | ICD-10-CM | POA: Diagnosis not present

## 2015-12-23 DIAGNOSIS — N186 End stage renal disease: Secondary | ICD-10-CM | POA: Diagnosis not present

## 2015-12-26 DIAGNOSIS — D631 Anemia in chronic kidney disease: Secondary | ICD-10-CM | POA: Diagnosis not present

## 2015-12-26 DIAGNOSIS — N186 End stage renal disease: Secondary | ICD-10-CM | POA: Diagnosis not present

## 2015-12-26 DIAGNOSIS — E119 Type 2 diabetes mellitus without complications: Secondary | ICD-10-CM | POA: Diagnosis not present

## 2015-12-26 DIAGNOSIS — N2581 Secondary hyperparathyroidism of renal origin: Secondary | ICD-10-CM | POA: Diagnosis not present

## 2015-12-28 DIAGNOSIS — N2581 Secondary hyperparathyroidism of renal origin: Secondary | ICD-10-CM | POA: Diagnosis not present

## 2015-12-28 DIAGNOSIS — D631 Anemia in chronic kidney disease: Secondary | ICD-10-CM | POA: Diagnosis not present

## 2015-12-28 DIAGNOSIS — E119 Type 2 diabetes mellitus without complications: Secondary | ICD-10-CM | POA: Diagnosis not present

## 2015-12-28 DIAGNOSIS — N186 End stage renal disease: Secondary | ICD-10-CM | POA: Diagnosis not present

## 2015-12-30 DIAGNOSIS — E119 Type 2 diabetes mellitus without complications: Secondary | ICD-10-CM | POA: Diagnosis not present

## 2015-12-30 DIAGNOSIS — D631 Anemia in chronic kidney disease: Secondary | ICD-10-CM | POA: Diagnosis not present

## 2015-12-30 DIAGNOSIS — N2581 Secondary hyperparathyroidism of renal origin: Secondary | ICD-10-CM | POA: Diagnosis not present

## 2015-12-30 DIAGNOSIS — N186 End stage renal disease: Secondary | ICD-10-CM | POA: Diagnosis not present

## 2016-01-02 DIAGNOSIS — N186 End stage renal disease: Secondary | ICD-10-CM | POA: Diagnosis not present

## 2016-01-02 DIAGNOSIS — N2581 Secondary hyperparathyroidism of renal origin: Secondary | ICD-10-CM | POA: Diagnosis not present

## 2016-01-02 DIAGNOSIS — E119 Type 2 diabetes mellitus without complications: Secondary | ICD-10-CM | POA: Diagnosis not present

## 2016-01-02 DIAGNOSIS — D631 Anemia in chronic kidney disease: Secondary | ICD-10-CM | POA: Diagnosis not present

## 2016-01-04 DIAGNOSIS — E1122 Type 2 diabetes mellitus with diabetic chronic kidney disease: Secondary | ICD-10-CM | POA: Diagnosis not present

## 2016-01-04 DIAGNOSIS — Z992 Dependence on renal dialysis: Secondary | ICD-10-CM | POA: Diagnosis not present

## 2016-01-04 DIAGNOSIS — E119 Type 2 diabetes mellitus without complications: Secondary | ICD-10-CM | POA: Diagnosis not present

## 2016-01-04 DIAGNOSIS — N2581 Secondary hyperparathyroidism of renal origin: Secondary | ICD-10-CM | POA: Diagnosis not present

## 2016-01-04 DIAGNOSIS — N186 End stage renal disease: Secondary | ICD-10-CM | POA: Diagnosis not present

## 2016-01-04 DIAGNOSIS — D631 Anemia in chronic kidney disease: Secondary | ICD-10-CM | POA: Diagnosis not present

## 2016-01-06 DIAGNOSIS — N2581 Secondary hyperparathyroidism of renal origin: Secondary | ICD-10-CM | POA: Diagnosis not present

## 2016-01-06 DIAGNOSIS — E119 Type 2 diabetes mellitus without complications: Secondary | ICD-10-CM | POA: Diagnosis not present

## 2016-01-06 DIAGNOSIS — N186 End stage renal disease: Secondary | ICD-10-CM | POA: Diagnosis not present

## 2016-01-06 DIAGNOSIS — Z23 Encounter for immunization: Secondary | ICD-10-CM | POA: Diagnosis not present

## 2016-01-06 DIAGNOSIS — D631 Anemia in chronic kidney disease: Secondary | ICD-10-CM | POA: Diagnosis not present

## 2016-01-09 DIAGNOSIS — Z23 Encounter for immunization: Secondary | ICD-10-CM | POA: Diagnosis not present

## 2016-01-09 DIAGNOSIS — N186 End stage renal disease: Secondary | ICD-10-CM | POA: Diagnosis not present

## 2016-01-09 DIAGNOSIS — D631 Anemia in chronic kidney disease: Secondary | ICD-10-CM | POA: Diagnosis not present

## 2016-01-09 DIAGNOSIS — N2581 Secondary hyperparathyroidism of renal origin: Secondary | ICD-10-CM | POA: Diagnosis not present

## 2016-01-09 DIAGNOSIS — E119 Type 2 diabetes mellitus without complications: Secondary | ICD-10-CM | POA: Diagnosis not present

## 2016-01-11 DIAGNOSIS — N186 End stage renal disease: Secondary | ICD-10-CM | POA: Diagnosis not present

## 2016-01-11 DIAGNOSIS — D631 Anemia in chronic kidney disease: Secondary | ICD-10-CM | POA: Diagnosis not present

## 2016-01-11 DIAGNOSIS — N2581 Secondary hyperparathyroidism of renal origin: Secondary | ICD-10-CM | POA: Diagnosis not present

## 2016-01-11 DIAGNOSIS — E119 Type 2 diabetes mellitus without complications: Secondary | ICD-10-CM | POA: Diagnosis not present

## 2016-01-11 DIAGNOSIS — Z23 Encounter for immunization: Secondary | ICD-10-CM | POA: Diagnosis not present

## 2016-01-13 DIAGNOSIS — E119 Type 2 diabetes mellitus without complications: Secondary | ICD-10-CM | POA: Diagnosis not present

## 2016-01-13 DIAGNOSIS — N186 End stage renal disease: Secondary | ICD-10-CM | POA: Diagnosis not present

## 2016-01-13 DIAGNOSIS — Z23 Encounter for immunization: Secondary | ICD-10-CM | POA: Diagnosis not present

## 2016-01-13 DIAGNOSIS — D631 Anemia in chronic kidney disease: Secondary | ICD-10-CM | POA: Diagnosis not present

## 2016-01-13 DIAGNOSIS — N2581 Secondary hyperparathyroidism of renal origin: Secondary | ICD-10-CM | POA: Diagnosis not present

## 2016-01-15 DIAGNOSIS — N2581 Secondary hyperparathyroidism of renal origin: Secondary | ICD-10-CM | POA: Diagnosis not present

## 2016-01-15 DIAGNOSIS — N186 End stage renal disease: Secondary | ICD-10-CM | POA: Diagnosis not present

## 2016-01-15 DIAGNOSIS — D631 Anemia in chronic kidney disease: Secondary | ICD-10-CM | POA: Diagnosis not present

## 2016-01-15 DIAGNOSIS — Z23 Encounter for immunization: Secondary | ICD-10-CM | POA: Diagnosis not present

## 2016-01-15 DIAGNOSIS — E119 Type 2 diabetes mellitus without complications: Secondary | ICD-10-CM | POA: Diagnosis not present

## 2016-01-18 DIAGNOSIS — D631 Anemia in chronic kidney disease: Secondary | ICD-10-CM | POA: Diagnosis not present

## 2016-01-18 DIAGNOSIS — E119 Type 2 diabetes mellitus without complications: Secondary | ICD-10-CM | POA: Diagnosis not present

## 2016-01-18 DIAGNOSIS — N2581 Secondary hyperparathyroidism of renal origin: Secondary | ICD-10-CM | POA: Diagnosis not present

## 2016-01-18 DIAGNOSIS — Z23 Encounter for immunization: Secondary | ICD-10-CM | POA: Diagnosis not present

## 2016-01-18 DIAGNOSIS — N186 End stage renal disease: Secondary | ICD-10-CM | POA: Diagnosis not present

## 2016-01-20 DIAGNOSIS — E119 Type 2 diabetes mellitus without complications: Secondary | ICD-10-CM | POA: Diagnosis not present

## 2016-01-20 DIAGNOSIS — N2581 Secondary hyperparathyroidism of renal origin: Secondary | ICD-10-CM | POA: Diagnosis not present

## 2016-01-20 DIAGNOSIS — N186 End stage renal disease: Secondary | ICD-10-CM | POA: Diagnosis not present

## 2016-01-20 DIAGNOSIS — D631 Anemia in chronic kidney disease: Secondary | ICD-10-CM | POA: Diagnosis not present

## 2016-01-20 DIAGNOSIS — Z23 Encounter for immunization: Secondary | ICD-10-CM | POA: Diagnosis not present

## 2016-01-23 DIAGNOSIS — N186 End stage renal disease: Secondary | ICD-10-CM | POA: Diagnosis not present

## 2016-01-23 DIAGNOSIS — D631 Anemia in chronic kidney disease: Secondary | ICD-10-CM | POA: Diagnosis not present

## 2016-01-23 DIAGNOSIS — N2581 Secondary hyperparathyroidism of renal origin: Secondary | ICD-10-CM | POA: Diagnosis not present

## 2016-01-23 DIAGNOSIS — E119 Type 2 diabetes mellitus without complications: Secondary | ICD-10-CM | POA: Diagnosis not present

## 2016-01-23 DIAGNOSIS — Z23 Encounter for immunization: Secondary | ICD-10-CM | POA: Diagnosis not present

## 2016-01-25 DIAGNOSIS — D631 Anemia in chronic kidney disease: Secondary | ICD-10-CM | POA: Diagnosis not present

## 2016-01-25 DIAGNOSIS — N2581 Secondary hyperparathyroidism of renal origin: Secondary | ICD-10-CM | POA: Diagnosis not present

## 2016-01-25 DIAGNOSIS — E119 Type 2 diabetes mellitus without complications: Secondary | ICD-10-CM | POA: Diagnosis not present

## 2016-01-25 DIAGNOSIS — N186 End stage renal disease: Secondary | ICD-10-CM | POA: Diagnosis not present

## 2016-01-25 DIAGNOSIS — Z23 Encounter for immunization: Secondary | ICD-10-CM | POA: Diagnosis not present

## 2016-01-27 DIAGNOSIS — E119 Type 2 diabetes mellitus without complications: Secondary | ICD-10-CM | POA: Diagnosis not present

## 2016-01-27 DIAGNOSIS — N186 End stage renal disease: Secondary | ICD-10-CM | POA: Diagnosis not present

## 2016-01-27 DIAGNOSIS — D631 Anemia in chronic kidney disease: Secondary | ICD-10-CM | POA: Diagnosis not present

## 2016-01-27 DIAGNOSIS — Z23 Encounter for immunization: Secondary | ICD-10-CM | POA: Diagnosis not present

## 2016-01-27 DIAGNOSIS — N2581 Secondary hyperparathyroidism of renal origin: Secondary | ICD-10-CM | POA: Diagnosis not present

## 2016-01-30 DIAGNOSIS — E119 Type 2 diabetes mellitus without complications: Secondary | ICD-10-CM | POA: Diagnosis not present

## 2016-01-30 DIAGNOSIS — D631 Anemia in chronic kidney disease: Secondary | ICD-10-CM | POA: Diagnosis not present

## 2016-01-30 DIAGNOSIS — N2581 Secondary hyperparathyroidism of renal origin: Secondary | ICD-10-CM | POA: Diagnosis not present

## 2016-01-30 DIAGNOSIS — Z23 Encounter for immunization: Secondary | ICD-10-CM | POA: Diagnosis not present

## 2016-01-30 DIAGNOSIS — N186 End stage renal disease: Secondary | ICD-10-CM | POA: Diagnosis not present

## 2016-02-01 DIAGNOSIS — Z23 Encounter for immunization: Secondary | ICD-10-CM | POA: Diagnosis not present

## 2016-02-01 DIAGNOSIS — D631 Anemia in chronic kidney disease: Secondary | ICD-10-CM | POA: Diagnosis not present

## 2016-02-01 DIAGNOSIS — N186 End stage renal disease: Secondary | ICD-10-CM | POA: Diagnosis not present

## 2016-02-01 DIAGNOSIS — E119 Type 2 diabetes mellitus without complications: Secondary | ICD-10-CM | POA: Diagnosis not present

## 2016-02-01 DIAGNOSIS — N2581 Secondary hyperparathyroidism of renal origin: Secondary | ICD-10-CM | POA: Diagnosis not present

## 2016-02-03 DIAGNOSIS — D631 Anemia in chronic kidney disease: Secondary | ICD-10-CM | POA: Diagnosis not present

## 2016-02-03 DIAGNOSIS — Z23 Encounter for immunization: Secondary | ICD-10-CM | POA: Diagnosis not present

## 2016-02-03 DIAGNOSIS — N2581 Secondary hyperparathyroidism of renal origin: Secondary | ICD-10-CM | POA: Diagnosis not present

## 2016-02-03 DIAGNOSIS — E1122 Type 2 diabetes mellitus with diabetic chronic kidney disease: Secondary | ICD-10-CM | POA: Diagnosis not present

## 2016-02-03 DIAGNOSIS — Z992 Dependence on renal dialysis: Secondary | ICD-10-CM | POA: Diagnosis not present

## 2016-02-03 DIAGNOSIS — E119 Type 2 diabetes mellitus without complications: Secondary | ICD-10-CM | POA: Diagnosis not present

## 2016-02-03 DIAGNOSIS — N186 End stage renal disease: Secondary | ICD-10-CM | POA: Diagnosis not present

## 2016-02-05 DIAGNOSIS — M79674 Pain in right toe(s): Secondary | ICD-10-CM | POA: Diagnosis not present

## 2016-02-05 DIAGNOSIS — R200 Anesthesia of skin: Secondary | ICD-10-CM | POA: Diagnosis not present

## 2016-02-05 DIAGNOSIS — M79675 Pain in left toe(s): Secondary | ICD-10-CM | POA: Diagnosis not present

## 2016-02-05 DIAGNOSIS — R600 Localized edema: Secondary | ICD-10-CM | POA: Diagnosis not present

## 2016-02-05 DIAGNOSIS — B351 Tinea unguium: Secondary | ICD-10-CM | POA: Diagnosis not present

## 2016-02-06 DIAGNOSIS — E119 Type 2 diabetes mellitus without complications: Secondary | ICD-10-CM | POA: Diagnosis not present

## 2016-02-06 DIAGNOSIS — N186 End stage renal disease: Secondary | ICD-10-CM | POA: Diagnosis not present

## 2016-02-06 DIAGNOSIS — N2581 Secondary hyperparathyroidism of renal origin: Secondary | ICD-10-CM | POA: Diagnosis not present

## 2016-02-06 DIAGNOSIS — D631 Anemia in chronic kidney disease: Secondary | ICD-10-CM | POA: Diagnosis not present

## 2016-02-08 DIAGNOSIS — D631 Anemia in chronic kidney disease: Secondary | ICD-10-CM | POA: Diagnosis not present

## 2016-02-08 DIAGNOSIS — N2581 Secondary hyperparathyroidism of renal origin: Secondary | ICD-10-CM | POA: Diagnosis not present

## 2016-02-08 DIAGNOSIS — E119 Type 2 diabetes mellitus without complications: Secondary | ICD-10-CM | POA: Diagnosis not present

## 2016-02-08 DIAGNOSIS — N186 End stage renal disease: Secondary | ICD-10-CM | POA: Diagnosis not present

## 2016-02-10 DIAGNOSIS — E119 Type 2 diabetes mellitus without complications: Secondary | ICD-10-CM | POA: Diagnosis not present

## 2016-02-10 DIAGNOSIS — N2581 Secondary hyperparathyroidism of renal origin: Secondary | ICD-10-CM | POA: Diagnosis not present

## 2016-02-10 DIAGNOSIS — N186 End stage renal disease: Secondary | ICD-10-CM | POA: Diagnosis not present

## 2016-02-10 DIAGNOSIS — D631 Anemia in chronic kidney disease: Secondary | ICD-10-CM | POA: Diagnosis not present

## 2016-02-13 DIAGNOSIS — D631 Anemia in chronic kidney disease: Secondary | ICD-10-CM | POA: Diagnosis not present

## 2016-02-13 DIAGNOSIS — N186 End stage renal disease: Secondary | ICD-10-CM | POA: Diagnosis not present

## 2016-02-13 DIAGNOSIS — E119 Type 2 diabetes mellitus without complications: Secondary | ICD-10-CM | POA: Diagnosis not present

## 2016-02-13 DIAGNOSIS — N2581 Secondary hyperparathyroidism of renal origin: Secondary | ICD-10-CM | POA: Diagnosis not present

## 2016-02-15 DIAGNOSIS — N186 End stage renal disease: Secondary | ICD-10-CM | POA: Diagnosis not present

## 2016-02-15 DIAGNOSIS — D631 Anemia in chronic kidney disease: Secondary | ICD-10-CM | POA: Diagnosis not present

## 2016-02-15 DIAGNOSIS — E119 Type 2 diabetes mellitus without complications: Secondary | ICD-10-CM | POA: Diagnosis not present

## 2016-02-15 DIAGNOSIS — N2581 Secondary hyperparathyroidism of renal origin: Secondary | ICD-10-CM | POA: Diagnosis not present

## 2016-02-17 DIAGNOSIS — N186 End stage renal disease: Secondary | ICD-10-CM | POA: Diagnosis not present

## 2016-02-17 DIAGNOSIS — E119 Type 2 diabetes mellitus without complications: Secondary | ICD-10-CM | POA: Diagnosis not present

## 2016-02-17 DIAGNOSIS — N2581 Secondary hyperparathyroidism of renal origin: Secondary | ICD-10-CM | POA: Diagnosis not present

## 2016-02-17 DIAGNOSIS — D631 Anemia in chronic kidney disease: Secondary | ICD-10-CM | POA: Diagnosis not present

## 2016-02-20 DIAGNOSIS — N186 End stage renal disease: Secondary | ICD-10-CM | POA: Diagnosis not present

## 2016-02-20 DIAGNOSIS — N2581 Secondary hyperparathyroidism of renal origin: Secondary | ICD-10-CM | POA: Diagnosis not present

## 2016-02-20 DIAGNOSIS — E119 Type 2 diabetes mellitus without complications: Secondary | ICD-10-CM | POA: Diagnosis not present

## 2016-02-20 DIAGNOSIS — D631 Anemia in chronic kidney disease: Secondary | ICD-10-CM | POA: Diagnosis not present

## 2016-02-22 DIAGNOSIS — E119 Type 2 diabetes mellitus without complications: Secondary | ICD-10-CM | POA: Diagnosis not present

## 2016-02-22 DIAGNOSIS — N186 End stage renal disease: Secondary | ICD-10-CM | POA: Diagnosis not present

## 2016-02-22 DIAGNOSIS — D631 Anemia in chronic kidney disease: Secondary | ICD-10-CM | POA: Diagnosis not present

## 2016-02-22 DIAGNOSIS — N2581 Secondary hyperparathyroidism of renal origin: Secondary | ICD-10-CM | POA: Diagnosis not present

## 2016-02-24 DIAGNOSIS — D631 Anemia in chronic kidney disease: Secondary | ICD-10-CM | POA: Diagnosis not present

## 2016-02-24 DIAGNOSIS — N186 End stage renal disease: Secondary | ICD-10-CM | POA: Diagnosis not present

## 2016-02-24 DIAGNOSIS — N2581 Secondary hyperparathyroidism of renal origin: Secondary | ICD-10-CM | POA: Diagnosis not present

## 2016-02-24 DIAGNOSIS — E119 Type 2 diabetes mellitus without complications: Secondary | ICD-10-CM | POA: Diagnosis not present

## 2016-02-27 DIAGNOSIS — D631 Anemia in chronic kidney disease: Secondary | ICD-10-CM | POA: Diagnosis not present

## 2016-02-27 DIAGNOSIS — N2581 Secondary hyperparathyroidism of renal origin: Secondary | ICD-10-CM | POA: Diagnosis not present

## 2016-02-27 DIAGNOSIS — N186 End stage renal disease: Secondary | ICD-10-CM | POA: Diagnosis not present

## 2016-02-27 DIAGNOSIS — E119 Type 2 diabetes mellitus without complications: Secondary | ICD-10-CM | POA: Diagnosis not present

## 2016-02-29 DIAGNOSIS — D631 Anemia in chronic kidney disease: Secondary | ICD-10-CM | POA: Diagnosis not present

## 2016-02-29 DIAGNOSIS — N186 End stage renal disease: Secondary | ICD-10-CM | POA: Diagnosis not present

## 2016-02-29 DIAGNOSIS — N2581 Secondary hyperparathyroidism of renal origin: Secondary | ICD-10-CM | POA: Diagnosis not present

## 2016-02-29 DIAGNOSIS — E119 Type 2 diabetes mellitus without complications: Secondary | ICD-10-CM | POA: Diagnosis not present

## 2016-03-02 DIAGNOSIS — N186 End stage renal disease: Secondary | ICD-10-CM | POA: Diagnosis not present

## 2016-03-02 DIAGNOSIS — N2581 Secondary hyperparathyroidism of renal origin: Secondary | ICD-10-CM | POA: Diagnosis not present

## 2016-03-02 DIAGNOSIS — D631 Anemia in chronic kidney disease: Secondary | ICD-10-CM | POA: Diagnosis not present

## 2016-03-02 DIAGNOSIS — E119 Type 2 diabetes mellitus without complications: Secondary | ICD-10-CM | POA: Diagnosis not present

## 2016-03-04 ENCOUNTER — Other Ambulatory Visit (INDEPENDENT_AMBULATORY_CARE_PROVIDER_SITE_OTHER): Payer: Self-pay | Admitting: Vascular Surgery

## 2016-03-04 ENCOUNTER — Ambulatory Visit (INDEPENDENT_AMBULATORY_CARE_PROVIDER_SITE_OTHER): Payer: Medicare Other

## 2016-03-04 ENCOUNTER — Ambulatory Visit (INDEPENDENT_AMBULATORY_CARE_PROVIDER_SITE_OTHER): Payer: Medicare Other | Admitting: Vascular Surgery

## 2016-03-04 ENCOUNTER — Encounter (INDEPENDENT_AMBULATORY_CARE_PROVIDER_SITE_OTHER): Payer: Self-pay | Admitting: Vascular Surgery

## 2016-03-04 DIAGNOSIS — Z992 Dependence on renal dialysis: Secondary | ICD-10-CM

## 2016-03-04 DIAGNOSIS — N186 End stage renal disease: Secondary | ICD-10-CM | POA: Diagnosis not present

## 2016-03-04 DIAGNOSIS — T829XXA Unspecified complication of cardiac and vascular prosthetic device, implant and graft, initial encounter: Secondary | ICD-10-CM

## 2016-03-04 DIAGNOSIS — T829XXS Unspecified complication of cardiac and vascular prosthetic device, implant and graft, sequela: Secondary | ICD-10-CM | POA: Diagnosis not present

## 2016-03-04 DIAGNOSIS — N2889 Other specified disorders of kidney and ureter: Secondary | ICD-10-CM

## 2016-03-04 DIAGNOSIS — E782 Mixed hyperlipidemia: Secondary | ICD-10-CM

## 2016-03-04 DIAGNOSIS — I871 Compression of vein: Secondary | ICD-10-CM

## 2016-03-04 DIAGNOSIS — I151 Hypertension secondary to other renal disorders: Secondary | ICD-10-CM | POA: Diagnosis not present

## 2016-03-05 DIAGNOSIS — E1122 Type 2 diabetes mellitus with diabetic chronic kidney disease: Secondary | ICD-10-CM | POA: Diagnosis not present

## 2016-03-05 DIAGNOSIS — N2581 Secondary hyperparathyroidism of renal origin: Secondary | ICD-10-CM | POA: Diagnosis not present

## 2016-03-05 DIAGNOSIS — D631 Anemia in chronic kidney disease: Secondary | ICD-10-CM | POA: Diagnosis not present

## 2016-03-05 DIAGNOSIS — E119 Type 2 diabetes mellitus without complications: Secondary | ICD-10-CM | POA: Diagnosis not present

## 2016-03-05 DIAGNOSIS — N186 End stage renal disease: Secondary | ICD-10-CM | POA: Diagnosis not present

## 2016-03-05 DIAGNOSIS — Z992 Dependence on renal dialysis: Secondary | ICD-10-CM | POA: Diagnosis not present

## 2016-03-07 DIAGNOSIS — E119 Type 2 diabetes mellitus without complications: Secondary | ICD-10-CM | POA: Diagnosis not present

## 2016-03-07 DIAGNOSIS — N2581 Secondary hyperparathyroidism of renal origin: Secondary | ICD-10-CM | POA: Diagnosis not present

## 2016-03-07 DIAGNOSIS — N186 End stage renal disease: Secondary | ICD-10-CM | POA: Diagnosis not present

## 2016-03-07 DIAGNOSIS — D631 Anemia in chronic kidney disease: Secondary | ICD-10-CM | POA: Diagnosis not present

## 2016-03-09 DIAGNOSIS — D631 Anemia in chronic kidney disease: Secondary | ICD-10-CM | POA: Diagnosis not present

## 2016-03-09 DIAGNOSIS — N2581 Secondary hyperparathyroidism of renal origin: Secondary | ICD-10-CM | POA: Diagnosis not present

## 2016-03-09 DIAGNOSIS — E119 Type 2 diabetes mellitus without complications: Secondary | ICD-10-CM | POA: Diagnosis not present

## 2016-03-09 DIAGNOSIS — N186 End stage renal disease: Secondary | ICD-10-CM | POA: Diagnosis not present

## 2016-03-11 DIAGNOSIS — E440 Moderate protein-calorie malnutrition: Secondary | ICD-10-CM | POA: Insufficient documentation

## 2016-03-12 DIAGNOSIS — E119 Type 2 diabetes mellitus without complications: Secondary | ICD-10-CM | POA: Diagnosis not present

## 2016-03-12 DIAGNOSIS — N186 End stage renal disease: Secondary | ICD-10-CM | POA: Diagnosis not present

## 2016-03-12 DIAGNOSIS — D631 Anemia in chronic kidney disease: Secondary | ICD-10-CM | POA: Diagnosis not present

## 2016-03-12 DIAGNOSIS — N2581 Secondary hyperparathyroidism of renal origin: Secondary | ICD-10-CM | POA: Diagnosis not present

## 2016-03-14 DIAGNOSIS — D631 Anemia in chronic kidney disease: Secondary | ICD-10-CM | POA: Diagnosis not present

## 2016-03-14 DIAGNOSIS — N2581 Secondary hyperparathyroidism of renal origin: Secondary | ICD-10-CM | POA: Diagnosis not present

## 2016-03-14 DIAGNOSIS — N186 End stage renal disease: Secondary | ICD-10-CM | POA: Diagnosis not present

## 2016-03-14 DIAGNOSIS — E119 Type 2 diabetes mellitus without complications: Secondary | ICD-10-CM | POA: Diagnosis not present

## 2016-03-16 DIAGNOSIS — N2581 Secondary hyperparathyroidism of renal origin: Secondary | ICD-10-CM | POA: Diagnosis not present

## 2016-03-16 DIAGNOSIS — N186 End stage renal disease: Secondary | ICD-10-CM | POA: Diagnosis not present

## 2016-03-16 DIAGNOSIS — E119 Type 2 diabetes mellitus without complications: Secondary | ICD-10-CM | POA: Diagnosis not present

## 2016-03-16 DIAGNOSIS — D631 Anemia in chronic kidney disease: Secondary | ICD-10-CM | POA: Diagnosis not present

## 2016-03-17 DIAGNOSIS — T829XXA Unspecified complication of cardiac and vascular prosthetic device, implant and graft, initial encounter: Secondary | ICD-10-CM | POA: Insufficient documentation

## 2016-03-17 DIAGNOSIS — I871 Compression of vein: Secondary | ICD-10-CM | POA: Insufficient documentation

## 2016-03-17 DIAGNOSIS — E785 Hyperlipidemia, unspecified: Secondary | ICD-10-CM | POA: Insufficient documentation

## 2016-03-17 DIAGNOSIS — I10 Essential (primary) hypertension: Secondary | ICD-10-CM

## 2016-03-17 DIAGNOSIS — N186 End stage renal disease: Secondary | ICD-10-CM | POA: Insufficient documentation

## 2016-03-17 DIAGNOSIS — E1169 Type 2 diabetes mellitus with other specified complication: Secondary | ICD-10-CM | POA: Insufficient documentation

## 2016-03-17 NOTE — Progress Notes (Signed)
[facility] Admission History & Physical  MRN : [id number]  Kelli Nguyen is a 70 y.o. ([birth year]) female who presents with chief complaint of  Chief Complaint  Patient presents with  . Follow-up  .  History of Present Illness: The patient returns to the office for followup of their dialysis access. The function of the access has been stable. The patient denies increased bleeding time or increased recirculation. Patient denies difficulty with cannulation. The patient denies hand pain or other symptoms consistent with steal phenomena.  No significant arm swelling.  The patient denies redness or swelling at the access site. The patient denies fever or chills at home or while on dialysis.  The patient denies amaurosis fugax or recent TIA symptoms. There are no recent neurological changes noted. The patient denies claudication symptoms or rest pain symptoms. The patient denies history of DVT, PE or superficial thrombophlebitis. The patient denies recent episodes of angina or shortness of breath.       Current Meds  Medication Sig  . aspirin 81 MG tablet Take 81 mg by mouth daily.  . cinacalcet (SENSIPAR) 30 MG tablet Take 30 mg by mouth daily.  . isosorbide mononitrate (IMDUR) 30 MG 24 hr tablet Take 30 mg by mouth daily.  . metoprolol succinate (TOPROL-XL) 25 MG 24 hr tablet Take 25 mg by mouth daily.  . pravastatin (PRAVACHOL) 20 MG tablet Take 20 mg by mouth daily.  . sevelamer carbonate (RENVELA) 800 MG tablet Take 800 mg by mouth 3 (three) times daily with meals.    Past Medical History:  Diagnosis Date  . Anemia   . Chronic cough   . Chronic kidney disease   . Coronary artery disease   . Diabetes mellitus without complication ([place])   . Dialysis patient ([place])    T-TH-SAT  . GERD (gastroesophageal reflux disease)   . Hypertension     Past Surgical History:  Procedure Laterality Date  . ABDOMINAL HYSTERECTOMY    . CARPAL TUNNEL RELEASE Left  09/27/2015   Procedure: CARPAL TUNNEL RELEASE;  Surgeon: Leanor Kail, MD;  Location: [facility] ORS;  Service: Orthopedics;  Laterality: Left;  . COLONOSCOPY    . COLONOSCOPY WITH PROPOFOL N/A 11/28/2014   Procedure: COLONOSCOPY WITH PROPOFOL;  Surgeon: Manya Silvas, MD;  Location: [facility] ENDOSCOPY;  Service: Endoscopy;  Laterality: N/A;  . CORONARY ARTERY BYPASS GRAFT    . PERIPHERAL VASCULAR CATHETERIZATION Left 01/18/2015   Procedure: A/V Shuntogram/Fistulagram;  Surgeon: Katha Cabal, MD;  Location: [place] [facility];  Service: Cardiovascular;  Laterality: Left;  . PERIPHERAL VASCULAR CATHETERIZATION Left 01/18/2015   Procedure: A/V Shunt Intervention;  Surgeon: Katha Cabal, MD;  Location: [place] [facility];  Service: Cardiovascular;  Laterality: Left;  . PERIPHERAL VASCULAR CATHETERIZATION N/A 08/10/2015   Procedure: Thrombectomy;  Surgeon: Algernon Huxley, MD;  Location: [place] [facility];  Service: Cardiovascular;  Laterality: N/A;  . PERIPHERAL VASCULAR CATHETERIZATION N/A 08/10/2015   Procedure: A/V Shuntogram/Fistulagram;  Surgeon: Algernon Huxley, MD;  Location: [place] [facility];  Service: Cardiovascular;  Laterality: N/A;  . PERIPHERAL VASCULAR CATHETERIZATION N/A 08/10/2015   Procedure: A/V Shunt Intervention;  Surgeon: Algernon Huxley, MD;  Location: [place] [facility];  Service: Cardiovascular;  Laterality: N/A;  . RENAL SHUNTS      Social History Social History  Substance Use Topics  . Smoking status: Never Smoker  . Smokeless tobacco: Never Used  . Alcohol use No  Family History No family history on file. No family history of bleeding/clotting disorders, porphyria or autoimmune disease   Allergies  Allergen Reactions  . Codeine Sulfate Nausea Only  . Gadolinium Derivatives   . Penicillins Other (See Comments)     REVIEW OF SYSTEMS (Negative unless checked)  Constitutional: [] Weight loss  [] Fever  [] Chills Cardiac: [] Chest pain   [] Chest pressure    [] Palpitations   [] Shortness of breath when laying flat   [] Shortness of breath with exertion. Vascular:  [] Pain in legs with walking   [] Pain in legs at rest  [] History of DVT   [] Phlebitis   [] Swelling in legs   [] Varicose veins   [] Non-healing ulcers Pulmonary:   [] Uses home oxygen   [] Productive cough   [] Hemoptysis   [] Wheeze  [] COPD   [] Asthma Neurologic:  [] Dizziness   [] Seizures   [] History of stroke   [] History of TIA  [] Aphasia   [] Vissual changes   [] Weakness or numbness in arm   [] Weakness or numbness in leg Musculoskeletal:   [] Joint swelling   [] Joint pain   [] Low back pain Hematologic:  [] Easy bruising  [] Easy bleeding   [] Hypercoagulable state   [] Anemic Gastrointestinal:  [] Diarrhea   [] Vomiting  [] Gastroesophageal reflux/heartburn   [] Difficulty swallowing. Genitourinary:  [] Chronic kidney disease   [] Difficult urination  [] Frequent urination   [] Blood in urine Skin:  [] Rashes   [] Ulcers  Psychological:  [] History of anxiety   []  History of major depression.  Physical Examination  Vitals:   03/04/16 1036  BP: 134/69  Pulse: 75  Resp: 17  Weight: 182 lb (82.6 kg)  Height: 4\' 11"  (1.499 m)   Body mass index is 36.76 kg/m. Gen: WD/WN, NAD Head: [place]/AT, No temporalis wasting.  Ear/Nose/Throat: Hearing grossly intact, nares w/o erythema or drainage, poor dentition Eyes: PER, EOMI, sclera nonicteric.  Neck: Supple, no masses.  No bruit or JVD.  Pulmonary:  Good air movement, clear to auscultation bilaterally, no use of accessory muscles.  Cardiac: RRR, normal S1, S2, no Murmurs. Vascular: left arm AV graft good thrill good bruit Vessel Right Left  Radial Palpable Palpable  Ulnar Palpable Palpable  Brachial Palpable Palpable  Carotid Palpable Palpable  Gastrointestinal: soft, non-distended. No guarding/no peritoneal signs.  Musculoskeletal: M/S 5/5 throughout.  No deformity or atrophy.  Neurologic: CN 2-12 intact. Pain and light touch intact in extremities.   Symmetrical.  Speech is fluent. Motor exam as listed above. Psychiatric: Judgment intact, Mood & affect appropriate for pt's clinical situation. Dermatologic: No rashes or ulcers noted.  No changes consistent with cellulitis. Lymph : No Cervical lymphadenopathy, no lichenification or skin changes of chronic lymphedema.  CBC Lab Results  Component Value Date   WBC 8.3 09/26/2015   HGB 13.3 09/27/2015   HCT 39.0 09/27/2015   MCV 81.1 09/26/2015   PLT 135 (L) 09/26/2015    BMET    Component Value Date/Time   NA 137 09/27/2015 0900   NA 135 (L) 03/29/2013 1409   K 4.9 09/27/2015 0900   K 4.5 03/29/2013 1409   CL 99 (L) 09/26/2015 1517   CL 97 (L) 03/29/2013 1409   CO2 32 09/26/2015 1517   CO2 27 03/29/2013 1409   GLUCOSE 102 (H) 09/27/2015 0900   GLUCOSE 97 03/29/2013 1409   BUN 28 (H) 09/26/2015 1517   BUN 48 (H) 03/29/2013 1409   CREATININE 7.13 (H) 09/26/2015 1517   CREATININE 12.06 (H) 03/29/2013 1409   CALCIUM 8.5 (L) 09/26/2015 1517   CALCIUM  8.8 03/29/2013 1409   GFRNONAA 5 (L) 09/26/2015 1517   GFRNONAA 3 (L) 03/29/2013 1409   GFRAA 6 (L) 09/26/2015 1517   GFRAA 3 (L) 03/29/2013 1409   CrCl cannot be calculated (Patient's most recent lab result is older than the maximum 21 days allowed.).  COAG No results found for: INR, PROTIME  Radiology No results found.  Assessment/Plan 1. End stage renal disease ([place]) Recommend:  The patient is doing well and currently has adequate dialysis access. The patient's dialysis center is not reporting any access issues. Flow pattern is stable when compared to the prior ultrasound.  The patient should have a duplex ultrasound of the dialysis access in 6 months. The patient will follow-up with me in the office after each ultrasound   2. Complication of vascular access for dialysis, sequela See # 1  3. Superior vena cava compression syndrome Continue HERO graft  4. Hypertension secondary to other renal  disorders Continue antihypertensive medications as already ordered and reviewed, no changes at this time.  5. Mixed hyperlipidemia Continue statin as ordered and reviewed, no changes at this time    Hortencia Pilar, MD  03/17/2016 1:50 PM

## 2016-03-19 DIAGNOSIS — D631 Anemia in chronic kidney disease: Secondary | ICD-10-CM | POA: Diagnosis not present

## 2016-03-19 DIAGNOSIS — N2581 Secondary hyperparathyroidism of renal origin: Secondary | ICD-10-CM | POA: Diagnosis not present

## 2016-03-19 DIAGNOSIS — N186 End stage renal disease: Secondary | ICD-10-CM | POA: Diagnosis not present

## 2016-03-19 DIAGNOSIS — E119 Type 2 diabetes mellitus without complications: Secondary | ICD-10-CM | POA: Diagnosis not present

## 2016-03-21 DIAGNOSIS — D631 Anemia in chronic kidney disease: Secondary | ICD-10-CM | POA: Diagnosis not present

## 2016-03-21 DIAGNOSIS — N2581 Secondary hyperparathyroidism of renal origin: Secondary | ICD-10-CM | POA: Diagnosis not present

## 2016-03-21 DIAGNOSIS — N186 End stage renal disease: Secondary | ICD-10-CM | POA: Diagnosis not present

## 2016-03-21 DIAGNOSIS — E119 Type 2 diabetes mellitus without complications: Secondary | ICD-10-CM | POA: Diagnosis not present

## 2016-03-22 DIAGNOSIS — H40033 Anatomical narrow angle, bilateral: Secondary | ICD-10-CM | POA: Diagnosis not present

## 2016-03-23 DIAGNOSIS — N186 End stage renal disease: Secondary | ICD-10-CM | POA: Diagnosis not present

## 2016-03-23 DIAGNOSIS — E119 Type 2 diabetes mellitus without complications: Secondary | ICD-10-CM | POA: Diagnosis not present

## 2016-03-23 DIAGNOSIS — D631 Anemia in chronic kidney disease: Secondary | ICD-10-CM | POA: Diagnosis not present

## 2016-03-23 DIAGNOSIS — N2581 Secondary hyperparathyroidism of renal origin: Secondary | ICD-10-CM | POA: Diagnosis not present

## 2016-03-25 DIAGNOSIS — N186 End stage renal disease: Secondary | ICD-10-CM | POA: Diagnosis not present

## 2016-03-25 DIAGNOSIS — E119 Type 2 diabetes mellitus without complications: Secondary | ICD-10-CM | POA: Diagnosis not present

## 2016-03-25 DIAGNOSIS — D631 Anemia in chronic kidney disease: Secondary | ICD-10-CM | POA: Diagnosis not present

## 2016-03-25 DIAGNOSIS — N2581 Secondary hyperparathyroidism of renal origin: Secondary | ICD-10-CM | POA: Diagnosis not present

## 2016-03-27 DIAGNOSIS — N186 End stage renal disease: Secondary | ICD-10-CM | POA: Diagnosis not present

## 2016-03-27 DIAGNOSIS — E119 Type 2 diabetes mellitus without complications: Secondary | ICD-10-CM | POA: Diagnosis not present

## 2016-03-27 DIAGNOSIS — D631 Anemia in chronic kidney disease: Secondary | ICD-10-CM | POA: Diagnosis not present

## 2016-03-27 DIAGNOSIS — N2581 Secondary hyperparathyroidism of renal origin: Secondary | ICD-10-CM | POA: Diagnosis not present

## 2016-03-30 DIAGNOSIS — E119 Type 2 diabetes mellitus without complications: Secondary | ICD-10-CM | POA: Diagnosis not present

## 2016-03-30 DIAGNOSIS — D631 Anemia in chronic kidney disease: Secondary | ICD-10-CM | POA: Diagnosis not present

## 2016-03-30 DIAGNOSIS — N186 End stage renal disease: Secondary | ICD-10-CM | POA: Diagnosis not present

## 2016-03-30 DIAGNOSIS — N2581 Secondary hyperparathyroidism of renal origin: Secondary | ICD-10-CM | POA: Diagnosis not present

## 2016-04-02 DIAGNOSIS — D631 Anemia in chronic kidney disease: Secondary | ICD-10-CM | POA: Diagnosis not present

## 2016-04-02 DIAGNOSIS — N2581 Secondary hyperparathyroidism of renal origin: Secondary | ICD-10-CM | POA: Diagnosis not present

## 2016-04-02 DIAGNOSIS — N186 End stage renal disease: Secondary | ICD-10-CM | POA: Diagnosis not present

## 2016-04-02 DIAGNOSIS — E119 Type 2 diabetes mellitus without complications: Secondary | ICD-10-CM | POA: Diagnosis not present

## 2016-04-04 DIAGNOSIS — E119 Type 2 diabetes mellitus without complications: Secondary | ICD-10-CM | POA: Diagnosis not present

## 2016-04-04 DIAGNOSIS — N186 End stage renal disease: Secondary | ICD-10-CM | POA: Diagnosis not present

## 2016-04-04 DIAGNOSIS — E1122 Type 2 diabetes mellitus with diabetic chronic kidney disease: Secondary | ICD-10-CM | POA: Diagnosis not present

## 2016-04-04 DIAGNOSIS — D631 Anemia in chronic kidney disease: Secondary | ICD-10-CM | POA: Diagnosis not present

## 2016-04-04 DIAGNOSIS — N2581 Secondary hyperparathyroidism of renal origin: Secondary | ICD-10-CM | POA: Diagnosis not present

## 2016-04-04 DIAGNOSIS — Z992 Dependence on renal dialysis: Secondary | ICD-10-CM | POA: Diagnosis not present

## 2016-04-06 DIAGNOSIS — D631 Anemia in chronic kidney disease: Secondary | ICD-10-CM | POA: Diagnosis not present

## 2016-04-06 DIAGNOSIS — N2581 Secondary hyperparathyroidism of renal origin: Secondary | ICD-10-CM | POA: Diagnosis not present

## 2016-04-06 DIAGNOSIS — E119 Type 2 diabetes mellitus without complications: Secondary | ICD-10-CM | POA: Diagnosis not present

## 2016-04-06 DIAGNOSIS — N186 End stage renal disease: Secondary | ICD-10-CM | POA: Diagnosis not present

## 2016-04-09 DIAGNOSIS — D631 Anemia in chronic kidney disease: Secondary | ICD-10-CM | POA: Diagnosis not present

## 2016-04-09 DIAGNOSIS — N186 End stage renal disease: Secondary | ICD-10-CM | POA: Diagnosis not present

## 2016-04-09 DIAGNOSIS — N2581 Secondary hyperparathyroidism of renal origin: Secondary | ICD-10-CM | POA: Diagnosis not present

## 2016-04-09 DIAGNOSIS — E119 Type 2 diabetes mellitus without complications: Secondary | ICD-10-CM | POA: Diagnosis not present

## 2016-04-11 DIAGNOSIS — D631 Anemia in chronic kidney disease: Secondary | ICD-10-CM | POA: Diagnosis not present

## 2016-04-11 DIAGNOSIS — E119 Type 2 diabetes mellitus without complications: Secondary | ICD-10-CM | POA: Diagnosis not present

## 2016-04-11 DIAGNOSIS — N186 End stage renal disease: Secondary | ICD-10-CM | POA: Diagnosis not present

## 2016-04-11 DIAGNOSIS — N2581 Secondary hyperparathyroidism of renal origin: Secondary | ICD-10-CM | POA: Diagnosis not present

## 2016-04-13 DIAGNOSIS — N2581 Secondary hyperparathyroidism of renal origin: Secondary | ICD-10-CM | POA: Diagnosis not present

## 2016-04-13 DIAGNOSIS — E119 Type 2 diabetes mellitus without complications: Secondary | ICD-10-CM | POA: Diagnosis not present

## 2016-04-13 DIAGNOSIS — D631 Anemia in chronic kidney disease: Secondary | ICD-10-CM | POA: Diagnosis not present

## 2016-04-13 DIAGNOSIS — N186 End stage renal disease: Secondary | ICD-10-CM | POA: Diagnosis not present

## 2016-04-16 DIAGNOSIS — E119 Type 2 diabetes mellitus without complications: Secondary | ICD-10-CM | POA: Diagnosis not present

## 2016-04-16 DIAGNOSIS — N2581 Secondary hyperparathyroidism of renal origin: Secondary | ICD-10-CM | POA: Diagnosis not present

## 2016-04-16 DIAGNOSIS — N186 End stage renal disease: Secondary | ICD-10-CM | POA: Diagnosis not present

## 2016-04-16 DIAGNOSIS — D631 Anemia in chronic kidney disease: Secondary | ICD-10-CM | POA: Diagnosis not present

## 2016-04-18 DIAGNOSIS — N2581 Secondary hyperparathyroidism of renal origin: Secondary | ICD-10-CM | POA: Diagnosis not present

## 2016-04-18 DIAGNOSIS — E119 Type 2 diabetes mellitus without complications: Secondary | ICD-10-CM | POA: Diagnosis not present

## 2016-04-18 DIAGNOSIS — D631 Anemia in chronic kidney disease: Secondary | ICD-10-CM | POA: Diagnosis not present

## 2016-04-18 DIAGNOSIS — N186 End stage renal disease: Secondary | ICD-10-CM | POA: Diagnosis not present

## 2016-04-20 DIAGNOSIS — D631 Anemia in chronic kidney disease: Secondary | ICD-10-CM | POA: Diagnosis not present

## 2016-04-20 DIAGNOSIS — N2581 Secondary hyperparathyroidism of renal origin: Secondary | ICD-10-CM | POA: Diagnosis not present

## 2016-04-20 DIAGNOSIS — E119 Type 2 diabetes mellitus without complications: Secondary | ICD-10-CM | POA: Diagnosis not present

## 2016-04-20 DIAGNOSIS — N186 End stage renal disease: Secondary | ICD-10-CM | POA: Diagnosis not present

## 2016-04-23 DIAGNOSIS — E119 Type 2 diabetes mellitus without complications: Secondary | ICD-10-CM | POA: Diagnosis not present

## 2016-04-23 DIAGNOSIS — D631 Anemia in chronic kidney disease: Secondary | ICD-10-CM | POA: Diagnosis not present

## 2016-04-23 DIAGNOSIS — N2581 Secondary hyperparathyroidism of renal origin: Secondary | ICD-10-CM | POA: Diagnosis not present

## 2016-04-23 DIAGNOSIS — N186 End stage renal disease: Secondary | ICD-10-CM | POA: Diagnosis not present

## 2016-04-25 DIAGNOSIS — D631 Anemia in chronic kidney disease: Secondary | ICD-10-CM | POA: Diagnosis not present

## 2016-04-25 DIAGNOSIS — N186 End stage renal disease: Secondary | ICD-10-CM | POA: Diagnosis not present

## 2016-04-25 DIAGNOSIS — E119 Type 2 diabetes mellitus without complications: Secondary | ICD-10-CM | POA: Diagnosis not present

## 2016-04-25 DIAGNOSIS — N2581 Secondary hyperparathyroidism of renal origin: Secondary | ICD-10-CM | POA: Diagnosis not present

## 2016-04-27 DIAGNOSIS — D631 Anemia in chronic kidney disease: Secondary | ICD-10-CM | POA: Diagnosis not present

## 2016-04-27 DIAGNOSIS — E119 Type 2 diabetes mellitus without complications: Secondary | ICD-10-CM | POA: Diagnosis not present

## 2016-04-27 DIAGNOSIS — N2581 Secondary hyperparathyroidism of renal origin: Secondary | ICD-10-CM | POA: Diagnosis not present

## 2016-04-27 DIAGNOSIS — N186 End stage renal disease: Secondary | ICD-10-CM | POA: Diagnosis not present

## 2016-04-30 DIAGNOSIS — N2581 Secondary hyperparathyroidism of renal origin: Secondary | ICD-10-CM | POA: Diagnosis not present

## 2016-04-30 DIAGNOSIS — E119 Type 2 diabetes mellitus without complications: Secondary | ICD-10-CM | POA: Diagnosis not present

## 2016-04-30 DIAGNOSIS — D631 Anemia in chronic kidney disease: Secondary | ICD-10-CM | POA: Diagnosis not present

## 2016-04-30 DIAGNOSIS — N186 End stage renal disease: Secondary | ICD-10-CM | POA: Diagnosis not present

## 2016-05-02 DIAGNOSIS — N186 End stage renal disease: Secondary | ICD-10-CM | POA: Diagnosis not present

## 2016-05-02 DIAGNOSIS — D631 Anemia in chronic kidney disease: Secondary | ICD-10-CM | POA: Diagnosis not present

## 2016-05-02 DIAGNOSIS — E119 Type 2 diabetes mellitus without complications: Secondary | ICD-10-CM | POA: Diagnosis not present

## 2016-05-02 DIAGNOSIS — N2581 Secondary hyperparathyroidism of renal origin: Secondary | ICD-10-CM | POA: Diagnosis not present

## 2016-05-04 DIAGNOSIS — N2581 Secondary hyperparathyroidism of renal origin: Secondary | ICD-10-CM | POA: Diagnosis not present

## 2016-05-04 DIAGNOSIS — D631 Anemia in chronic kidney disease: Secondary | ICD-10-CM | POA: Diagnosis not present

## 2016-05-04 DIAGNOSIS — N186 End stage renal disease: Secondary | ICD-10-CM | POA: Diagnosis not present

## 2016-05-04 DIAGNOSIS — E119 Type 2 diabetes mellitus without complications: Secondary | ICD-10-CM | POA: Diagnosis not present

## 2016-05-05 DIAGNOSIS — E1122 Type 2 diabetes mellitus with diabetic chronic kidney disease: Secondary | ICD-10-CM | POA: Diagnosis not present

## 2016-05-05 DIAGNOSIS — N186 End stage renal disease: Secondary | ICD-10-CM | POA: Diagnosis not present

## 2016-05-05 DIAGNOSIS — Z992 Dependence on renal dialysis: Secondary | ICD-10-CM | POA: Diagnosis not present

## 2016-05-06 HISTORY — PX: HEMIARTHROPLASTY HIP: SUR652

## 2016-05-07 DIAGNOSIS — N2581 Secondary hyperparathyroidism of renal origin: Secondary | ICD-10-CM | POA: Diagnosis not present

## 2016-05-07 DIAGNOSIS — E119 Type 2 diabetes mellitus without complications: Secondary | ICD-10-CM | POA: Diagnosis not present

## 2016-05-07 DIAGNOSIS — D631 Anemia in chronic kidney disease: Secondary | ICD-10-CM | POA: Diagnosis not present

## 2016-05-07 DIAGNOSIS — N186 End stage renal disease: Secondary | ICD-10-CM | POA: Diagnosis not present

## 2016-05-09 DIAGNOSIS — D631 Anemia in chronic kidney disease: Secondary | ICD-10-CM | POA: Diagnosis not present

## 2016-05-09 DIAGNOSIS — E119 Type 2 diabetes mellitus without complications: Secondary | ICD-10-CM | POA: Diagnosis not present

## 2016-05-09 DIAGNOSIS — N186 End stage renal disease: Secondary | ICD-10-CM | POA: Diagnosis not present

## 2016-05-09 DIAGNOSIS — N2581 Secondary hyperparathyroidism of renal origin: Secondary | ICD-10-CM | POA: Diagnosis not present

## 2016-05-11 DIAGNOSIS — N2581 Secondary hyperparathyroidism of renal origin: Secondary | ICD-10-CM | POA: Diagnosis not present

## 2016-05-11 DIAGNOSIS — E119 Type 2 diabetes mellitus without complications: Secondary | ICD-10-CM | POA: Diagnosis not present

## 2016-05-11 DIAGNOSIS — D631 Anemia in chronic kidney disease: Secondary | ICD-10-CM | POA: Diagnosis not present

## 2016-05-11 DIAGNOSIS — N186 End stage renal disease: Secondary | ICD-10-CM | POA: Diagnosis not present

## 2016-05-14 DIAGNOSIS — E119 Type 2 diabetes mellitus without complications: Secondary | ICD-10-CM | POA: Diagnosis not present

## 2016-05-14 DIAGNOSIS — D631 Anemia in chronic kidney disease: Secondary | ICD-10-CM | POA: Diagnosis not present

## 2016-05-14 DIAGNOSIS — N2581 Secondary hyperparathyroidism of renal origin: Secondary | ICD-10-CM | POA: Diagnosis not present

## 2016-05-14 DIAGNOSIS — N186 End stage renal disease: Secondary | ICD-10-CM | POA: Diagnosis not present

## 2016-05-16 DIAGNOSIS — N2581 Secondary hyperparathyroidism of renal origin: Secondary | ICD-10-CM | POA: Diagnosis not present

## 2016-05-16 DIAGNOSIS — N186 End stage renal disease: Secondary | ICD-10-CM | POA: Diagnosis not present

## 2016-05-16 DIAGNOSIS — D631 Anemia in chronic kidney disease: Secondary | ICD-10-CM | POA: Diagnosis not present

## 2016-05-16 DIAGNOSIS — E119 Type 2 diabetes mellitus without complications: Secondary | ICD-10-CM | POA: Diagnosis not present

## 2016-05-18 DIAGNOSIS — N186 End stage renal disease: Secondary | ICD-10-CM | POA: Diagnosis not present

## 2016-05-18 DIAGNOSIS — D631 Anemia in chronic kidney disease: Secondary | ICD-10-CM | POA: Diagnosis not present

## 2016-05-18 DIAGNOSIS — N2581 Secondary hyperparathyroidism of renal origin: Secondary | ICD-10-CM | POA: Diagnosis not present

## 2016-05-18 DIAGNOSIS — E119 Type 2 diabetes mellitus without complications: Secondary | ICD-10-CM | POA: Diagnosis not present

## 2016-05-21 DIAGNOSIS — N2581 Secondary hyperparathyroidism of renal origin: Secondary | ICD-10-CM | POA: Diagnosis not present

## 2016-05-21 DIAGNOSIS — N186 End stage renal disease: Secondary | ICD-10-CM | POA: Diagnosis not present

## 2016-05-21 DIAGNOSIS — D631 Anemia in chronic kidney disease: Secondary | ICD-10-CM | POA: Diagnosis not present

## 2016-05-21 DIAGNOSIS — E119 Type 2 diabetes mellitus without complications: Secondary | ICD-10-CM | POA: Diagnosis not present

## 2016-05-24 DIAGNOSIS — N2581 Secondary hyperparathyroidism of renal origin: Secondary | ICD-10-CM | POA: Diagnosis not present

## 2016-05-24 DIAGNOSIS — D631 Anemia in chronic kidney disease: Secondary | ICD-10-CM | POA: Diagnosis not present

## 2016-05-24 DIAGNOSIS — E119 Type 2 diabetes mellitus without complications: Secondary | ICD-10-CM | POA: Diagnosis not present

## 2016-05-24 DIAGNOSIS — N186 End stage renal disease: Secondary | ICD-10-CM | POA: Diagnosis not present

## 2016-05-25 DIAGNOSIS — E119 Type 2 diabetes mellitus without complications: Secondary | ICD-10-CM | POA: Diagnosis not present

## 2016-05-25 DIAGNOSIS — N186 End stage renal disease: Secondary | ICD-10-CM | POA: Diagnosis not present

## 2016-05-25 DIAGNOSIS — D631 Anemia in chronic kidney disease: Secondary | ICD-10-CM | POA: Diagnosis not present

## 2016-05-25 DIAGNOSIS — N2581 Secondary hyperparathyroidism of renal origin: Secondary | ICD-10-CM | POA: Diagnosis not present

## 2016-05-27 DIAGNOSIS — R05 Cough: Secondary | ICD-10-CM | POA: Diagnosis not present

## 2016-05-27 DIAGNOSIS — K219 Gastro-esophageal reflux disease without esophagitis: Secondary | ICD-10-CM | POA: Diagnosis not present

## 2016-05-27 DIAGNOSIS — Z905 Acquired absence of kidney: Secondary | ICD-10-CM | POA: Diagnosis not present

## 2016-05-27 DIAGNOSIS — R0989 Other specified symptoms and signs involving the circulatory and respiratory systems: Secondary | ICD-10-CM | POA: Diagnosis not present

## 2016-05-27 DIAGNOSIS — Z91041 Radiographic dye allergy status: Secondary | ICD-10-CM | POA: Diagnosis not present

## 2016-05-27 DIAGNOSIS — J3489 Other specified disorders of nose and nasal sinuses: Secondary | ICD-10-CM | POA: Diagnosis not present

## 2016-05-27 DIAGNOSIS — Z885 Allergy status to narcotic agent status: Secondary | ICD-10-CM | POA: Diagnosis not present

## 2016-05-27 DIAGNOSIS — Z992 Dependence on renal dialysis: Secondary | ICD-10-CM | POA: Diagnosis not present

## 2016-05-27 DIAGNOSIS — Z85528 Personal history of other malignant neoplasm of kidney: Secondary | ICD-10-CM | POA: Diagnosis not present

## 2016-05-27 DIAGNOSIS — Z7982 Long term (current) use of aspirin: Secondary | ICD-10-CM | POA: Diagnosis not present

## 2016-05-27 DIAGNOSIS — I120 Hypertensive chronic kidney disease with stage 5 chronic kidney disease or end stage renal disease: Secondary | ICD-10-CM | POA: Diagnosis not present

## 2016-05-27 DIAGNOSIS — R067 Sneezing: Secondary | ICD-10-CM | POA: Diagnosis not present

## 2016-05-27 DIAGNOSIS — Z951 Presence of aortocoronary bypass graft: Secondary | ICD-10-CM | POA: Diagnosis not present

## 2016-05-27 DIAGNOSIS — G933 Postviral fatigue syndrome: Secondary | ICD-10-CM | POA: Diagnosis not present

## 2016-05-27 DIAGNOSIS — N186 End stage renal disease: Secondary | ICD-10-CM | POA: Diagnosis not present

## 2016-05-27 DIAGNOSIS — Z79899 Other long term (current) drug therapy: Secondary | ICD-10-CM | POA: Diagnosis not present

## 2016-05-27 DIAGNOSIS — Z880 Allergy status to penicillin: Secondary | ICD-10-CM | POA: Diagnosis not present

## 2016-05-27 DIAGNOSIS — E1122 Type 2 diabetes mellitus with diabetic chronic kidney disease: Secondary | ICD-10-CM | POA: Diagnosis not present

## 2016-05-27 DIAGNOSIS — I2510 Atherosclerotic heart disease of native coronary artery without angina pectoris: Secondary | ICD-10-CM | POA: Diagnosis not present

## 2016-05-28 DIAGNOSIS — N2581 Secondary hyperparathyroidism of renal origin: Secondary | ICD-10-CM | POA: Diagnosis not present

## 2016-05-28 DIAGNOSIS — N186 End stage renal disease: Secondary | ICD-10-CM | POA: Diagnosis not present

## 2016-05-28 DIAGNOSIS — E119 Type 2 diabetes mellitus without complications: Secondary | ICD-10-CM | POA: Diagnosis not present

## 2016-05-28 DIAGNOSIS — D631 Anemia in chronic kidney disease: Secondary | ICD-10-CM | POA: Diagnosis not present

## 2016-05-30 DIAGNOSIS — N186 End stage renal disease: Secondary | ICD-10-CM | POA: Diagnosis not present

## 2016-05-30 DIAGNOSIS — D631 Anemia in chronic kidney disease: Secondary | ICD-10-CM | POA: Diagnosis not present

## 2016-05-30 DIAGNOSIS — E119 Type 2 diabetes mellitus without complications: Secondary | ICD-10-CM | POA: Diagnosis not present

## 2016-05-30 DIAGNOSIS — N2581 Secondary hyperparathyroidism of renal origin: Secondary | ICD-10-CM | POA: Diagnosis not present

## 2016-06-01 DIAGNOSIS — N2581 Secondary hyperparathyroidism of renal origin: Secondary | ICD-10-CM | POA: Diagnosis not present

## 2016-06-01 DIAGNOSIS — N186 End stage renal disease: Secondary | ICD-10-CM | POA: Diagnosis not present

## 2016-06-01 DIAGNOSIS — D631 Anemia in chronic kidney disease: Secondary | ICD-10-CM | POA: Diagnosis not present

## 2016-06-01 DIAGNOSIS — E119 Type 2 diabetes mellitus without complications: Secondary | ICD-10-CM | POA: Diagnosis not present

## 2016-06-04 DIAGNOSIS — N2581 Secondary hyperparathyroidism of renal origin: Secondary | ICD-10-CM | POA: Diagnosis not present

## 2016-06-04 DIAGNOSIS — E119 Type 2 diabetes mellitus without complications: Secondary | ICD-10-CM | POA: Diagnosis not present

## 2016-06-04 DIAGNOSIS — D631 Anemia in chronic kidney disease: Secondary | ICD-10-CM | POA: Diagnosis not present

## 2016-06-04 DIAGNOSIS — N186 End stage renal disease: Secondary | ICD-10-CM | POA: Diagnosis not present

## 2016-06-05 DIAGNOSIS — Z992 Dependence on renal dialysis: Secondary | ICD-10-CM | POA: Diagnosis not present

## 2016-06-05 DIAGNOSIS — N186 End stage renal disease: Secondary | ICD-10-CM | POA: Diagnosis not present

## 2016-06-05 DIAGNOSIS — E1122 Type 2 diabetes mellitus with diabetic chronic kidney disease: Secondary | ICD-10-CM | POA: Diagnosis not present

## 2016-06-06 DIAGNOSIS — D631 Anemia in chronic kidney disease: Secondary | ICD-10-CM | POA: Diagnosis not present

## 2016-06-06 DIAGNOSIS — N186 End stage renal disease: Secondary | ICD-10-CM | POA: Diagnosis not present

## 2016-06-06 DIAGNOSIS — E119 Type 2 diabetes mellitus without complications: Secondary | ICD-10-CM | POA: Diagnosis not present

## 2016-06-06 DIAGNOSIS — N2581 Secondary hyperparathyroidism of renal origin: Secondary | ICD-10-CM | POA: Diagnosis not present

## 2016-06-08 DIAGNOSIS — N2581 Secondary hyperparathyroidism of renal origin: Secondary | ICD-10-CM | POA: Diagnosis not present

## 2016-06-08 DIAGNOSIS — D631 Anemia in chronic kidney disease: Secondary | ICD-10-CM | POA: Diagnosis not present

## 2016-06-08 DIAGNOSIS — E119 Type 2 diabetes mellitus without complications: Secondary | ICD-10-CM | POA: Diagnosis not present

## 2016-06-08 DIAGNOSIS — N186 End stage renal disease: Secondary | ICD-10-CM | POA: Diagnosis not present

## 2016-06-11 DIAGNOSIS — D631 Anemia in chronic kidney disease: Secondary | ICD-10-CM | POA: Diagnosis not present

## 2016-06-11 DIAGNOSIS — N186 End stage renal disease: Secondary | ICD-10-CM | POA: Diagnosis not present

## 2016-06-11 DIAGNOSIS — G5602 Carpal tunnel syndrome, left upper limb: Secondary | ICD-10-CM | POA: Diagnosis not present

## 2016-06-11 DIAGNOSIS — E119 Type 2 diabetes mellitus without complications: Secondary | ICD-10-CM | POA: Diagnosis not present

## 2016-06-11 DIAGNOSIS — N2581 Secondary hyperparathyroidism of renal origin: Secondary | ICD-10-CM | POA: Diagnosis not present

## 2016-06-13 DIAGNOSIS — N186 End stage renal disease: Secondary | ICD-10-CM | POA: Diagnosis not present

## 2016-06-13 DIAGNOSIS — D631 Anemia in chronic kidney disease: Secondary | ICD-10-CM | POA: Diagnosis not present

## 2016-06-13 DIAGNOSIS — N2581 Secondary hyperparathyroidism of renal origin: Secondary | ICD-10-CM | POA: Diagnosis not present

## 2016-06-13 DIAGNOSIS — E119 Type 2 diabetes mellitus without complications: Secondary | ICD-10-CM | POA: Diagnosis not present

## 2016-06-15 DIAGNOSIS — D631 Anemia in chronic kidney disease: Secondary | ICD-10-CM | POA: Diagnosis not present

## 2016-06-15 DIAGNOSIS — N2581 Secondary hyperparathyroidism of renal origin: Secondary | ICD-10-CM | POA: Diagnosis not present

## 2016-06-15 DIAGNOSIS — E119 Type 2 diabetes mellitus without complications: Secondary | ICD-10-CM | POA: Diagnosis not present

## 2016-06-15 DIAGNOSIS — N186 End stage renal disease: Secondary | ICD-10-CM | POA: Diagnosis not present

## 2016-06-18 DIAGNOSIS — N186 End stage renal disease: Secondary | ICD-10-CM | POA: Diagnosis not present

## 2016-06-18 DIAGNOSIS — N2581 Secondary hyperparathyroidism of renal origin: Secondary | ICD-10-CM | POA: Diagnosis not present

## 2016-06-18 DIAGNOSIS — D631 Anemia in chronic kidney disease: Secondary | ICD-10-CM | POA: Diagnosis not present

## 2016-06-18 DIAGNOSIS — E119 Type 2 diabetes mellitus without complications: Secondary | ICD-10-CM | POA: Diagnosis not present

## 2016-06-20 DIAGNOSIS — N2581 Secondary hyperparathyroidism of renal origin: Secondary | ICD-10-CM | POA: Diagnosis not present

## 2016-06-20 DIAGNOSIS — D631 Anemia in chronic kidney disease: Secondary | ICD-10-CM | POA: Diagnosis not present

## 2016-06-20 DIAGNOSIS — E119 Type 2 diabetes mellitus without complications: Secondary | ICD-10-CM | POA: Diagnosis not present

## 2016-06-20 DIAGNOSIS — N186 End stage renal disease: Secondary | ICD-10-CM | POA: Diagnosis not present

## 2016-06-22 DIAGNOSIS — D631 Anemia in chronic kidney disease: Secondary | ICD-10-CM | POA: Diagnosis not present

## 2016-06-22 DIAGNOSIS — N186 End stage renal disease: Secondary | ICD-10-CM | POA: Diagnosis not present

## 2016-06-22 DIAGNOSIS — N2581 Secondary hyperparathyroidism of renal origin: Secondary | ICD-10-CM | POA: Diagnosis not present

## 2016-06-22 DIAGNOSIS — E119 Type 2 diabetes mellitus without complications: Secondary | ICD-10-CM | POA: Diagnosis not present

## 2016-06-25 DIAGNOSIS — E119 Type 2 diabetes mellitus without complications: Secondary | ICD-10-CM | POA: Diagnosis not present

## 2016-06-25 DIAGNOSIS — D631 Anemia in chronic kidney disease: Secondary | ICD-10-CM | POA: Diagnosis not present

## 2016-06-25 DIAGNOSIS — N2581 Secondary hyperparathyroidism of renal origin: Secondary | ICD-10-CM | POA: Diagnosis not present

## 2016-06-25 DIAGNOSIS — N186 End stage renal disease: Secondary | ICD-10-CM | POA: Diagnosis not present

## 2016-06-27 DIAGNOSIS — N186 End stage renal disease: Secondary | ICD-10-CM | POA: Diagnosis not present

## 2016-06-27 DIAGNOSIS — N2581 Secondary hyperparathyroidism of renal origin: Secondary | ICD-10-CM | POA: Diagnosis not present

## 2016-06-27 DIAGNOSIS — D631 Anemia in chronic kidney disease: Secondary | ICD-10-CM | POA: Diagnosis not present

## 2016-06-27 DIAGNOSIS — E119 Type 2 diabetes mellitus without complications: Secondary | ICD-10-CM | POA: Diagnosis not present

## 2016-06-29 DIAGNOSIS — E119 Type 2 diabetes mellitus without complications: Secondary | ICD-10-CM | POA: Diagnosis not present

## 2016-06-29 DIAGNOSIS — N2581 Secondary hyperparathyroidism of renal origin: Secondary | ICD-10-CM | POA: Diagnosis not present

## 2016-06-29 DIAGNOSIS — N186 End stage renal disease: Secondary | ICD-10-CM | POA: Diagnosis not present

## 2016-06-29 DIAGNOSIS — D631 Anemia in chronic kidney disease: Secondary | ICD-10-CM | POA: Diagnosis not present

## 2016-07-02 DIAGNOSIS — D631 Anemia in chronic kidney disease: Secondary | ICD-10-CM | POA: Diagnosis not present

## 2016-07-02 DIAGNOSIS — E119 Type 2 diabetes mellitus without complications: Secondary | ICD-10-CM | POA: Diagnosis not present

## 2016-07-02 DIAGNOSIS — N2581 Secondary hyperparathyroidism of renal origin: Secondary | ICD-10-CM | POA: Diagnosis not present

## 2016-07-02 DIAGNOSIS — N186 End stage renal disease: Secondary | ICD-10-CM | POA: Diagnosis not present

## 2016-07-03 DIAGNOSIS — E1122 Type 2 diabetes mellitus with diabetic chronic kidney disease: Secondary | ICD-10-CM | POA: Diagnosis not present

## 2016-07-03 DIAGNOSIS — N186 End stage renal disease: Secondary | ICD-10-CM | POA: Diagnosis not present

## 2016-07-03 DIAGNOSIS — Z992 Dependence on renal dialysis: Secondary | ICD-10-CM | POA: Diagnosis not present

## 2016-07-04 DIAGNOSIS — N2581 Secondary hyperparathyroidism of renal origin: Secondary | ICD-10-CM | POA: Diagnosis not present

## 2016-07-04 DIAGNOSIS — E119 Type 2 diabetes mellitus without complications: Secondary | ICD-10-CM | POA: Diagnosis not present

## 2016-07-04 DIAGNOSIS — D631 Anemia in chronic kidney disease: Secondary | ICD-10-CM | POA: Diagnosis not present

## 2016-07-04 DIAGNOSIS — N186 End stage renal disease: Secondary | ICD-10-CM | POA: Diagnosis not present

## 2016-07-06 DIAGNOSIS — E119 Type 2 diabetes mellitus without complications: Secondary | ICD-10-CM | POA: Diagnosis not present

## 2016-07-06 DIAGNOSIS — D631 Anemia in chronic kidney disease: Secondary | ICD-10-CM | POA: Diagnosis not present

## 2016-07-06 DIAGNOSIS — N186 End stage renal disease: Secondary | ICD-10-CM | POA: Diagnosis not present

## 2016-07-06 DIAGNOSIS — N2581 Secondary hyperparathyroidism of renal origin: Secondary | ICD-10-CM | POA: Diagnosis not present

## 2016-07-09 DIAGNOSIS — N2581 Secondary hyperparathyroidism of renal origin: Secondary | ICD-10-CM | POA: Diagnosis not present

## 2016-07-09 DIAGNOSIS — D631 Anemia in chronic kidney disease: Secondary | ICD-10-CM | POA: Diagnosis not present

## 2016-07-09 DIAGNOSIS — E119 Type 2 diabetes mellitus without complications: Secondary | ICD-10-CM | POA: Diagnosis not present

## 2016-07-09 DIAGNOSIS — N186 End stage renal disease: Secondary | ICD-10-CM | POA: Diagnosis not present

## 2016-07-11 DIAGNOSIS — E119 Type 2 diabetes mellitus without complications: Secondary | ICD-10-CM | POA: Diagnosis not present

## 2016-07-11 DIAGNOSIS — D631 Anemia in chronic kidney disease: Secondary | ICD-10-CM | POA: Diagnosis not present

## 2016-07-11 DIAGNOSIS — N2581 Secondary hyperparathyroidism of renal origin: Secondary | ICD-10-CM | POA: Diagnosis not present

## 2016-07-11 DIAGNOSIS — N186 End stage renal disease: Secondary | ICD-10-CM | POA: Diagnosis not present

## 2016-07-13 DIAGNOSIS — N2581 Secondary hyperparathyroidism of renal origin: Secondary | ICD-10-CM | POA: Diagnosis not present

## 2016-07-13 DIAGNOSIS — N186 End stage renal disease: Secondary | ICD-10-CM | POA: Diagnosis not present

## 2016-07-13 DIAGNOSIS — D631 Anemia in chronic kidney disease: Secondary | ICD-10-CM | POA: Diagnosis not present

## 2016-07-13 DIAGNOSIS — E119 Type 2 diabetes mellitus without complications: Secondary | ICD-10-CM | POA: Diagnosis not present

## 2016-07-16 DIAGNOSIS — N2581 Secondary hyperparathyroidism of renal origin: Secondary | ICD-10-CM | POA: Diagnosis not present

## 2016-07-16 DIAGNOSIS — D631 Anemia in chronic kidney disease: Secondary | ICD-10-CM | POA: Diagnosis not present

## 2016-07-16 DIAGNOSIS — E119 Type 2 diabetes mellitus without complications: Secondary | ICD-10-CM | POA: Diagnosis not present

## 2016-07-16 DIAGNOSIS — N186 End stage renal disease: Secondary | ICD-10-CM | POA: Diagnosis not present

## 2016-07-18 DIAGNOSIS — N186 End stage renal disease: Secondary | ICD-10-CM | POA: Diagnosis not present

## 2016-07-18 DIAGNOSIS — N2581 Secondary hyperparathyroidism of renal origin: Secondary | ICD-10-CM | POA: Diagnosis not present

## 2016-07-18 DIAGNOSIS — D631 Anemia in chronic kidney disease: Secondary | ICD-10-CM | POA: Diagnosis not present

## 2016-07-18 DIAGNOSIS — E119 Type 2 diabetes mellitus without complications: Secondary | ICD-10-CM | POA: Diagnosis not present

## 2016-07-20 DIAGNOSIS — N2581 Secondary hyperparathyroidism of renal origin: Secondary | ICD-10-CM | POA: Diagnosis not present

## 2016-07-20 DIAGNOSIS — N186 End stage renal disease: Secondary | ICD-10-CM | POA: Diagnosis not present

## 2016-07-20 DIAGNOSIS — D631 Anemia in chronic kidney disease: Secondary | ICD-10-CM | POA: Diagnosis not present

## 2016-07-20 DIAGNOSIS — E119 Type 2 diabetes mellitus without complications: Secondary | ICD-10-CM | POA: Diagnosis not present

## 2016-07-23 DIAGNOSIS — E119 Type 2 diabetes mellitus without complications: Secondary | ICD-10-CM | POA: Diagnosis not present

## 2016-07-23 DIAGNOSIS — D631 Anemia in chronic kidney disease: Secondary | ICD-10-CM | POA: Diagnosis not present

## 2016-07-23 DIAGNOSIS — N186 End stage renal disease: Secondary | ICD-10-CM | POA: Diagnosis not present

## 2016-07-23 DIAGNOSIS — N2581 Secondary hyperparathyroidism of renal origin: Secondary | ICD-10-CM | POA: Diagnosis not present

## 2016-07-25 DIAGNOSIS — N2581 Secondary hyperparathyroidism of renal origin: Secondary | ICD-10-CM | POA: Diagnosis not present

## 2016-07-25 DIAGNOSIS — N186 End stage renal disease: Secondary | ICD-10-CM | POA: Diagnosis not present

## 2016-07-25 DIAGNOSIS — D631 Anemia in chronic kidney disease: Secondary | ICD-10-CM | POA: Diagnosis not present

## 2016-07-25 DIAGNOSIS — E119 Type 2 diabetes mellitus without complications: Secondary | ICD-10-CM | POA: Diagnosis not present

## 2016-07-27 DIAGNOSIS — D631 Anemia in chronic kidney disease: Secondary | ICD-10-CM | POA: Diagnosis not present

## 2016-07-27 DIAGNOSIS — N186 End stage renal disease: Secondary | ICD-10-CM | POA: Diagnosis not present

## 2016-07-27 DIAGNOSIS — E119 Type 2 diabetes mellitus without complications: Secondary | ICD-10-CM | POA: Diagnosis not present

## 2016-07-27 DIAGNOSIS — N2581 Secondary hyperparathyroidism of renal origin: Secondary | ICD-10-CM | POA: Diagnosis not present

## 2016-07-30 DIAGNOSIS — N186 End stage renal disease: Secondary | ICD-10-CM | POA: Diagnosis not present

## 2016-07-30 DIAGNOSIS — D631 Anemia in chronic kidney disease: Secondary | ICD-10-CM | POA: Diagnosis not present

## 2016-07-30 DIAGNOSIS — E119 Type 2 diabetes mellitus without complications: Secondary | ICD-10-CM | POA: Diagnosis not present

## 2016-07-30 DIAGNOSIS — N2581 Secondary hyperparathyroidism of renal origin: Secondary | ICD-10-CM | POA: Diagnosis not present

## 2016-08-01 DIAGNOSIS — D631 Anemia in chronic kidney disease: Secondary | ICD-10-CM | POA: Diagnosis not present

## 2016-08-01 DIAGNOSIS — N186 End stage renal disease: Secondary | ICD-10-CM | POA: Diagnosis not present

## 2016-08-01 DIAGNOSIS — N2581 Secondary hyperparathyroidism of renal origin: Secondary | ICD-10-CM | POA: Diagnosis not present

## 2016-08-01 DIAGNOSIS — E119 Type 2 diabetes mellitus without complications: Secondary | ICD-10-CM | POA: Diagnosis not present

## 2016-08-03 DIAGNOSIS — Z992 Dependence on renal dialysis: Secondary | ICD-10-CM | POA: Diagnosis not present

## 2016-08-03 DIAGNOSIS — N186 End stage renal disease: Secondary | ICD-10-CM | POA: Diagnosis not present

## 2016-08-03 DIAGNOSIS — E119 Type 2 diabetes mellitus without complications: Secondary | ICD-10-CM | POA: Diagnosis not present

## 2016-08-03 DIAGNOSIS — E1122 Type 2 diabetes mellitus with diabetic chronic kidney disease: Secondary | ICD-10-CM | POA: Diagnosis not present

## 2016-08-03 DIAGNOSIS — N2581 Secondary hyperparathyroidism of renal origin: Secondary | ICD-10-CM | POA: Diagnosis not present

## 2016-08-03 DIAGNOSIS — D631 Anemia in chronic kidney disease: Secondary | ICD-10-CM | POA: Diagnosis not present

## 2016-08-06 DIAGNOSIS — N186 End stage renal disease: Secondary | ICD-10-CM | POA: Diagnosis not present

## 2016-08-06 DIAGNOSIS — D631 Anemia in chronic kidney disease: Secondary | ICD-10-CM | POA: Diagnosis not present

## 2016-08-06 DIAGNOSIS — N2581 Secondary hyperparathyroidism of renal origin: Secondary | ICD-10-CM | POA: Diagnosis not present

## 2016-08-06 DIAGNOSIS — E119 Type 2 diabetes mellitus without complications: Secondary | ICD-10-CM | POA: Diagnosis not present

## 2016-08-08 DIAGNOSIS — D631 Anemia in chronic kidney disease: Secondary | ICD-10-CM | POA: Diagnosis not present

## 2016-08-08 DIAGNOSIS — N186 End stage renal disease: Secondary | ICD-10-CM | POA: Diagnosis not present

## 2016-08-08 DIAGNOSIS — N2581 Secondary hyperparathyroidism of renal origin: Secondary | ICD-10-CM | POA: Diagnosis not present

## 2016-08-08 DIAGNOSIS — E119 Type 2 diabetes mellitus without complications: Secondary | ICD-10-CM | POA: Diagnosis not present

## 2016-08-10 DIAGNOSIS — N186 End stage renal disease: Secondary | ICD-10-CM | POA: Diagnosis not present

## 2016-08-10 DIAGNOSIS — D631 Anemia in chronic kidney disease: Secondary | ICD-10-CM | POA: Diagnosis not present

## 2016-08-10 DIAGNOSIS — E119 Type 2 diabetes mellitus without complications: Secondary | ICD-10-CM | POA: Diagnosis not present

## 2016-08-10 DIAGNOSIS — N2581 Secondary hyperparathyroidism of renal origin: Secondary | ICD-10-CM | POA: Diagnosis not present

## 2016-08-13 DIAGNOSIS — E119 Type 2 diabetes mellitus without complications: Secondary | ICD-10-CM | POA: Diagnosis not present

## 2016-08-13 DIAGNOSIS — D631 Anemia in chronic kidney disease: Secondary | ICD-10-CM | POA: Diagnosis not present

## 2016-08-13 DIAGNOSIS — N186 End stage renal disease: Secondary | ICD-10-CM | POA: Diagnosis not present

## 2016-08-13 DIAGNOSIS — N2581 Secondary hyperparathyroidism of renal origin: Secondary | ICD-10-CM | POA: Diagnosis not present

## 2016-08-15 DIAGNOSIS — E119 Type 2 diabetes mellitus without complications: Secondary | ICD-10-CM | POA: Diagnosis not present

## 2016-08-15 DIAGNOSIS — D631 Anemia in chronic kidney disease: Secondary | ICD-10-CM | POA: Diagnosis not present

## 2016-08-15 DIAGNOSIS — N186 End stage renal disease: Secondary | ICD-10-CM | POA: Diagnosis not present

## 2016-08-15 DIAGNOSIS — N2581 Secondary hyperparathyroidism of renal origin: Secondary | ICD-10-CM | POA: Diagnosis not present

## 2016-08-17 DIAGNOSIS — N2581 Secondary hyperparathyroidism of renal origin: Secondary | ICD-10-CM | POA: Diagnosis not present

## 2016-08-17 DIAGNOSIS — N186 End stage renal disease: Secondary | ICD-10-CM | POA: Diagnosis not present

## 2016-08-17 DIAGNOSIS — D631 Anemia in chronic kidney disease: Secondary | ICD-10-CM | POA: Diagnosis not present

## 2016-08-17 DIAGNOSIS — E119 Type 2 diabetes mellitus without complications: Secondary | ICD-10-CM | POA: Diagnosis not present

## 2016-08-20 DIAGNOSIS — N186 End stage renal disease: Secondary | ICD-10-CM | POA: Diagnosis not present

## 2016-08-20 DIAGNOSIS — D631 Anemia in chronic kidney disease: Secondary | ICD-10-CM | POA: Diagnosis not present

## 2016-08-20 DIAGNOSIS — N2581 Secondary hyperparathyroidism of renal origin: Secondary | ICD-10-CM | POA: Diagnosis not present

## 2016-08-20 DIAGNOSIS — E119 Type 2 diabetes mellitus without complications: Secondary | ICD-10-CM | POA: Diagnosis not present

## 2016-08-21 ENCOUNTER — Ambulatory Visit (INDEPENDENT_AMBULATORY_CARE_PROVIDER_SITE_OTHER): Payer: Medicare Other

## 2016-08-21 ENCOUNTER — Encounter (INDEPENDENT_AMBULATORY_CARE_PROVIDER_SITE_OTHER): Payer: Self-pay | Admitting: Vascular Surgery

## 2016-08-21 ENCOUNTER — Ambulatory Visit (INDEPENDENT_AMBULATORY_CARE_PROVIDER_SITE_OTHER): Payer: PRIVATE HEALTH INSURANCE | Admitting: Vascular Surgery

## 2016-08-21 VITALS — BP 123/68 | HR 72 | Resp 16 | Wt 168.0 lb

## 2016-08-21 DIAGNOSIS — N186 End stage renal disease: Secondary | ICD-10-CM

## 2016-08-21 DIAGNOSIS — I151 Hypertension secondary to other renal disorders: Secondary | ICD-10-CM | POA: Diagnosis not present

## 2016-08-21 DIAGNOSIS — E782 Mixed hyperlipidemia: Secondary | ICD-10-CM

## 2016-08-21 DIAGNOSIS — T829XXS Unspecified complication of cardiac and vascular prosthetic device, implant and graft, sequela: Secondary | ICD-10-CM

## 2016-08-21 DIAGNOSIS — N2889 Other specified disorders of kidney and ureter: Secondary | ICD-10-CM | POA: Diagnosis not present

## 2016-08-22 DIAGNOSIS — N2581 Secondary hyperparathyroidism of renal origin: Secondary | ICD-10-CM | POA: Diagnosis not present

## 2016-08-22 DIAGNOSIS — E119 Type 2 diabetes mellitus without complications: Secondary | ICD-10-CM | POA: Diagnosis not present

## 2016-08-22 DIAGNOSIS — N186 End stage renal disease: Secondary | ICD-10-CM | POA: Diagnosis not present

## 2016-08-22 DIAGNOSIS — D631 Anemia in chronic kidney disease: Secondary | ICD-10-CM | POA: Diagnosis not present

## 2016-08-23 DIAGNOSIS — H40033 Anatomical narrow angle, bilateral: Secondary | ICD-10-CM | POA: Diagnosis not present

## 2016-08-24 DIAGNOSIS — N2581 Secondary hyperparathyroidism of renal origin: Secondary | ICD-10-CM | POA: Diagnosis not present

## 2016-08-24 DIAGNOSIS — E119 Type 2 diabetes mellitus without complications: Secondary | ICD-10-CM | POA: Diagnosis not present

## 2016-08-24 DIAGNOSIS — N186 End stage renal disease: Secondary | ICD-10-CM | POA: Diagnosis not present

## 2016-08-24 DIAGNOSIS — D631 Anemia in chronic kidney disease: Secondary | ICD-10-CM | POA: Diagnosis not present

## 2016-08-27 DIAGNOSIS — N2581 Secondary hyperparathyroidism of renal origin: Secondary | ICD-10-CM | POA: Diagnosis not present

## 2016-08-27 DIAGNOSIS — N186 End stage renal disease: Secondary | ICD-10-CM | POA: Diagnosis not present

## 2016-08-27 DIAGNOSIS — D631 Anemia in chronic kidney disease: Secondary | ICD-10-CM | POA: Diagnosis not present

## 2016-08-27 DIAGNOSIS — E119 Type 2 diabetes mellitus without complications: Secondary | ICD-10-CM | POA: Diagnosis not present

## 2016-08-29 DIAGNOSIS — N2581 Secondary hyperparathyroidism of renal origin: Secondary | ICD-10-CM | POA: Diagnosis not present

## 2016-08-29 DIAGNOSIS — D631 Anemia in chronic kidney disease: Secondary | ICD-10-CM | POA: Diagnosis not present

## 2016-08-29 DIAGNOSIS — N186 End stage renal disease: Secondary | ICD-10-CM | POA: Diagnosis not present

## 2016-08-29 DIAGNOSIS — E119 Type 2 diabetes mellitus without complications: Secondary | ICD-10-CM | POA: Diagnosis not present

## 2016-08-31 DIAGNOSIS — E119 Type 2 diabetes mellitus without complications: Secondary | ICD-10-CM | POA: Diagnosis not present

## 2016-08-31 DIAGNOSIS — N2581 Secondary hyperparathyroidism of renal origin: Secondary | ICD-10-CM | POA: Diagnosis not present

## 2016-08-31 DIAGNOSIS — D631 Anemia in chronic kidney disease: Secondary | ICD-10-CM | POA: Diagnosis not present

## 2016-08-31 DIAGNOSIS — N186 End stage renal disease: Secondary | ICD-10-CM | POA: Diagnosis not present

## 2016-09-02 ENCOUNTER — Ambulatory Visit (INDEPENDENT_AMBULATORY_CARE_PROVIDER_SITE_OTHER): Payer: Medicare Other | Admitting: Vascular Surgery

## 2016-09-02 ENCOUNTER — Encounter (INDEPENDENT_AMBULATORY_CARE_PROVIDER_SITE_OTHER): Payer: Medicare Other

## 2016-09-02 DIAGNOSIS — Z992 Dependence on renal dialysis: Secondary | ICD-10-CM | POA: Diagnosis not present

## 2016-09-02 DIAGNOSIS — N186 End stage renal disease: Secondary | ICD-10-CM | POA: Diagnosis not present

## 2016-09-02 DIAGNOSIS — E1122 Type 2 diabetes mellitus with diabetic chronic kidney disease: Secondary | ICD-10-CM | POA: Diagnosis not present

## 2016-09-03 DIAGNOSIS — N2581 Secondary hyperparathyroidism of renal origin: Secondary | ICD-10-CM | POA: Diagnosis not present

## 2016-09-03 DIAGNOSIS — N186 End stage renal disease: Secondary | ICD-10-CM | POA: Diagnosis not present

## 2016-09-03 DIAGNOSIS — E119 Type 2 diabetes mellitus without complications: Secondary | ICD-10-CM | POA: Diagnosis not present

## 2016-09-03 DIAGNOSIS — D631 Anemia in chronic kidney disease: Secondary | ICD-10-CM | POA: Diagnosis not present

## 2016-09-04 ENCOUNTER — Ambulatory Visit (INDEPENDENT_AMBULATORY_CARE_PROVIDER_SITE_OTHER): Payer: Medicare Other | Admitting: Vascular Surgery

## 2016-09-04 ENCOUNTER — Encounter (INDEPENDENT_AMBULATORY_CARE_PROVIDER_SITE_OTHER): Payer: Self-pay | Admitting: Vascular Surgery

## 2016-09-04 VITALS — BP 124/67 | HR 66 | Resp 16 | Ht 59.0 in | Wt 168.0 lb

## 2016-09-04 DIAGNOSIS — N186 End stage renal disease: Secondary | ICD-10-CM

## 2016-09-04 DIAGNOSIS — N2889 Other specified disorders of kidney and ureter: Secondary | ICD-10-CM

## 2016-09-04 DIAGNOSIS — I151 Hypertension secondary to other renal disorders: Secondary | ICD-10-CM

## 2016-09-04 DIAGNOSIS — E782 Mixed hyperlipidemia: Secondary | ICD-10-CM | POA: Diagnosis not present

## 2016-09-04 NOTE — Progress Notes (Signed)
Subjective:    Patient ID: Kelli Nguyen, female    DOB: 1945-08-30, 71 y.o.   MRN: [id number] Chief Complaint  Patient presents with  . Re-evaluation    2 week Med check   Patient presents for a two week medication review. I started her on neurontin two weeks ago for what sounds like neuropathy in her bilateral hands. The patient did not like the side effects. She states she was unable to take the medication as it made her feel drunk. Patient to stop this medication. No signs of vascular compromise to the hands. Dialysis access working well.    Review of Systems  Constitutional: Negative.   HENT: Negative.   Eyes: Negative.   Respiratory: Negative.   Cardiovascular: Negative.   Gastrointestinal: Negative.   Endocrine: Negative.   Genitourinary:       ESRD  Musculoskeletal: Negative.   Skin: Negative.   Allergic/Immunologic: Negative.   Neurological:       Bilateral Hand Neuropathy  Hematological: Negative.   Psychiatric/Behavioral: Negative.       Objective:   Physical Exam  Constitutional: She is oriented to person, place, and time. She appears well-developed and well-nourished. No distress.  HENT:  Head: Normocephalic and atraumatic.  Eyes: Conjunctivae are normal. Pupils are equal, round, and reactive to light.  Neck: Normal range of motion.  Cardiovascular: Normal rate, regular rhythm, normal heart sounds and intact distal pulses.   Pulses:      Radial pulses are 2+ on the right side, and 2+ on the left side.  Pulmonary/Chest: [place] normal.  Musculoskeletal: Normal range of motion. She exhibits no edema.  Neurological: She is alert and oriented to person, place, and time.  Skin: Skin is warm and dry. She is not diaphoretic.  Psychiatric: She has a normal mood and affect. Her behavior is normal. Judgment and thought content normal.  Vitals reviewed.  BP 124/67 (BP Location: Right Arm)   Pulse 66   Resp 16   Ht 4\' 11"  (1.499 m)   Wt 168 lb (76.2 kg)   BMI  33.93 kg/m   Past Medical History:  Diagnosis Date  . Anemia   . Chronic cough   . Chronic kidney disease   . Coronary artery disease   . Diabetes mellitus without complication ([place])   . Dialysis patient ([place])    T-TH-SAT  . GERD (gastroesophageal reflux disease)   . Hypertension    Social History   Social History  . Marital status: Widowed    Spouse name: N/A  . Number of children: N/A  . Years of education: N/A   Occupational History  . Not on file.   Social History Main Topics  . Smoking status: Never Smoker  . Smokeless tobacco: Never Used  . Alcohol use No  . Drug use: No  . Sexual activity: Not on file   Other Topics Concern  . Not on file   Social History Narrative  . No narrative on file   Past Surgical History:  Procedure Laterality Date  . ABDOMINAL HYSTERECTOMY    . CARPAL TUNNEL RELEASE Left 09/27/2015   Procedure: CARPAL TUNNEL RELEASE;  Surgeon: Leanor Kail, MD;  Location: [facility] ORS;  Service: Orthopedics;  Laterality: Left;  . COLONOSCOPY    . COLONOSCOPY WITH PROPOFOL N/A 11/28/2014   Procedure: COLONOSCOPY WITH PROPOFOL;  Surgeon: Manya Silvas, MD;  Location: [facility] ENDOSCOPY;  Service: Endoscopy;  Laterality: N/A;  . CORONARY ARTERY BYPASS GRAFT    .  PERIPHERAL VASCULAR CATHETERIZATION Left 01/18/2015   Procedure: A/V Shuntogram/Fistulagram;  Surgeon: Katha Cabal, MD;  Location: [place] [facility];  Service: Cardiovascular;  Laterality: Left;  . PERIPHERAL VASCULAR CATHETERIZATION Left 01/18/2015   Procedure: A/V Shunt Intervention;  Surgeon: Katha Cabal, MD;  Location: [place] [facility];  Service: Cardiovascular;  Laterality: Left;  . PERIPHERAL VASCULAR CATHETERIZATION N/A 08/10/2015   Procedure: Thrombectomy;  Surgeon: Algernon Huxley, MD;  Location: [place] [facility];  Service: Cardiovascular;  Laterality: N/A;  . PERIPHERAL VASCULAR CATHETERIZATION N/A 08/10/2015   Procedure: A/V Shuntogram/Fistulagram;  Surgeon: Algernon Huxley,  MD;  Location: [place] [facility];  Service: Cardiovascular;  Laterality: N/A;  . PERIPHERAL VASCULAR CATHETERIZATION N/A 08/10/2015   Procedure: A/V Shunt Intervention;  Surgeon: Algernon Huxley, MD;  Location: [place] [facility];  Service: Cardiovascular;  Laterality: N/A;  . RENAL SHUNTS     No family history on file.  Allergies  Allergen Reactions  . Codeine Sulfate Nausea Only  . Gadobutrol     Other reaction(s): Other (See Comments)  . Gadolinium Derivatives   . Other     Other reaction(s): Unknown  . Penicillins Other (See Comments)  . Statins     Other reaction(s): Other (See Comments) muscle pain      Assessment & Plan:  Patient presents for a two week medication review. I started her on neurontin two weeks ago for what sounds like neuropathy in her bilateral hands. The patient did not like the side effects. She states she was unable to take the medication as it made her feel drunk. Patient to stop this medication. No signs of vascular compromise to the hands. Dialysis access working well.   1. Mixed hyperlipidemia - Stable Encouraged good control as its slows the progression of atherosclerotic disease  2. End stage renal disease ([place]) - Stable Patient to follow up in six moths for normal HDA follow up.  3. Hypertension secondary to other renal disorders - Stable Encouraged good control as its slows the progression of atherosclerotic disease  Current Outpatient Prescriptions on File Prior to Visit  Medication Sig Dispense Refill  . aspirin 81 MG tablet Take 81 mg by mouth daily.    . cinacalcet (SENSIPAR) 30 MG tablet Take 30 mg by mouth daily.    . isosorbide mononitrate (IMDUR) 30 MG 24 hr tablet Take 30 mg by mouth daily.    [name] latanoprost (XALATAN) 0.005 % ophthalmic solution     . metoprolol succinate (TOPROL-XL) 25 MG 24 hr tablet Take 25 mg by mouth daily.    . pravastatin (PRAVACHOL) 20 MG tablet Take 20 mg by mouth daily.    . sevelamer carbonate (RENVELA) 800  MG tablet Take 800 mg by mouth 3 (three) times daily with meals.    . timolol (TIMOPTIC) 0.5 % ophthalmic solution Administer 1 drop to both eyes daily.      No current facility-administered medications on file prior to visit.    There are no Patient Instructions on file for this visit. No Follow-up on file.  Nimisha Rathel A Allie Ousley, PA-C

## 2016-09-04 NOTE — Progress Notes (Signed)
Subjective:    Patient ID: Kelli Nguyen, female    DOB: September 01, 1945, 71 y.o.   MRN: [id number] Chief Complaint  Patient presents with  . Follow-up   Patient presents for six hemodialysis access follow up. The patient underwent a duplex ultrasound of the AV access which was notable for a patent fistula without any significant hemodynamic stenosis. The patient denies any issues with hemodialysis such as cannulation problems, increased bleeding, decrease in doppler flow or recirculation. The patient also denies any fistula skin breakdown, pain, edema, pallor or ulceration of the arm / hand. She continues to have numbness and tingling in her bilateral fingers.    Review of Systems  Constitutional: Negative.   HENT: Negative.   Eyes: Negative.   Respiratory: Negative.   Cardiovascular: Negative.   Gastrointestinal: Negative.   Endocrine: Negative.   Musculoskeletal: Negative.   Skin: Negative.   Allergic/Immunologic: Negative.   Neurological:       Numbness and tingling in patients bilateral fingers  Hematological: Negative.   Psychiatric/Behavioral: Negative.       Objective:   Physical Exam  Constitutional: She is oriented to person, place, and time. She appears well-developed and well-nourished. No distress.  HENT:  Head: Normocephalic and atraumatic.  Eyes: Conjunctivae are normal. Pupils are equal, round, and reactive to light.  Neck: Normal range of motion.  Cardiovascular: Normal rate, regular rhythm, normal heart sounds and intact distal pulses.   Pulses:      Radial pulses are 2+ on the right side, and 2+ on the left side.  Left Upper Extremity Graft good bruit and thrill  Pulmonary/Chest: Effort normal.  Musculoskeletal: Normal range of motion. She exhibits no edema.  Neurological: She is alert and oriented to person, place, and time.  Skin: Skin is warm and dry. She is not diaphoretic.  Psychiatric: She has a normal mood and affect. Her behavior is normal. Judgment  and thought content normal.  Vitals reviewed.  BP 123/68   Pulse 72   Resp 16   Wt 168 lb (76.2 kg)   BMI 33.93 kg/m   Past Medical History:  Diagnosis Date  . Anemia   . Chronic cough   . Chronic kidney disease   . Coronary artery disease   . Diabetes mellitus without complication ([place])   . Dialysis patient ([place])    T-TH-SAT  . GERD (gastroesophageal reflux disease)   . Hypertension    Social History   Social History  . Marital status: Widowed    Spouse name: N/A  . Number of children: N/A  . Years of education: N/A   Occupational History  . Not on file.   Social History Main Topics  . Smoking status: Never Smoker  . Smokeless tobacco: Never Used  . Alcohol use No  . Drug use: No  . Sexual activity: Not on file   Other Topics Concern  . Not on file   Social History Narrative  . No narrative on file   Past Surgical History:  Procedure Laterality Date  . ABDOMINAL HYSTERECTOMY    . CARPAL TUNNEL RELEASE Left 09/27/2015   Procedure: CARPAL TUNNEL RELEASE;  Surgeon: Leanor Kail, MD;  Location: [facility] ORS;  Service: Orthopedics;  Laterality: Left;  . COLONOSCOPY    . COLONOSCOPY WITH PROPOFOL N/A 11/28/2014   Procedure: COLONOSCOPY WITH PROPOFOL;  Surgeon: Manya Silvas, MD;  Location: [facility] ENDOSCOPY;  Service: Endoscopy;  Laterality: N/A;  . CORONARY ARTERY BYPASS GRAFT    . PERIPHERAL  VASCULAR CATHETERIZATION Left 01/18/2015   Procedure: A/V Shuntogram/Fistulagram;  Surgeon: Katha Cabal, MD;  Location: [place] [facility];  Service: Cardiovascular;  Laterality: Left;  . PERIPHERAL VASCULAR CATHETERIZATION Left 01/18/2015   Procedure: A/V Shunt Intervention;  Surgeon: Katha Cabal, MD;  Location: [place] [facility];  Service: Cardiovascular;  Laterality: Left;  . PERIPHERAL VASCULAR CATHETERIZATION N/A 08/10/2015   Procedure: Thrombectomy;  Surgeon: Algernon Huxley, MD;  Location: [place] [facility];  Service: Cardiovascular;  Laterality: N/A;  .  PERIPHERAL VASCULAR CATHETERIZATION N/A 08/10/2015   Procedure: A/V Shuntogram/Fistulagram;  Surgeon: Algernon Huxley, MD;  Location: [place] [facility];  Service: Cardiovascular;  Laterality: N/A;  . PERIPHERAL VASCULAR CATHETERIZATION N/A 08/10/2015   Procedure: A/V Shunt Intervention;  Surgeon: Algernon Huxley, MD;  Location: [place] [facility];  Service: Cardiovascular;  Laterality: N/A;  . RENAL SHUNTS     No family history on file.  Allergies  Allergen Reactions  . Codeine Sulfate Nausea Only  . Gadobutrol     Other reaction(s): Other (See Comments)  . Gadolinium Derivatives   . Other     Other reaction(s): Unknown  . Penicillins Other (See Comments)  . Statins     Other reaction(s): Other (See Comments) muscle pain      Assessment & Plan:  Patient presents for six hemodialysis access follow up. The patient underwent a duplex ultrasound of the AV access which was notable for a patent fistula without any significant hemodynamic stenosis. The patient denies any issues with hemodialysis such as cannulation problems, increased bleeding, decrease in doppler flow or recirculation. The patient also denies any fistula skin breakdown, pain, edema, pallor or ulceration of the arm / hand. She continues to have numbness and tingling in her bilateral fingers.   1. End stage renal disease ([place]) - Stable Studies reviewed with patient. The patient is doing well and currently has adequate dialysis access. Duplex ultrasound of the AV access shows a patent access with no evidence of hemodynamically significant strictures or stenosis.  The patient should continue to have duplex ultrasounds of the dialysis access every six months. The patient was instructed to call the office in the interim if any issues with dialysis access / doppler flow, pain, edema, pallor, fistula skin breakdown or ulceration of the arm / hand occur. The patient expressed their understanding. Will trial Neurontin as the patient has  what sounds like neuropathy in her hands. Will start with 300mg  daily. The patient will follow up in two weeks to assess medication. If working well then may titrate up.  - VAS [place] (AVF,AVG); Future  2. Hypertension secondary to other renal disorders - Stable Encouraged good control as its slows the progression of atherosclerotic disease  3. Mixed hyperlipidemia - Stable Encouraged good control as its slows the progression of atherosclerotic disease  Current Outpatient Prescriptions on File Prior to Visit  Medication Sig Dispense Refill  . aspirin 81 MG tablet Take 81 mg by mouth daily.    . cinacalcet (SENSIPAR) 30 MG tablet Take 30 mg by mouth daily.    . isosorbide mononitrate (IMDUR) 30 MG 24 hr tablet Take 30 mg by mouth daily.    . metoprolol succinate (TOPROL-XL) 25 MG 24 hr tablet Take 25 mg by mouth daily.    . pravastatin (PRAVACHOL) 20 MG tablet Take 20 mg by mouth daily.    . sevelamer carbonate (RENVELA) 800 MG tablet Take 800 mg by mouth 3 (three)  times daily with meals.     No current facility-administered medications on file prior to visit.     There are no Patient Instructions on file for this visit. No Follow-up on file.   Imanie Darrow A Kayleen Alig, PA-C

## 2016-09-05 DIAGNOSIS — N186 End stage renal disease: Secondary | ICD-10-CM | POA: Diagnosis not present

## 2016-09-05 DIAGNOSIS — D631 Anemia in chronic kidney disease: Secondary | ICD-10-CM | POA: Diagnosis not present

## 2016-09-05 DIAGNOSIS — E119 Type 2 diabetes mellitus without complications: Secondary | ICD-10-CM | POA: Diagnosis not present

## 2016-09-05 DIAGNOSIS — N2581 Secondary hyperparathyroidism of renal origin: Secondary | ICD-10-CM | POA: Diagnosis not present

## 2016-09-06 DIAGNOSIS — H25013 Cortical age-related cataract, bilateral: Secondary | ICD-10-CM | POA: Diagnosis not present

## 2016-09-07 DIAGNOSIS — N186 End stage renal disease: Secondary | ICD-10-CM | POA: Diagnosis not present

## 2016-09-07 DIAGNOSIS — D631 Anemia in chronic kidney disease: Secondary | ICD-10-CM | POA: Diagnosis not present

## 2016-09-07 DIAGNOSIS — N2581 Secondary hyperparathyroidism of renal origin: Secondary | ICD-10-CM | POA: Diagnosis not present

## 2016-09-07 DIAGNOSIS — E119 Type 2 diabetes mellitus without complications: Secondary | ICD-10-CM | POA: Diagnosis not present

## 2016-09-10 DIAGNOSIS — N2581 Secondary hyperparathyroidism of renal origin: Secondary | ICD-10-CM | POA: Diagnosis not present

## 2016-09-10 DIAGNOSIS — E119 Type 2 diabetes mellitus without complications: Secondary | ICD-10-CM | POA: Diagnosis not present

## 2016-09-10 DIAGNOSIS — D631 Anemia in chronic kidney disease: Secondary | ICD-10-CM | POA: Diagnosis not present

## 2016-09-10 DIAGNOSIS — N186 End stage renal disease: Secondary | ICD-10-CM | POA: Diagnosis not present

## 2016-09-12 DIAGNOSIS — D631 Anemia in chronic kidney disease: Secondary | ICD-10-CM | POA: Diagnosis not present

## 2016-09-12 DIAGNOSIS — E119 Type 2 diabetes mellitus without complications: Secondary | ICD-10-CM | POA: Diagnosis not present

## 2016-09-12 DIAGNOSIS — N2581 Secondary hyperparathyroidism of renal origin: Secondary | ICD-10-CM | POA: Diagnosis not present

## 2016-09-12 DIAGNOSIS — N186 End stage renal disease: Secondary | ICD-10-CM | POA: Diagnosis not present

## 2016-09-14 DIAGNOSIS — D631 Anemia in chronic kidney disease: Secondary | ICD-10-CM | POA: Diagnosis not present

## 2016-09-14 DIAGNOSIS — E119 Type 2 diabetes mellitus without complications: Secondary | ICD-10-CM | POA: Diagnosis not present

## 2016-09-14 DIAGNOSIS — N2581 Secondary hyperparathyroidism of renal origin: Secondary | ICD-10-CM | POA: Diagnosis not present

## 2016-09-14 DIAGNOSIS — N186 End stage renal disease: Secondary | ICD-10-CM | POA: Diagnosis not present

## 2016-09-16 ENCOUNTER — Encounter: Payer: Self-pay | Admitting: *Deleted

## 2016-09-17 DIAGNOSIS — N2581 Secondary hyperparathyroidism of renal origin: Secondary | ICD-10-CM | POA: Diagnosis not present

## 2016-09-17 DIAGNOSIS — D631 Anemia in chronic kidney disease: Secondary | ICD-10-CM | POA: Diagnosis not present

## 2016-09-17 DIAGNOSIS — E119 Type 2 diabetes mellitus without complications: Secondary | ICD-10-CM | POA: Diagnosis not present

## 2016-09-17 DIAGNOSIS — N186 End stage renal disease: Secondary | ICD-10-CM | POA: Diagnosis not present

## 2016-09-19 DIAGNOSIS — N2581 Secondary hyperparathyroidism of renal origin: Secondary | ICD-10-CM | POA: Diagnosis not present

## 2016-09-19 DIAGNOSIS — N186 End stage renal disease: Secondary | ICD-10-CM | POA: Diagnosis not present

## 2016-09-19 DIAGNOSIS — E119 Type 2 diabetes mellitus without complications: Secondary | ICD-10-CM | POA: Diagnosis not present

## 2016-09-19 DIAGNOSIS — D631 Anemia in chronic kidney disease: Secondary | ICD-10-CM | POA: Diagnosis not present

## 2016-09-20 NOTE — Discharge Instructions (Signed)
Cataract Surgery, Care After °Refer to this sheet in the next few weeks. These instructions provide you with information about caring for yourself after your procedure. Your health care provider may also give you more specific instructions. Your treatment has been planned according to current medical practices, but problems sometimes occur. Call your health care provider if you have any problems or questions after your procedure. °What can I expect after the procedure? °After the procedure, it is common to have: °· Itching. °· Discomfort. °· Fluid discharge. °· Sensitivity to light and to touch. °· Bruising. °Follow these instructions at home: °Eye Care  °· Check your eye every day for signs of infection. Watch for: °¨ Redness, swelling, or pain. °¨ Fluid, blood, or pus. °¨ Warmth. °¨ Bad smell. °Activity  °· Avoid strenuous activities, such as playing contact sports, for as long as told by your health care provider. °· Do not drive or operate heavy machinery until your health care provider approves. °· Do not bend or lift heavy objects . Bending increases pressure in the eye. You can walk, climb stairs, and do light household chores. °· Ask your health care provider when you can return to work. If you work in a dusty environment, you may be advised to wear protective eyewear for a period of time. °General instructions  °· Take or apply over-the-counter and prescription medicines only as told by your health care provider. This includes eye drops. °· Do not touch or rub your eyes. °· If you were given a protective shield, wear it as told by your health care provider. If you were not given a protective shield, wear sunglasses as told by your health care provider to protect your eyes. °· Keep the area around your eye clean and dry. Avoid swimming or allowing water to hit you directly in the face while showering until told by your health care provider. Keep soap and shampoo out of your eyes. °· Do not put a contact lens  into the affected eye or eyes until your health care provider approves. °· Keep all follow-up visits as told by your health care provider. This is important. °Contact a health care provider if: ° °· You have increased bruising around your eye. °· You have pain that is not helped with medicine. °· You have a fever. °· You have redness, swelling, or pain in your eye. °· You have fluid, blood, or pus coming from your incision. °· Your vision gets worse. °Get help right away if: °· You have sudden vision loss. °This information is not intended to replace advice given to you by your health care provider. Make sure you discuss any questions you have with your health care provider. °Document Released: 11/09/2004 Document Revised: 08/31/2015 Document Reviewed: 03/02/2015 °Elsevier Interactive Patient Education © 2017 [employer] ° ° ° ° °General Anesthesia, Adult, Care After °These instructions provide you with information about caring for yourself after your procedure. Your health care provider may also give you more specific instructions. Your treatment has been planned according to current medical practices, but problems sometimes occur. Call your health care provider if you have any problems or questions after your procedure. °What can I expect after the procedure? °After the procedure, it is common to have: °· Vomiting. °· A sore throat. °· Mental slowness. °It is common to feel: °· Nauseous. °· Cold or shivery. °· Sleepy. °· Tired. °· Sore or achy, even in parts of your body where you did not have surgery. °Follow these instructions at   home: °For at least 24 hours after the procedure:  °· Do not: °¨ Participate in activities where you could fall or become injured. °¨ Drive. °¨ Use heavy machinery. °¨ Drink alcohol. °¨ Take sleeping pills or medicines that cause drowsiness. °¨ Make important decisions or sign legal documents. °¨ Take care of children on your own. °· Rest. °Eating and drinking  °· If you vomit, drink  water, juice, or soup when you can drink without vomiting. °· Drink enough fluid to keep your urine clear or pale yellow. °· Make sure you have little or no nausea before eating solid foods. °· Follow the diet recommended by your health care provider. °General instructions  °· Have a responsible adult stay with you until you are awake and alert. °· Return to your normal activities as told by your health care provider. Ask your health care provider what activities are safe for you. °· Take over-the-counter and prescription medicines only as told by your health care provider. °· If you smoke, do not smoke without supervision. °· Keep all follow-up visits as told by your health care provider. This is important. °Contact a health care provider if: °· You continue to have nausea or vomiting at home, and medicines are not helpful. °· You cannot drink fluids or start eating again. °· You cannot urinate after 8-12 hours. °· You develop a skin rash. °· You have fever. °· You have increasing redness at the site of your procedure. °Get help right away if: °· You have difficulty breathing. °· You have chest pain. °· You have unexpected bleeding. °· You feel that you are having a life-threatening or urgent problem. °This information is not intended to replace advice given to you by your health care provider. Make sure you discuss any questions you have with your health care provider. °Document Released: 07/29/2000 Document Revised: 09/25/2015 Document Reviewed: 04/06/2015 °Elsevier Interactive Patient Education © 2017 Else[employer] Inc[employer] ° °

## 2016-09-21 DIAGNOSIS — N2581 Secondary hyperparathyroidism of renal origin: Secondary | ICD-10-CM | POA: Diagnosis not present

## 2016-09-21 DIAGNOSIS — D631 Anemia in chronic kidney disease: Secondary | ICD-10-CM | POA: Diagnosis not present

## 2016-09-21 DIAGNOSIS — E119 Type 2 diabetes mellitus without complications: Secondary | ICD-10-CM | POA: Diagnosis not present

## 2016-09-21 DIAGNOSIS — N186 End stage renal disease: Secondary | ICD-10-CM | POA: Diagnosis not present

## 2016-09-23 ENCOUNTER — Ambulatory Visit: Payer: Medicare Other | Admitting: Anesthesiology

## 2016-09-23 ENCOUNTER — Ambulatory Visit
Admission: RE | Admit: 2016-09-23 | Discharge: 2016-09-23 | Disposition: A | Discharge status: Home / Self Care | Payer: Medicare Other | Source: Ambulatory Visit | Attending: Ophthalmology | Admitting: Ophthalmology

## 2016-09-23 ENCOUNTER — Encounter
Admission: RE | Disposition: A | Discharge status: Home / Self Care | Payer: Self-pay | Source: Ambulatory Visit | Attending: Ophthalmology

## 2016-09-23 DIAGNOSIS — I120 Hypertensive chronic kidney disease with stage 5 chronic kidney disease or end stage renal disease: Secondary | ICD-10-CM | POA: Insufficient documentation

## 2016-09-23 DIAGNOSIS — Z880 Allergy status to penicillin: Secondary | ICD-10-CM | POA: Diagnosis not present

## 2016-09-23 DIAGNOSIS — E1142 Type 2 diabetes mellitus with diabetic polyneuropathy: Secondary | ICD-10-CM | POA: Insufficient documentation

## 2016-09-23 DIAGNOSIS — Z7982 Long term (current) use of aspirin: Secondary | ICD-10-CM | POA: Diagnosis not present

## 2016-09-23 DIAGNOSIS — Z992 Dependence on renal dialysis: Secondary | ICD-10-CM | POA: Diagnosis not present

## 2016-09-23 DIAGNOSIS — H268 Other specified cataract: Secondary | ICD-10-CM | POA: Insufficient documentation

## 2016-09-23 DIAGNOSIS — E1122 Type 2 diabetes mellitus with diabetic chronic kidney disease: Secondary | ICD-10-CM | POA: Insufficient documentation

## 2016-09-23 DIAGNOSIS — H25043 Posterior subcapsular polar age-related cataract, bilateral: Secondary | ICD-10-CM | POA: Diagnosis not present

## 2016-09-23 DIAGNOSIS — H25041 Posterior subcapsular polar age-related cataract, right eye: Secondary | ICD-10-CM | POA: Diagnosis not present

## 2016-09-23 DIAGNOSIS — Z955 Presence of coronary angioplasty implant and graft: Secondary | ICD-10-CM | POA: Diagnosis not present

## 2016-09-23 DIAGNOSIS — Z79899 Other long term (current) drug therapy: Secondary | ICD-10-CM | POA: Diagnosis not present

## 2016-09-23 DIAGNOSIS — Z91041 Radiographic dye allergy status: Secondary | ICD-10-CM | POA: Insufficient documentation

## 2016-09-23 DIAGNOSIS — N186 End stage renal disease: Secondary | ICD-10-CM | POA: Insufficient documentation

## 2016-09-23 DIAGNOSIS — E7800 Pure hypercholesterolemia, unspecified: Secondary | ICD-10-CM | POA: Diagnosis not present

## 2016-09-23 HISTORY — DX: Polyneuropathy, unspecified: G62.9

## 2016-09-23 HISTORY — DX: Presence of dental prosthetic device (complete) (partial): Z97.2

## 2016-09-23 HISTORY — PX: CATARACT EXTRACTION W/PHACO: SHX586

## 2016-09-23 LAB — POTASSIUM: Potassium: 5.1 mmol/L (ref 3.5–5.1)

## 2016-09-23 SURGERY — PHACOEMULSIFICATION, CATARACT, WITH IOL INSERTION
Anesthesia: Monitor Anesthesia Care | Site: Eye | Laterality: Right | Wound class: Clean

## 2016-09-23 MED ORDER — FENTANYL CITRATE (PF) 100 MCG/2ML IJ SOLN
INTRAMUSCULAR | Status: DC | PRN
  Administered 2016-09-23: 25 ug via INTRAVENOUS
  Administered 2016-09-23: 50 ug via INTRAVENOUS

## 2016-09-23 MED ORDER — EPINEPHRINE PF 1 MG/ML IJ SOLN
INTRAOCULAR | Status: DC | PRN
  Administered 2016-09-23: 171 mL via OPHTHALMIC

## 2016-09-23 MED ORDER — LACTATED RINGERS IV SOLN: 1000.0000 mL | INTRAVENOUS | Status: DC

## 2016-09-23 MED ORDER — BRIMONIDINE TARTRATE-TIMOLOL 0.2-0.5 % OP SOLN
OPHTHALMIC | Status: DC | PRN
  Administered 2016-09-23: 1 [drp] via OPHTHALMIC

## 2016-09-23 MED ORDER — MOXIFLOXACIN HCL 0.5 % OP SOLN
OPHTHALMIC | Status: DC | PRN
  Administered 2016-09-23: 0.5 mL via OPHTHALMIC

## 2016-09-23 MED ORDER — LIDOCAINE HCL (PF) 2 % IJ SOLN
INTRAMUSCULAR | Status: DC | PRN
  Administered 2016-09-23: 1 mL

## 2016-09-23 MED ORDER — NA HYALUR & NA CHOND-NA HYALUR 0.4-0.35 ML IO KIT
PACK | INTRAOCULAR | Status: DC | PRN
  Administered 2016-09-23: 1 mL via INTRAOCULAR

## 2016-09-23 MED ORDER — ARMC OPHTHALMIC DILATING DROPS
1.0000 "application " | OPHTHALMIC | Status: DC | PRN
  Administered 2016-09-23 (×3): 1 via OPHTHALMIC

## 2016-09-23 MED ORDER — MIDAZOLAM HCL 2 MG/2ML IJ SOLN
INTRAMUSCULAR | Status: DC | PRN
  Administered 2016-09-23: 1 mg via INTRAVENOUS

## 2016-09-23 MED ORDER — MOXIFLOXACIN HCL 0.5 % OP SOLN
1.0000 [drp] | OPHTHALMIC | Status: DC | PRN
  Administered 2016-09-23 (×3): 1 [drp] via OPHTHALMIC

## 2016-09-23 MED ORDER — TRYPAN BLUE 0.06 % OP SOLN
OPHTHALMIC | Status: DC | PRN
  Administered 2016-09-23: 0.5 mL via INTRAOCULAR

## 2016-09-23 MED ORDER — BALANCED SALT IO SOLN: INTRAOCULAR | Status: DC | PRN

## 2016-09-23 SURGICAL SUPPLY — 22 items
APPLICATOR COTTON TIP 3IN (MISCELLANEOUS) ×2 IMPLANT
CANNULA ANT/CHMB 27GA (MISCELLANEOUS) ×4 IMPLANT
DISSECTOR HYDRO NUCLEUS 50X22 (MISCELLANEOUS) ×2 IMPLANT
GLOVE BIO SURGEON STRL SZ7 (GLOVE) ×2 IMPLANT
GLOVE SURG LX 6.5 MICRO (GLOVE) ×1
GLOVE SURG LX STRL 6.5 MICRO (GLOVE) ×1 IMPLANT
GOWN STRL REUS W/ TWL LRG LVL3 (GOWN DISPOSABLE) ×2 IMPLANT
GOWN STRL REUS W/TWL LRG LVL3 (GOWN DISPOSABLE) ×2
LENS IOL ACRYSOF IQ 21.0 (Intraocular Lens) ×2 IMPLANT
MARKER SKIN DUAL TIP RULER LAB (MISCELLANEOUS) ×2 IMPLANT
PACK CATARACT BRASINGTON (MISCELLANEOUS) ×2 IMPLANT
PACK EYE AFTER SURG (MISCELLANEOUS) ×2 IMPLANT
PACK OPTHALMIC (MISCELLANEOUS) ×2 IMPLANT
RING MALYGIN 7.0 (MISCELLANEOUS) IMPLANT
SUT ETHILON 10-0 CS-B-6CS-B-6 (SUTURE)
SUT VICRYL  9 0 (SUTURE)
SUT VICRYL 9 0 (SUTURE) IMPLANT
SUTURE EHLN 10-0 CS-B-6CS-B-6 (SUTURE) IMPLANT
SYR TB 1ML LUER SLIP (SYRINGE) ×2 IMPLANT
WATER STERILE IRR 250ML POUR (IV SOLUTION) ×2 IMPLANT
WICK EYE OCUCEL (MISCELLANEOUS) IMPLANT
WIPE NON LINTING 3.25X3.25 (MISCELLANEOUS) ×2 IMPLANT

## 2016-09-23 NOTE — Anesthesia Preprocedure Evaluation (Addendum)
Anesthesia Evaluation  Patient identified by MRN, date of birth, ID band Patient awake    Reviewed: Allergy & Precautions, NPO status , Patient's Chart, lab work & pertinent test results, reviewed documented beta blocker date and time   Airway Mallampati: II  TM Distance: >3 FB Neck ROM: Full    Dental no notable dental hx.    Pulmonary neg pulmonary ROS,    Pulmonary exam normal breath sounds clear to auscultation       Cardiovascular hypertension, + CAD  Normal cardiovascular exam Rhythm:Regular Rate:Normal     Neuro/Psych negative neurological ROS  negative psych ROS   GI/Hepatic GERD  ,  Endo/Other  diabetes  Renal/GU ESRF and DialysisRenal disease  negative genitourinary   Musculoskeletal negative musculoskeletal ROS (+)   Abdominal (+) + obese,   Peds  Hematology  (+) anemia ,   Anesthesia Other Findings   Reproductive/Obstetrics                            Anesthesia Physical Anesthesia Plan  ASA: III  Anesthesia Plan: MAC   Post-op Pain Management:    Induction:   Airway Management Planned: Nasal Cannula  Additional Equipment: None  Intra-op Plan:   Post-operative Plan:   Informed Consent: I have reviewed the patients History and Physical, chart, labs and discussed the procedure including the risks, benefits and alternatives for the proposed anesthesia with the patient or authorized representative who has indicated his/her understanding and acceptance.     Plan Discussed with: CRNA, Anesthesiologist and Surgeon  Anesthesia Plan Comments:         Anesthesia Quick Evaluation

## 2016-09-23 NOTE — Anesthesia Procedure Notes (Signed)
Procedure Name: MAC Performed by: Mayme Genta Pre-anesthesia Checklist: Patient identified, Emergency Drugs available, Suction available, Timeout performed and Patient being monitored Patient Re-evaluated:Patient Re-evaluated prior to inductionOxygen Delivery Method: Nasal cannula Placement Confirmation: positive ETCO2

## 2016-09-23 NOTE — Transfer of Care (Signed)
Immediate Anesthesia Transfer of Care Note  Patient: Kelli Nguyen  Procedure(s) Performed: Procedure(s) with comments: CATARACT EXTRACTION PHACO AND INTRAOCULAR LENS PLACEMENT ([place]) complicated diabetic right (Right) - Diabetic - diet controlled Potassium draw before surgery  Patient Location: PACU  Anesthesia Type: MAC  Level of Consciousness: awake, alert  and patient cooperative  Airway and Oxygen Therapy: Patient Spontanous Breathing and Patient connected to supplemental oxygen  Post-op Assessment: Post-op Vital signs reviewed, Patient's Cardiovascular Status Stable, Respiratory Function Stable, Patent Airway and No signs of Nausea or vomiting  Post-op Vital Signs: Reviewed and stable  Complications: No apparent anesthesia complications

## 2016-09-23 NOTE — Op Note (Signed)
Date of Surgery: 09/23/2016  PREOPERATIVE DIAGNOSES: Visually significant posterior subcapsular cataract, right eye.  POSTOPERATIVE DIAGNOSES: Same  PROCEDURES PERFORMED: Cataract extraction with intraocular lens implant, right eye with the aid of vision blue dye.  SURGEON: Almon Hercules, M.D.  ANESTHESIA: MAC and topical  IMPLANTS: [id number] +21.0 D  Implant Name Type Inv. Item Serial No. Manufacturer Lot No. LRB No. Used  LENS IOL ACRYSOF IQ 21.0 - [id number] Intraocular Lens LENS IOL ACRYSOF IQ 21.0 [id number] [employer]   Right 1     COMPLICATIONS: None.  DESCRIPTION OF PROCEDURE: Therapeutic options were discussed with the patient preoperatively, including a discussion of risks and benefits of surgery. Informed consent was obtained. An IOL-Master and immersion biometry were used to take the lens measurements, and a dilated fundus exam was performed within 6 months of the surgical date.  The patient was premedicated and brought to the operating room and placed on the operating table in the supine position. After adequate anesthesia, the patient was prepped and draped in the usual sterile ophthalmic fashion. A wire lid speculum was inserted and the microscope was positioned. A Superblade was used to create a paracentesis site at the limbus and a small amount of dilute preservative free lidocaine was instilled into the anterior chamber, followed by vision blue, then dispersive viscoelastic. A clear corneal incision was created temporally using a 2.4 mm keratome blade. Capsulorrhexis was then performed. In situ phacoemulsification was performed.  Cortical material was removed with the irrigation-aspiration unit. Dispersive viscoelastic was instilled to open the capsular bag. A posterior chamber intraocular lens with the specifications above was inserted and positioned. Irrigation-aspiration was used to remove all viscoelastic. Cefuroxime 1cc was instilled into the anterior chamber, and the  corneal incision was checked and found to be water tight. The eyelid speculum was removed.  The operative eye was covered with protective goggles after instilling 1 drop of timolol and brimonidine. The patient tolerated the procedure well. There were no complications.

## 2016-09-23 NOTE — Anesthesia Postprocedure Evaluation (Signed)
Anesthesia Post Note  Patient: Kelli Nguyen  Procedure(s) Performed: Procedure(s) (LRB): CATARACT EXTRACTION PHACO AND INTRAOCULAR LENS PLACEMENT ([place]) complicated diabetic right (Right)  Patient location during evaluation: PACU Anesthesia Type: MAC Level of consciousness: awake Pain management: pain level controlled Vital Signs Assessment: post-procedure vital signs reviewed and stable Respiratory status: spontaneous breathing Cardiovascular status: blood pressure returned to baseline Postop Assessment: no headache Anesthetic complications: no    Lavonna Monarch

## 2016-09-23 NOTE — H&P (Signed)
H+P reviewed and is up to date, please see paper chart.  

## 2016-09-24 ENCOUNTER — Encounter: Payer: Self-pay | Admitting: Ophthalmology

## 2016-09-24 DIAGNOSIS — N2581 Secondary hyperparathyroidism of renal origin: Secondary | ICD-10-CM | POA: Diagnosis not present

## 2016-09-24 DIAGNOSIS — N186 End stage renal disease: Secondary | ICD-10-CM | POA: Diagnosis not present

## 2016-09-24 DIAGNOSIS — E119 Type 2 diabetes mellitus without complications: Secondary | ICD-10-CM | POA: Diagnosis not present

## 2016-09-24 DIAGNOSIS — D631 Anemia in chronic kidney disease: Secondary | ICD-10-CM | POA: Diagnosis not present

## 2016-09-26 DIAGNOSIS — E119 Type 2 diabetes mellitus without complications: Secondary | ICD-10-CM | POA: Diagnosis not present

## 2016-09-26 DIAGNOSIS — N186 End stage renal disease: Secondary | ICD-10-CM | POA: Diagnosis not present

## 2016-09-26 DIAGNOSIS — D631 Anemia in chronic kidney disease: Secondary | ICD-10-CM | POA: Diagnosis not present

## 2016-09-26 DIAGNOSIS — N2581 Secondary hyperparathyroidism of renal origin: Secondary | ICD-10-CM | POA: Diagnosis not present

## 2016-09-28 DIAGNOSIS — E119 Type 2 diabetes mellitus without complications: Secondary | ICD-10-CM | POA: Diagnosis not present

## 2016-09-28 DIAGNOSIS — N186 End stage renal disease: Secondary | ICD-10-CM | POA: Diagnosis not present

## 2016-09-28 DIAGNOSIS — D631 Anemia in chronic kidney disease: Secondary | ICD-10-CM | POA: Diagnosis not present

## 2016-09-28 DIAGNOSIS — N2581 Secondary hyperparathyroidism of renal origin: Secondary | ICD-10-CM | POA: Diagnosis not present

## 2016-10-01 DIAGNOSIS — E119 Type 2 diabetes mellitus without complications: Secondary | ICD-10-CM | POA: Diagnosis not present

## 2016-10-01 DIAGNOSIS — N186 End stage renal disease: Secondary | ICD-10-CM | POA: Diagnosis not present

## 2016-10-01 DIAGNOSIS — N2581 Secondary hyperparathyroidism of renal origin: Secondary | ICD-10-CM | POA: Diagnosis not present

## 2016-10-01 DIAGNOSIS — D631 Anemia in chronic kidney disease: Secondary | ICD-10-CM | POA: Diagnosis not present

## 2016-10-03 DIAGNOSIS — N186 End stage renal disease: Secondary | ICD-10-CM | POA: Diagnosis not present

## 2016-10-03 DIAGNOSIS — E1122 Type 2 diabetes mellitus with diabetic chronic kidney disease: Secondary | ICD-10-CM | POA: Diagnosis not present

## 2016-10-03 DIAGNOSIS — Z992 Dependence on renal dialysis: Secondary | ICD-10-CM | POA: Diagnosis not present

## 2016-10-03 DIAGNOSIS — N2581 Secondary hyperparathyroidism of renal origin: Secondary | ICD-10-CM | POA: Diagnosis not present

## 2016-10-03 DIAGNOSIS — D631 Anemia in chronic kidney disease: Secondary | ICD-10-CM | POA: Diagnosis not present

## 2016-10-03 DIAGNOSIS — E119 Type 2 diabetes mellitus without complications: Secondary | ICD-10-CM | POA: Diagnosis not present

## 2016-10-05 DIAGNOSIS — N186 End stage renal disease: Secondary | ICD-10-CM | POA: Diagnosis not present

## 2016-10-05 DIAGNOSIS — N2581 Secondary hyperparathyroidism of renal origin: Secondary | ICD-10-CM | POA: Diagnosis not present

## 2016-10-05 DIAGNOSIS — E119 Type 2 diabetes mellitus without complications: Secondary | ICD-10-CM | POA: Diagnosis not present

## 2016-10-08 DIAGNOSIS — N186 End stage renal disease: Secondary | ICD-10-CM | POA: Diagnosis not present

## 2016-10-08 DIAGNOSIS — N2581 Secondary hyperparathyroidism of renal origin: Secondary | ICD-10-CM | POA: Diagnosis not present

## 2016-10-08 DIAGNOSIS — E119 Type 2 diabetes mellitus without complications: Secondary | ICD-10-CM | POA: Diagnosis not present

## 2016-10-09 ENCOUNTER — Other Ambulatory Visit (INDEPENDENT_AMBULATORY_CARE_PROVIDER_SITE_OTHER): Payer: Self-pay | Admitting: Vascular Surgery

## 2016-10-09 ENCOUNTER — Encounter (INDEPENDENT_AMBULATORY_CARE_PROVIDER_SITE_OTHER): Payer: Self-pay

## 2016-10-10 DIAGNOSIS — N2581 Secondary hyperparathyroidism of renal origin: Secondary | ICD-10-CM | POA: Diagnosis not present

## 2016-10-10 DIAGNOSIS — E119 Type 2 diabetes mellitus without complications: Secondary | ICD-10-CM | POA: Diagnosis not present

## 2016-10-10 DIAGNOSIS — N186 End stage renal disease: Secondary | ICD-10-CM | POA: Diagnosis not present

## 2016-10-12 DIAGNOSIS — E119 Type 2 diabetes mellitus without complications: Secondary | ICD-10-CM | POA: Diagnosis not present

## 2016-10-12 DIAGNOSIS — N186 End stage renal disease: Secondary | ICD-10-CM | POA: Diagnosis not present

## 2016-10-12 DIAGNOSIS — N2581 Secondary hyperparathyroidism of renal origin: Secondary | ICD-10-CM | POA: Diagnosis not present

## 2016-10-14 ENCOUNTER — Other Ambulatory Visit (INDEPENDENT_AMBULATORY_CARE_PROVIDER_SITE_OTHER): Payer: Self-pay | Admitting: Vascular Surgery

## 2016-10-15 DIAGNOSIS — E119 Type 2 diabetes mellitus without complications: Secondary | ICD-10-CM | POA: Diagnosis not present

## 2016-10-15 DIAGNOSIS — N2581 Secondary hyperparathyroidism of renal origin: Secondary | ICD-10-CM | POA: Diagnosis not present

## 2016-10-15 DIAGNOSIS — N186 End stage renal disease: Secondary | ICD-10-CM | POA: Diagnosis not present

## 2016-10-15 MED ORDER — CLINDAMYCIN PHOSPHATE 300 MG/50ML IV SOLN
300.0000 mg | Freq: Once | INTRAVENOUS | Status: AC
  Administered 2016-10-16: 09:00:00 via INTRAVENOUS

## 2016-10-16 ENCOUNTER — Encounter
Admission: RE | Disposition: A | Discharge status: Home / Self Care | Payer: Self-pay | Source: Ambulatory Visit | Attending: Vascular Surgery

## 2016-10-16 ENCOUNTER — Ambulatory Visit
Admission: RE | Admit: 2016-10-16 | Discharge: 2016-10-16 | Disposition: A | Discharge status: Home / Self Care | Payer: Medicare Other | Source: Ambulatory Visit | Attending: Vascular Surgery | Admitting: Vascular Surgery

## 2016-10-16 DIAGNOSIS — Z9841 Cataract extraction status, right eye: Secondary | ICD-10-CM | POA: Insufficient documentation

## 2016-10-16 DIAGNOSIS — K219 Gastro-esophageal reflux disease without esophagitis: Secondary | ICD-10-CM | POA: Insufficient documentation

## 2016-10-16 DIAGNOSIS — Z9889 Other specified postprocedural states: Secondary | ICD-10-CM | POA: Diagnosis not present

## 2016-10-16 DIAGNOSIS — T82858A Stenosis of vascular prosthetic devices, implants and grafts, initial encounter: Secondary | ICD-10-CM | POA: Diagnosis not present

## 2016-10-16 DIAGNOSIS — I2510 Atherosclerotic heart disease of native coronary artery without angina pectoris: Secondary | ICD-10-CM | POA: Diagnosis not present

## 2016-10-16 DIAGNOSIS — I953 Hypotension of hemodialysis: Secondary | ICD-10-CM | POA: Insufficient documentation

## 2016-10-16 DIAGNOSIS — Z91041 Radiographic dye allergy status: Secondary | ICD-10-CM | POA: Diagnosis not present

## 2016-10-16 DIAGNOSIS — E119 Type 2 diabetes mellitus without complications: Secondary | ICD-10-CM | POA: Diagnosis not present

## 2016-10-16 DIAGNOSIS — E1140 Type 2 diabetes mellitus with diabetic neuropathy, unspecified: Secondary | ICD-10-CM | POA: Diagnosis not present

## 2016-10-16 DIAGNOSIS — I120 Hypertensive chronic kidney disease with stage 5 chronic kidney disease or end stage renal disease: Secondary | ICD-10-CM | POA: Diagnosis not present

## 2016-10-16 DIAGNOSIS — E1122 Type 2 diabetes mellitus with diabetic chronic kidney disease: Secondary | ICD-10-CM | POA: Diagnosis not present

## 2016-10-16 DIAGNOSIS — Z885 Allergy status to narcotic agent status: Secondary | ICD-10-CM | POA: Insufficient documentation

## 2016-10-16 DIAGNOSIS — Z880 Allergy status to penicillin: Secondary | ICD-10-CM | POA: Insufficient documentation

## 2016-10-16 DIAGNOSIS — Z951 Presence of aortocoronary bypass graft: Secondary | ICD-10-CM | POA: Insufficient documentation

## 2016-10-16 DIAGNOSIS — Z888 Allergy status to other drugs, medicaments and biological substances status: Secondary | ICD-10-CM | POA: Insufficient documentation

## 2016-10-16 DIAGNOSIS — I10 Essential (primary) hypertension: Secondary | ICD-10-CM | POA: Diagnosis not present

## 2016-10-16 DIAGNOSIS — N186 End stage renal disease: Secondary | ICD-10-CM | POA: Diagnosis not present

## 2016-10-16 DIAGNOSIS — Z992 Dependence on renal dialysis: Secondary | ICD-10-CM | POA: Insufficient documentation

## 2016-10-16 DIAGNOSIS — Y832 Surgical operation with anastomosis, bypass or graft as the cause of abnormal reaction of the patient, or of later complication, without mention of misadventure at the time of the procedure: Secondary | ICD-10-CM | POA: Diagnosis not present

## 2016-10-16 DIAGNOSIS — T82868A Thrombosis of vascular prosthetic devices, implants and grafts, initial encounter: Secondary | ICD-10-CM | POA: Diagnosis not present

## 2016-10-16 DIAGNOSIS — D631 Anemia in chronic kidney disease: Secondary | ICD-10-CM | POA: Insufficient documentation

## 2016-10-16 DIAGNOSIS — Z90710 Acquired absence of both cervix and uterus: Secondary | ICD-10-CM | POA: Diagnosis not present

## 2016-10-16 HISTORY — PX: A/V SHUNT INTERVENTION: CATH118220

## 2016-10-16 HISTORY — PX: A/V SHUNTOGRAM: CATH118297

## 2016-10-16 LAB — GLUCOSE, CAPILLARY: Glucose-Capillary: 143 mg/dL — ABNORMAL HIGH (ref 65–99)

## 2016-10-16 LAB — POTASSIUM (ARMC VASCULAR LAB ONLY): POTASSIUM (ARMC VASCULAR LAB): 4.2 (ref 3.5–5.1)

## 2016-10-16 SURGERY — A/V SHUNTOGRAM
Anesthesia: Moderate Sedation

## 2016-10-16 MED ORDER — IOPAMIDOL (ISOVUE-300) INJECTION 61%
INTRAVENOUS | Status: DC | PRN
  Administered 2016-10-16: 25 mL via INTRAVENOUS

## 2016-10-16 MED ORDER — LIDOCAINE HCL (PF) 1 % IJ SOLN
INTRAMUSCULAR | Status: AC
  Filled 2016-10-16: qty 30

## 2016-10-16 MED ORDER — SODIUM CHLORIDE 0.9 % IV SOLN
INTRAVENOUS | Status: DC
  Administered 2016-10-16: 09:00:00 via INTRAVENOUS

## 2016-10-16 MED ORDER — HEPARIN SODIUM (PORCINE) 1000 UNIT/ML IJ SOLN
INTRAMUSCULAR | Status: DC | PRN
  Administered 2016-10-16: 3000 [IU] via INTRAVENOUS

## 2016-10-16 MED ORDER — HEPARIN SODIUM (PORCINE) 1000 UNIT/ML IJ SOLN
INTRAMUSCULAR | Status: AC
  Filled 2016-10-16: qty 1

## 2016-10-16 MED ORDER — ONDANSETRON HCL 4 MG/2ML IJ SOLN: 4.0000 mg | Freq: Four times a day (QID) | INTRAMUSCULAR | Status: DC | PRN

## 2016-10-16 MED ORDER — METHYLPREDNISOLONE SODIUM SUCC 125 MG IJ SOLR: 125.0000 mg | INTRAMUSCULAR | Status: DC | PRN

## 2016-10-16 MED ORDER — MIDAZOLAM HCL 5 MG/5ML IJ SOLN
INTRAMUSCULAR | Status: AC
  Filled 2016-10-16: qty 5

## 2016-10-16 MED ORDER — HYDROMORPHONE HCL 1 MG/ML IJ SOLN: 1.0000 mg | Freq: Once | INTRAMUSCULAR | Status: DC | PRN

## 2016-10-16 MED ORDER — FENTANYL CITRATE (PF) 100 MCG/2ML IJ SOLN
INTRAMUSCULAR | Status: DC | PRN
  Administered 2016-10-16: 25 ug via INTRAVENOUS
  Administered 2016-10-16: 50 ug via INTRAVENOUS

## 2016-10-16 MED ORDER — MIDAZOLAM HCL 2 MG/2ML IJ SOLN
INTRAMUSCULAR | Status: DC | PRN
  Administered 2016-10-16: 2 mg via INTRAVENOUS
  Administered 2016-10-16: 1 mg via INTRAVENOUS

## 2016-10-16 MED ORDER — FENTANYL CITRATE (PF) 100 MCG/2ML IJ SOLN
INTRAMUSCULAR | Status: AC
  Filled 2016-10-16: qty 2

## 2016-10-16 MED ORDER — FAMOTIDINE 20 MG PO TABS: 40.0000 mg | ORAL_TABLET | ORAL | Status: DC | PRN

## 2016-10-16 MED ORDER — CLINDAMYCIN PHOSPHATE 300 MG/50ML IV SOLN
INTRAVENOUS | Status: AC
  Filled 2016-10-16: qty 50

## 2016-10-16 SURGICAL SUPPLY — 11 items
BALLN ULTRVRSE 8X40X75C (BALLOONS) ×4
BALLOON ULTRVRSE 8X40X75C (BALLOONS) ×2 IMPLANT
DEVICE PRESTO INFLATION (MISCELLANEOUS) ×4 IMPLANT
DRAPE BRACHIAL (DRAPES) ×4 IMPLANT
NEEDLE ENTRY 21GA 7CM ECHOTIP (NEEDLE) ×4 IMPLANT
PACK ANGIOGRAPHY (CUSTOM PROCEDURE TRAY) ×4 IMPLANT
SET INTRO CAPELLA COAXIAL (SET/KITS/TRAYS/PACK) ×4 IMPLANT
SHEATH BRITE TIP 6FRX5.5 (SHEATH) ×4 IMPLANT
SHIELD X-DRAPE GOLD 12X17 (MISCELLANEOUS) ×4 IMPLANT
SUT MNCRL AB 4-0 PS2 18 (SUTURE) ×4 IMPLANT
WIRE J 3MM .035X145CM (WIRE) ×8 IMPLANT

## 2016-10-16 NOTE — H&P (Signed)
[facility] Admission History & Physical  MRN : [id number]  Kelli Nguyen is a 71 y.o. (June 27, 1945) female who presents with chief complaint of No chief complaint on file. [name]  History of Present Illness: I am asked to evaluate the patient by the dialysis center. The patient was sent here because they were unable to achieve adequate dialysis this morning. Furthermore the Center states there is very poor thrill and bruit. The patient states there there have been increasing problems with the access, "slow flow" during dialysis and prolonged bleeding after decannulation. The patient estimates these problems have been going on for several weeks. The patient is unaware of any other change.  Patient denies pain or tenderness overlying the access.  There is no pain with dialysis.  The patient denies hand pain or finger pain consistent with steal syndrome.   There have been a few past interventions and declots of this access.  The patient is chronically hypotensive on dialysis.  Current Facility-Administered Medications  Medication Dose Route Frequency Provider Last Rate Last Dose  . clindamycin (CLEOCIN) 300 MG/50ML IVPB           . 0.9 %  sodium chloride infusion   Intravenous Continuous Stegmayer, Kimberly A, PA-C 10 mL/hr at 10/16/16 0843    . clindamycin (CLEOCIN) IVPB 300 mg  300 mg Intravenous Once Stegmayer, Kimberly A, PA-C      . famotidine (PEPCID) tablet 40 mg  40 mg Oral PRN Stegmayer, Janalyn Harder, PA-C      . HYDROmorphone (DILAUDID) injection 1 mg  1 mg Intravenous Once PRN Stegmayer, Kimberly A, PA-C      . methylPREDNISolone sodium succinate (SOLU-MEDROL) 125 mg/2 mL injection 125 mg  125 mg Intravenous PRN Stegmayer, Kimberly A, PA-C      . ondansetron (ZOFRAN) injection 4 mg  4 mg Intravenous Q6H PRN Stegmayer, Janalyn Harder, PA-C        Past Medical History:  Diagnosis Date  . Anemia   . Chronic cough   . Chronic kidney disease   . Coronary artery  disease   . Diabetes mellitus without complication ([place])   . Dialysis patient ([place])    T-TH-SAT  . GERD (gastroesophageal reflux disease)   . Hypertension   . Neuropathy    hands  . Wears dentures    partial upper and lower    Past Surgical History:  Procedure Laterality Date  . ABDOMINAL HYSTERECTOMY    . CARPAL TUNNEL RELEASE Left 09/27/2015   Procedure: CARPAL TUNNEL RELEASE;  Surgeon: Leanor Kail, MD;  Location: [facility];  Service: Orthopedics;  Laterality: Left;  . CATARACT EXTRACTION W/PHACO Right 09/23/2016   Procedure: CATARACT EXTRACTION PHACO AND INTRAOCULAR LENS PLACEMENT (IOC) complicated diabetic right;  Surgeon: Ronnell Freshwater, MD;  Location: [place];  Service: Ophthalmology;  Laterality: Right;  Diabetic - diet controlled Potassium draw before surgery  . COLONOSCOPY    . COLONOSCOPY WITH PROPOFOL N/A 11/28/2014   Procedure: COLONOSCOPY WITH PROPOFOL;  Surgeon: Manya Silvas, MD;  Location: [facility];  Service: Endoscopy;  Laterality: N/A;  . CORONARY ARTERY BYPASS GRAFT    . PERIPHERAL VASCULAR CATHETERIZATION Left 01/18/2015   Procedure: A/V Shuntogram/Fistulagram;  Surgeon: Katha Cabal, MD;  Location: [place] [facility];  Service: Cardiovascular;  Laterality: Left;  . PERIPHERAL VASCULAR CATHETERIZATION Left 01/18/2015   Procedure: A/V Shunt Intervention;  Surgeon: Katha Cabal, MD;  Location: [place] [facility];  Service: Cardiovascular;  Laterality: Left;  .  PERIPHERAL VASCULAR CATHETERIZATION N/A 08/10/2015   Procedure: Thrombectomy;  Surgeon: Algernon Huxley, MD;  Location: [place] [facility];  Service: Cardiovascular;  Laterality: N/A;  . PERIPHERAL VASCULAR CATHETERIZATION N/A 08/10/2015   Procedure: A/V Shuntogram/Fistulagram;  Surgeon: Algernon Huxley, MD;  Location: [place] [facility];  Service: Cardiovascular;  Laterality: N/A;  . PERIPHERAL VASCULAR CATHETERIZATION N/A 08/10/2015   Procedure: A/V Shunt Intervention;   Surgeon: Algernon Huxley, MD;  Location: [place] [facility];  Service: Cardiovascular;  Laterality: N/A;  . RENAL SHUNTS      Social History Social History  Substance Use Topics  . Smoking status: Never Smoker  . Smokeless tobacco: Never Used  . Alcohol use No    Family History No family history on file.  No family history of bleeding or clotting disorders, autoimmune disease or porphyria  Allergies  Allergen Reactions  . Codeine Sulfate Nausea Only  . Contrast Media [Iodinated Diagnostic Agents] Hives  . Gadobutrol Hives  . Gadolinium Derivatives Hives  . Penicillins Hives and Itching    Has patient had a PCN reaction causing immediate rash, facial/tongue/throat swelling, SOB or lightheadedness with hypotension: No Has patient had a PCN reaction causing severe rash involving mucus membranes or skin necrosis: No Has patient had a PCN reaction that required hospitalization: No Has patient had a PCN reaction occurring within the last 10 years: No If all of the above answers are "NO", then may proceed with Cephalosporin use.   . Statins Other (See Comments)    muscle pain     REVIEW OF SYSTEMS (Negative unless checked)  Constitutional: [] Weight loss  [] Fever  [] Chills Cardiac: [] Chest pain   [] Chest pressure   [] Palpitations   [] Shortness of breath when laying flat   [] Shortness of breath at rest   [x] Shortness of breath with exertion. Vascular:  [] Pain in legs with walking   [] Pain in legs at rest   [] Pain in legs when laying flat   [] Claudication   [] Pain in feet when walking  [] Pain in feet at rest  [] Pain in feet when laying flat   [] History of DVT   [] Phlebitis   [] Swelling in legs   [] Varicose veins   [] Non-healing ulcers Pulmonary:   [] Uses home oxygen   [] Productive cough   [] Hemoptysis   [] Wheeze  [] COPD   [] Asthma Neurologic:  [] Dizziness  [] Blackouts   [] Seizures   [] History of stroke   [] History of TIA  [] Aphasia   [] Temporary blindness   [] Dysphagia   [] Weakness or  numbness in arms   [] Weakness or numbness in legs Musculoskeletal:  [] Arthritis   [] Joint swelling   [] Joint pain   [] Low back pain Hematologic:  [] Easy bruising  [] Easy bleeding   [] Hypercoagulable state   [] Anemic  [] Hepatitis Gastrointestinal:  [] Blood in stool   [] Vomiting blood  [] Gastroesophageal reflux/heartburn   [] Difficulty swallowing. Genitourinary:  [x] Chronic kidney disease   [] Difficult urination  [] Frequent urination  [] Burning with urination   [] Blood in urine Skin:  [] Rashes   [] Ulcers   [] Wounds Psychological:  [] History of anxiety   []  History of major depression.  Physical Examination  Vitals:   10/16/16 0819  BP: 134/76  Pulse: 80  Resp: 19  Temp: 98 F (36.7 C)  TempSrc: Oral  SpO2: 95%  Weight: 76.7 kg (169 lb)  Height: 4\' 11"  (1.499 m)   Body mass index is 34.13 kg/m. Gen: WD/WN, NAD Head: [place]/AT, No temporalis wasting. Prominent temp pulse not noted. Ear/Nose/Throat: Hearing grossly  intact, nares w/o erythema or drainage, oropharynx w/o Erythema/Exudate,  Eyes: Conjunctiva clear, sclera non-icteric Neck: Trachea midline.  No JVD.  Pulmonary:  Good air movement, respirations not labored, no use of accessory muscles.  Cardiac: RRR, normal S1, S2. Vascular:  Left arm AV HERO graft weak thrill and weak bruit Vessel Right Left  Radial Palpable Palpable  Ulnar Not Palpable Not Palpable  Brachial Palpable Palpable  Carotid Palpable, without bruit Palpable, without bruit  Gastrointestinal: soft, non-tender/non-distended. No guarding/reflex.  Musculoskeletal: M/S 5/5 throughout.  Extremities without ischemic changes.  No deformity or atrophy.  Neurologic: Sensation grossly intact in extremities.  Symmetrical.  Speech is fluent. Motor exam as listed above. Psychiatric: Judgment intact, Mood & affect appropriate for pt's clinical situation. Dermatologic: No rashes or ulcers noted.  No cellulitis or open wounds. Lymph : No Cervical, Axillary, or Inguinal  lymphadenopathy.   CBC Lab Results  Component Value Date   WBC 8.3 09/26/2015   HGB 13.3 09/27/2015   HCT 39.0 09/27/2015   MCV 81.1 09/26/2015   PLT 135 (L) 09/26/2015    BMET    Component Value Date/Time   NA 137 09/27/2015 0900   NA 135 (L) 03/29/2013 1409   K 5.1 09/23/2016 0809   K 4.5 03/29/2013 1409   CL 99 (L) 09/26/2015 1517   CL 97 (L) 03/29/2013 1409   CO2 32 09/26/2015 1517   CO2 27 03/29/2013 1409   GLUCOSE 102 (H) 09/27/2015 0900   GLUCOSE 97 03/29/2013 1409   BUN 28 (H) 09/26/2015 1517   BUN 48 (H) 03/29/2013 1409   CREATININE 7.13 (H) 09/26/2015 1517   CREATININE 12.06 (H) 03/29/2013 1409   CALCIUM 8.5 (L) 09/26/2015 1517   CALCIUM 8.8 03/29/2013 1409   GFRNONAA 5 (L) 09/26/2015 1517   GFRNONAA 3 (L) 03/29/2013 1409   GFRAA 6 (L) 09/26/2015 1517   GFRAA 3 (L) 03/29/2013 1409   CrCl cannot be calculated (Patient's most recent lab result is older than the maximum 21 days allowed.).  COAG No results found for: INR, PROTIME  Radiology No results found.  Assessment/Plan 1.  Complication dialysis device with thrombosis AV access:  Patient's left arm HERO dialysis access is malfunctioning. The patient will undergo angiography and correction of any problems using interventional techniques with the hope of restoring function to the access.  The risks and benefits were described to the patient.  All questions were answered.  The patient agrees to proceed with angiography and intervention. Potassium will be drawn to ensure that it is an appropriate level prior to performing intervention. 2.  End-stage renal disease requiring hemodialysis:  Patient will continue dialysis therapy without further interruption if a successful intervention is not achieved then a tunneled catheter will be placed. Dialysis has already been arranged. 3.  Hypertension:  Patient will continue medical management; nephrology is following no changes in oral medications. 4. Diabetes mellitus:   Glucose will be monitored and oral medications been held this morning once the patient has undergone the patient's procedure po intake will be reinitiated and again Accu-Cheks will be used to assess the blood glucose level and treat as needed. The patient will be restarted on the patient's usual hypoglycemic regime 5.  Coronary artery disease:  EKG will be monitored. Nitrates will be used if needed. The patient's oral cardiac medications will be continued.    Hortencia Pilar, MD  10/16/2016 9:24 AM

## 2016-10-16 NOTE — Op Note (Signed)
OPERATIVE NOTE   PROCEDURE: 1. Contrast injection left arm hero  AV access 2. Percutaneous transluminal angioplasty to 8 mm venous portion left arm hero graft  PRE-OPERATIVE DIAGNOSIS: Complication of dialysis access                                                       End Stage Renal Disease  POST-OPERATIVE DIAGNOSIS: same as above   SURGEON: Katha Cabal, M.D.  ANESTHESIA: Conscious sedation was administered under my direct supervision by the interventional radiology RN. IV Versed plus fentanyl were utilized. Continuous ECG, pulse oximetry and blood pressure was monitored throughout the entire procedure.  Conscious sedation was for a total of 36 minutes.  ESTIMATED BLOOD LOSS: minimal  FINDING(S): Stricture of the AV graft  SPECIMEN(S):  None  CONTRAST: 25 cc  FLUOROSCOPY TIME: 1.1 minutes  INDICATIONS: Kelli Nguyen is a 71 y.o. female who  presents with malfunctioning left arm hero AV access.  The patient is scheduled for angiography with possible intervention of the AV access.  The patient is aware the risks include but are not limited to: bleeding, infection, thrombosis of the cannulated access, and possible anaphylactic reaction to the contrast.  The patient acknowledges if the access can not be salvaged a tunneled catheter will be needed and will be placed during this procedure.  The patient is aware of the risks of the procedure and elects to proceed with the angiogram and intervention.  DESCRIPTION: After full informed written consent was obtained, the patient was brought back to the Special Procedure suite and placed supine position.  Appropriate cardiopulmonary monitors were placed.  The left arm was prepped and draped in the standard fashion.  Appropriate timeout is called. The hero graft  was cannulated with a micropuncture needle.  Cannulation was performed with ultrasound guidance. Ultrasound was placed in a sterile sleeve, the AV access was interrogated and  noted to be echolucent and compressible indicating patency. Image was recorded for the permanent record. The puncture is performed under continuous ultrasound visualization.   The microwire was advanced and the needle was exchanged for  a microsheath.  The J-wire was then advanced and a 6 Fr sheath inserted.  Hand injections were completed to image the access from the arterial anastomosis through the entire access.  The central venous structures were also imaged by hand injections.  Based on the images,  3000 units of heparin was given and a wire was negotiated through the strictures within the venous portion of the graft.  An 8 mm x 4 cm Ultraverse balloon was used.  Inflation was to 14 atm for 1 minute.  Follow-up imaging demonstrates complete resolution of the stricture with rapid flow of contrast through the graft, the central venous anatomy is preserved.  A 4-0 Monocryl purse-string suture was sewn around the sheath.  The sheath was removed and light pressure was applied.  A sterile bandage was applied to the puncture site.    COMPLICATIONS: None  CONDITION: Kelli Nguyen, M.D [facility] Office: [phone]  10/16/2016 4:26 PM

## 2016-10-17 ENCOUNTER — Encounter: Payer: Self-pay | Admitting: Vascular Surgery

## 2016-10-17 DIAGNOSIS — N2581 Secondary hyperparathyroidism of renal origin: Secondary | ICD-10-CM | POA: Diagnosis not present

## 2016-10-17 DIAGNOSIS — N186 End stage renal disease: Secondary | ICD-10-CM | POA: Diagnosis not present

## 2016-10-17 DIAGNOSIS — E119 Type 2 diabetes mellitus without complications: Secondary | ICD-10-CM | POA: Diagnosis not present

## 2016-10-19 DIAGNOSIS — N186 End stage renal disease: Secondary | ICD-10-CM | POA: Diagnosis not present

## 2016-10-19 DIAGNOSIS — N2581 Secondary hyperparathyroidism of renal origin: Secondary | ICD-10-CM | POA: Diagnosis not present

## 2016-10-19 DIAGNOSIS — E119 Type 2 diabetes mellitus without complications: Secondary | ICD-10-CM | POA: Diagnosis not present

## 2016-10-22 DIAGNOSIS — N2581 Secondary hyperparathyroidism of renal origin: Secondary | ICD-10-CM | POA: Diagnosis not present

## 2016-10-22 DIAGNOSIS — N186 End stage renal disease: Secondary | ICD-10-CM | POA: Diagnosis not present

## 2016-10-22 DIAGNOSIS — E119 Type 2 diabetes mellitus without complications: Secondary | ICD-10-CM | POA: Diagnosis not present

## 2016-10-24 DIAGNOSIS — E119 Type 2 diabetes mellitus without complications: Secondary | ICD-10-CM | POA: Diagnosis not present

## 2016-10-24 DIAGNOSIS — N2581 Secondary hyperparathyroidism of renal origin: Secondary | ICD-10-CM | POA: Diagnosis not present

## 2016-10-24 DIAGNOSIS — N186 End stage renal disease: Secondary | ICD-10-CM | POA: Diagnosis not present

## 2016-10-26 DIAGNOSIS — E119 Type 2 diabetes mellitus without complications: Secondary | ICD-10-CM | POA: Diagnosis not present

## 2016-10-26 DIAGNOSIS — N186 End stage renal disease: Secondary | ICD-10-CM | POA: Diagnosis not present

## 2016-10-26 DIAGNOSIS — N2581 Secondary hyperparathyroidism of renal origin: Secondary | ICD-10-CM | POA: Diagnosis not present

## 2016-10-29 DIAGNOSIS — E119 Type 2 diabetes mellitus without complications: Secondary | ICD-10-CM | POA: Diagnosis not present

## 2016-10-29 DIAGNOSIS — N2581 Secondary hyperparathyroidism of renal origin: Secondary | ICD-10-CM | POA: Diagnosis not present

## 2016-10-29 DIAGNOSIS — N186 End stage renal disease: Secondary | ICD-10-CM | POA: Diagnosis not present

## 2016-10-31 DIAGNOSIS — E119 Type 2 diabetes mellitus without complications: Secondary | ICD-10-CM | POA: Diagnosis not present

## 2016-10-31 DIAGNOSIS — N2581 Secondary hyperparathyroidism of renal origin: Secondary | ICD-10-CM | POA: Diagnosis not present

## 2016-10-31 DIAGNOSIS — N186 End stage renal disease: Secondary | ICD-10-CM | POA: Diagnosis not present

## 2016-11-02 DIAGNOSIS — N2581 Secondary hyperparathyroidism of renal origin: Secondary | ICD-10-CM | POA: Diagnosis not present

## 2016-11-02 DIAGNOSIS — E119 Type 2 diabetes mellitus without complications: Secondary | ICD-10-CM | POA: Diagnosis not present

## 2016-11-02 DIAGNOSIS — Z992 Dependence on renal dialysis: Secondary | ICD-10-CM | POA: Diagnosis not present

## 2016-11-02 DIAGNOSIS — E1122 Type 2 diabetes mellitus with diabetic chronic kidney disease: Secondary | ICD-10-CM | POA: Diagnosis not present

## 2016-11-02 DIAGNOSIS — N186 End stage renal disease: Secondary | ICD-10-CM | POA: Diagnosis not present

## 2016-11-05 DIAGNOSIS — E119 Type 2 diabetes mellitus without complications: Secondary | ICD-10-CM | POA: Diagnosis not present

## 2016-11-05 DIAGNOSIS — D631 Anemia in chronic kidney disease: Secondary | ICD-10-CM | POA: Diagnosis not present

## 2016-11-05 DIAGNOSIS — N2581 Secondary hyperparathyroidism of renal origin: Secondary | ICD-10-CM | POA: Diagnosis not present

## 2016-11-05 DIAGNOSIS — N186 End stage renal disease: Secondary | ICD-10-CM | POA: Diagnosis not present

## 2016-11-07 DIAGNOSIS — D631 Anemia in chronic kidney disease: Secondary | ICD-10-CM | POA: Diagnosis not present

## 2016-11-07 DIAGNOSIS — N2581 Secondary hyperparathyroidism of renal origin: Secondary | ICD-10-CM | POA: Diagnosis not present

## 2016-11-07 DIAGNOSIS — N186 End stage renal disease: Secondary | ICD-10-CM | POA: Diagnosis not present

## 2016-11-07 DIAGNOSIS — E119 Type 2 diabetes mellitus without complications: Secondary | ICD-10-CM | POA: Diagnosis not present

## 2016-11-09 DIAGNOSIS — N186 End stage renal disease: Secondary | ICD-10-CM | POA: Diagnosis not present

## 2016-11-09 DIAGNOSIS — E119 Type 2 diabetes mellitus without complications: Secondary | ICD-10-CM | POA: Diagnosis not present

## 2016-11-09 DIAGNOSIS — D631 Anemia in chronic kidney disease: Secondary | ICD-10-CM | POA: Diagnosis not present

## 2016-11-09 DIAGNOSIS — N2581 Secondary hyperparathyroidism of renal origin: Secondary | ICD-10-CM | POA: Diagnosis not present

## 2016-11-12 DIAGNOSIS — D631 Anemia in chronic kidney disease: Secondary | ICD-10-CM | POA: Diagnosis not present

## 2016-11-12 DIAGNOSIS — E119 Type 2 diabetes mellitus without complications: Secondary | ICD-10-CM | POA: Diagnosis not present

## 2016-11-12 DIAGNOSIS — N186 End stage renal disease: Secondary | ICD-10-CM | POA: Diagnosis not present

## 2016-11-12 DIAGNOSIS — N2581 Secondary hyperparathyroidism of renal origin: Secondary | ICD-10-CM | POA: Diagnosis not present

## 2016-11-13 DIAGNOSIS — C649 Malignant neoplasm of unspecified kidney, except renal pelvis: Secondary | ICD-10-CM | POA: Diagnosis not present

## 2016-11-14 DIAGNOSIS — D631 Anemia in chronic kidney disease: Secondary | ICD-10-CM | POA: Diagnosis not present

## 2016-11-14 DIAGNOSIS — N186 End stage renal disease: Secondary | ICD-10-CM | POA: Diagnosis not present

## 2016-11-14 DIAGNOSIS — N2581 Secondary hyperparathyroidism of renal origin: Secondary | ICD-10-CM | POA: Diagnosis not present

## 2016-11-14 DIAGNOSIS — E119 Type 2 diabetes mellitus without complications: Secondary | ICD-10-CM | POA: Diagnosis not present

## 2016-11-16 DIAGNOSIS — N186 End stage renal disease: Secondary | ICD-10-CM | POA: Diagnosis not present

## 2016-11-16 DIAGNOSIS — E119 Type 2 diabetes mellitus without complications: Secondary | ICD-10-CM | POA: Diagnosis not present

## 2016-11-16 DIAGNOSIS — N2581 Secondary hyperparathyroidism of renal origin: Secondary | ICD-10-CM | POA: Diagnosis not present

## 2016-11-16 DIAGNOSIS — D631 Anemia in chronic kidney disease: Secondary | ICD-10-CM | POA: Diagnosis not present

## 2016-11-19 DIAGNOSIS — E119 Type 2 diabetes mellitus without complications: Secondary | ICD-10-CM | POA: Diagnosis not present

## 2016-11-19 DIAGNOSIS — N2581 Secondary hyperparathyroidism of renal origin: Secondary | ICD-10-CM | POA: Diagnosis not present

## 2016-11-19 DIAGNOSIS — N186 End stage renal disease: Secondary | ICD-10-CM | POA: Diagnosis not present

## 2016-11-19 DIAGNOSIS — D631 Anemia in chronic kidney disease: Secondary | ICD-10-CM | POA: Diagnosis not present

## 2016-11-21 DIAGNOSIS — D631 Anemia in chronic kidney disease: Secondary | ICD-10-CM | POA: Diagnosis not present

## 2016-11-21 DIAGNOSIS — N2581 Secondary hyperparathyroidism of renal origin: Secondary | ICD-10-CM | POA: Diagnosis not present

## 2016-11-21 DIAGNOSIS — N186 End stage renal disease: Secondary | ICD-10-CM | POA: Diagnosis not present

## 2016-11-21 DIAGNOSIS — E119 Type 2 diabetes mellitus without complications: Secondary | ICD-10-CM | POA: Diagnosis not present

## 2016-11-23 DIAGNOSIS — N2581 Secondary hyperparathyroidism of renal origin: Secondary | ICD-10-CM | POA: Diagnosis not present

## 2016-11-23 DIAGNOSIS — E119 Type 2 diabetes mellitus without complications: Secondary | ICD-10-CM | POA: Diagnosis not present

## 2016-11-23 DIAGNOSIS — N186 End stage renal disease: Secondary | ICD-10-CM | POA: Diagnosis not present

## 2016-11-23 DIAGNOSIS — D631 Anemia in chronic kidney disease: Secondary | ICD-10-CM | POA: Diagnosis not present

## 2016-11-25 ENCOUNTER — Ambulatory Visit (INDEPENDENT_AMBULATORY_CARE_PROVIDER_SITE_OTHER): Payer: Medicare Other | Admitting: Vascular Surgery

## 2016-11-25 ENCOUNTER — Encounter (INDEPENDENT_AMBULATORY_CARE_PROVIDER_SITE_OTHER): Payer: Self-pay | Admitting: Vascular Surgery

## 2016-11-25 ENCOUNTER — Ambulatory Visit (INDEPENDENT_AMBULATORY_CARE_PROVIDER_SITE_OTHER): Payer: Medicare Other

## 2016-11-25 VITALS — BP 124/67 | HR 80 | Resp 16 | Wt 169.0 lb

## 2016-11-25 DIAGNOSIS — T829XXS Unspecified complication of cardiac and vascular prosthetic device, implant and graft, sequela: Secondary | ICD-10-CM

## 2016-11-25 DIAGNOSIS — I871 Compression of vein: Secondary | ICD-10-CM

## 2016-11-25 DIAGNOSIS — I151 Hypertension secondary to other renal disorders: Secondary | ICD-10-CM

## 2016-11-25 DIAGNOSIS — I209 Angina pectoris, unspecified: Secondary | ICD-10-CM | POA: Diagnosis not present

## 2016-11-25 DIAGNOSIS — N2889 Other specified disorders of kidney and ureter: Secondary | ICD-10-CM | POA: Diagnosis not present

## 2016-11-25 DIAGNOSIS — N186 End stage renal disease: Secondary | ICD-10-CM

## 2016-11-25 DIAGNOSIS — I25119 Atherosclerotic heart disease of native coronary artery with unspecified angina pectoris: Secondary | ICD-10-CM

## 2016-11-25 DIAGNOSIS — I2510 Atherosclerotic heart disease of native coronary artery without angina pectoris: Secondary | ICD-10-CM

## 2016-11-25 NOTE — Progress Notes (Signed)
MRN : [id number]  Kelli Nguyen is a 71 y.o. (01/20/1946) female who presents with chief complaint of  Chief Complaint  Patient presents with  . Follow-up    HDA  .  History of Present Illness:   The patient returns to the office for followup status post intervention of the dialysis access:  PROCEDURE 10/16/2016: 1. Contrast injection left arm hero  AV access 2. Percutaneous transluminal angioplasty to 8 mm venous portion left arm hero graft   Following the intervention the access function has significantly improved, with better flow rates and improved KT/V. The patient has not been experiencing increased bleeding times following decannulation and the patient denies increased recirculation. The patient denies an increase in arm swelling. At the present time the patient denies hand pain.  The patient denies amaurosis fugax or recent TIA symptoms. There are no recent neurological changes noted. The patient denies claudication symptoms or rest pain symptoms. The patient denies history of DVT, PE or superficial thrombophlebitis. The patient denies recent episodes of angina or shortness of breath.   Duplex ultrasound of the AV access shows a patent access with uniform velocities.  No focal hemodynamically significant stenosis.    Current Meds  Medication Sig  . amitriptyline (ELAVIL) 25 MG tablet Take 25 mg by mouth.  [name] aspirin 81 MG tablet Take 81 mg by mouth daily.  . cinacalcet (SENSIPAR) 30 MG tablet Take 30 mg by mouth 3 (three) times a week. On dialysis days - Tues, Thurs, Sat  . DiphenhydrAMINE HCl, Sleep, 50 MG tablet Take 1 tablet by mouth 30 minutes before CT scan  . DUREZOL 0.05 % EMUL Place 1 drop into both eyes 2 (two) times daily.  . isosorbide mononitrate (IMDUR) 30 MG 24 hr tablet Take 30 mg by mouth daily.  [name] latanoprost (XALATAN) 0.005 % ophthalmic solution Place 1 drop into both eyes 2 (two) times daily.   . metoprolol succinate (TOPROL-XL) 25 MG 24 hr tablet  Take 25 mg by mouth daily.  . pravastatin (PRAVACHOL) 20 MG tablet Take 20 mg by mouth daily.  . predniSONE (DELTASONE) 50 MG tablet Take 50 mg by mouth once.  . ranitidine (ZANTAC) 150 MG tablet Take 150 mg by mouth 2 (two) times daily.  . sevelamer carbonate (RENVELA) 800 MG tablet Take 1,600 mg by mouth 3 (three) times daily with meals.   . timolol (TIMOPTIC) 0.5 % ophthalmic solution Administer 1 drop to both eyes daily.     Past Medical History:  Diagnosis Date  . Anemia   . Chronic cough   . Chronic kidney disease   . Coronary artery disease   . Diabetes mellitus without complication ([place])   . Dialysis patient ([place])    T-TH-SAT  . GERD (gastroesophageal reflux disease)   . Hypertension   . Neuropathy    hands  . Wears dentures    partial upper and lower    Past Surgical History:  Procedure Laterality Date  . A/V SHUNT INTERVENTION N/A 10/16/2016   Procedure: A/V Shunt Intervention;  Surgeon: Katha Cabal, MD;  Location: [place] [facility];  Service: Cardiovascular;  Laterality: N/A;  . A/V SHUNTOGRAM Left 10/16/2016   Procedure: A/V Shuntogram;  Surgeon: Katha Cabal, MD;  Location: [place] [facility];  Service: Cardiovascular;  Laterality: Left;  . ABDOMINAL HYSTERECTOMY    . CARPAL TUNNEL RELEASE Left 09/27/2015   Procedure: CARPAL TUNNEL RELEASE;  Surgeon: Leanor Kail, MD;  Location: [facility] ORS;  Service:  Orthopedics;  Laterality: Left;  . CATARACT EXTRACTION W/PHACO Right 09/23/2016   Procedure: CATARACT EXTRACTION PHACO AND INTRAOCULAR LENS PLACEMENT (IOC) complicated diabetic right;  Surgeon: Ronnell Freshwater, MD;  Location: [place];  Service: Ophthalmology;  Laterality: Right;  Diabetic - diet controlled Potassium draw before surgery  . COLONOSCOPY    . COLONOSCOPY WITH PROPOFOL N/A 11/28/2014   Procedure: COLONOSCOPY WITH PROPOFOL;  Surgeon: Manya Silvas, MD;  Location: [facility] ENDOSCOPY;  Service: Endoscopy;  Laterality: N/A;    . CORONARY ARTERY BYPASS GRAFT    . PERIPHERAL VASCULAR CATHETERIZATION Left 01/18/2015   Procedure: A/V Shuntogram/Fistulagram;  Surgeon: Katha Cabal, MD;  Location: [place] [facility];  Service: Cardiovascular;  Laterality: Left;  . PERIPHERAL VASCULAR CATHETERIZATION Left 01/18/2015   Procedure: A/V Shunt Intervention;  Surgeon: Katha Cabal, MD;  Location: [place] [facility];  Service: Cardiovascular;  Laterality: Left;  . PERIPHERAL VASCULAR CATHETERIZATION N/A 08/10/2015   Procedure: Thrombectomy;  Surgeon: Algernon Huxley, MD;  Location: [place] [facility];  Service: Cardiovascular;  Laterality: N/A;  . PERIPHERAL VASCULAR CATHETERIZATION N/A 08/10/2015   Procedure: A/V Shuntogram/Fistulagram;  Surgeon: Algernon Huxley, MD;  Location: [place] [facility];  Service: Cardiovascular;  Laterality: N/A;  . PERIPHERAL VASCULAR CATHETERIZATION N/A 08/10/2015   Procedure: A/V Shunt Intervention;  Surgeon: Algernon Huxley, MD;  Location: [place] [facility];  Service: Cardiovascular;  Laterality: N/A;  . RENAL SHUNTS      Social History Social History  Substance Use Topics  . Smoking status: Never Smoker  . Smokeless tobacco: Never Used  . Alcohol use No    Family History No family history on file.  Allergies  Allergen Reactions  . Codeine Sulfate Nausea Only  . Contrast Media [Iodinated Diagnostic Agents] Hives  . Gadobutrol Hives  . Gadolinium Derivatives Hives  . Penicillins Hives and Itching    Has patient had a PCN reaction causing immediate rash, facial/tongue/throat swelling, SOB or lightheadedness with hypotension: No Has patient had a PCN reaction causing severe rash involving mucus membranes or skin necrosis: No Has patient had a PCN reaction that required hospitalization: No Has patient had a PCN reaction occurring within the last 10 years: No If all of the above answers are "NO", then may proceed with Cephalosporin use.   . Statins Other (See Comments)    muscle pain      REVIEW OF SYSTEMS (Negative unless checked)  Constitutional: [] Weight loss  [] Fever  [] Chills Cardiac: [] Chest pain   [] Chest pressure   [] Palpitations   [] Shortness of breath when laying flat   [] Shortness of breath with exertion. Vascular:  [] Pain in legs with walking   [] Pain in legs at rest  [] History of DVT   [] Phlebitis   [] Swelling in legs   [] Varicose veins   [] Non-healing ulcers Pulmonary:   [] Uses home oxygen   [] Productive cough   [] Hemoptysis   [] Wheeze  [] COPD   [] Asthma Neurologic:  [] Dizziness   [] Seizures   [] History of stroke   [] History of TIA  [] Aphasia   [] Vissual changes   [] Weakness or numbness in arm   [] Weakness or numbness in leg Musculoskeletal:   [] Joint swelling   [] Joint pain   [] Low back pain Hematologic:  [] Easy bruising  [] Easy bleeding   [] Hypercoagulable state   [] Anemic Gastrointestinal:  [] Diarrhea   [] Vomiting  [] Gastroesophageal reflux/heartburn   [] Difficulty swallowing. Genitourinary:  [x] Chronic kidney disease   [] Difficult urination  [] Frequent urination   [] Blood  in urine Skin:  [] Rashes   [] Ulcers  Psychological:  [] History of anxiety   []  History of major depression.  Physical Examination  Vitals:   11/25/16 1527  BP: 124/67  Pulse: 80  Resp: 16  Weight: 169 lb (76.7 kg)   Body mass index is 34.13 kg/m. Gen: WD/WN, NAD Head: [place]/AT, No temporalis wasting.  Ear/Nose/Throat: Hearing grossly intact, nares w/o erythema or drainage Eyes: PER, EOMI, sclera nonicteric.  Neck: Supple, no large masses.   Pulmonary:  Good air movement, no audible wheezing bilaterally, no use of accessory muscles.  Cardiac: RRR, no JVD Vascular:  Left arm AV graft good thrill good bruit.  Skin over the graft remains healthy no ulcers Vessel Right Left  Radial Palpable Palpable  Ulnar Palpable Palpable  Brachial Palpable Palpable  Gastrointestinal: Non-distended. No guarding/no peritoneal signs.  Musculoskeletal: M/S 5/5 throughout.  No deformity or  atrophy.  Neurologic: CN 2-12 intact. Symmetrical.  Speech is fluent. Motor exam as listed above. Psychiatric: Judgment intact, Mood & affect appropriate for pt's clinical situation. Dermatologic: No rashes or ulcers noted.  No changes consistent with cellulitis. Lymph : No lichenification or skin changes of chronic lymphedema.  CBC Lab Results  Component Value Date   WBC 8.3 09/26/2015   HGB 13.3 09/27/2015   HCT 39.0 09/27/2015   MCV 81.1 09/26/2015   PLT 135 (L) 09/26/2015    BMET    Component Value Date/Time   NA 137 09/27/2015 0900   NA 135 (L) 03/29/2013 1409   K 5.1 09/23/2016 0809   K 4.5 03/29/2013 1409   CL 99 (L) 09/26/2015 1517   CL 97 (L) 03/29/2013 1409   CO2 32 09/26/2015 1517   CO2 27 03/29/2013 1409   GLUCOSE 102 (H) 09/27/2015 0900   GLUCOSE 97 03/29/2013 1409   BUN 28 (H) 09/26/2015 1517   BUN 48 (H) 03/29/2013 1409   CREATININE 7.13 (H) 09/26/2015 1517   CREATININE 12.06 (H) 03/29/2013 1409   CALCIUM 8.5 (L) 09/26/2015 1517   CALCIUM 8.8 03/29/2013 1409   GFRNONAA 5 (L) 09/26/2015 1517   GFRNONAA 3 (L) 03/29/2013 1409   GFRAA 6 (L) 09/26/2015 1517   GFRAA 3 (L) 03/29/2013 1409   CrCl cannot be calculated (Patient's most recent lab result is older than the maximum 21 days allowed.).  COAG No results found for: INR, PROTIME  Radiology No results found.  Assessment/Plan 1. Complication of vascular access for dialysis, sequela Recommend:  The patient is doing well and currently has adequate dialysis access. The patient's dialysis center is not reporting any access issues. Flow pattern is stable when compared to the prior ultrasound.  The patient should have a duplex ultrasound of the dialysis access in 6 months. The patient will follow-up with me in the office after each ultrasound    - VAS [place] (AVF,AVG); Future  2. End stage renal disease ([place]) Continue dialysis as arranged without interuption  3. Superior vena  cava compression syndrome Patient with HERO graft  4. Hypertension secondary to other renal disorders Continue antihypertensive medications as already ordered, these medications have been reviewed and there are no changes at this time.   5. Coronary artery disease involving native coronary artery of native heart with angina pectoris ([place]) Continue cardiac and antihypertensive medications as already ordered and reviewed, no changes at this time.  Continue statin as ordered and reviewed, no changes at this time  Nitrates PRN for chest pain    Hortencia Pilar,  MD  11/25/2016 3:35 PM

## 2016-11-26 DIAGNOSIS — D631 Anemia in chronic kidney disease: Secondary | ICD-10-CM | POA: Diagnosis not present

## 2016-11-26 DIAGNOSIS — N186 End stage renal disease: Secondary | ICD-10-CM | POA: Diagnosis not present

## 2016-11-26 DIAGNOSIS — N2581 Secondary hyperparathyroidism of renal origin: Secondary | ICD-10-CM | POA: Diagnosis not present

## 2016-11-26 DIAGNOSIS — E119 Type 2 diabetes mellitus without complications: Secondary | ICD-10-CM | POA: Diagnosis not present

## 2016-11-28 DIAGNOSIS — D631 Anemia in chronic kidney disease: Secondary | ICD-10-CM | POA: Diagnosis not present

## 2016-11-28 DIAGNOSIS — N186 End stage renal disease: Secondary | ICD-10-CM | POA: Diagnosis not present

## 2016-11-28 DIAGNOSIS — N2581 Secondary hyperparathyroidism of renal origin: Secondary | ICD-10-CM | POA: Diagnosis not present

## 2016-11-28 DIAGNOSIS — E119 Type 2 diabetes mellitus without complications: Secondary | ICD-10-CM | POA: Diagnosis not present

## 2016-11-30 DIAGNOSIS — N2581 Secondary hyperparathyroidism of renal origin: Secondary | ICD-10-CM | POA: Diagnosis not present

## 2016-11-30 DIAGNOSIS — N186 End stage renal disease: Secondary | ICD-10-CM | POA: Diagnosis not present

## 2016-11-30 DIAGNOSIS — D631 Anemia in chronic kidney disease: Secondary | ICD-10-CM | POA: Diagnosis not present

## 2016-11-30 DIAGNOSIS — E119 Type 2 diabetes mellitus without complications: Secondary | ICD-10-CM | POA: Diagnosis not present

## 2016-12-03 DIAGNOSIS — E1122 Type 2 diabetes mellitus with diabetic chronic kidney disease: Secondary | ICD-10-CM | POA: Diagnosis not present

## 2016-12-03 DIAGNOSIS — Z992 Dependence on renal dialysis: Secondary | ICD-10-CM | POA: Diagnosis not present

## 2016-12-03 DIAGNOSIS — E119 Type 2 diabetes mellitus without complications: Secondary | ICD-10-CM | POA: Diagnosis not present

## 2016-12-03 DIAGNOSIS — D631 Anemia in chronic kidney disease: Secondary | ICD-10-CM | POA: Diagnosis not present

## 2016-12-03 DIAGNOSIS — N2581 Secondary hyperparathyroidism of renal origin: Secondary | ICD-10-CM | POA: Diagnosis not present

## 2016-12-03 DIAGNOSIS — N186 End stage renal disease: Secondary | ICD-10-CM | POA: Diagnosis not present

## 2016-12-05 DIAGNOSIS — E119 Type 2 diabetes mellitus without complications: Secondary | ICD-10-CM | POA: Diagnosis not present

## 2016-12-05 DIAGNOSIS — D631 Anemia in chronic kidney disease: Secondary | ICD-10-CM | POA: Diagnosis not present

## 2016-12-05 DIAGNOSIS — N2581 Secondary hyperparathyroidism of renal origin: Secondary | ICD-10-CM | POA: Diagnosis not present

## 2016-12-05 DIAGNOSIS — N186 End stage renal disease: Secondary | ICD-10-CM | POA: Diagnosis not present

## 2016-12-07 DIAGNOSIS — D631 Anemia in chronic kidney disease: Secondary | ICD-10-CM | POA: Diagnosis not present

## 2016-12-07 DIAGNOSIS — N186 End stage renal disease: Secondary | ICD-10-CM | POA: Diagnosis not present

## 2016-12-07 DIAGNOSIS — E119 Type 2 diabetes mellitus without complications: Secondary | ICD-10-CM | POA: Diagnosis not present

## 2016-12-07 DIAGNOSIS — N2581 Secondary hyperparathyroidism of renal origin: Secondary | ICD-10-CM | POA: Diagnosis not present

## 2016-12-10 DIAGNOSIS — N2581 Secondary hyperparathyroidism of renal origin: Secondary | ICD-10-CM | POA: Diagnosis not present

## 2016-12-10 DIAGNOSIS — N186 End stage renal disease: Secondary | ICD-10-CM | POA: Diagnosis not present

## 2016-12-10 DIAGNOSIS — D631 Anemia in chronic kidney disease: Secondary | ICD-10-CM | POA: Diagnosis not present

## 2016-12-10 DIAGNOSIS — E119 Type 2 diabetes mellitus without complications: Secondary | ICD-10-CM | POA: Diagnosis not present

## 2016-12-12 DIAGNOSIS — N2581 Secondary hyperparathyroidism of renal origin: Secondary | ICD-10-CM | POA: Diagnosis not present

## 2016-12-12 DIAGNOSIS — D631 Anemia in chronic kidney disease: Secondary | ICD-10-CM | POA: Diagnosis not present

## 2016-12-12 DIAGNOSIS — N186 End stage renal disease: Secondary | ICD-10-CM | POA: Diagnosis not present

## 2016-12-12 DIAGNOSIS — E119 Type 2 diabetes mellitus without complications: Secondary | ICD-10-CM | POA: Diagnosis not present

## 2016-12-14 DIAGNOSIS — N2581 Secondary hyperparathyroidism of renal origin: Secondary | ICD-10-CM | POA: Diagnosis not present

## 2016-12-14 DIAGNOSIS — E119 Type 2 diabetes mellitus without complications: Secondary | ICD-10-CM | POA: Diagnosis not present

## 2016-12-14 DIAGNOSIS — D631 Anemia in chronic kidney disease: Secondary | ICD-10-CM | POA: Diagnosis not present

## 2016-12-14 DIAGNOSIS — N186 End stage renal disease: Secondary | ICD-10-CM | POA: Diagnosis not present

## 2016-12-17 DIAGNOSIS — N186 End stage renal disease: Secondary | ICD-10-CM | POA: Diagnosis not present

## 2016-12-17 DIAGNOSIS — E119 Type 2 diabetes mellitus without complications: Secondary | ICD-10-CM | POA: Diagnosis not present

## 2016-12-17 DIAGNOSIS — D631 Anemia in chronic kidney disease: Secondary | ICD-10-CM | POA: Diagnosis not present

## 2016-12-17 DIAGNOSIS — N2581 Secondary hyperparathyroidism of renal origin: Secondary | ICD-10-CM | POA: Diagnosis not present

## 2016-12-19 DIAGNOSIS — N186 End stage renal disease: Secondary | ICD-10-CM | POA: Diagnosis not present

## 2016-12-19 DIAGNOSIS — D631 Anemia in chronic kidney disease: Secondary | ICD-10-CM | POA: Diagnosis not present

## 2016-12-19 DIAGNOSIS — N2581 Secondary hyperparathyroidism of renal origin: Secondary | ICD-10-CM | POA: Diagnosis not present

## 2016-12-19 DIAGNOSIS — E119 Type 2 diabetes mellitus without complications: Secondary | ICD-10-CM | POA: Diagnosis not present

## 2016-12-21 DIAGNOSIS — D631 Anemia in chronic kidney disease: Secondary | ICD-10-CM | POA: Diagnosis not present

## 2016-12-21 DIAGNOSIS — N2581 Secondary hyperparathyroidism of renal origin: Secondary | ICD-10-CM | POA: Diagnosis not present

## 2016-12-21 DIAGNOSIS — N186 End stage renal disease: Secondary | ICD-10-CM | POA: Diagnosis not present

## 2016-12-21 DIAGNOSIS — E119 Type 2 diabetes mellitus without complications: Secondary | ICD-10-CM | POA: Diagnosis not present

## 2016-12-24 DIAGNOSIS — N2581 Secondary hyperparathyroidism of renal origin: Secondary | ICD-10-CM | POA: Diagnosis not present

## 2016-12-24 DIAGNOSIS — D631 Anemia in chronic kidney disease: Secondary | ICD-10-CM | POA: Diagnosis not present

## 2016-12-24 DIAGNOSIS — E119 Type 2 diabetes mellitus without complications: Secondary | ICD-10-CM | POA: Diagnosis not present

## 2016-12-24 DIAGNOSIS — N186 End stage renal disease: Secondary | ICD-10-CM | POA: Diagnosis not present

## 2016-12-26 DIAGNOSIS — D631 Anemia in chronic kidney disease: Secondary | ICD-10-CM | POA: Diagnosis not present

## 2016-12-26 DIAGNOSIS — N2581 Secondary hyperparathyroidism of renal origin: Secondary | ICD-10-CM | POA: Diagnosis not present

## 2016-12-26 DIAGNOSIS — N186 End stage renal disease: Secondary | ICD-10-CM | POA: Diagnosis not present

## 2016-12-26 DIAGNOSIS — E119 Type 2 diabetes mellitus without complications: Secondary | ICD-10-CM | POA: Diagnosis not present

## 2016-12-28 DIAGNOSIS — N2581 Secondary hyperparathyroidism of renal origin: Secondary | ICD-10-CM | POA: Diagnosis not present

## 2016-12-28 DIAGNOSIS — N186 End stage renal disease: Secondary | ICD-10-CM | POA: Diagnosis not present

## 2016-12-28 DIAGNOSIS — E119 Type 2 diabetes mellitus without complications: Secondary | ICD-10-CM | POA: Diagnosis not present

## 2016-12-28 DIAGNOSIS — D631 Anemia in chronic kidney disease: Secondary | ICD-10-CM | POA: Diagnosis not present

## 2016-12-31 DIAGNOSIS — N2581 Secondary hyperparathyroidism of renal origin: Secondary | ICD-10-CM | POA: Diagnosis not present

## 2016-12-31 DIAGNOSIS — D631 Anemia in chronic kidney disease: Secondary | ICD-10-CM | POA: Diagnosis not present

## 2016-12-31 DIAGNOSIS — N186 End stage renal disease: Secondary | ICD-10-CM | POA: Diagnosis not present

## 2016-12-31 DIAGNOSIS — E119 Type 2 diabetes mellitus without complications: Secondary | ICD-10-CM | POA: Diagnosis not present

## 2017-01-02 DIAGNOSIS — N2581 Secondary hyperparathyroidism of renal origin: Secondary | ICD-10-CM | POA: Diagnosis not present

## 2017-01-02 DIAGNOSIS — E119 Type 2 diabetes mellitus without complications: Secondary | ICD-10-CM | POA: Diagnosis not present

## 2017-01-02 DIAGNOSIS — N186 End stage renal disease: Secondary | ICD-10-CM | POA: Diagnosis not present

## 2017-01-02 DIAGNOSIS — D631 Anemia in chronic kidney disease: Secondary | ICD-10-CM | POA: Diagnosis not present

## 2017-01-03 DIAGNOSIS — E1122 Type 2 diabetes mellitus with diabetic chronic kidney disease: Secondary | ICD-10-CM | POA: Diagnosis not present

## 2017-01-03 DIAGNOSIS — Z992 Dependence on renal dialysis: Secondary | ICD-10-CM | POA: Diagnosis not present

## 2017-01-03 DIAGNOSIS — N186 End stage renal disease: Secondary | ICD-10-CM | POA: Diagnosis not present

## 2017-01-04 DIAGNOSIS — N2581 Secondary hyperparathyroidism of renal origin: Secondary | ICD-10-CM | POA: Diagnosis not present

## 2017-01-04 DIAGNOSIS — E119 Type 2 diabetes mellitus without complications: Secondary | ICD-10-CM | POA: Diagnosis not present

## 2017-01-04 DIAGNOSIS — N186 End stage renal disease: Secondary | ICD-10-CM | POA: Diagnosis not present

## 2017-01-04 DIAGNOSIS — D631 Anemia in chronic kidney disease: Secondary | ICD-10-CM | POA: Diagnosis not present

## 2017-01-04 DIAGNOSIS — Z23 Encounter for immunization: Secondary | ICD-10-CM | POA: Diagnosis not present

## 2017-01-07 DIAGNOSIS — D631 Anemia in chronic kidney disease: Secondary | ICD-10-CM | POA: Diagnosis not present

## 2017-01-07 DIAGNOSIS — N2581 Secondary hyperparathyroidism of renal origin: Secondary | ICD-10-CM | POA: Diagnosis not present

## 2017-01-07 DIAGNOSIS — N186 End stage renal disease: Secondary | ICD-10-CM | POA: Diagnosis not present

## 2017-01-07 DIAGNOSIS — Z23 Encounter for immunization: Secondary | ICD-10-CM | POA: Diagnosis not present

## 2017-01-07 DIAGNOSIS — E119 Type 2 diabetes mellitus without complications: Secondary | ICD-10-CM | POA: Diagnosis not present

## 2017-01-09 DIAGNOSIS — Z23 Encounter for immunization: Secondary | ICD-10-CM | POA: Diagnosis not present

## 2017-01-09 DIAGNOSIS — N186 End stage renal disease: Secondary | ICD-10-CM | POA: Diagnosis not present

## 2017-01-09 DIAGNOSIS — E119 Type 2 diabetes mellitus without complications: Secondary | ICD-10-CM | POA: Diagnosis not present

## 2017-01-09 DIAGNOSIS — N2581 Secondary hyperparathyroidism of renal origin: Secondary | ICD-10-CM | POA: Diagnosis not present

## 2017-01-09 DIAGNOSIS — D631 Anemia in chronic kidney disease: Secondary | ICD-10-CM | POA: Diagnosis not present

## 2017-01-11 DIAGNOSIS — N186 End stage renal disease: Secondary | ICD-10-CM | POA: Diagnosis not present

## 2017-01-11 DIAGNOSIS — E119 Type 2 diabetes mellitus without complications: Secondary | ICD-10-CM | POA: Diagnosis not present

## 2017-01-11 DIAGNOSIS — Z23 Encounter for immunization: Secondary | ICD-10-CM | POA: Diagnosis not present

## 2017-01-11 DIAGNOSIS — D631 Anemia in chronic kidney disease: Secondary | ICD-10-CM | POA: Diagnosis not present

## 2017-01-11 DIAGNOSIS — N2581 Secondary hyperparathyroidism of renal origin: Secondary | ICD-10-CM | POA: Diagnosis not present

## 2017-01-13 DIAGNOSIS — H2512 Age-related nuclear cataract, left eye: Secondary | ICD-10-CM | POA: Diagnosis not present

## 2017-01-14 DIAGNOSIS — N2581 Secondary hyperparathyroidism of renal origin: Secondary | ICD-10-CM | POA: Diagnosis not present

## 2017-01-14 DIAGNOSIS — N186 End stage renal disease: Secondary | ICD-10-CM | POA: Diagnosis not present

## 2017-01-14 DIAGNOSIS — Z23 Encounter for immunization: Secondary | ICD-10-CM | POA: Diagnosis not present

## 2017-01-14 DIAGNOSIS — E119 Type 2 diabetes mellitus without complications: Secondary | ICD-10-CM | POA: Diagnosis not present

## 2017-01-14 DIAGNOSIS — D631 Anemia in chronic kidney disease: Secondary | ICD-10-CM | POA: Diagnosis not present

## 2017-01-15 ENCOUNTER — Encounter: Payer: Self-pay | Admitting: *Deleted

## 2017-01-15 NOTE — Discharge Instructions (Signed)

## 2017-01-16 DIAGNOSIS — N2581 Secondary hyperparathyroidism of renal origin: Secondary | ICD-10-CM | POA: Diagnosis not present

## 2017-01-16 DIAGNOSIS — E119 Type 2 diabetes mellitus without complications: Secondary | ICD-10-CM | POA: Diagnosis not present

## 2017-01-16 DIAGNOSIS — N186 End stage renal disease: Secondary | ICD-10-CM | POA: Diagnosis not present

## 2017-01-16 DIAGNOSIS — Z23 Encounter for immunization: Secondary | ICD-10-CM | POA: Diagnosis not present

## 2017-01-16 DIAGNOSIS — D631 Anemia in chronic kidney disease: Secondary | ICD-10-CM | POA: Diagnosis not present

## 2017-01-18 DIAGNOSIS — Z23 Encounter for immunization: Secondary | ICD-10-CM | POA: Diagnosis not present

## 2017-01-18 DIAGNOSIS — N2581 Secondary hyperparathyroidism of renal origin: Secondary | ICD-10-CM | POA: Diagnosis not present

## 2017-01-18 DIAGNOSIS — E119 Type 2 diabetes mellitus without complications: Secondary | ICD-10-CM | POA: Diagnosis not present

## 2017-01-18 DIAGNOSIS — N186 End stage renal disease: Secondary | ICD-10-CM | POA: Diagnosis not present

## 2017-01-18 DIAGNOSIS — D631 Anemia in chronic kidney disease: Secondary | ICD-10-CM | POA: Diagnosis not present

## 2017-01-20 ENCOUNTER — Ambulatory Visit: Payer: Medicare Other | Admitting: Anesthesiology

## 2017-01-20 ENCOUNTER — Other Ambulatory Visit
Admission: RE | Admit: 2017-01-20 | Discharge: 2017-01-20 | Disposition: A | Discharge status: Home / Self Care | Payer: Medicare Other | Source: Ambulatory Visit | Attending: Anesthesiology | Admitting: Anesthesiology

## 2017-01-20 ENCOUNTER — Encounter
Admission: RE | Disposition: A | Discharge status: Home / Self Care | Payer: Self-pay | Source: Ambulatory Visit | Attending: Ophthalmology

## 2017-01-20 ENCOUNTER — Ambulatory Visit
Admission: RE | Admit: 2017-01-20 | Discharge: 2017-01-20 | Disposition: A | Discharge status: Home / Self Care | Payer: Medicare Other | Source: Ambulatory Visit | Attending: Ophthalmology | Admitting: Ophthalmology

## 2017-01-20 DIAGNOSIS — M25561 Pain in right knee: Secondary | ICD-10-CM | POA: Insufficient documentation

## 2017-01-20 DIAGNOSIS — R609 Edema, unspecified: Secondary | ICD-10-CM | POA: Insufficient documentation

## 2017-01-20 DIAGNOSIS — F419 Anxiety disorder, unspecified: Secondary | ICD-10-CM | POA: Insufficient documentation

## 2017-01-20 DIAGNOSIS — H2512 Age-related nuclear cataract, left eye: Secondary | ICD-10-CM | POA: Diagnosis not present

## 2017-01-20 DIAGNOSIS — Z880 Allergy status to penicillin: Secondary | ICD-10-CM | POA: Insufficient documentation

## 2017-01-20 DIAGNOSIS — Z955 Presence of coronary angioplasty implant and graft: Secondary | ICD-10-CM | POA: Diagnosis not present

## 2017-01-20 DIAGNOSIS — E669 Obesity, unspecified: Secondary | ICD-10-CM | POA: Insufficient documentation

## 2017-01-20 DIAGNOSIS — M25562 Pain in left knee: Secondary | ICD-10-CM | POA: Diagnosis not present

## 2017-01-20 DIAGNOSIS — G629 Polyneuropathy, unspecified: Secondary | ICD-10-CM | POA: Insufficient documentation

## 2017-01-20 DIAGNOSIS — Z992 Dependence on renal dialysis: Secondary | ICD-10-CM | POA: Insufficient documentation

## 2017-01-20 DIAGNOSIS — D631 Anemia in chronic kidney disease: Secondary | ICD-10-CM | POA: Diagnosis not present

## 2017-01-20 DIAGNOSIS — Z91041 Radiographic dye allergy status: Secondary | ICD-10-CM | POA: Insufficient documentation

## 2017-01-20 DIAGNOSIS — E1122 Type 2 diabetes mellitus with diabetic chronic kidney disease: Secondary | ICD-10-CM | POA: Diagnosis not present

## 2017-01-20 DIAGNOSIS — N186 End stage renal disease: Secondary | ICD-10-CM | POA: Diagnosis not present

## 2017-01-20 DIAGNOSIS — Z9841 Cataract extraction status, right eye: Secondary | ICD-10-CM | POA: Diagnosis not present

## 2017-01-20 DIAGNOSIS — E7800 Pure hypercholesterolemia, unspecified: Secondary | ICD-10-CM | POA: Diagnosis not present

## 2017-01-20 DIAGNOSIS — I493 Ventricular premature depolarization: Secondary | ICD-10-CM | POA: Insufficient documentation

## 2017-01-20 DIAGNOSIS — E1136 Type 2 diabetes mellitus with diabetic cataract: Secondary | ICD-10-CM | POA: Diagnosis not present

## 2017-01-20 DIAGNOSIS — I120 Hypertensive chronic kidney disease with stage 5 chronic kidney disease or end stage renal disease: Secondary | ICD-10-CM | POA: Insufficient documentation

## 2017-01-20 DIAGNOSIS — I4510 Unspecified right bundle-branch block: Secondary | ICD-10-CM | POA: Diagnosis not present

## 2017-01-20 HISTORY — PX: CATARACT EXTRACTION W/PHACO: SHX586

## 2017-01-20 LAB — POTASSIUM: POTASSIUM: 4.7 mmol/L (ref 3.5–5.1)

## 2017-01-20 LAB — GLUCOSE, CAPILLARY: GLUCOSE-CAPILLARY: 96 mg/dL (ref 65–99)

## 2017-01-20 SURGERY — PHACOEMULSIFICATION, CATARACT, WITH IOL INSERTION
Anesthesia: Monitor Anesthesia Care | Laterality: Left | Wound class: Clean

## 2017-01-20 MED ORDER — SODIUM HYALURONATE 10 MG/ML IO SOLN
INTRAOCULAR | Status: DC | PRN
  Administered 2017-01-20: 0.55 mL via INTRAOCULAR

## 2017-01-20 MED ORDER — FENTANYL CITRATE (PF) 100 MCG/2ML IJ SOLN
INTRAMUSCULAR | Status: DC | PRN
  Administered 2017-01-20: 50 ug via INTRAVENOUS

## 2017-01-20 MED ORDER — ACETAMINOPHEN 160 MG/5ML PO SOLN: 325.0000 mg | ORAL | Status: DC | PRN

## 2017-01-20 MED ORDER — LACTATED RINGERS IV SOLN: 10.0000 mL/h | INTRAVENOUS | Status: DC

## 2017-01-20 MED ORDER — ONDANSETRON HCL 4 MG/2ML IJ SOLN: 4.0000 mg | Freq: Once | INTRAMUSCULAR | Status: DC | PRN

## 2017-01-20 MED ORDER — SODIUM HYALURONATE 23 MG/ML IO SOLN
INTRAOCULAR | Status: DC | PRN
  Administered 2017-01-20: 0.6 mL via INTRAOCULAR

## 2017-01-20 MED ORDER — ARMC OPHTHALMIC DILATING DROPS
1.0000 "application " | OPHTHALMIC | Status: DC | PRN
  Administered 2017-01-20 (×3): 1 via OPHTHALMIC

## 2017-01-20 MED ORDER — ACETAMINOPHEN 325 MG PO TABS: 325.0000 mg | ORAL_TABLET | ORAL | Status: DC | PRN

## 2017-01-20 MED ORDER — LIDOCAINE HCL (PF) 4 % IJ SOLN
INTRAOCULAR | Status: DC | PRN
  Administered 2017-01-20: 1 mL via OPHTHALMIC

## 2017-01-20 MED ORDER — MIDAZOLAM HCL 2 MG/2ML IJ SOLN
INTRAMUSCULAR | Status: DC | PRN
  Administered 2017-01-20: 2 mg via INTRAVENOUS

## 2017-01-20 MED ORDER — TRYPAN BLUE 0.06 % OP SOLN
OPHTHALMIC | Status: DC | PRN
  Administered 2017-01-20: 0.5 mL via INTRAOCULAR

## 2017-01-20 MED ORDER — MOXIFLOXACIN HCL 0.5 % OP SOLN
OPHTHALMIC | Status: DC | PRN
  Administered 2017-01-20: 0.2 mL via OPHTHALMIC

## 2017-01-20 SURGICAL SUPPLY — 16 items

## 2017-01-20 NOTE — Op Note (Signed)
OPERATIVE NOTE  Kelli Nguyen [id number] 01/20/2017   PREOPERATIVE DIAGNOSIS:  Nuclear sclerotic cataract left eye.  H25.12   POSTOPERATIVE DIAGNOSIS:    Nuclear sclerotic cataract left eye.     PROCEDURE:  Phacoemusification with posterior chamber intraocular lens placement of the left eye   LENS:   Implant Name Type Inv. Item Serial No. Manufacturer Lot No. LRB No. Used  LENS IOL ACRYSOF IQ 21.0 - [id number] Intraocular Lens LENS IOL ACRYSOF IQ 21.0 [id number] [employer]   Left 1       AU00T0 +21.0 D IOL   ULTRASOUND TIME: 0 minutes 52 seconds.  CDE 7.98   SURGEON:  Benay Pillow, MD, MPH   ANESTHESIA:  Topical with tetracaine drops augmented with 1% preservative-free intracameral lidocaine.  ESTIMATED BLOOD LOSS: <1 mL   COMPLICATIONS:  None.   DESCRIPTION OF PROCEDURE:  The patient was identified in the holding room and transported to the operating room and placed in the supine position under the operating microscope.  The left eye was identified as the operative eye and it was prepped and draped in the usual sterile ophthalmic fashion.   A 1.0 millimeter clear-corneal paracentesis was made at the 5:00 position. 0.5 ml of preservative-free 1% lidocaine with epinephrine was injected into the anterior chamber.  There was a poor red reflex, so Trypan blue was instilled under an air bubble and rinsed out with BSS to stain the capsule for improved visualization.   The anterior chamber was filled with Healon 5 viscoelastic.  A 2.4 millimeter keratome was used to make a near-clear corneal incision at the 2:00 position.  A curvilinear capsulorrhexis was made with a cystotome and capsulorrhexis forceps.  Balanced salt solution was used to hydrodissect and hydrodelineate the nucleus.   Phacoemulsification was then used in stop and chop fashion to remove the lens nucleus and epinucleus.  The remaining cortex was then removed using the irrigation and aspiration handpiece. Healon was then  placed into the capsular bag to distend it for lens placement.  A lens was then injected into the capsular bag.  The remaining viscoelastic was aspirated.   Wounds were hydrated with balanced salt solution.  The anterior chamber was inflated to a physiologic pressure with balanced salt solution.  Intracameral vigamox 0.1 mL undiltued was injected into the eye and a drop placed onto the ocular surface.  No wound leaks were noted.  The patient was taken to the recovery room in stable condition without complications of anesthesia or surgery  Benay Pillow 01/20/2017, 10:51 AM

## 2017-01-20 NOTE — Anesthesia Procedure Notes (Signed)
Procedure Name: MAC Performed by: Johnmark Geiger Pre-anesthesia Checklist: Patient identified, Emergency Drugs available, Suction available, Timeout performed and Patient being monitored Patient Re-evaluated:Patient Re-evaluated prior to inductionOxygen Delivery Method: Nasal cannula Placement Confirmation: positive ETCO2     

## 2017-01-20 NOTE — Transfer of Care (Signed)
Immediate Anesthesia Transfer of Care Note  Patient: Kelli Nguyen  Procedure(s) Performed: Procedure(s) with comments: CATARACT EXTRACTION PHACO AND INTRAOCULAR LENS PLACEMENT (IOC) LEFT DIABETIC (Left) - Diabetic - diet controlled Potassium draw prior to procedure  8:00 ARRIVAL  Patient Location: PACU  Anesthesia Type: MAC  Level of Consciousness: awake, alert  and patient cooperative  Airway and Oxygen Therapy: Patient Spontanous Breathing and Patient connected to supplemental oxygen  Post-op Assessment: Post-op Vital signs reviewed, Patient's Cardiovascular Status Stable, Respiratory Function Stable, Patent Airway and No signs of Nausea or vomiting  Post-op Vital Signs: Reviewed and stable  Complications: No apparent anesthesia complications

## 2017-01-20 NOTE — H&P (Signed)
The History and Physical notes are on paper, have been signed, and are to be scanned.   I have examined the patient and there are no changes to the H&P.   Kelli Nguyen 01/20/2017 10:07 AM

## 2017-01-20 NOTE — Anesthesia Postprocedure Evaluation (Signed)
Anesthesia Post Note  Patient: Kelli Nguyen  Procedure(s) Performed: Procedure(s) (LRB): CATARACT EXTRACTION PHACO AND INTRAOCULAR LENS PLACEMENT (IOC) LEFT DIABETIC (Left)  Patient location during evaluation: PACU Anesthesia Type: MAC Level of consciousness: awake and alert Pain management: pain level controlled Vital Signs Assessment: post-procedure vital signs reviewed and stable Respiratory status: spontaneous breathing, nonlabored ventilation, respiratory function stable and patient connected to nasal cannula oxygen Cardiovascular status: stable and blood pressure returned to baseline Postop Assessment: no apparent nausea or vomiting Anesthetic complications: no    Analeya Luallen

## 2017-01-20 NOTE — Anesthesia Preprocedure Evaluation (Addendum)
Anesthesia Evaluation  Patient identified by MRN, date of birth, ID band Patient awake    Reviewed: Allergy & Precautions, NPO status , Patient's Chart, lab work & pertinent test results, reviewed documented beta blocker date and time   History of Anesthesia Complications Negative for: history of anesthetic complications  Airway Mallampati: II  TM Distance: >3 FB Neck ROM: Full    Dental no notable dental hx. (+) Poor Dentition, Missing, Partial Lower, Partial Upper   Pulmonary neg pulmonary ROS,    Pulmonary exam normal breath sounds clear to auscultation       Cardiovascular Exercise Tolerance: Good hypertension, + CAD and + Cardiac Stents (maybe 2 ?)  Normal cardiovascular exam+ dysrhythmias (pvc with rbbb)  Rhythm:Regular Rate:Normal     Neuro/Psych Anxiety Neuropathy     GI/Hepatic Neg liver ROS, GERD  Medicated,  Endo/Other  diabetes (diet controlled)  Renal/GU ESRF and DialysisRenal disease (last HD on 9/15;)  negative genitourinary   Musculoskeletal negative musculoskeletal ROS (+)   Abdominal (+) + obese,   Peds  Hematology  (+) anemia ,   Anesthesia Other Findings   Reproductive/Obstetrics                            Anesthesia Physical Anesthesia Plan  ASA: III  Anesthesia Plan: MAC   Post-op Pain Management:    Induction:   PONV Risk Score and Plan:   Airway Management Planned:   Additional Equipment:   Intra-op Plan:   Post-operative Plan:   Informed Consent: I have reviewed the patients History and Physical, chart, labs and discussed the procedure including the risks, benefits and alternatives for the proposed anesthesia with the patient or authorized representative who has indicated his/her understanding and acceptance.     Plan Discussed with: CRNA  Anesthesia Plan Comments:         Anesthesia Quick Evaluation

## 2017-01-21 ENCOUNTER — Encounter: Payer: Self-pay | Admitting: Ophthalmology

## 2017-01-21 DIAGNOSIS — Z23 Encounter for immunization: Secondary | ICD-10-CM | POA: Diagnosis not present

## 2017-01-21 DIAGNOSIS — D631 Anemia in chronic kidney disease: Secondary | ICD-10-CM | POA: Diagnosis not present

## 2017-01-21 DIAGNOSIS — N2581 Secondary hyperparathyroidism of renal origin: Secondary | ICD-10-CM | POA: Diagnosis not present

## 2017-01-21 DIAGNOSIS — N186 End stage renal disease: Secondary | ICD-10-CM | POA: Diagnosis not present

## 2017-01-21 DIAGNOSIS — E119 Type 2 diabetes mellitus without complications: Secondary | ICD-10-CM | POA: Diagnosis not present

## 2017-01-23 DIAGNOSIS — N186 End stage renal disease: Secondary | ICD-10-CM | POA: Diagnosis not present

## 2017-01-23 DIAGNOSIS — Z23 Encounter for immunization: Secondary | ICD-10-CM | POA: Diagnosis not present

## 2017-01-23 DIAGNOSIS — N2581 Secondary hyperparathyroidism of renal origin: Secondary | ICD-10-CM | POA: Diagnosis not present

## 2017-01-23 DIAGNOSIS — D631 Anemia in chronic kidney disease: Secondary | ICD-10-CM | POA: Diagnosis not present

## 2017-01-23 DIAGNOSIS — E119 Type 2 diabetes mellitus without complications: Secondary | ICD-10-CM | POA: Diagnosis not present

## 2017-01-25 DIAGNOSIS — N2581 Secondary hyperparathyroidism of renal origin: Secondary | ICD-10-CM | POA: Diagnosis not present

## 2017-01-25 DIAGNOSIS — D631 Anemia in chronic kidney disease: Secondary | ICD-10-CM | POA: Diagnosis not present

## 2017-01-25 DIAGNOSIS — E119 Type 2 diabetes mellitus without complications: Secondary | ICD-10-CM | POA: Diagnosis not present

## 2017-01-25 DIAGNOSIS — N186 End stage renal disease: Secondary | ICD-10-CM | POA: Diagnosis not present

## 2017-01-25 DIAGNOSIS — Z23 Encounter for immunization: Secondary | ICD-10-CM | POA: Diagnosis not present

## 2017-01-28 DIAGNOSIS — N2581 Secondary hyperparathyroidism of renal origin: Secondary | ICD-10-CM | POA: Diagnosis not present

## 2017-01-28 DIAGNOSIS — Z7952 Long term (current) use of systemic steroids: Secondary | ICD-10-CM | POA: Diagnosis not present

## 2017-01-28 DIAGNOSIS — N186 End stage renal disease: Secondary | ICD-10-CM | POA: Diagnosis not present

## 2017-01-28 DIAGNOSIS — M1612 Unilateral primary osteoarthritis, left hip: Secondary | ICD-10-CM | POA: Diagnosis not present

## 2017-01-28 DIAGNOSIS — C649 Malignant neoplasm of unspecified kidney, except renal pelvis: Secondary | ICD-10-CM | POA: Diagnosis not present

## 2017-01-28 DIAGNOSIS — K219 Gastro-esophageal reflux disease without esophagitis: Secondary | ICD-10-CM | POA: Diagnosis not present

## 2017-01-28 DIAGNOSIS — Z951 Presence of aortocoronary bypass graft: Secondary | ICD-10-CM | POA: Diagnosis not present

## 2017-01-28 DIAGNOSIS — D631 Anemia in chronic kidney disease: Secondary | ICD-10-CM | POA: Diagnosis not present

## 2017-01-28 DIAGNOSIS — E119 Type 2 diabetes mellitus without complications: Secondary | ICD-10-CM | POA: Diagnosis not present

## 2017-01-28 DIAGNOSIS — Z79899 Other long term (current) drug therapy: Secondary | ICD-10-CM | POA: Diagnosis not present

## 2017-01-28 DIAGNOSIS — Z7982 Long term (current) use of aspirin: Secondary | ICD-10-CM | POA: Diagnosis not present

## 2017-01-28 DIAGNOSIS — Z23 Encounter for immunization: Secondary | ICD-10-CM | POA: Diagnosis not present

## 2017-01-28 DIAGNOSIS — Z880 Allergy status to penicillin: Secondary | ICD-10-CM | POA: Diagnosis not present

## 2017-01-28 DIAGNOSIS — E1122 Type 2 diabetes mellitus with diabetic chronic kidney disease: Secondary | ICD-10-CM | POA: Diagnosis not present

## 2017-01-28 DIAGNOSIS — M25552 Pain in left hip: Secondary | ICD-10-CM | POA: Diagnosis not present

## 2017-01-28 DIAGNOSIS — Z905 Acquired absence of kidney: Secondary | ICD-10-CM | POA: Diagnosis not present

## 2017-01-28 DIAGNOSIS — M8580 Other specified disorders of bone density and structure, unspecified site: Secondary | ICD-10-CM | POA: Diagnosis not present

## 2017-01-28 DIAGNOSIS — Z992 Dependence on renal dialysis: Secondary | ICD-10-CM | POA: Diagnosis not present

## 2017-01-28 DIAGNOSIS — I2510 Atherosclerotic heart disease of native coronary artery without angina pectoris: Secondary | ICD-10-CM | POA: Diagnosis not present

## 2017-01-28 DIAGNOSIS — I120 Hypertensive chronic kidney disease with stage 5 chronic kidney disease or end stage renal disease: Secondary | ICD-10-CM | POA: Diagnosis not present

## 2017-01-30 DIAGNOSIS — D631 Anemia in chronic kidney disease: Secondary | ICD-10-CM | POA: Diagnosis not present

## 2017-01-30 DIAGNOSIS — N186 End stage renal disease: Secondary | ICD-10-CM | POA: Diagnosis not present

## 2017-01-30 DIAGNOSIS — E119 Type 2 diabetes mellitus without complications: Secondary | ICD-10-CM | POA: Diagnosis not present

## 2017-01-30 DIAGNOSIS — Z23 Encounter for immunization: Secondary | ICD-10-CM | POA: Diagnosis not present

## 2017-01-30 DIAGNOSIS — N2581 Secondary hyperparathyroidism of renal origin: Secondary | ICD-10-CM | POA: Diagnosis not present

## 2017-02-01 DIAGNOSIS — N186 End stage renal disease: Secondary | ICD-10-CM | POA: Diagnosis not present

## 2017-02-01 DIAGNOSIS — D631 Anemia in chronic kidney disease: Secondary | ICD-10-CM | POA: Diagnosis not present

## 2017-02-01 DIAGNOSIS — N2581 Secondary hyperparathyroidism of renal origin: Secondary | ICD-10-CM | POA: Diagnosis not present

## 2017-02-01 DIAGNOSIS — E119 Type 2 diabetes mellitus without complications: Secondary | ICD-10-CM | POA: Diagnosis not present

## 2017-02-01 DIAGNOSIS — Z23 Encounter for immunization: Secondary | ICD-10-CM | POA: Diagnosis not present

## 2017-02-02 DIAGNOSIS — N186 End stage renal disease: Secondary | ICD-10-CM | POA: Diagnosis not present

## 2017-02-02 DIAGNOSIS — E1122 Type 2 diabetes mellitus with diabetic chronic kidney disease: Secondary | ICD-10-CM | POA: Diagnosis not present

## 2017-02-02 DIAGNOSIS — Z992 Dependence on renal dialysis: Secondary | ICD-10-CM | POA: Diagnosis not present

## 2017-02-04 DIAGNOSIS — N2581 Secondary hyperparathyroidism of renal origin: Secondary | ICD-10-CM | POA: Diagnosis not present

## 2017-02-04 DIAGNOSIS — E119 Type 2 diabetes mellitus without complications: Secondary | ICD-10-CM | POA: Diagnosis not present

## 2017-02-04 DIAGNOSIS — D631 Anemia in chronic kidney disease: Secondary | ICD-10-CM | POA: Diagnosis not present

## 2017-02-04 DIAGNOSIS — N186 End stage renal disease: Secondary | ICD-10-CM | POA: Diagnosis not present

## 2017-02-05 ENCOUNTER — Encounter (INDEPENDENT_AMBULATORY_CARE_PROVIDER_SITE_OTHER): Payer: Medicare Other

## 2017-02-05 ENCOUNTER — Ambulatory Visit (INDEPENDENT_AMBULATORY_CARE_PROVIDER_SITE_OTHER): Payer: PRIVATE HEALTH INSURANCE | Admitting: Vascular Surgery

## 2017-02-05 DIAGNOSIS — G8929 Other chronic pain: Secondary | ICD-10-CM | POA: Diagnosis not present

## 2017-02-05 DIAGNOSIS — M25552 Pain in left hip: Secondary | ICD-10-CM | POA: Diagnosis not present

## 2017-02-06 DIAGNOSIS — E119 Type 2 diabetes mellitus without complications: Secondary | ICD-10-CM | POA: Diagnosis not present

## 2017-02-06 DIAGNOSIS — D631 Anemia in chronic kidney disease: Secondary | ICD-10-CM | POA: Diagnosis not present

## 2017-02-06 DIAGNOSIS — N2581 Secondary hyperparathyroidism of renal origin: Secondary | ICD-10-CM | POA: Diagnosis not present

## 2017-02-06 DIAGNOSIS — N186 End stage renal disease: Secondary | ICD-10-CM | POA: Diagnosis not present

## 2017-02-08 DIAGNOSIS — N186 End stage renal disease: Secondary | ICD-10-CM | POA: Diagnosis not present

## 2017-02-08 DIAGNOSIS — N2581 Secondary hyperparathyroidism of renal origin: Secondary | ICD-10-CM | POA: Diagnosis not present

## 2017-02-08 DIAGNOSIS — D631 Anemia in chronic kidney disease: Secondary | ICD-10-CM | POA: Diagnosis not present

## 2017-02-08 DIAGNOSIS — E119 Type 2 diabetes mellitus without complications: Secondary | ICD-10-CM | POA: Diagnosis not present

## 2017-02-10 DIAGNOSIS — M25552 Pain in left hip: Secondary | ICD-10-CM | POA: Diagnosis not present

## 2017-02-10 DIAGNOSIS — Z85528 Personal history of other malignant neoplasm of kidney: Secondary | ICD-10-CM | POA: Diagnosis not present

## 2017-02-10 DIAGNOSIS — G8929 Other chronic pain: Secondary | ICD-10-CM | POA: Diagnosis not present

## 2017-02-11 DIAGNOSIS — N2581 Secondary hyperparathyroidism of renal origin: Secondary | ICD-10-CM | POA: Diagnosis not present

## 2017-02-11 DIAGNOSIS — E119 Type 2 diabetes mellitus without complications: Secondary | ICD-10-CM | POA: Diagnosis not present

## 2017-02-11 DIAGNOSIS — D631 Anemia in chronic kidney disease: Secondary | ICD-10-CM | POA: Diagnosis not present

## 2017-02-11 DIAGNOSIS — N186 End stage renal disease: Secondary | ICD-10-CM | POA: Diagnosis not present

## 2017-02-12 DIAGNOSIS — M25552 Pain in left hip: Secondary | ICD-10-CM | POA: Diagnosis not present

## 2017-02-12 DIAGNOSIS — G8929 Other chronic pain: Secondary | ICD-10-CM | POA: Diagnosis not present

## 2017-02-13 DIAGNOSIS — N186 End stage renal disease: Secondary | ICD-10-CM | POA: Diagnosis not present

## 2017-02-13 DIAGNOSIS — N2581 Secondary hyperparathyroidism of renal origin: Secondary | ICD-10-CM | POA: Diagnosis not present

## 2017-02-13 DIAGNOSIS — E119 Type 2 diabetes mellitus without complications: Secondary | ICD-10-CM | POA: Diagnosis not present

## 2017-02-13 DIAGNOSIS — D631 Anemia in chronic kidney disease: Secondary | ICD-10-CM | POA: Diagnosis not present

## 2017-02-14 DIAGNOSIS — M1612 Unilateral primary osteoarthritis, left hip: Secondary | ICD-10-CM | POA: Diagnosis not present

## 2017-02-15 DIAGNOSIS — D631 Anemia in chronic kidney disease: Secondary | ICD-10-CM | POA: Diagnosis not present

## 2017-02-15 DIAGNOSIS — E119 Type 2 diabetes mellitus without complications: Secondary | ICD-10-CM | POA: Diagnosis not present

## 2017-02-15 DIAGNOSIS — N2581 Secondary hyperparathyroidism of renal origin: Secondary | ICD-10-CM | POA: Diagnosis not present

## 2017-02-15 DIAGNOSIS — N186 End stage renal disease: Secondary | ICD-10-CM | POA: Diagnosis not present

## 2017-02-18 DIAGNOSIS — N186 End stage renal disease: Secondary | ICD-10-CM | POA: Diagnosis not present

## 2017-02-18 DIAGNOSIS — N2581 Secondary hyperparathyroidism of renal origin: Secondary | ICD-10-CM | POA: Diagnosis not present

## 2017-02-18 DIAGNOSIS — D631 Anemia in chronic kidney disease: Secondary | ICD-10-CM | POA: Diagnosis not present

## 2017-02-18 DIAGNOSIS — E119 Type 2 diabetes mellitus without complications: Secondary | ICD-10-CM | POA: Diagnosis not present

## 2017-02-19 DIAGNOSIS — G8929 Other chronic pain: Secondary | ICD-10-CM | POA: Diagnosis not present

## 2017-02-19 DIAGNOSIS — M25552 Pain in left hip: Secondary | ICD-10-CM | POA: Diagnosis not present

## 2017-02-20 DIAGNOSIS — N186 End stage renal disease: Secondary | ICD-10-CM | POA: Diagnosis not present

## 2017-02-20 DIAGNOSIS — N2581 Secondary hyperparathyroidism of renal origin: Secondary | ICD-10-CM | POA: Diagnosis not present

## 2017-02-20 DIAGNOSIS — D631 Anemia in chronic kidney disease: Secondary | ICD-10-CM | POA: Diagnosis not present

## 2017-02-20 DIAGNOSIS — E119 Type 2 diabetes mellitus without complications: Secondary | ICD-10-CM | POA: Diagnosis not present

## 2017-02-22 DIAGNOSIS — N186 End stage renal disease: Secondary | ICD-10-CM | POA: Diagnosis not present

## 2017-02-22 DIAGNOSIS — N2581 Secondary hyperparathyroidism of renal origin: Secondary | ICD-10-CM | POA: Diagnosis not present

## 2017-02-22 DIAGNOSIS — E119 Type 2 diabetes mellitus without complications: Secondary | ICD-10-CM | POA: Diagnosis not present

## 2017-02-22 DIAGNOSIS — D631 Anemia in chronic kidney disease: Secondary | ICD-10-CM | POA: Diagnosis not present

## 2017-02-25 DIAGNOSIS — D631 Anemia in chronic kidney disease: Secondary | ICD-10-CM | POA: Diagnosis not present

## 2017-02-25 DIAGNOSIS — N2581 Secondary hyperparathyroidism of renal origin: Secondary | ICD-10-CM | POA: Diagnosis not present

## 2017-02-25 DIAGNOSIS — N186 End stage renal disease: Secondary | ICD-10-CM | POA: Diagnosis not present

## 2017-02-25 DIAGNOSIS — E119 Type 2 diabetes mellitus without complications: Secondary | ICD-10-CM | POA: Diagnosis not present

## 2017-02-26 DIAGNOSIS — M1612 Unilateral primary osteoarthritis, left hip: Secondary | ICD-10-CM | POA: Diagnosis not present

## 2017-02-26 DIAGNOSIS — G8929 Other chronic pain: Secondary | ICD-10-CM | POA: Diagnosis not present

## 2017-02-26 DIAGNOSIS — M25552 Pain in left hip: Secondary | ICD-10-CM | POA: Diagnosis not present

## 2017-02-27 DIAGNOSIS — N2581 Secondary hyperparathyroidism of renal origin: Secondary | ICD-10-CM | POA: Diagnosis not present

## 2017-02-27 DIAGNOSIS — E119 Type 2 diabetes mellitus without complications: Secondary | ICD-10-CM | POA: Diagnosis not present

## 2017-02-27 DIAGNOSIS — D631 Anemia in chronic kidney disease: Secondary | ICD-10-CM | POA: Diagnosis not present

## 2017-02-27 DIAGNOSIS — N186 End stage renal disease: Secondary | ICD-10-CM | POA: Diagnosis not present

## 2017-03-01 DIAGNOSIS — N186 End stage renal disease: Secondary | ICD-10-CM | POA: Diagnosis not present

## 2017-03-01 DIAGNOSIS — E119 Type 2 diabetes mellitus without complications: Secondary | ICD-10-CM | POA: Diagnosis not present

## 2017-03-01 DIAGNOSIS — N2581 Secondary hyperparathyroidism of renal origin: Secondary | ICD-10-CM | POA: Diagnosis not present

## 2017-03-01 DIAGNOSIS — D631 Anemia in chronic kidney disease: Secondary | ICD-10-CM | POA: Diagnosis not present

## 2017-03-04 DIAGNOSIS — N186 End stage renal disease: Secondary | ICD-10-CM | POA: Diagnosis not present

## 2017-03-04 DIAGNOSIS — D631 Anemia in chronic kidney disease: Secondary | ICD-10-CM | POA: Diagnosis not present

## 2017-03-04 DIAGNOSIS — E119 Type 2 diabetes mellitus without complications: Secondary | ICD-10-CM | POA: Diagnosis not present

## 2017-03-04 DIAGNOSIS — N2581 Secondary hyperparathyroidism of renal origin: Secondary | ICD-10-CM | POA: Diagnosis not present

## 2017-03-05 DIAGNOSIS — N186 End stage renal disease: Secondary | ICD-10-CM | POA: Diagnosis not present

## 2017-03-05 DIAGNOSIS — E1122 Type 2 diabetes mellitus with diabetic chronic kidney disease: Secondary | ICD-10-CM | POA: Diagnosis not present

## 2017-03-05 DIAGNOSIS — M1612 Unilateral primary osteoarthritis, left hip: Secondary | ICD-10-CM | POA: Diagnosis not present

## 2017-03-05 DIAGNOSIS — Z992 Dependence on renal dialysis: Secondary | ICD-10-CM | POA: Diagnosis not present

## 2017-03-06 DIAGNOSIS — E119 Type 2 diabetes mellitus without complications: Secondary | ICD-10-CM | POA: Diagnosis not present

## 2017-03-06 DIAGNOSIS — N2581 Secondary hyperparathyroidism of renal origin: Secondary | ICD-10-CM | POA: Diagnosis not present

## 2017-03-06 DIAGNOSIS — D631 Anemia in chronic kidney disease: Secondary | ICD-10-CM | POA: Diagnosis not present

## 2017-03-06 DIAGNOSIS — N186 End stage renal disease: Secondary | ICD-10-CM | POA: Diagnosis not present

## 2017-03-08 DIAGNOSIS — D631 Anemia in chronic kidney disease: Secondary | ICD-10-CM | POA: Diagnosis not present

## 2017-03-08 DIAGNOSIS — E119 Type 2 diabetes mellitus without complications: Secondary | ICD-10-CM | POA: Diagnosis not present

## 2017-03-08 DIAGNOSIS — N186 End stage renal disease: Secondary | ICD-10-CM | POA: Diagnosis not present

## 2017-03-08 DIAGNOSIS — N2581 Secondary hyperparathyroidism of renal origin: Secondary | ICD-10-CM | POA: Diagnosis not present

## 2017-03-09 DIAGNOSIS — S72042A Displaced fracture of base of neck of left femur, initial encounter for closed fracture: Secondary | ICD-10-CM | POA: Diagnosis not present

## 2017-03-09 DIAGNOSIS — K219 Gastro-esophageal reflux disease without esophagitis: Secondary | ICD-10-CM | POA: Diagnosis present

## 2017-03-09 DIAGNOSIS — Z880 Allergy status to penicillin: Secondary | ICD-10-CM | POA: Diagnosis not present

## 2017-03-09 DIAGNOSIS — M160 Bilateral primary osteoarthritis of hip: Secondary | ICD-10-CM | POA: Diagnosis present

## 2017-03-09 DIAGNOSIS — R9431 Abnormal electrocardiogram [ECG] [EKG]: Secondary | ICD-10-CM | POA: Diagnosis not present

## 2017-03-09 DIAGNOSIS — Z720 Tobacco use: Secondary | ICD-10-CM | POA: Diagnosis not present

## 2017-03-09 DIAGNOSIS — S72002D Fracture of unspecified part of neck of left femur, subsequent encounter for closed fracture with routine healing: Secondary | ICD-10-CM | POA: Diagnosis not present

## 2017-03-09 DIAGNOSIS — S72002A Fracture of unspecified part of neck of left femur, initial encounter for closed fracture: Secondary | ICD-10-CM

## 2017-03-09 DIAGNOSIS — R52 Pain, unspecified: Secondary | ICD-10-CM | POA: Diagnosis not present

## 2017-03-09 DIAGNOSIS — S79919A Unspecified injury of unspecified hip, initial encounter: Secondary | ICD-10-CM | POA: Diagnosis not present

## 2017-03-09 DIAGNOSIS — Z8669 Personal history of other diseases of the nervous system and sense organs: Secondary | ICD-10-CM | POA: Diagnosis not present

## 2017-03-09 DIAGNOSIS — I499 Cardiac arrhythmia, unspecified: Secondary | ICD-10-CM | POA: Diagnosis not present

## 2017-03-09 DIAGNOSIS — I10 Essential (primary) hypertension: Secondary | ICD-10-CM | POA: Diagnosis not present

## 2017-03-09 DIAGNOSIS — R279 Unspecified lack of coordination: Secondary | ICD-10-CM | POA: Diagnosis not present

## 2017-03-09 DIAGNOSIS — S72092A Other fracture of head and neck of left femur, initial encounter for closed fracture: Secondary | ICD-10-CM | POA: Diagnosis not present

## 2017-03-09 DIAGNOSIS — G5600 Carpal tunnel syndrome, unspecified upper limb: Secondary | ICD-10-CM | POA: Diagnosis present

## 2017-03-09 DIAGNOSIS — D631 Anemia in chronic kidney disease: Secondary | ICD-10-CM | POA: Diagnosis not present

## 2017-03-09 DIAGNOSIS — W19XXXA Unspecified fall, initial encounter: Secondary | ICD-10-CM | POA: Diagnosis not present

## 2017-03-09 DIAGNOSIS — M25552 Pain in left hip: Secondary | ICD-10-CM | POA: Diagnosis not present

## 2017-03-09 DIAGNOSIS — R5381 Other malaise: Secondary | ICD-10-CM | POA: Diagnosis not present

## 2017-03-09 DIAGNOSIS — Z955 Presence of coronary angioplasty implant and graft: Secondary | ICD-10-CM | POA: Diagnosis not present

## 2017-03-09 DIAGNOSIS — Z96642 Presence of left artificial hip joint: Secondary | ICD-10-CM | POA: Diagnosis not present

## 2017-03-09 DIAGNOSIS — Z905 Acquired absence of kidney: Secondary | ICD-10-CM | POA: Diagnosis not present

## 2017-03-09 DIAGNOSIS — H4010X0 Unspecified open-angle glaucoma, stage unspecified: Secondary | ICD-10-CM | POA: Diagnosis not present

## 2017-03-09 DIAGNOSIS — I2510 Atherosclerotic heart disease of native coronary artery without angina pectoris: Secondary | ICD-10-CM | POA: Diagnosis not present

## 2017-03-09 DIAGNOSIS — Z951 Presence of aortocoronary bypass graft: Secondary | ICD-10-CM | POA: Diagnosis not present

## 2017-03-09 DIAGNOSIS — I120 Hypertensive chronic kidney disease with stage 5 chronic kidney disease or end stage renal disease: Secondary | ICD-10-CM | POA: Diagnosis not present

## 2017-03-09 DIAGNOSIS — W06XXXA Fall from bed, initial encounter: Secondary | ICD-10-CM | POA: Diagnosis not present

## 2017-03-09 DIAGNOSIS — I4510 Unspecified right bundle-branch block: Secondary | ICD-10-CM | POA: Diagnosis not present

## 2017-03-09 DIAGNOSIS — Z7982 Long term (current) use of aspirin: Secondary | ICD-10-CM | POA: Diagnosis not present

## 2017-03-09 DIAGNOSIS — Z5189 Encounter for other specified aftercare: Secondary | ICD-10-CM | POA: Diagnosis not present

## 2017-03-09 DIAGNOSIS — R768 Other specified abnormal immunological findings in serum: Secondary | ICD-10-CM | POA: Diagnosis not present

## 2017-03-09 DIAGNOSIS — N186 End stage renal disease: Secondary | ICD-10-CM | POA: Diagnosis not present

## 2017-03-09 DIAGNOSIS — N250 Renal osteodystrophy: Secondary | ICD-10-CM | POA: Diagnosis not present

## 2017-03-09 DIAGNOSIS — Z471 Aftercare following joint replacement surgery: Secondary | ICD-10-CM | POA: Diagnosis not present

## 2017-03-09 DIAGNOSIS — E785 Hyperlipidemia, unspecified: Secondary | ICD-10-CM | POA: Diagnosis not present

## 2017-03-09 DIAGNOSIS — Z992 Dependence on renal dialysis: Secondary | ICD-10-CM | POA: Diagnosis not present

## 2017-03-09 DIAGNOSIS — M899 Disorder of bone, unspecified: Secondary | ICD-10-CM | POA: Diagnosis present

## 2017-03-09 DIAGNOSIS — E559 Vitamin D deficiency, unspecified: Secondary | ICD-10-CM | POA: Diagnosis not present

## 2017-03-09 DIAGNOSIS — M6281 Muscle weakness (generalized): Secondary | ICD-10-CM | POA: Diagnosis not present

## 2017-03-09 DIAGNOSIS — R2689 Other abnormalities of gait and mobility: Secondary | ICD-10-CM | POA: Diagnosis not present

## 2017-03-09 DIAGNOSIS — R718 Other abnormality of red blood cells: Secondary | ICD-10-CM | POA: Diagnosis not present

## 2017-03-09 HISTORY — DX: Fracture of unspecified part of neck of left femur, initial encounter for closed fracture: S72.002A

## 2017-03-15 DIAGNOSIS — E559 Vitamin D deficiency, unspecified: Secondary | ICD-10-CM | POA: Diagnosis not present

## 2017-03-15 DIAGNOSIS — H4010X0 Unspecified open-angle glaucoma, stage unspecified: Secondary | ICD-10-CM | POA: Diagnosis not present

## 2017-03-15 DIAGNOSIS — Z992 Dependence on renal dialysis: Secondary | ICD-10-CM | POA: Diagnosis not present

## 2017-03-15 DIAGNOSIS — R52 Pain, unspecified: Secondary | ICD-10-CM | POA: Diagnosis not present

## 2017-03-15 DIAGNOSIS — N186 End stage renal disease: Secondary | ICD-10-CM | POA: Diagnosis not present

## 2017-03-15 DIAGNOSIS — E119 Type 2 diabetes mellitus without complications: Secondary | ICD-10-CM | POA: Diagnosis not present

## 2017-03-15 DIAGNOSIS — Z7901 Long term (current) use of anticoagulants: Secondary | ICD-10-CM | POA: Diagnosis not present

## 2017-03-15 DIAGNOSIS — R279 Unspecified lack of coordination: Secondary | ICD-10-CM | POA: Diagnosis not present

## 2017-03-15 DIAGNOSIS — Z96642 Presence of left artificial hip joint: Secondary | ICD-10-CM | POA: Diagnosis not present

## 2017-03-15 DIAGNOSIS — D649 Anemia, unspecified: Secondary | ICD-10-CM | POA: Diagnosis not present

## 2017-03-15 DIAGNOSIS — D631 Anemia in chronic kidney disease: Secondary | ICD-10-CM | POA: Diagnosis not present

## 2017-03-15 DIAGNOSIS — S72002D Fracture of unspecified part of neck of left femur, subsequent encounter for closed fracture with routine healing: Secondary | ICD-10-CM | POA: Diagnosis not present

## 2017-03-15 DIAGNOSIS — Z7982 Long term (current) use of aspirin: Secondary | ICD-10-CM | POA: Diagnosis not present

## 2017-03-15 DIAGNOSIS — H409 Unspecified glaucoma: Secondary | ICD-10-CM | POA: Diagnosis not present

## 2017-03-15 DIAGNOSIS — I10 Essential (primary) hypertension: Secondary | ICD-10-CM | POA: Diagnosis not present

## 2017-03-15 DIAGNOSIS — E1122 Type 2 diabetes mellitus with diabetic chronic kidney disease: Secondary | ICD-10-CM | POA: Diagnosis not present

## 2017-03-15 DIAGNOSIS — M6281 Muscle weakness (generalized): Secondary | ICD-10-CM | POA: Diagnosis not present

## 2017-03-15 DIAGNOSIS — N2581 Secondary hyperparathyroidism of renal origin: Secondary | ICD-10-CM | POA: Diagnosis not present

## 2017-03-15 DIAGNOSIS — I2510 Atherosclerotic heart disease of native coronary artery without angina pectoris: Secondary | ICD-10-CM | POA: Diagnosis not present

## 2017-03-15 DIAGNOSIS — E785 Hyperlipidemia, unspecified: Secondary | ICD-10-CM | POA: Diagnosis not present

## 2017-03-15 DIAGNOSIS — S7292XD Unspecified fracture of left femur, subsequent encounter for closed fracture with routine healing: Secondary | ICD-10-CM | POA: Diagnosis not present

## 2017-03-15 DIAGNOSIS — Z5189 Encounter for other specified aftercare: Secondary | ICD-10-CM | POA: Diagnosis not present

## 2017-03-15 DIAGNOSIS — S72002A Fracture of unspecified part of neck of left femur, initial encounter for closed fracture: Secondary | ICD-10-CM | POA: Diagnosis not present

## 2017-03-15 DIAGNOSIS — S79912A Unspecified injury of left hip, initial encounter: Secondary | ICD-10-CM | POA: Diagnosis not present

## 2017-03-15 DIAGNOSIS — R2689 Other abnormalities of gait and mobility: Secondary | ICD-10-CM | POA: Diagnosis not present

## 2017-03-15 DIAGNOSIS — R5381 Other malaise: Secondary | ICD-10-CM | POA: Diagnosis not present

## 2017-03-18 DIAGNOSIS — E785 Hyperlipidemia, unspecified: Secondary | ICD-10-CM | POA: Diagnosis not present

## 2017-03-18 DIAGNOSIS — Z7901 Long term (current) use of anticoagulants: Secondary | ICD-10-CM | POA: Diagnosis not present

## 2017-03-18 DIAGNOSIS — I10 Essential (primary) hypertension: Secondary | ICD-10-CM | POA: Diagnosis not present

## 2017-03-18 DIAGNOSIS — H409 Unspecified glaucoma: Secondary | ICD-10-CM | POA: Diagnosis not present

## 2017-03-18 DIAGNOSIS — N186 End stage renal disease: Secondary | ICD-10-CM | POA: Diagnosis not present

## 2017-03-18 DIAGNOSIS — N2581 Secondary hyperparathyroidism of renal origin: Secondary | ICD-10-CM | POA: Diagnosis not present

## 2017-03-18 DIAGNOSIS — Z992 Dependence on renal dialysis: Secondary | ICD-10-CM | POA: Diagnosis not present

## 2017-03-18 DIAGNOSIS — E119 Type 2 diabetes mellitus without complications: Secondary | ICD-10-CM | POA: Diagnosis not present

## 2017-03-18 DIAGNOSIS — I2510 Atherosclerotic heart disease of native coronary artery without angina pectoris: Secondary | ICD-10-CM | POA: Diagnosis not present

## 2017-03-18 DIAGNOSIS — D631 Anemia in chronic kidney disease: Secondary | ICD-10-CM | POA: Diagnosis not present

## 2017-03-18 DIAGNOSIS — S7292XD Unspecified fracture of left femur, subsequent encounter for closed fracture with routine healing: Secondary | ICD-10-CM | POA: Diagnosis not present

## 2017-03-19 DIAGNOSIS — Z96642 Presence of left artificial hip joint: Secondary | ICD-10-CM | POA: Diagnosis not present

## 2017-03-19 DIAGNOSIS — S79912A Unspecified injury of left hip, initial encounter: Secondary | ICD-10-CM | POA: Diagnosis not present

## 2017-03-20 DIAGNOSIS — E119 Type 2 diabetes mellitus without complications: Secondary | ICD-10-CM | POA: Diagnosis not present

## 2017-03-20 DIAGNOSIS — N2581 Secondary hyperparathyroidism of renal origin: Secondary | ICD-10-CM | POA: Diagnosis not present

## 2017-03-20 DIAGNOSIS — D631 Anemia in chronic kidney disease: Secondary | ICD-10-CM | POA: Diagnosis not present

## 2017-03-20 DIAGNOSIS — N186 End stage renal disease: Secondary | ICD-10-CM | POA: Diagnosis not present

## 2017-03-22 DIAGNOSIS — N2581 Secondary hyperparathyroidism of renal origin: Secondary | ICD-10-CM | POA: Diagnosis not present

## 2017-03-22 DIAGNOSIS — N186 End stage renal disease: Secondary | ICD-10-CM | POA: Diagnosis not present

## 2017-03-22 DIAGNOSIS — E119 Type 2 diabetes mellitus without complications: Secondary | ICD-10-CM | POA: Diagnosis not present

## 2017-03-22 DIAGNOSIS — D631 Anemia in chronic kidney disease: Secondary | ICD-10-CM | POA: Diagnosis not present

## 2017-03-24 DIAGNOSIS — N2581 Secondary hyperparathyroidism of renal origin: Secondary | ICD-10-CM | POA: Diagnosis not present

## 2017-03-24 DIAGNOSIS — N186 End stage renal disease: Secondary | ICD-10-CM | POA: Diagnosis not present

## 2017-03-24 DIAGNOSIS — E119 Type 2 diabetes mellitus without complications: Secondary | ICD-10-CM | POA: Diagnosis not present

## 2017-03-24 DIAGNOSIS — D631 Anemia in chronic kidney disease: Secondary | ICD-10-CM | POA: Diagnosis not present

## 2017-03-25 DIAGNOSIS — I10 Essential (primary) hypertension: Secondary | ICD-10-CM | POA: Diagnosis not present

## 2017-03-25 DIAGNOSIS — Z7982 Long term (current) use of aspirin: Secondary | ICD-10-CM | POA: Diagnosis not present

## 2017-03-25 DIAGNOSIS — Z992 Dependence on renal dialysis: Secondary | ICD-10-CM | POA: Diagnosis not present

## 2017-03-25 DIAGNOSIS — H409 Unspecified glaucoma: Secondary | ICD-10-CM | POA: Diagnosis not present

## 2017-03-25 DIAGNOSIS — I2510 Atherosclerotic heart disease of native coronary artery without angina pectoris: Secondary | ICD-10-CM | POA: Diagnosis not present

## 2017-03-25 DIAGNOSIS — S7292XD Unspecified fracture of left femur, subsequent encounter for closed fracture with routine healing: Secondary | ICD-10-CM | POA: Diagnosis not present

## 2017-03-25 DIAGNOSIS — D649 Anemia, unspecified: Secondary | ICD-10-CM | POA: Diagnosis not present

## 2017-03-25 DIAGNOSIS — Z7901 Long term (current) use of anticoagulants: Secondary | ICD-10-CM | POA: Diagnosis not present

## 2017-03-25 DIAGNOSIS — N186 End stage renal disease: Secondary | ICD-10-CM | POA: Diagnosis not present

## 2017-03-26 DIAGNOSIS — N186 End stage renal disease: Secondary | ICD-10-CM | POA: Diagnosis not present

## 2017-03-26 DIAGNOSIS — E119 Type 2 diabetes mellitus without complications: Secondary | ICD-10-CM | POA: Diagnosis not present

## 2017-03-26 DIAGNOSIS — N2581 Secondary hyperparathyroidism of renal origin: Secondary | ICD-10-CM | POA: Diagnosis not present

## 2017-03-26 DIAGNOSIS — D631 Anemia in chronic kidney disease: Secondary | ICD-10-CM | POA: Diagnosis not present

## 2017-03-29 DIAGNOSIS — N2581 Secondary hyperparathyroidism of renal origin: Secondary | ICD-10-CM | POA: Diagnosis not present

## 2017-03-29 DIAGNOSIS — D631 Anemia in chronic kidney disease: Secondary | ICD-10-CM | POA: Diagnosis not present

## 2017-03-29 DIAGNOSIS — E119 Type 2 diabetes mellitus without complications: Secondary | ICD-10-CM | POA: Diagnosis not present

## 2017-03-29 DIAGNOSIS — N186 End stage renal disease: Secondary | ICD-10-CM | POA: Diagnosis not present

## 2017-04-01 DIAGNOSIS — E785 Hyperlipidemia, unspecified: Secondary | ICD-10-CM | POA: Diagnosis not present

## 2017-04-01 DIAGNOSIS — N186 End stage renal disease: Secondary | ICD-10-CM | POA: Diagnosis not present

## 2017-04-01 DIAGNOSIS — I10 Essential (primary) hypertension: Secondary | ICD-10-CM | POA: Diagnosis not present

## 2017-04-01 DIAGNOSIS — I2510 Atherosclerotic heart disease of native coronary artery without angina pectoris: Secondary | ICD-10-CM | POA: Diagnosis not present

## 2017-04-01 DIAGNOSIS — D631 Anemia in chronic kidney disease: Secondary | ICD-10-CM | POA: Diagnosis not present

## 2017-04-01 DIAGNOSIS — E119 Type 2 diabetes mellitus without complications: Secondary | ICD-10-CM | POA: Diagnosis not present

## 2017-04-01 DIAGNOSIS — N2581 Secondary hyperparathyroidism of renal origin: Secondary | ICD-10-CM | POA: Diagnosis not present

## 2017-04-01 DIAGNOSIS — Z992 Dependence on renal dialysis: Secondary | ICD-10-CM | POA: Diagnosis not present

## 2017-04-01 DIAGNOSIS — S7292XD Unspecified fracture of left femur, subsequent encounter for closed fracture with routine healing: Secondary | ICD-10-CM | POA: Diagnosis not present

## 2017-04-01 DIAGNOSIS — H409 Unspecified glaucoma: Secondary | ICD-10-CM | POA: Diagnosis not present

## 2017-04-01 DIAGNOSIS — Z7982 Long term (current) use of aspirin: Secondary | ICD-10-CM | POA: Diagnosis not present

## 2017-04-03 DIAGNOSIS — E119 Type 2 diabetes mellitus without complications: Secondary | ICD-10-CM | POA: Diagnosis not present

## 2017-04-03 DIAGNOSIS — D631 Anemia in chronic kidney disease: Secondary | ICD-10-CM | POA: Diagnosis not present

## 2017-04-03 DIAGNOSIS — N2581 Secondary hyperparathyroidism of renal origin: Secondary | ICD-10-CM | POA: Diagnosis not present

## 2017-04-03 DIAGNOSIS — N186 End stage renal disease: Secondary | ICD-10-CM | POA: Diagnosis not present

## 2017-04-04 DIAGNOSIS — N186 End stage renal disease: Secondary | ICD-10-CM | POA: Diagnosis not present

## 2017-04-04 DIAGNOSIS — E1122 Type 2 diabetes mellitus with diabetic chronic kidney disease: Secondary | ICD-10-CM | POA: Diagnosis not present

## 2017-04-04 DIAGNOSIS — Z992 Dependence on renal dialysis: Secondary | ICD-10-CM | POA: Diagnosis not present

## 2017-04-05 DIAGNOSIS — N2581 Secondary hyperparathyroidism of renal origin: Secondary | ICD-10-CM | POA: Diagnosis not present

## 2017-04-05 DIAGNOSIS — N186 End stage renal disease: Secondary | ICD-10-CM | POA: Diagnosis not present

## 2017-04-05 DIAGNOSIS — E119 Type 2 diabetes mellitus without complications: Secondary | ICD-10-CM | POA: Diagnosis not present

## 2017-04-05 DIAGNOSIS — D631 Anemia in chronic kidney disease: Secondary | ICD-10-CM | POA: Diagnosis not present

## 2017-04-06 DIAGNOSIS — N186 End stage renal disease: Secondary | ICD-10-CM | POA: Diagnosis not present

## 2017-04-06 DIAGNOSIS — I1311 Hypertensive heart and chronic kidney disease without heart failure, with stage 5 chronic kidney disease, or end stage renal disease: Secondary | ICD-10-CM | POA: Diagnosis not present

## 2017-04-06 DIAGNOSIS — S72041D Displaced fracture of base of neck of right femur, subsequent encounter for closed fracture with routine healing: Secondary | ICD-10-CM | POA: Diagnosis not present

## 2017-04-06 DIAGNOSIS — E1122 Type 2 diabetes mellitus with diabetic chronic kidney disease: Secondary | ICD-10-CM | POA: Diagnosis not present

## 2017-04-06 DIAGNOSIS — L89312 Pressure ulcer of right buttock, stage 2: Secondary | ICD-10-CM | POA: Diagnosis not present

## 2017-04-08 DIAGNOSIS — E119 Type 2 diabetes mellitus without complications: Secondary | ICD-10-CM | POA: Diagnosis not present

## 2017-04-08 DIAGNOSIS — N186 End stage renal disease: Secondary | ICD-10-CM | POA: Diagnosis not present

## 2017-04-08 DIAGNOSIS — D631 Anemia in chronic kidney disease: Secondary | ICD-10-CM | POA: Diagnosis not present

## 2017-04-08 DIAGNOSIS — L89312 Pressure ulcer of right buttock, stage 2: Secondary | ICD-10-CM | POA: Diagnosis not present

## 2017-04-08 DIAGNOSIS — N2581 Secondary hyperparathyroidism of renal origin: Secondary | ICD-10-CM | POA: Diagnosis not present

## 2017-04-08 DIAGNOSIS — S72041D Displaced fracture of base of neck of right femur, subsequent encounter for closed fracture with routine healing: Secondary | ICD-10-CM | POA: Diagnosis not present

## 2017-04-09 DIAGNOSIS — L89312 Pressure ulcer of right buttock, stage 2: Secondary | ICD-10-CM | POA: Diagnosis not present

## 2017-04-09 DIAGNOSIS — S72041D Displaced fracture of base of neck of right femur, subsequent encounter for closed fracture with routine healing: Secondary | ICD-10-CM | POA: Diagnosis not present

## 2017-04-10 DIAGNOSIS — N2581 Secondary hyperparathyroidism of renal origin: Secondary | ICD-10-CM | POA: Diagnosis not present

## 2017-04-10 DIAGNOSIS — N186 End stage renal disease: Secondary | ICD-10-CM | POA: Diagnosis not present

## 2017-04-10 DIAGNOSIS — D631 Anemia in chronic kidney disease: Secondary | ICD-10-CM | POA: Diagnosis not present

## 2017-04-10 DIAGNOSIS — E119 Type 2 diabetes mellitus without complications: Secondary | ICD-10-CM | POA: Diagnosis not present

## 2017-04-12 DIAGNOSIS — D631 Anemia in chronic kidney disease: Secondary | ICD-10-CM | POA: Diagnosis not present

## 2017-04-12 DIAGNOSIS — N2581 Secondary hyperparathyroidism of renal origin: Secondary | ICD-10-CM | POA: Diagnosis not present

## 2017-04-12 DIAGNOSIS — E119 Type 2 diabetes mellitus without complications: Secondary | ICD-10-CM | POA: Diagnosis not present

## 2017-04-12 DIAGNOSIS — N186 End stage renal disease: Secondary | ICD-10-CM | POA: Diagnosis not present

## 2017-04-15 DIAGNOSIS — D631 Anemia in chronic kidney disease: Secondary | ICD-10-CM | POA: Diagnosis not present

## 2017-04-15 DIAGNOSIS — N186 End stage renal disease: Secondary | ICD-10-CM | POA: Diagnosis not present

## 2017-04-15 DIAGNOSIS — E119 Type 2 diabetes mellitus without complications: Secondary | ICD-10-CM | POA: Diagnosis not present

## 2017-04-15 DIAGNOSIS — N2581 Secondary hyperparathyroidism of renal origin: Secondary | ICD-10-CM | POA: Diagnosis not present

## 2017-04-16 DIAGNOSIS — S72041D Displaced fracture of base of neck of right femur, subsequent encounter for closed fracture with routine healing: Secondary | ICD-10-CM | POA: Diagnosis not present

## 2017-04-16 DIAGNOSIS — L89312 Pressure ulcer of right buttock, stage 2: Secondary | ICD-10-CM | POA: Diagnosis not present

## 2017-04-17 DIAGNOSIS — E119 Type 2 diabetes mellitus without complications: Secondary | ICD-10-CM | POA: Diagnosis not present

## 2017-04-17 DIAGNOSIS — N2581 Secondary hyperparathyroidism of renal origin: Secondary | ICD-10-CM | POA: Diagnosis not present

## 2017-04-17 DIAGNOSIS — D631 Anemia in chronic kidney disease: Secondary | ICD-10-CM | POA: Diagnosis not present

## 2017-04-17 DIAGNOSIS — L89312 Pressure ulcer of right buttock, stage 2: Secondary | ICD-10-CM | POA: Diagnosis not present

## 2017-04-17 DIAGNOSIS — S72041D Displaced fracture of base of neck of right femur, subsequent encounter for closed fracture with routine healing: Secondary | ICD-10-CM | POA: Diagnosis not present

## 2017-04-17 DIAGNOSIS — N186 End stage renal disease: Secondary | ICD-10-CM | POA: Diagnosis not present

## 2017-04-18 DIAGNOSIS — N186 End stage renal disease: Secondary | ICD-10-CM | POA: Diagnosis not present

## 2017-04-18 DIAGNOSIS — L89159 Pressure ulcer of sacral region, unspecified stage: Secondary | ICD-10-CM | POA: Diagnosis not present

## 2017-04-18 DIAGNOSIS — G894 Chronic pain syndrome: Secondary | ICD-10-CM | POA: Diagnosis not present

## 2017-04-18 DIAGNOSIS — S72041D Displaced fracture of base of neck of right femur, subsequent encounter for closed fracture with routine healing: Secondary | ICD-10-CM | POA: Diagnosis not present

## 2017-04-18 DIAGNOSIS — L89312 Pressure ulcer of right buttock, stage 2: Secondary | ICD-10-CM | POA: Diagnosis not present

## 2017-04-18 DIAGNOSIS — Z0000 Encounter for general adult medical examination without abnormal findings: Secondary | ICD-10-CM | POA: Diagnosis not present

## 2017-04-18 DIAGNOSIS — I10 Essential (primary) hypertension: Secondary | ICD-10-CM | POA: Diagnosis not present

## 2017-04-18 DIAGNOSIS — M25552 Pain in left hip: Secondary | ICD-10-CM | POA: Diagnosis not present

## 2017-04-19 DIAGNOSIS — N186 End stage renal disease: Secondary | ICD-10-CM | POA: Diagnosis not present

## 2017-04-19 DIAGNOSIS — E119 Type 2 diabetes mellitus without complications: Secondary | ICD-10-CM | POA: Diagnosis not present

## 2017-04-19 DIAGNOSIS — D631 Anemia in chronic kidney disease: Secondary | ICD-10-CM | POA: Diagnosis not present

## 2017-04-19 DIAGNOSIS — N2581 Secondary hyperparathyroidism of renal origin: Secondary | ICD-10-CM | POA: Diagnosis not present

## 2017-04-21 DIAGNOSIS — H40033 Anatomical narrow angle, bilateral: Secondary | ICD-10-CM | POA: Diagnosis not present

## 2017-04-21 DIAGNOSIS — L89312 Pressure ulcer of right buttock, stage 2: Secondary | ICD-10-CM | POA: Diagnosis not present

## 2017-04-21 DIAGNOSIS — S72041D Displaced fracture of base of neck of right femur, subsequent encounter for closed fracture with routine healing: Secondary | ICD-10-CM | POA: Diagnosis not present

## 2017-04-22 DIAGNOSIS — D631 Anemia in chronic kidney disease: Secondary | ICD-10-CM | POA: Diagnosis not present

## 2017-04-22 DIAGNOSIS — N186 End stage renal disease: Secondary | ICD-10-CM | POA: Diagnosis not present

## 2017-04-22 DIAGNOSIS — N2581 Secondary hyperparathyroidism of renal origin: Secondary | ICD-10-CM | POA: Diagnosis not present

## 2017-04-22 DIAGNOSIS — E119 Type 2 diabetes mellitus without complications: Secondary | ICD-10-CM | POA: Diagnosis not present

## 2017-04-22 DIAGNOSIS — Z96642 Presence of left artificial hip joint: Secondary | ICD-10-CM | POA: Diagnosis not present

## 2017-04-22 DIAGNOSIS — M1611 Unilateral primary osteoarthritis, right hip: Secondary | ICD-10-CM | POA: Diagnosis not present

## 2017-04-23 DIAGNOSIS — L89312 Pressure ulcer of right buttock, stage 2: Secondary | ICD-10-CM | POA: Diagnosis not present

## 2017-04-23 DIAGNOSIS — S72041D Displaced fracture of base of neck of right femur, subsequent encounter for closed fracture with routine healing: Secondary | ICD-10-CM | POA: Diagnosis not present

## 2017-04-24 DIAGNOSIS — D631 Anemia in chronic kidney disease: Secondary | ICD-10-CM | POA: Diagnosis not present

## 2017-04-24 DIAGNOSIS — L89312 Pressure ulcer of right buttock, stage 2: Secondary | ICD-10-CM | POA: Diagnosis not present

## 2017-04-24 DIAGNOSIS — S72041D Displaced fracture of base of neck of right femur, subsequent encounter for closed fracture with routine healing: Secondary | ICD-10-CM | POA: Diagnosis not present

## 2017-04-24 DIAGNOSIS — E119 Type 2 diabetes mellitus without complications: Secondary | ICD-10-CM | POA: Diagnosis not present

## 2017-04-24 DIAGNOSIS — N186 End stage renal disease: Secondary | ICD-10-CM | POA: Diagnosis not present

## 2017-04-24 DIAGNOSIS — N2581 Secondary hyperparathyroidism of renal origin: Secondary | ICD-10-CM | POA: Diagnosis not present

## 2017-04-25 DIAGNOSIS — L89312 Pressure ulcer of right buttock, stage 2: Secondary | ICD-10-CM | POA: Diagnosis not present

## 2017-04-25 DIAGNOSIS — S72041D Displaced fracture of base of neck of right femur, subsequent encounter for closed fracture with routine healing: Secondary | ICD-10-CM | POA: Diagnosis not present

## 2017-04-26 DIAGNOSIS — D631 Anemia in chronic kidney disease: Secondary | ICD-10-CM | POA: Diagnosis not present

## 2017-04-26 DIAGNOSIS — N2581 Secondary hyperparathyroidism of renal origin: Secondary | ICD-10-CM | POA: Diagnosis not present

## 2017-04-26 DIAGNOSIS — N186 End stage renal disease: Secondary | ICD-10-CM | POA: Diagnosis not present

## 2017-04-26 DIAGNOSIS — E119 Type 2 diabetes mellitus without complications: Secondary | ICD-10-CM | POA: Diagnosis not present

## 2017-04-28 DIAGNOSIS — N186 End stage renal disease: Secondary | ICD-10-CM | POA: Diagnosis not present

## 2017-04-28 DIAGNOSIS — N2581 Secondary hyperparathyroidism of renal origin: Secondary | ICD-10-CM | POA: Diagnosis not present

## 2017-04-28 DIAGNOSIS — E119 Type 2 diabetes mellitus without complications: Secondary | ICD-10-CM | POA: Diagnosis not present

## 2017-04-28 DIAGNOSIS — D631 Anemia in chronic kidney disease: Secondary | ICD-10-CM | POA: Diagnosis not present

## 2017-04-30 DIAGNOSIS — S72041D Displaced fracture of base of neck of right femur, subsequent encounter for closed fracture with routine healing: Secondary | ICD-10-CM | POA: Diagnosis not present

## 2017-04-30 DIAGNOSIS — L89312 Pressure ulcer of right buttock, stage 2: Secondary | ICD-10-CM | POA: Diagnosis not present

## 2017-05-01 DIAGNOSIS — E119 Type 2 diabetes mellitus without complications: Secondary | ICD-10-CM | POA: Diagnosis not present

## 2017-05-01 DIAGNOSIS — D631 Anemia in chronic kidney disease: Secondary | ICD-10-CM | POA: Diagnosis not present

## 2017-05-01 DIAGNOSIS — N2581 Secondary hyperparathyroidism of renal origin: Secondary | ICD-10-CM | POA: Diagnosis not present

## 2017-05-01 DIAGNOSIS — L89312 Pressure ulcer of right buttock, stage 2: Secondary | ICD-10-CM | POA: Diagnosis not present

## 2017-05-01 DIAGNOSIS — N186 End stage renal disease: Secondary | ICD-10-CM | POA: Diagnosis not present

## 2017-05-01 DIAGNOSIS — S72041D Displaced fracture of base of neck of right femur, subsequent encounter for closed fracture with routine healing: Secondary | ICD-10-CM | POA: Diagnosis not present

## 2017-05-02 DIAGNOSIS — I2510 Atherosclerotic heart disease of native coronary artery without angina pectoris: Secondary | ICD-10-CM | POA: Diagnosis not present

## 2017-05-02 DIAGNOSIS — N186 End stage renal disease: Secondary | ICD-10-CM | POA: Diagnosis not present

## 2017-05-03 DIAGNOSIS — N186 End stage renal disease: Secondary | ICD-10-CM | POA: Diagnosis not present

## 2017-05-03 DIAGNOSIS — E119 Type 2 diabetes mellitus without complications: Secondary | ICD-10-CM | POA: Diagnosis not present

## 2017-05-03 DIAGNOSIS — N2581 Secondary hyperparathyroidism of renal origin: Secondary | ICD-10-CM | POA: Diagnosis not present

## 2017-05-03 DIAGNOSIS — D631 Anemia in chronic kidney disease: Secondary | ICD-10-CM | POA: Diagnosis not present

## 2017-05-05 DIAGNOSIS — E1122 Type 2 diabetes mellitus with diabetic chronic kidney disease: Secondary | ICD-10-CM | POA: Diagnosis not present

## 2017-05-05 DIAGNOSIS — E119 Type 2 diabetes mellitus without complications: Secondary | ICD-10-CM | POA: Diagnosis not present

## 2017-05-05 DIAGNOSIS — G894 Chronic pain syndrome: Secondary | ICD-10-CM | POA: Diagnosis not present

## 2017-05-05 DIAGNOSIS — Z992 Dependence on renal dialysis: Secondary | ICD-10-CM | POA: Diagnosis not present

## 2017-05-05 DIAGNOSIS — N2581 Secondary hyperparathyroidism of renal origin: Secondary | ICD-10-CM | POA: Diagnosis not present

## 2017-05-05 DIAGNOSIS — I10 Essential (primary) hypertension: Secondary | ICD-10-CM | POA: Diagnosis not present

## 2017-05-05 DIAGNOSIS — N186 End stage renal disease: Secondary | ICD-10-CM | POA: Diagnosis not present

## 2017-05-05 DIAGNOSIS — D631 Anemia in chronic kidney disease: Secondary | ICD-10-CM | POA: Diagnosis not present

## 2017-05-06 DIAGNOSIS — S72041D Displaced fracture of base of neck of right femur, subsequent encounter for closed fracture with routine healing: Secondary | ICD-10-CM | POA: Diagnosis not present

## 2017-05-06 DIAGNOSIS — L89312 Pressure ulcer of right buttock, stage 2: Secondary | ICD-10-CM | POA: Diagnosis not present

## 2017-05-07 DIAGNOSIS — E119 Type 2 diabetes mellitus without complications: Secondary | ICD-10-CM | POA: Diagnosis not present

## 2017-05-08 DIAGNOSIS — N2581 Secondary hyperparathyroidism of renal origin: Secondary | ICD-10-CM | POA: Diagnosis not present

## 2017-05-08 DIAGNOSIS — N186 End stage renal disease: Secondary | ICD-10-CM | POA: Diagnosis not present

## 2017-05-08 DIAGNOSIS — L89312 Pressure ulcer of right buttock, stage 2: Secondary | ICD-10-CM | POA: Diagnosis not present

## 2017-05-08 DIAGNOSIS — S72041D Displaced fracture of base of neck of right femur, subsequent encounter for closed fracture with routine healing: Secondary | ICD-10-CM | POA: Diagnosis not present

## 2017-05-08 DIAGNOSIS — D631 Anemia in chronic kidney disease: Secondary | ICD-10-CM | POA: Diagnosis not present

## 2017-05-10 DIAGNOSIS — D631 Anemia in chronic kidney disease: Secondary | ICD-10-CM | POA: Diagnosis not present

## 2017-05-10 DIAGNOSIS — N2581 Secondary hyperparathyroidism of renal origin: Secondary | ICD-10-CM | POA: Diagnosis not present

## 2017-05-10 DIAGNOSIS — N186 End stage renal disease: Secondary | ICD-10-CM | POA: Diagnosis not present

## 2017-05-13 DIAGNOSIS — N186 End stage renal disease: Secondary | ICD-10-CM | POA: Diagnosis not present

## 2017-05-13 DIAGNOSIS — D631 Anemia in chronic kidney disease: Secondary | ICD-10-CM | POA: Diagnosis not present

## 2017-05-13 DIAGNOSIS — N2581 Secondary hyperparathyroidism of renal origin: Secondary | ICD-10-CM | POA: Diagnosis not present

## 2017-05-14 DIAGNOSIS — L89312 Pressure ulcer of right buttock, stage 2: Secondary | ICD-10-CM | POA: Diagnosis not present

## 2017-05-14 DIAGNOSIS — S72041D Displaced fracture of base of neck of right femur, subsequent encounter for closed fracture with routine healing: Secondary | ICD-10-CM | POA: Diagnosis not present

## 2017-05-15 DIAGNOSIS — D631 Anemia in chronic kidney disease: Secondary | ICD-10-CM | POA: Diagnosis not present

## 2017-05-15 DIAGNOSIS — N2581 Secondary hyperparathyroidism of renal origin: Secondary | ICD-10-CM | POA: Diagnosis not present

## 2017-05-15 DIAGNOSIS — N186 End stage renal disease: Secondary | ICD-10-CM | POA: Diagnosis not present

## 2017-05-17 DIAGNOSIS — N186 End stage renal disease: Secondary | ICD-10-CM | POA: Diagnosis not present

## 2017-05-17 DIAGNOSIS — D631 Anemia in chronic kidney disease: Secondary | ICD-10-CM | POA: Diagnosis not present

## 2017-05-17 DIAGNOSIS — N2581 Secondary hyperparathyroidism of renal origin: Secondary | ICD-10-CM | POA: Diagnosis not present

## 2017-05-20 DIAGNOSIS — N2581 Secondary hyperparathyroidism of renal origin: Secondary | ICD-10-CM | POA: Diagnosis not present

## 2017-05-20 DIAGNOSIS — D631 Anemia in chronic kidney disease: Secondary | ICD-10-CM | POA: Diagnosis not present

## 2017-05-20 DIAGNOSIS — N186 End stage renal disease: Secondary | ICD-10-CM | POA: Diagnosis not present

## 2017-05-22 DIAGNOSIS — N186 End stage renal disease: Secondary | ICD-10-CM | POA: Diagnosis not present

## 2017-05-22 DIAGNOSIS — N2581 Secondary hyperparathyroidism of renal origin: Secondary | ICD-10-CM | POA: Diagnosis not present

## 2017-05-22 DIAGNOSIS — D631 Anemia in chronic kidney disease: Secondary | ICD-10-CM | POA: Diagnosis not present

## 2017-05-23 DIAGNOSIS — K219 Gastro-esophageal reflux disease without esophagitis: Secondary | ICD-10-CM | POA: Diagnosis not present

## 2017-05-23 DIAGNOSIS — Z992 Dependence on renal dialysis: Secondary | ICD-10-CM | POA: Diagnosis not present

## 2017-05-23 DIAGNOSIS — M6281 Muscle weakness (generalized): Secondary | ICD-10-CM | POA: Diagnosis not present

## 2017-05-23 DIAGNOSIS — I10 Essential (primary) hypertension: Secondary | ICD-10-CM | POA: Diagnosis not present

## 2017-05-23 DIAGNOSIS — L89159 Pressure ulcer of sacral region, unspecified stage: Secondary | ICD-10-CM | POA: Diagnosis not present

## 2017-05-24 DIAGNOSIS — N186 End stage renal disease: Secondary | ICD-10-CM | POA: Diagnosis not present

## 2017-05-24 DIAGNOSIS — D631 Anemia in chronic kidney disease: Secondary | ICD-10-CM | POA: Diagnosis not present

## 2017-05-24 DIAGNOSIS — N2581 Secondary hyperparathyroidism of renal origin: Secondary | ICD-10-CM | POA: Diagnosis not present

## 2017-05-26 ENCOUNTER — Encounter (INDEPENDENT_AMBULATORY_CARE_PROVIDER_SITE_OTHER): Payer: Self-pay | Admitting: Vascular Surgery

## 2017-05-26 ENCOUNTER — Ambulatory Visit (INDEPENDENT_AMBULATORY_CARE_PROVIDER_SITE_OTHER): Payer: Medicare Other

## 2017-05-26 ENCOUNTER — Ambulatory Visit (INDEPENDENT_AMBULATORY_CARE_PROVIDER_SITE_OTHER): Payer: Medicare Other | Admitting: Vascular Surgery

## 2017-05-26 VITALS — BP 138/74 | HR 78 | Resp 17 | Ht 59.0 in | Wt 165.0 lb

## 2017-05-26 DIAGNOSIS — E782 Mixed hyperlipidemia: Secondary | ICD-10-CM

## 2017-05-26 DIAGNOSIS — I871 Compression of vein: Secondary | ICD-10-CM

## 2017-05-26 DIAGNOSIS — T829XXS Unspecified complication of cardiac and vascular prosthetic device, implant and graft, sequela: Secondary | ICD-10-CM

## 2017-05-26 DIAGNOSIS — N2889 Other specified disorders of kidney and ureter: Secondary | ICD-10-CM

## 2017-05-26 DIAGNOSIS — I151 Hypertension secondary to other renal disorders: Secondary | ICD-10-CM | POA: Diagnosis not present

## 2017-05-26 DIAGNOSIS — N186 End stage renal disease: Secondary | ICD-10-CM

## 2017-05-26 NOTE — Progress Notes (Signed)
MRN : [id number]  Kelli Nguyen is a 72 y.o. (Mar 01, 1946) female who presents with chief complaint of  Chief Complaint  Patient presents with  . Follow-up    6 month HDA  .  History of Present Illness: The patient returns to the office for followup of their dialysis access. The function of the access has been stable. The patient denies increased bleeding time or increased recirculation. Patient denies difficulty with cannulation. The patient denies hand pain or other symptoms consistent with steal phenomena.  No significant arm swelling.  The patient denies redness or swelling at the access site. The patient denies fever or chills at home or while on dialysis.  The patient denies amaurosis fugax or recent TIA symptoms. There are no recent neurological changes noted. The patient denies claudication symptoms or rest pain symptoms. The patient denies history of DVT, PE or superficial thrombophlebitis. The patient denies recent episodes of angina or shortness of breath.     Current Meds  Medication Sig  . acetaminophen (TYLENOL) 500 MG tablet Tylenol Extra Strength 500 mg tablet  Take 2 tablets every 8 hours by oral route as needed.  [name] aspirin 81 MG tablet Take 81 mg by mouth daily.  . cinacalcet (SENSIPAR) 30 MG tablet Take 30 mg by mouth 3 (three) times a week. On dialysis days - Tues, Thurs, Sat  . DiphenhydrAMINE HCl, Sleep, 50 MG tablet Take 1 tablet by mouth 30 minutes before CT scan  . DUREZOL 0.05 % EMUL Place 1 drop into both eyes 2 (two) times daily.  . isosorbide mononitrate (IMDUR) 30 MG 24 hr tablet Take 30 mg by mouth daily.  [name] latanoprost (XALATAN) 0.005 % ophthalmic solution Place 1 drop into both eyes 2 (two) times daily.   . metoprolol succinate (TOPROL-XL) 25 MG 24 hr tablet Take 25 mg by mouth daily.  [name] POLYETHYLENE GLYCOL 3350 PO polyethylene glycol 3350 17 gram/dose oral powder  Take 17 g every day by oral route.  . pravastatin (PRAVACHOL) 20 MG tablet Take 20  mg by mouth daily.  . sevelamer carbonate (RENVELA) 800 MG tablet Take 1,600 mg by mouth 3 (three) times daily with meals.   . timolol (TIMOPTIC) 0.5 % ophthalmic solution Administer 1 drop to both eyes daily.     Past Medical History:  Diagnosis Date  . Anemia   . Chronic cough   . Chronic kidney disease   . Coronary artery disease   . Diabetes mellitus without complication ([place])   . Dialysis patient ([place])    T-TH-SAT  . GERD (gastroesophageal reflux disease)   . Hypertension   . Neuropathy    hands  . Wears dentures    partial upper and lower    Past Surgical History:  Procedure Laterality Date  . A/V SHUNT INTERVENTION N/A 10/16/2016   Procedure: A/V Shunt Intervention;  Surgeon: Katha Cabal, MD;  Location: [place] [facility];  Service: Cardiovascular;  Laterality: N/A;  . A/V SHUNTOGRAM Left 10/16/2016   Procedure: A/V Shuntogram;  Surgeon: Katha Cabal, MD;  Location: [place] [facility];  Service: Cardiovascular;  Laterality: Left;  . ABDOMINAL HYSTERECTOMY    . CARPAL TUNNEL RELEASE Left 09/27/2015   Procedure: CARPAL TUNNEL RELEASE;  Surgeon: Leanor Kail, MD;  Location: [facility];  Service: Orthopedics;  Laterality: Left;  . CATARACT EXTRACTION W/PHACO Right 09/23/2016   Procedure: CATARACT EXTRACTION PHACO AND INTRAOCULAR LENS PLACEMENT ([place]) complicated diabetic right;  Surgeon: Ronnell Freshwater, MD;  Location: [place];  Service: Ophthalmology;  Laterality: Right;  Diabetic - diet controlled Potassium draw before surgery  . CATARACT EXTRACTION W/PHACO Left 01/20/2017   Procedure: CATARACT EXTRACTION PHACO AND INTRAOCULAR LENS PLACEMENT (IOC) LEFT DIABETIC;  Surgeon: Eulogio Bear, MD;  Location: [place];  Service: Ophthalmology;  Laterality: Left;  Diabetic - diet controlled Potassium draw prior to procedure  8:00 ARRIVAL  . COLONOSCOPY    . COLONOSCOPY WITH PROPOFOL N/A 11/28/2014   Procedure: COLONOSCOPY WITH  PROPOFOL;  Surgeon: Manya Silvas, MD;  Location: [facility];  Service: Endoscopy;  Laterality: N/A;  . CORONARY ARTERY BYPASS GRAFT    . PERIPHERAL VASCULAR CATHETERIZATION Left 01/18/2015   Procedure: A/V Shuntogram/Fistulagram;  Surgeon: Katha Cabal, MD;  Location: [place] [facility];  Service: Cardiovascular;  Laterality: Left;  . PERIPHERAL VASCULAR CATHETERIZATION Left 01/18/2015   Procedure: A/V Shunt Intervention;  Surgeon: Katha Cabal, MD;  Location: [place] [facility];  Service: Cardiovascular;  Laterality: Left;  . PERIPHERAL VASCULAR CATHETERIZATION N/A 08/10/2015   Procedure: Thrombectomy;  Surgeon: Algernon Huxley, MD;  Location: [place] [facility];  Service: Cardiovascular;  Laterality: N/A;  . PERIPHERAL VASCULAR CATHETERIZATION N/A 08/10/2015   Procedure: A/V Shuntogram/Fistulagram;  Surgeon: Algernon Huxley, MD;  Location: [place] [facility];  Service: Cardiovascular;  Laterality: N/A;  . PERIPHERAL VASCULAR CATHETERIZATION N/A 08/10/2015   Procedure: A/V Shunt Intervention;  Surgeon: Algernon Huxley, MD;  Location: [place] [facility];  Service: Cardiovascular;  Laterality: N/A;  . RENAL SHUNTS      Social History Social History   Tobacco Use  . Smoking status: Never Smoker  . Smokeless tobacco: Never Used  Substance Use Topics  . Alcohol use: No  . Drug use: No    Family History History reviewed. No pertinent family history.  Allergies  Allergen Reactions  . Codeine Sulfate Nausea Only  . Contrast Media [Iodinated Diagnostic Agents] Hives  . Gadobutrol Hives  . Gadolinium Derivatives Hives  . Penicillins Hives and Itching    Has patient had a PCN reaction causing immediate rash, facial/tongue/throat swelling, SOB or lightheadedness with hypotension: No Has patient had a PCN reaction causing severe rash involving mucus membranes or skin necrosis: No Has patient had a PCN reaction that required hospitalization: No Has patient had a PCN reaction  occurring within the last 10 years: No If all of the above answers are "NO", then may proceed with Cephalosporin use.   . Statins Other (See Comments)    muscle pain     REVIEW OF SYSTEMS (Negative unless checked)  Constitutional: [] Weight loss  [] Fever  [] Chills Cardiac: [] Chest pain   [] Chest pressure   [] Palpitations   [] Shortness of breath when laying flat   [] Shortness of breath with exertion. Vascular:  [] Pain in legs with walking   [] Pain in legs at rest  [] History of DVT   [] Phlebitis   [] Swelling in legs   [] Varicose veins   [] Non-healing ulcers Pulmonary:   [] Uses home oxygen   [] Productive cough   [] Hemoptysis   [] Wheeze  [] COPD   [] Asthma Neurologic:  [] Dizziness   [] Seizures   [] History of stroke   [] History of TIA  [] Aphasia   [] Vissual changes   [] Weakness or numbness in arm   [] Weakness or numbness in leg Musculoskeletal:   [] Joint swelling   [] Joint pain   [] Low back pain Hematologic:  [] Easy bruising  [] Easy bleeding   [] Hypercoagulable state   [] Anemic Gastrointestinal:  []   Diarrhea   [] Vomiting  [] Gastroesophageal reflux/heartburn   [] Difficulty swallowing. Genitourinary:  [x] Chronic kidney disease   [] Difficult urination  [] Frequent urination   [] Blood in urine Skin:  [] Rashes   [] Ulcers  Psychological:  [] History of anxiety   []  History of major depression.  Physical Examination  Vitals:   05/26/17 1628  BP: 138/74  Pulse: 78  Resp: 17  Weight: 165 lb (74.8 kg)  Height: 4\' 11"  (1.499 m)   Body mass index is 33.33 kg/m. Gen: WD/WN, NAD Head: /AT, No temporalis wasting.  Ear/Nose/Throat: Hearing grossly intact, nares w/o erythema or drainage Eyes: PER, EOMI, sclera nonicteric.  Neck: Supple, no large masses.   Pulmonary:  Good air movement, no audible wheezing bilaterally, no use of accessory muscles.  Cardiac: RRR, no JVD Vascular: left arm graft good thrill good bruit Vessel Right Left  Radial Palpable Palpable  Ulnar Palpable Palpable  Brachial  Palpable Palpable  Gastrointestinal: Non-distended. No guarding/no peritoneal signs.  Musculoskeletal: M/S 5/5 throughout.  No deformity or atrophy.  Neurologic: CN 2-12 intact. Symmetrical.  Speech is fluent. Motor exam as listed above. Psychiatric: Judgment intact, Mood & affect appropriate for pt's clinical situation. Dermatologic: No rashes or ulcers noted.  No changes consistent with cellulitis. Lymph : No lichenification or skin changes of chronic lymphedema.  CBC Lab Results  Component Value Date   WBC 8.3 09/26/2015   HGB 13.3 09/27/2015   HCT 39.0 09/27/2015   MCV 81.1 09/26/2015   PLT 135 (L) 09/26/2015    BMET    Component Value Date/Time   NA 137 09/27/2015 0900   NA 135 (L) 03/29/2013 1409   K 4.7 01/20/2017 0815   K 4.5 03/29/2013 1409   CL 99 (L) 09/26/2015 1517   CL 97 (L) 03/29/2013 1409   CO2 32 09/26/2015 1517   CO2 27 03/29/2013 1409   GLUCOSE 102 (H) 09/27/2015 0900   GLUCOSE 97 03/29/2013 1409   BUN 28 (H) 09/26/2015 1517   BUN 48 (H) 03/29/2013 1409   CREATININE 7.13 (H) 09/26/2015 1517   CREATININE 12.06 (H) 03/29/2013 1409   CALCIUM 8.5 (L) 09/26/2015 1517   CALCIUM 8.8 03/29/2013 1409   GFRNONAA 5 (L) 09/26/2015 1517   GFRNONAA 3 (L) 03/29/2013 1409   GFRAA 6 (L) 09/26/2015 1517   GFRAA 3 (L) 03/29/2013 1409   CrCl cannot be calculated (Patient's most recent lab result is older than the maximum 21 days allowed.).  COAG No results found for: INR, PROTIME  Radiology No results found.  Assessment/Plan 1. End stage renal disease ([place]) Recommend:  The patient is doing well and currently has adequate dialysis access. The patient's dialysis center is not reporting any access issues. Flow pattern is stable when compared to the prior ultrasound.  The patient should have a duplex ultrasound of the dialysis access in 6 months.  The patient will follow-up with me in the office after each ultrasound   2. Superior vena cava compression  syndrome Continue HERO graft  3. Complication of vascular access for dialysis, sequela See #1 - VAS US DUPLEX DIALYSIS ACCESS (AVF, AVG); Future  4. Mixed hyperlipidemia Continue statin as ordered and reviewed, no changes at this time  5. Hypertension secondary to other renal disorders Continue antihypertensive medications as already ordered, these medications have been reviewed and there are no changes at this time.      Hortencia Pilar, MD  05/26/2017 8:41 PM

## 2017-05-27 DIAGNOSIS — N186 End stage renal disease: Secondary | ICD-10-CM | POA: Diagnosis not present

## 2017-05-27 DIAGNOSIS — D631 Anemia in chronic kidney disease: Secondary | ICD-10-CM | POA: Diagnosis not present

## 2017-05-27 DIAGNOSIS — N2581 Secondary hyperparathyroidism of renal origin: Secondary | ICD-10-CM | POA: Diagnosis not present

## 2017-05-29 DIAGNOSIS — D631 Anemia in chronic kidney disease: Secondary | ICD-10-CM | POA: Diagnosis not present

## 2017-05-29 DIAGNOSIS — N2581 Secondary hyperparathyroidism of renal origin: Secondary | ICD-10-CM | POA: Diagnosis not present

## 2017-05-29 DIAGNOSIS — N186 End stage renal disease: Secondary | ICD-10-CM | POA: Diagnosis not present

## 2017-05-31 DIAGNOSIS — D631 Anemia in chronic kidney disease: Secondary | ICD-10-CM | POA: Diagnosis not present

## 2017-05-31 DIAGNOSIS — N2581 Secondary hyperparathyroidism of renal origin: Secondary | ICD-10-CM | POA: Diagnosis not present

## 2017-05-31 DIAGNOSIS — N186 End stage renal disease: Secondary | ICD-10-CM | POA: Diagnosis not present

## 2017-06-03 DIAGNOSIS — S72002S Fracture of unspecified part of neck of left femur, sequela: Secondary | ICD-10-CM | POA: Diagnosis not present

## 2017-06-03 DIAGNOSIS — Z96642 Presence of left artificial hip joint: Secondary | ICD-10-CM | POA: Diagnosis not present

## 2017-06-03 DIAGNOSIS — N186 End stage renal disease: Secondary | ICD-10-CM | POA: Diagnosis not present

## 2017-06-03 DIAGNOSIS — M5136 Other intervertebral disc degeneration, lumbar region: Secondary | ICD-10-CM | POA: Diagnosis not present

## 2017-06-03 DIAGNOSIS — N2581 Secondary hyperparathyroidism of renal origin: Secondary | ICD-10-CM | POA: Diagnosis not present

## 2017-06-03 DIAGNOSIS — D631 Anemia in chronic kidney disease: Secondary | ICD-10-CM | POA: Diagnosis not present

## 2017-06-05 DIAGNOSIS — Z992 Dependence on renal dialysis: Secondary | ICD-10-CM | POA: Diagnosis not present

## 2017-06-05 DIAGNOSIS — D631 Anemia in chronic kidney disease: Secondary | ICD-10-CM | POA: Diagnosis not present

## 2017-06-05 DIAGNOSIS — N2581 Secondary hyperparathyroidism of renal origin: Secondary | ICD-10-CM | POA: Diagnosis not present

## 2017-06-05 DIAGNOSIS — I1311 Hypertensive heart and chronic kidney disease without heart failure, with stage 5 chronic kidney disease, or end stage renal disease: Secondary | ICD-10-CM | POA: Diagnosis not present

## 2017-06-05 DIAGNOSIS — E1122 Type 2 diabetes mellitus with diabetic chronic kidney disease: Secondary | ICD-10-CM | POA: Diagnosis not present

## 2017-06-05 DIAGNOSIS — N186 End stage renal disease: Secondary | ICD-10-CM | POA: Diagnosis not present

## 2017-06-05 DIAGNOSIS — I2510 Atherosclerotic heart disease of native coronary artery without angina pectoris: Secondary | ICD-10-CM | POA: Diagnosis not present

## 2017-06-07 DIAGNOSIS — N186 End stage renal disease: Secondary | ICD-10-CM | POA: Diagnosis not present

## 2017-06-07 DIAGNOSIS — N2581 Secondary hyperparathyroidism of renal origin: Secondary | ICD-10-CM | POA: Diagnosis not present

## 2017-06-10 DIAGNOSIS — N186 End stage renal disease: Secondary | ICD-10-CM | POA: Diagnosis not present

## 2017-06-10 DIAGNOSIS — N2581 Secondary hyperparathyroidism of renal origin: Secondary | ICD-10-CM | POA: Diagnosis not present

## 2017-06-12 DIAGNOSIS — N186 End stage renal disease: Secondary | ICD-10-CM | POA: Diagnosis not present

## 2017-06-12 DIAGNOSIS — N2581 Secondary hyperparathyroidism of renal origin: Secondary | ICD-10-CM | POA: Diagnosis not present

## 2017-06-14 DIAGNOSIS — N186 End stage renal disease: Secondary | ICD-10-CM | POA: Diagnosis not present

## 2017-06-14 DIAGNOSIS — N2581 Secondary hyperparathyroidism of renal origin: Secondary | ICD-10-CM | POA: Diagnosis not present

## 2017-06-17 DIAGNOSIS — N2581 Secondary hyperparathyroidism of renal origin: Secondary | ICD-10-CM | POA: Diagnosis not present

## 2017-06-17 DIAGNOSIS — N186 End stage renal disease: Secondary | ICD-10-CM | POA: Diagnosis not present

## 2017-06-19 DIAGNOSIS — N2581 Secondary hyperparathyroidism of renal origin: Secondary | ICD-10-CM | POA: Diagnosis not present

## 2017-06-19 DIAGNOSIS — N186 End stage renal disease: Secondary | ICD-10-CM | POA: Diagnosis not present

## 2017-06-21 DIAGNOSIS — N186 End stage renal disease: Secondary | ICD-10-CM | POA: Diagnosis not present

## 2017-06-21 DIAGNOSIS — N2581 Secondary hyperparathyroidism of renal origin: Secondary | ICD-10-CM | POA: Diagnosis not present

## 2017-06-24 DIAGNOSIS — N186 End stage renal disease: Secondary | ICD-10-CM | POA: Diagnosis not present

## 2017-06-24 DIAGNOSIS — N2581 Secondary hyperparathyroidism of renal origin: Secondary | ICD-10-CM | POA: Diagnosis not present

## 2017-06-26 DIAGNOSIS — N2581 Secondary hyperparathyroidism of renal origin: Secondary | ICD-10-CM | POA: Diagnosis not present

## 2017-06-26 DIAGNOSIS — N186 End stage renal disease: Secondary | ICD-10-CM | POA: Diagnosis not present

## 2017-06-27 DIAGNOSIS — I2510 Atherosclerotic heart disease of native coronary artery without angina pectoris: Secondary | ICD-10-CM | POA: Diagnosis not present

## 2017-06-27 DIAGNOSIS — Z992 Dependence on renal dialysis: Secondary | ICD-10-CM | POA: Diagnosis not present

## 2017-06-27 DIAGNOSIS — I4510 Unspecified right bundle-branch block: Secondary | ICD-10-CM | POA: Diagnosis not present

## 2017-06-27 DIAGNOSIS — Z880 Allergy status to penicillin: Secondary | ICD-10-CM | POA: Diagnosis not present

## 2017-06-27 DIAGNOSIS — I361 Nonrheumatic tricuspid (valve) insufficiency: Secondary | ICD-10-CM | POA: Diagnosis not present

## 2017-06-27 DIAGNOSIS — R6521 Severe sepsis with septic shock: Secondary | ICD-10-CM | POA: Diagnosis not present

## 2017-06-27 DIAGNOSIS — E875 Hyperkalemia: Secondary | ICD-10-CM | POA: Diagnosis not present

## 2017-06-27 DIAGNOSIS — Z7982 Long term (current) use of aspirin: Secondary | ICD-10-CM | POA: Diagnosis not present

## 2017-06-27 DIAGNOSIS — J9811 Atelectasis: Secondary | ICD-10-CM | POA: Diagnosis not present

## 2017-06-27 DIAGNOSIS — D631 Anemia in chronic kidney disease: Secondary | ICD-10-CM | POA: Diagnosis not present

## 2017-06-27 DIAGNOSIS — M1990 Unspecified osteoarthritis, unspecified site: Secondary | ICD-10-CM | POA: Diagnosis present

## 2017-06-27 DIAGNOSIS — J90 Pleural effusion, not elsewhere classified: Secondary | ICD-10-CM | POA: Diagnosis not present

## 2017-06-27 DIAGNOSIS — R05 Cough: Secondary | ICD-10-CM | POA: Diagnosis not present

## 2017-06-27 DIAGNOSIS — R509 Fever, unspecified: Secondary | ICD-10-CM | POA: Diagnosis not present

## 2017-06-27 DIAGNOSIS — I120 Hypertensive chronic kidney disease with stage 5 chronic kidney disease or end stage renal disease: Secondary | ICD-10-CM | POA: Diagnosis not present

## 2017-06-27 DIAGNOSIS — E1122 Type 2 diabetes mellitus with diabetic chronic kidney disease: Secondary | ICD-10-CM | POA: Diagnosis not present

## 2017-06-27 DIAGNOSIS — D72829 Elevated white blood cell count, unspecified: Secondary | ICD-10-CM | POA: Diagnosis not present

## 2017-06-27 DIAGNOSIS — Z905 Acquired absence of kidney: Secondary | ICD-10-CM | POA: Diagnosis not present

## 2017-06-27 DIAGNOSIS — J189 Pneumonia, unspecified organism: Secondary | ICD-10-CM | POA: Diagnosis not present

## 2017-06-27 DIAGNOSIS — N186 End stage renal disease: Secondary | ICD-10-CM | POA: Diagnosis not present

## 2017-06-27 DIAGNOSIS — A419 Sepsis, unspecified organism: Secondary | ICD-10-CM | POA: Diagnosis present

## 2017-06-27 DIAGNOSIS — I10 Essential (primary) hypertension: Secondary | ICD-10-CM | POA: Diagnosis not present

## 2017-06-27 DIAGNOSIS — I950 Idiopathic hypotension: Secondary | ICD-10-CM | POA: Diagnosis not present

## 2017-06-27 DIAGNOSIS — D649 Anemia, unspecified: Secondary | ICD-10-CM | POA: Diagnosis not present

## 2017-06-27 DIAGNOSIS — I340 Nonrheumatic mitral (valve) insufficiency: Secondary | ICD-10-CM | POA: Diagnosis not present

## 2017-06-27 DIAGNOSIS — R0989 Other specified symptoms and signs involving the circulatory and respiratory systems: Secondary | ICD-10-CM | POA: Diagnosis not present

## 2017-06-27 DIAGNOSIS — R0602 Shortness of breath: Secondary | ICD-10-CM | POA: Diagnosis not present

## 2017-06-27 DIAGNOSIS — E1140 Type 2 diabetes mellitus with diabetic neuropathy, unspecified: Secondary | ICD-10-CM | POA: Diagnosis present

## 2017-06-27 DIAGNOSIS — I4581 Long QT syndrome: Secondary | ICD-10-CM | POA: Diagnosis present

## 2017-06-27 DIAGNOSIS — I959 Hypotension, unspecified: Secondary | ICD-10-CM | POA: Diagnosis not present

## 2017-06-27 DIAGNOSIS — R918 Other nonspecific abnormal finding of lung field: Secondary | ICD-10-CM | POA: Diagnosis not present

## 2017-06-27 DIAGNOSIS — Z452 Encounter for adjustment and management of vascular access device: Secondary | ICD-10-CM | POA: Diagnosis not present

## 2017-06-27 DIAGNOSIS — I700 Atherosclerosis of aorta: Secondary | ICD-10-CM | POA: Diagnosis not present

## 2017-06-27 DIAGNOSIS — Z951 Presence of aortocoronary bypass graft: Secondary | ICD-10-CM | POA: Diagnosis not present

## 2017-06-27 DIAGNOSIS — N250 Renal osteodystrophy: Secondary | ICD-10-CM | POA: Diagnosis not present

## 2017-06-27 DIAGNOSIS — K219 Gastro-esophageal reflux disease without esophagitis: Secondary | ICD-10-CM | POA: Diagnosis present

## 2017-06-28 DIAGNOSIS — A419 Sepsis, unspecified organism: Secondary | ICD-10-CM | POA: Insufficient documentation

## 2017-06-28 HISTORY — DX: Sepsis, unspecified organism: A41.9

## 2017-07-03 DIAGNOSIS — Z992 Dependence on renal dialysis: Secondary | ICD-10-CM | POA: Diagnosis not present

## 2017-07-03 DIAGNOSIS — E1122 Type 2 diabetes mellitus with diabetic chronic kidney disease: Secondary | ICD-10-CM | POA: Diagnosis not present

## 2017-07-03 DIAGNOSIS — N186 End stage renal disease: Secondary | ICD-10-CM | POA: Diagnosis not present

## 2017-07-10 DIAGNOSIS — Z992 Dependence on renal dialysis: Secondary | ICD-10-CM | POA: Diagnosis not present

## 2017-07-10 DIAGNOSIS — N186 End stage renal disease: Secondary | ICD-10-CM | POA: Diagnosis not present

## 2017-07-10 DIAGNOSIS — I1311 Hypertensive heart and chronic kidney disease without heart failure, with stage 5 chronic kidney disease, or end stage renal disease: Secondary | ICD-10-CM | POA: Diagnosis not present

## 2017-07-10 DIAGNOSIS — J189 Pneumonia, unspecified organism: Secondary | ICD-10-CM | POA: Diagnosis not present

## 2017-07-10 DIAGNOSIS — M859 Disorder of bone density and structure, unspecified: Secondary | ICD-10-CM | POA: Diagnosis not present

## 2017-07-10 DIAGNOSIS — N2581 Secondary hyperparathyroidism of renal origin: Secondary | ICD-10-CM | POA: Diagnosis not present

## 2017-07-10 DIAGNOSIS — Z951 Presence of aortocoronary bypass graft: Secondary | ICD-10-CM | POA: Diagnosis not present

## 2017-07-10 DIAGNOSIS — I2510 Atherosclerotic heart disease of native coronary artery without angina pectoris: Secondary | ICD-10-CM | POA: Diagnosis not present

## 2017-07-10 DIAGNOSIS — I959 Hypotension, unspecified: Secondary | ICD-10-CM | POA: Diagnosis not present

## 2017-07-10 DIAGNOSIS — D631 Anemia in chronic kidney disease: Secondary | ICD-10-CM | POA: Diagnosis not present

## 2017-07-10 DIAGNOSIS — M1990 Unspecified osteoarthritis, unspecified site: Secondary | ICD-10-CM | POA: Diagnosis not present

## 2017-07-10 DIAGNOSIS — Z905 Acquired absence of kidney: Secondary | ICD-10-CM | POA: Diagnosis not present

## 2017-07-10 DIAGNOSIS — Z7982 Long term (current) use of aspirin: Secondary | ICD-10-CM | POA: Diagnosis not present

## 2017-07-10 DIAGNOSIS — G5602 Carpal tunnel syndrome, left upper limb: Secondary | ICD-10-CM | POA: Diagnosis not present

## 2017-07-10 DIAGNOSIS — E1122 Type 2 diabetes mellitus with diabetic chronic kidney disease: Secondary | ICD-10-CM | POA: Diagnosis not present

## 2017-07-10 DIAGNOSIS — Z85528 Personal history of other malignant neoplasm of kidney: Secondary | ICD-10-CM | POA: Diagnosis not present

## 2017-07-10 DIAGNOSIS — K219 Gastro-esophageal reflux disease without esophagitis: Secondary | ICD-10-CM | POA: Diagnosis not present

## 2017-07-10 DIAGNOSIS — E1136 Type 2 diabetes mellitus with diabetic cataract: Secondary | ICD-10-CM | POA: Diagnosis not present

## 2017-07-11 DIAGNOSIS — I959 Hypotension, unspecified: Secondary | ICD-10-CM | POA: Diagnosis not present

## 2017-07-11 DIAGNOSIS — J189 Pneumonia, unspecified organism: Secondary | ICD-10-CM | POA: Diagnosis not present

## 2017-07-11 DIAGNOSIS — E1122 Type 2 diabetes mellitus with diabetic chronic kidney disease: Secondary | ICD-10-CM | POA: Diagnosis not present

## 2017-07-11 DIAGNOSIS — I1311 Hypertensive heart and chronic kidney disease without heart failure, with stage 5 chronic kidney disease, or end stage renal disease: Secondary | ICD-10-CM | POA: Diagnosis not present

## 2017-07-11 DIAGNOSIS — I2510 Atherosclerotic heart disease of native coronary artery without angina pectoris: Secondary | ICD-10-CM | POA: Diagnosis not present

## 2017-07-11 DIAGNOSIS — N186 End stage renal disease: Secondary | ICD-10-CM | POA: Diagnosis not present

## 2017-07-12 DIAGNOSIS — N2581 Secondary hyperparathyroidism of renal origin: Secondary | ICD-10-CM | POA: Diagnosis not present

## 2017-07-12 DIAGNOSIS — D631 Anemia in chronic kidney disease: Secondary | ICD-10-CM | POA: Diagnosis not present

## 2017-07-12 DIAGNOSIS — N186 End stage renal disease: Secondary | ICD-10-CM | POA: Diagnosis not present

## 2017-07-14 DIAGNOSIS — I959 Hypotension, unspecified: Secondary | ICD-10-CM | POA: Diagnosis not present

## 2017-07-14 DIAGNOSIS — E1122 Type 2 diabetes mellitus with diabetic chronic kidney disease: Secondary | ICD-10-CM | POA: Diagnosis not present

## 2017-07-14 DIAGNOSIS — J189 Pneumonia, unspecified organism: Secondary | ICD-10-CM | POA: Diagnosis not present

## 2017-07-14 DIAGNOSIS — I1311 Hypertensive heart and chronic kidney disease without heart failure, with stage 5 chronic kidney disease, or end stage renal disease: Secondary | ICD-10-CM | POA: Diagnosis not present

## 2017-07-14 DIAGNOSIS — N186 End stage renal disease: Secondary | ICD-10-CM | POA: Diagnosis not present

## 2017-07-14 DIAGNOSIS — I2510 Atherosclerotic heart disease of native coronary artery without angina pectoris: Secondary | ICD-10-CM | POA: Diagnosis not present

## 2017-07-15 DIAGNOSIS — N2581 Secondary hyperparathyroidism of renal origin: Secondary | ICD-10-CM | POA: Diagnosis not present

## 2017-07-15 DIAGNOSIS — D631 Anemia in chronic kidney disease: Secondary | ICD-10-CM | POA: Diagnosis not present

## 2017-07-15 DIAGNOSIS — N186 End stage renal disease: Secondary | ICD-10-CM | POA: Diagnosis not present

## 2017-07-16 DIAGNOSIS — E1122 Type 2 diabetes mellitus with diabetic chronic kidney disease: Secondary | ICD-10-CM | POA: Diagnosis not present

## 2017-07-16 DIAGNOSIS — J189 Pneumonia, unspecified organism: Secondary | ICD-10-CM | POA: Diagnosis not present

## 2017-07-16 DIAGNOSIS — I959 Hypotension, unspecified: Secondary | ICD-10-CM | POA: Diagnosis not present

## 2017-07-16 DIAGNOSIS — I2510 Atherosclerotic heart disease of native coronary artery without angina pectoris: Secondary | ICD-10-CM | POA: Diagnosis not present

## 2017-07-16 DIAGNOSIS — N186 End stage renal disease: Secondary | ICD-10-CM | POA: Diagnosis not present

## 2017-07-16 DIAGNOSIS — I1311 Hypertensive heart and chronic kidney disease without heart failure, with stage 5 chronic kidney disease, or end stage renal disease: Secondary | ICD-10-CM | POA: Diagnosis not present

## 2017-07-17 DIAGNOSIS — D631 Anemia in chronic kidney disease: Secondary | ICD-10-CM | POA: Diagnosis not present

## 2017-07-17 DIAGNOSIS — N2581 Secondary hyperparathyroidism of renal origin: Secondary | ICD-10-CM | POA: Diagnosis not present

## 2017-07-17 DIAGNOSIS — N186 End stage renal disease: Secondary | ICD-10-CM | POA: Diagnosis not present

## 2017-07-18 DIAGNOSIS — E1122 Type 2 diabetes mellitus with diabetic chronic kidney disease: Secondary | ICD-10-CM | POA: Diagnosis not present

## 2017-07-18 DIAGNOSIS — I959 Hypotension, unspecified: Secondary | ICD-10-CM | POA: Diagnosis not present

## 2017-07-18 DIAGNOSIS — I2510 Atherosclerotic heart disease of native coronary artery without angina pectoris: Secondary | ICD-10-CM | POA: Diagnosis not present

## 2017-07-18 DIAGNOSIS — N186 End stage renal disease: Secondary | ICD-10-CM | POA: Diagnosis not present

## 2017-07-18 DIAGNOSIS — J189 Pneumonia, unspecified organism: Secondary | ICD-10-CM | POA: Diagnosis not present

## 2017-07-18 DIAGNOSIS — I1311 Hypertensive heart and chronic kidney disease without heart failure, with stage 5 chronic kidney disease, or end stage renal disease: Secondary | ICD-10-CM | POA: Diagnosis not present

## 2017-07-19 DIAGNOSIS — N186 End stage renal disease: Secondary | ICD-10-CM | POA: Diagnosis not present

## 2017-07-19 DIAGNOSIS — N2581 Secondary hyperparathyroidism of renal origin: Secondary | ICD-10-CM | POA: Diagnosis not present

## 2017-07-19 DIAGNOSIS — D631 Anemia in chronic kidney disease: Secondary | ICD-10-CM | POA: Diagnosis not present

## 2017-07-21 DIAGNOSIS — J189 Pneumonia, unspecified organism: Secondary | ICD-10-CM | POA: Diagnosis not present

## 2017-07-21 DIAGNOSIS — I2510 Atherosclerotic heart disease of native coronary artery without angina pectoris: Secondary | ICD-10-CM | POA: Diagnosis not present

## 2017-07-21 DIAGNOSIS — E1122 Type 2 diabetes mellitus with diabetic chronic kidney disease: Secondary | ICD-10-CM | POA: Diagnosis not present

## 2017-07-21 DIAGNOSIS — N186 End stage renal disease: Secondary | ICD-10-CM | POA: Diagnosis not present

## 2017-07-21 DIAGNOSIS — I1311 Hypertensive heart and chronic kidney disease without heart failure, with stage 5 chronic kidney disease, or end stage renal disease: Secondary | ICD-10-CM | POA: Diagnosis not present

## 2017-07-21 DIAGNOSIS — I959 Hypotension, unspecified: Secondary | ICD-10-CM | POA: Diagnosis not present

## 2017-07-22 DIAGNOSIS — N186 End stage renal disease: Secondary | ICD-10-CM | POA: Diagnosis not present

## 2017-07-22 DIAGNOSIS — N2581 Secondary hyperparathyroidism of renal origin: Secondary | ICD-10-CM | POA: Diagnosis not present

## 2017-07-22 DIAGNOSIS — D631 Anemia in chronic kidney disease: Secondary | ICD-10-CM | POA: Diagnosis not present

## 2017-07-23 DIAGNOSIS — Z992 Dependence on renal dialysis: Secondary | ICD-10-CM | POA: Diagnosis not present

## 2017-07-23 DIAGNOSIS — Z1159 Encounter for screening for other viral diseases: Secondary | ICD-10-CM | POA: Diagnosis not present

## 2017-07-23 DIAGNOSIS — I2510 Atherosclerotic heart disease of native coronary artery without angina pectoris: Secondary | ICD-10-CM | POA: Diagnosis not present

## 2017-07-23 DIAGNOSIS — Z1389 Encounter for screening for other disorder: Secondary | ICD-10-CM | POA: Diagnosis not present

## 2017-07-23 DIAGNOSIS — I1311 Hypertensive heart and chronic kidney disease without heart failure, with stage 5 chronic kidney disease, or end stage renal disease: Secondary | ICD-10-CM | POA: Diagnosis not present

## 2017-07-23 DIAGNOSIS — I959 Hypotension, unspecified: Secondary | ICD-10-CM | POA: Diagnosis not present

## 2017-07-23 DIAGNOSIS — Z1211 Encounter for screening for malignant neoplasm of colon: Secondary | ICD-10-CM | POA: Diagnosis not present

## 2017-07-23 DIAGNOSIS — J189 Pneumonia, unspecified organism: Secondary | ICD-10-CM | POA: Diagnosis not present

## 2017-07-23 DIAGNOSIS — E1122 Type 2 diabetes mellitus with diabetic chronic kidney disease: Secondary | ICD-10-CM | POA: Diagnosis not present

## 2017-07-23 DIAGNOSIS — E119 Type 2 diabetes mellitus without complications: Secondary | ICD-10-CM | POA: Diagnosis not present

## 2017-07-23 DIAGNOSIS — N186 End stage renal disease: Secondary | ICD-10-CM | POA: Diagnosis not present

## 2017-07-24 DIAGNOSIS — N186 End stage renal disease: Secondary | ICD-10-CM | POA: Diagnosis not present

## 2017-07-24 DIAGNOSIS — N2581 Secondary hyperparathyroidism of renal origin: Secondary | ICD-10-CM | POA: Diagnosis not present

## 2017-07-24 DIAGNOSIS — D631 Anemia in chronic kidney disease: Secondary | ICD-10-CM | POA: Diagnosis not present

## 2017-07-26 DIAGNOSIS — N186 End stage renal disease: Secondary | ICD-10-CM | POA: Diagnosis not present

## 2017-07-26 DIAGNOSIS — D631 Anemia in chronic kidney disease: Secondary | ICD-10-CM | POA: Diagnosis not present

## 2017-07-26 DIAGNOSIS — N2581 Secondary hyperparathyroidism of renal origin: Secondary | ICD-10-CM | POA: Diagnosis not present

## 2017-07-29 DIAGNOSIS — N2581 Secondary hyperparathyroidism of renal origin: Secondary | ICD-10-CM | POA: Diagnosis not present

## 2017-07-29 DIAGNOSIS — N186 End stage renal disease: Secondary | ICD-10-CM | POA: Diagnosis not present

## 2017-07-29 DIAGNOSIS — D631 Anemia in chronic kidney disease: Secondary | ICD-10-CM | POA: Diagnosis not present

## 2017-07-30 DIAGNOSIS — E1122 Type 2 diabetes mellitus with diabetic chronic kidney disease: Secondary | ICD-10-CM | POA: Diagnosis not present

## 2017-07-30 DIAGNOSIS — N186 End stage renal disease: Secondary | ICD-10-CM | POA: Diagnosis not present

## 2017-07-30 DIAGNOSIS — J189 Pneumonia, unspecified organism: Secondary | ICD-10-CM | POA: Diagnosis not present

## 2017-07-30 DIAGNOSIS — I959 Hypotension, unspecified: Secondary | ICD-10-CM | POA: Diagnosis not present

## 2017-07-30 DIAGNOSIS — I2510 Atherosclerotic heart disease of native coronary artery without angina pectoris: Secondary | ICD-10-CM | POA: Diagnosis not present

## 2017-07-30 DIAGNOSIS — I1311 Hypertensive heart and chronic kidney disease without heart failure, with stage 5 chronic kidney disease, or end stage renal disease: Secondary | ICD-10-CM | POA: Diagnosis not present

## 2017-07-31 DIAGNOSIS — D631 Anemia in chronic kidney disease: Secondary | ICD-10-CM | POA: Diagnosis not present

## 2017-07-31 DIAGNOSIS — S72002A Fracture of unspecified part of neck of left femur, initial encounter for closed fracture: Secondary | ICD-10-CM | POA: Diagnosis not present

## 2017-07-31 DIAGNOSIS — N2581 Secondary hyperparathyroidism of renal origin: Secondary | ICD-10-CM | POA: Diagnosis not present

## 2017-07-31 DIAGNOSIS — N186 End stage renal disease: Secondary | ICD-10-CM | POA: Diagnosis not present

## 2017-08-01 DIAGNOSIS — I1311 Hypertensive heart and chronic kidney disease without heart failure, with stage 5 chronic kidney disease, or end stage renal disease: Secondary | ICD-10-CM | POA: Diagnosis not present

## 2017-08-01 DIAGNOSIS — E1122 Type 2 diabetes mellitus with diabetic chronic kidney disease: Secondary | ICD-10-CM | POA: Diagnosis not present

## 2017-08-01 DIAGNOSIS — J189 Pneumonia, unspecified organism: Secondary | ICD-10-CM | POA: Diagnosis not present

## 2017-08-01 DIAGNOSIS — N186 End stage renal disease: Secondary | ICD-10-CM | POA: Diagnosis not present

## 2017-08-01 DIAGNOSIS — I2510 Atherosclerotic heart disease of native coronary artery without angina pectoris: Secondary | ICD-10-CM | POA: Diagnosis not present

## 2017-08-01 DIAGNOSIS — I959 Hypotension, unspecified: Secondary | ICD-10-CM | POA: Diagnosis not present

## 2017-08-02 DIAGNOSIS — N2581 Secondary hyperparathyroidism of renal origin: Secondary | ICD-10-CM | POA: Diagnosis not present

## 2017-08-02 DIAGNOSIS — D631 Anemia in chronic kidney disease: Secondary | ICD-10-CM | POA: Diagnosis not present

## 2017-08-02 DIAGNOSIS — N186 End stage renal disease: Secondary | ICD-10-CM | POA: Diagnosis not present

## 2017-08-03 DIAGNOSIS — E1122 Type 2 diabetes mellitus with diabetic chronic kidney disease: Secondary | ICD-10-CM | POA: Diagnosis not present

## 2017-08-03 DIAGNOSIS — Z992 Dependence on renal dialysis: Secondary | ICD-10-CM | POA: Diagnosis not present

## 2017-08-03 DIAGNOSIS — N186 End stage renal disease: Secondary | ICD-10-CM | POA: Diagnosis not present

## 2017-08-04 DIAGNOSIS — I959 Hypotension, unspecified: Secondary | ICD-10-CM | POA: Diagnosis not present

## 2017-08-04 DIAGNOSIS — J189 Pneumonia, unspecified organism: Secondary | ICD-10-CM | POA: Diagnosis not present

## 2017-08-04 DIAGNOSIS — E1122 Type 2 diabetes mellitus with diabetic chronic kidney disease: Secondary | ICD-10-CM | POA: Diagnosis not present

## 2017-08-04 DIAGNOSIS — I1311 Hypertensive heart and chronic kidney disease without heart failure, with stage 5 chronic kidney disease, or end stage renal disease: Secondary | ICD-10-CM | POA: Diagnosis not present

## 2017-08-04 DIAGNOSIS — I2510 Atherosclerotic heart disease of native coronary artery without angina pectoris: Secondary | ICD-10-CM | POA: Diagnosis not present

## 2017-08-04 DIAGNOSIS — N186 End stage renal disease: Secondary | ICD-10-CM | POA: Diagnosis not present

## 2017-08-05 DIAGNOSIS — D631 Anemia in chronic kidney disease: Secondary | ICD-10-CM | POA: Diagnosis not present

## 2017-08-05 DIAGNOSIS — J302 Other seasonal allergic rhinitis: Secondary | ICD-10-CM | POA: Diagnosis not present

## 2017-08-05 DIAGNOSIS — N2581 Secondary hyperparathyroidism of renal origin: Secondary | ICD-10-CM | POA: Diagnosis not present

## 2017-08-05 DIAGNOSIS — N186 End stage renal disease: Secondary | ICD-10-CM | POA: Diagnosis not present

## 2017-08-05 DIAGNOSIS — R7309 Other abnormal glucose: Secondary | ICD-10-CM | POA: Diagnosis not present

## 2017-08-06 DIAGNOSIS — N186 End stage renal disease: Secondary | ICD-10-CM | POA: Diagnosis not present

## 2017-08-06 DIAGNOSIS — I959 Hypotension, unspecified: Secondary | ICD-10-CM | POA: Diagnosis not present

## 2017-08-06 DIAGNOSIS — J189 Pneumonia, unspecified organism: Secondary | ICD-10-CM | POA: Diagnosis not present

## 2017-08-06 DIAGNOSIS — I2510 Atherosclerotic heart disease of native coronary artery without angina pectoris: Secondary | ICD-10-CM | POA: Diagnosis not present

## 2017-08-06 DIAGNOSIS — I1311 Hypertensive heart and chronic kidney disease without heart failure, with stage 5 chronic kidney disease, or end stage renal disease: Secondary | ICD-10-CM | POA: Diagnosis not present

## 2017-08-06 DIAGNOSIS — E1122 Type 2 diabetes mellitus with diabetic chronic kidney disease: Secondary | ICD-10-CM | POA: Diagnosis not present

## 2017-08-07 DIAGNOSIS — D631 Anemia in chronic kidney disease: Secondary | ICD-10-CM | POA: Diagnosis not present

## 2017-08-07 DIAGNOSIS — N186 End stage renal disease: Secondary | ICD-10-CM | POA: Diagnosis not present

## 2017-08-07 DIAGNOSIS — N2581 Secondary hyperparathyroidism of renal origin: Secondary | ICD-10-CM | POA: Diagnosis not present

## 2017-08-09 DIAGNOSIS — N2581 Secondary hyperparathyroidism of renal origin: Secondary | ICD-10-CM | POA: Diagnosis not present

## 2017-08-09 DIAGNOSIS — N186 End stage renal disease: Secondary | ICD-10-CM | POA: Diagnosis not present

## 2017-08-09 DIAGNOSIS — D631 Anemia in chronic kidney disease: Secondary | ICD-10-CM | POA: Diagnosis not present

## 2017-08-10 DIAGNOSIS — R0789 Other chest pain: Secondary | ICD-10-CM | POA: Diagnosis not present

## 2017-08-10 DIAGNOSIS — S20212A Contusion of left front wall of thorax, initial encounter: Secondary | ICD-10-CM | POA: Diagnosis not present

## 2017-08-10 DIAGNOSIS — R0781 Pleurodynia: Secondary | ICD-10-CM | POA: Diagnosis not present

## 2017-08-10 DIAGNOSIS — M1990 Unspecified osteoarthritis, unspecified site: Secondary | ICD-10-CM | POA: Diagnosis not present

## 2017-08-10 DIAGNOSIS — I2510 Atherosclerotic heart disease of native coronary artery without angina pectoris: Secondary | ICD-10-CM | POA: Diagnosis not present

## 2017-08-10 DIAGNOSIS — K219 Gastro-esophageal reflux disease without esophagitis: Secondary | ICD-10-CM | POA: Diagnosis not present

## 2017-08-10 DIAGNOSIS — E119 Type 2 diabetes mellitus without complications: Secondary | ICD-10-CM | POA: Diagnosis not present

## 2017-08-10 DIAGNOSIS — I10 Essential (primary) hypertension: Secondary | ICD-10-CM | POA: Diagnosis not present

## 2017-08-12 DIAGNOSIS — N186 End stage renal disease: Secondary | ICD-10-CM | POA: Diagnosis not present

## 2017-08-12 DIAGNOSIS — N2581 Secondary hyperparathyroidism of renal origin: Secondary | ICD-10-CM | POA: Diagnosis not present

## 2017-08-12 DIAGNOSIS — D631 Anemia in chronic kidney disease: Secondary | ICD-10-CM | POA: Diagnosis not present

## 2017-08-14 DIAGNOSIS — N186 End stage renal disease: Secondary | ICD-10-CM | POA: Diagnosis not present

## 2017-08-14 DIAGNOSIS — N2581 Secondary hyperparathyroidism of renal origin: Secondary | ICD-10-CM | POA: Diagnosis not present

## 2017-08-14 DIAGNOSIS — D631 Anemia in chronic kidney disease: Secondary | ICD-10-CM | POA: Diagnosis not present

## 2017-08-16 DIAGNOSIS — N2581 Secondary hyperparathyroidism of renal origin: Secondary | ICD-10-CM | POA: Diagnosis not present

## 2017-08-16 DIAGNOSIS — N186 End stage renal disease: Secondary | ICD-10-CM | POA: Diagnosis not present

## 2017-08-16 DIAGNOSIS — D631 Anemia in chronic kidney disease: Secondary | ICD-10-CM | POA: Diagnosis not present

## 2017-08-19 DIAGNOSIS — D631 Anemia in chronic kidney disease: Secondary | ICD-10-CM | POA: Diagnosis not present

## 2017-08-19 DIAGNOSIS — N2581 Secondary hyperparathyroidism of renal origin: Secondary | ICD-10-CM | POA: Diagnosis not present

## 2017-08-19 DIAGNOSIS — N186 End stage renal disease: Secondary | ICD-10-CM | POA: Diagnosis not present

## 2017-08-21 DIAGNOSIS — N2581 Secondary hyperparathyroidism of renal origin: Secondary | ICD-10-CM | POA: Diagnosis not present

## 2017-08-21 DIAGNOSIS — N186 End stage renal disease: Secondary | ICD-10-CM | POA: Diagnosis not present

## 2017-08-21 DIAGNOSIS — D631 Anemia in chronic kidney disease: Secondary | ICD-10-CM | POA: Diagnosis not present

## 2017-08-23 DIAGNOSIS — N186 End stage renal disease: Secondary | ICD-10-CM | POA: Diagnosis not present

## 2017-08-23 DIAGNOSIS — N2581 Secondary hyperparathyroidism of renal origin: Secondary | ICD-10-CM | POA: Diagnosis not present

## 2017-08-23 DIAGNOSIS — D631 Anemia in chronic kidney disease: Secondary | ICD-10-CM | POA: Diagnosis not present

## 2017-08-26 DIAGNOSIS — D631 Anemia in chronic kidney disease: Secondary | ICD-10-CM | POA: Diagnosis not present

## 2017-08-26 DIAGNOSIS — N186 End stage renal disease: Secondary | ICD-10-CM | POA: Diagnosis not present

## 2017-08-26 DIAGNOSIS — N2581 Secondary hyperparathyroidism of renal origin: Secondary | ICD-10-CM | POA: Diagnosis not present

## 2017-08-28 DIAGNOSIS — D631 Anemia in chronic kidney disease: Secondary | ICD-10-CM | POA: Diagnosis not present

## 2017-08-28 DIAGNOSIS — N2581 Secondary hyperparathyroidism of renal origin: Secondary | ICD-10-CM | POA: Diagnosis not present

## 2017-08-28 DIAGNOSIS — N186 End stage renal disease: Secondary | ICD-10-CM | POA: Diagnosis not present

## 2017-08-30 DIAGNOSIS — N2581 Secondary hyperparathyroidism of renal origin: Secondary | ICD-10-CM | POA: Diagnosis not present

## 2017-08-30 DIAGNOSIS — N186 End stage renal disease: Secondary | ICD-10-CM | POA: Diagnosis not present

## 2017-08-30 DIAGNOSIS — D631 Anemia in chronic kidney disease: Secondary | ICD-10-CM | POA: Diagnosis not present

## 2017-09-01 DIAGNOSIS — I10 Essential (primary) hypertension: Secondary | ICD-10-CM | POA: Diagnosis not present

## 2017-09-01 DIAGNOSIS — R7309 Other abnormal glucose: Secondary | ICD-10-CM | POA: Diagnosis not present

## 2017-09-02 DIAGNOSIS — Z992 Dependence on renal dialysis: Secondary | ICD-10-CM | POA: Diagnosis not present

## 2017-09-02 DIAGNOSIS — E1122 Type 2 diabetes mellitus with diabetic chronic kidney disease: Secondary | ICD-10-CM | POA: Diagnosis not present

## 2017-09-02 DIAGNOSIS — N2581 Secondary hyperparathyroidism of renal origin: Secondary | ICD-10-CM | POA: Diagnosis not present

## 2017-09-02 DIAGNOSIS — N186 End stage renal disease: Secondary | ICD-10-CM | POA: Diagnosis not present

## 2017-09-02 DIAGNOSIS — D631 Anemia in chronic kidney disease: Secondary | ICD-10-CM | POA: Diagnosis not present

## 2017-09-04 DIAGNOSIS — D631 Anemia in chronic kidney disease: Secondary | ICD-10-CM | POA: Diagnosis not present

## 2017-09-04 DIAGNOSIS — N186 End stage renal disease: Secondary | ICD-10-CM | POA: Diagnosis not present

## 2017-09-04 DIAGNOSIS — N2581 Secondary hyperparathyroidism of renal origin: Secondary | ICD-10-CM | POA: Diagnosis not present

## 2017-09-06 DIAGNOSIS — D631 Anemia in chronic kidney disease: Secondary | ICD-10-CM | POA: Diagnosis not present

## 2017-09-06 DIAGNOSIS — N186 End stage renal disease: Secondary | ICD-10-CM | POA: Diagnosis not present

## 2017-09-06 DIAGNOSIS — N2581 Secondary hyperparathyroidism of renal origin: Secondary | ICD-10-CM | POA: Diagnosis not present

## 2017-09-09 DIAGNOSIS — N186 End stage renal disease: Secondary | ICD-10-CM | POA: Diagnosis not present

## 2017-09-09 DIAGNOSIS — N2581 Secondary hyperparathyroidism of renal origin: Secondary | ICD-10-CM | POA: Diagnosis not present

## 2017-09-09 DIAGNOSIS — D631 Anemia in chronic kidney disease: Secondary | ICD-10-CM | POA: Diagnosis not present

## 2017-09-11 DIAGNOSIS — D631 Anemia in chronic kidney disease: Secondary | ICD-10-CM | POA: Diagnosis not present

## 2017-09-11 DIAGNOSIS — N186 End stage renal disease: Secondary | ICD-10-CM | POA: Diagnosis not present

## 2017-09-11 DIAGNOSIS — N2581 Secondary hyperparathyroidism of renal origin: Secondary | ICD-10-CM | POA: Diagnosis not present

## 2017-09-13 DIAGNOSIS — N186 End stage renal disease: Secondary | ICD-10-CM | POA: Diagnosis not present

## 2017-09-13 DIAGNOSIS — N2581 Secondary hyperparathyroidism of renal origin: Secondary | ICD-10-CM | POA: Diagnosis not present

## 2017-09-13 DIAGNOSIS — D631 Anemia in chronic kidney disease: Secondary | ICD-10-CM | POA: Diagnosis not present

## 2017-09-16 DIAGNOSIS — D631 Anemia in chronic kidney disease: Secondary | ICD-10-CM | POA: Diagnosis not present

## 2017-09-16 DIAGNOSIS — N186 End stage renal disease: Secondary | ICD-10-CM | POA: Diagnosis not present

## 2017-09-16 DIAGNOSIS — N2581 Secondary hyperparathyroidism of renal origin: Secondary | ICD-10-CM | POA: Diagnosis not present

## 2017-09-18 DIAGNOSIS — N186 End stage renal disease: Secondary | ICD-10-CM | POA: Diagnosis not present

## 2017-09-18 DIAGNOSIS — N2581 Secondary hyperparathyroidism of renal origin: Secondary | ICD-10-CM | POA: Diagnosis not present

## 2017-09-18 DIAGNOSIS — D631 Anemia in chronic kidney disease: Secondary | ICD-10-CM | POA: Diagnosis not present

## 2017-09-20 DIAGNOSIS — D631 Anemia in chronic kidney disease: Secondary | ICD-10-CM | POA: Diagnosis not present

## 2017-09-20 DIAGNOSIS — N2581 Secondary hyperparathyroidism of renal origin: Secondary | ICD-10-CM | POA: Diagnosis not present

## 2017-09-20 DIAGNOSIS — N186 End stage renal disease: Secondary | ICD-10-CM | POA: Diagnosis not present

## 2017-09-23 DIAGNOSIS — N186 End stage renal disease: Secondary | ICD-10-CM | POA: Diagnosis not present

## 2017-09-23 DIAGNOSIS — N2581 Secondary hyperparathyroidism of renal origin: Secondary | ICD-10-CM | POA: Diagnosis not present

## 2017-09-23 DIAGNOSIS — D631 Anemia in chronic kidney disease: Secondary | ICD-10-CM | POA: Diagnosis not present

## 2017-09-25 DIAGNOSIS — N2581 Secondary hyperparathyroidism of renal origin: Secondary | ICD-10-CM | POA: Diagnosis not present

## 2017-09-25 DIAGNOSIS — N186 End stage renal disease: Secondary | ICD-10-CM | POA: Diagnosis not present

## 2017-09-25 DIAGNOSIS — D631 Anemia in chronic kidney disease: Secondary | ICD-10-CM | POA: Diagnosis not present

## 2017-09-27 DIAGNOSIS — N2581 Secondary hyperparathyroidism of renal origin: Secondary | ICD-10-CM | POA: Diagnosis not present

## 2017-09-27 DIAGNOSIS — N186 End stage renal disease: Secondary | ICD-10-CM | POA: Diagnosis not present

## 2017-09-27 DIAGNOSIS — D631 Anemia in chronic kidney disease: Secondary | ICD-10-CM | POA: Diagnosis not present

## 2017-09-30 DIAGNOSIS — N186 End stage renal disease: Secondary | ICD-10-CM | POA: Diagnosis not present

## 2017-09-30 DIAGNOSIS — D631 Anemia in chronic kidney disease: Secondary | ICD-10-CM | POA: Diagnosis not present

## 2017-09-30 DIAGNOSIS — N2581 Secondary hyperparathyroidism of renal origin: Secondary | ICD-10-CM | POA: Diagnosis not present

## 2017-10-02 DIAGNOSIS — N186 End stage renal disease: Secondary | ICD-10-CM | POA: Diagnosis not present

## 2017-10-02 DIAGNOSIS — D631 Anemia in chronic kidney disease: Secondary | ICD-10-CM | POA: Diagnosis not present

## 2017-10-02 DIAGNOSIS — N2581 Secondary hyperparathyroidism of renal origin: Secondary | ICD-10-CM | POA: Diagnosis not present

## 2017-10-03 DIAGNOSIS — N186 End stage renal disease: Secondary | ICD-10-CM | POA: Diagnosis not present

## 2017-10-03 DIAGNOSIS — Z992 Dependence on renal dialysis: Secondary | ICD-10-CM | POA: Diagnosis not present

## 2017-10-03 DIAGNOSIS — E1122 Type 2 diabetes mellitus with diabetic chronic kidney disease: Secondary | ICD-10-CM | POA: Diagnosis not present

## 2017-10-04 DIAGNOSIS — N186 End stage renal disease: Secondary | ICD-10-CM | POA: Diagnosis not present

## 2017-10-04 DIAGNOSIS — N2581 Secondary hyperparathyroidism of renal origin: Secondary | ICD-10-CM | POA: Diagnosis not present

## 2017-10-07 DIAGNOSIS — N186 End stage renal disease: Secondary | ICD-10-CM | POA: Diagnosis not present

## 2017-10-07 DIAGNOSIS — N2581 Secondary hyperparathyroidism of renal origin: Secondary | ICD-10-CM | POA: Diagnosis not present

## 2017-10-09 DIAGNOSIS — N2581 Secondary hyperparathyroidism of renal origin: Secondary | ICD-10-CM | POA: Diagnosis not present

## 2017-10-09 DIAGNOSIS — N186 End stage renal disease: Secondary | ICD-10-CM | POA: Diagnosis not present

## 2017-10-11 DIAGNOSIS — N2581 Secondary hyperparathyroidism of renal origin: Secondary | ICD-10-CM | POA: Diagnosis not present

## 2017-10-11 DIAGNOSIS — N186 End stage renal disease: Secondary | ICD-10-CM | POA: Diagnosis not present

## 2017-10-14 DIAGNOSIS — N2581 Secondary hyperparathyroidism of renal origin: Secondary | ICD-10-CM | POA: Diagnosis not present

## 2017-10-14 DIAGNOSIS — N186 End stage renal disease: Secondary | ICD-10-CM | POA: Diagnosis not present

## 2017-10-16 DIAGNOSIS — N2581 Secondary hyperparathyroidism of renal origin: Secondary | ICD-10-CM | POA: Diagnosis not present

## 2017-10-16 DIAGNOSIS — N186 End stage renal disease: Secondary | ICD-10-CM | POA: Diagnosis not present

## 2017-10-18 DIAGNOSIS — N2581 Secondary hyperparathyroidism of renal origin: Secondary | ICD-10-CM | POA: Diagnosis not present

## 2017-10-18 DIAGNOSIS — N186 End stage renal disease: Secondary | ICD-10-CM | POA: Diagnosis not present

## 2017-10-21 DIAGNOSIS — N2581 Secondary hyperparathyroidism of renal origin: Secondary | ICD-10-CM | POA: Diagnosis not present

## 2017-10-21 DIAGNOSIS — N186 End stage renal disease: Secondary | ICD-10-CM | POA: Diagnosis not present

## 2017-10-23 DIAGNOSIS — N186 End stage renal disease: Secondary | ICD-10-CM | POA: Diagnosis not present

## 2017-10-23 DIAGNOSIS — N2581 Secondary hyperparathyroidism of renal origin: Secondary | ICD-10-CM | POA: Diagnosis not present

## 2017-10-25 DIAGNOSIS — N186 End stage renal disease: Secondary | ICD-10-CM | POA: Diagnosis not present

## 2017-10-25 DIAGNOSIS — N2581 Secondary hyperparathyroidism of renal origin: Secondary | ICD-10-CM | POA: Diagnosis not present

## 2017-10-28 DIAGNOSIS — N2581 Secondary hyperparathyroidism of renal origin: Secondary | ICD-10-CM | POA: Diagnosis not present

## 2017-10-28 DIAGNOSIS — N186 End stage renal disease: Secondary | ICD-10-CM | POA: Diagnosis not present

## 2017-10-30 DIAGNOSIS — N2581 Secondary hyperparathyroidism of renal origin: Secondary | ICD-10-CM | POA: Diagnosis not present

## 2017-10-30 DIAGNOSIS — N186 End stage renal disease: Secondary | ICD-10-CM | POA: Diagnosis not present

## 2017-11-01 DIAGNOSIS — N2581 Secondary hyperparathyroidism of renal origin: Secondary | ICD-10-CM | POA: Diagnosis not present

## 2017-11-01 DIAGNOSIS — N186 End stage renal disease: Secondary | ICD-10-CM | POA: Diagnosis not present

## 2017-11-02 DIAGNOSIS — N186 End stage renal disease: Secondary | ICD-10-CM | POA: Diagnosis not present

## 2017-11-02 DIAGNOSIS — Z992 Dependence on renal dialysis: Secondary | ICD-10-CM | POA: Diagnosis not present

## 2017-11-02 DIAGNOSIS — E1122 Type 2 diabetes mellitus with diabetic chronic kidney disease: Secondary | ICD-10-CM | POA: Diagnosis not present

## 2017-11-04 DIAGNOSIS — N186 End stage renal disease: Secondary | ICD-10-CM | POA: Diagnosis not present

## 2017-11-04 DIAGNOSIS — N2581 Secondary hyperparathyroidism of renal origin: Secondary | ICD-10-CM | POA: Diagnosis not present

## 2017-11-05 DIAGNOSIS — H40033 Anatomical narrow angle, bilateral: Secondary | ICD-10-CM | POA: Diagnosis not present

## 2017-11-06 DIAGNOSIS — N2581 Secondary hyperparathyroidism of renal origin: Secondary | ICD-10-CM | POA: Diagnosis not present

## 2017-11-06 DIAGNOSIS — N186 End stage renal disease: Secondary | ICD-10-CM | POA: Diagnosis not present

## 2017-11-08 DIAGNOSIS — N186 End stage renal disease: Secondary | ICD-10-CM | POA: Diagnosis not present

## 2017-11-08 DIAGNOSIS — N2581 Secondary hyperparathyroidism of renal origin: Secondary | ICD-10-CM | POA: Diagnosis not present

## 2017-11-11 DIAGNOSIS — N2581 Secondary hyperparathyroidism of renal origin: Secondary | ICD-10-CM | POA: Diagnosis not present

## 2017-11-11 DIAGNOSIS — N186 End stage renal disease: Secondary | ICD-10-CM | POA: Diagnosis not present

## 2017-11-12 DIAGNOSIS — Z992 Dependence on renal dialysis: Secondary | ICD-10-CM | POA: Diagnosis not present

## 2017-11-12 DIAGNOSIS — E1322 Other specified diabetes mellitus with diabetic chronic kidney disease: Secondary | ICD-10-CM | POA: Diagnosis not present

## 2017-11-12 DIAGNOSIS — E669 Obesity, unspecified: Secondary | ICD-10-CM | POA: Diagnosis not present

## 2017-11-12 DIAGNOSIS — E1129 Type 2 diabetes mellitus with other diabetic kidney complication: Secondary | ICD-10-CM | POA: Insufficient documentation

## 2017-11-12 DIAGNOSIS — G5601 Carpal tunnel syndrome, right upper limb: Secondary | ICD-10-CM | POA: Diagnosis not present

## 2017-11-12 DIAGNOSIS — R768 Other specified abnormal immunological findings in serum: Secondary | ICD-10-CM | POA: Insufficient documentation

## 2017-11-12 DIAGNOSIS — N186 End stage renal disease: Secondary | ICD-10-CM | POA: Diagnosis not present

## 2017-11-12 DIAGNOSIS — Z794 Long term (current) use of insulin: Secondary | ICD-10-CM | POA: Diagnosis not present

## 2017-11-13 DIAGNOSIS — N186 End stage renal disease: Secondary | ICD-10-CM | POA: Diagnosis not present

## 2017-11-13 DIAGNOSIS — N2581 Secondary hyperparathyroidism of renal origin: Secondary | ICD-10-CM | POA: Diagnosis not present

## 2017-11-15 DIAGNOSIS — N2581 Secondary hyperparathyroidism of renal origin: Secondary | ICD-10-CM | POA: Diagnosis not present

## 2017-11-15 DIAGNOSIS — N186 End stage renal disease: Secondary | ICD-10-CM | POA: Diagnosis not present

## 2017-11-18 DIAGNOSIS — N186 End stage renal disease: Secondary | ICD-10-CM | POA: Diagnosis not present

## 2017-11-18 DIAGNOSIS — N2581 Secondary hyperparathyroidism of renal origin: Secondary | ICD-10-CM | POA: Diagnosis not present

## 2017-11-19 ENCOUNTER — Encounter (INDEPENDENT_AMBULATORY_CARE_PROVIDER_SITE_OTHER): Payer: Medicare Other

## 2017-11-19 ENCOUNTER — Ambulatory Visit (INDEPENDENT_AMBULATORY_CARE_PROVIDER_SITE_OTHER): Payer: Medicare Other | Admitting: Vascular Surgery

## 2017-11-20 DIAGNOSIS — N186 End stage renal disease: Secondary | ICD-10-CM | POA: Diagnosis not present

## 2017-11-20 DIAGNOSIS — N2581 Secondary hyperparathyroidism of renal origin: Secondary | ICD-10-CM | POA: Diagnosis not present

## 2017-11-22 DIAGNOSIS — N2581 Secondary hyperparathyroidism of renal origin: Secondary | ICD-10-CM | POA: Diagnosis not present

## 2017-11-22 DIAGNOSIS — N186 End stage renal disease: Secondary | ICD-10-CM | POA: Diagnosis not present

## 2017-11-25 DIAGNOSIS — N186 End stage renal disease: Secondary | ICD-10-CM | POA: Diagnosis not present

## 2017-11-25 DIAGNOSIS — N2581 Secondary hyperparathyroidism of renal origin: Secondary | ICD-10-CM | POA: Diagnosis not present

## 2017-11-27 DIAGNOSIS — N2581 Secondary hyperparathyroidism of renal origin: Secondary | ICD-10-CM | POA: Diagnosis not present

## 2017-11-27 DIAGNOSIS — N186 End stage renal disease: Secondary | ICD-10-CM | POA: Diagnosis not present

## 2017-11-29 DIAGNOSIS — N186 End stage renal disease: Secondary | ICD-10-CM | POA: Diagnosis not present

## 2017-11-29 DIAGNOSIS — N2581 Secondary hyperparathyroidism of renal origin: Secondary | ICD-10-CM | POA: Diagnosis not present

## 2017-12-02 DIAGNOSIS — N186 End stage renal disease: Secondary | ICD-10-CM | POA: Diagnosis not present

## 2017-12-02 DIAGNOSIS — N2581 Secondary hyperparathyroidism of renal origin: Secondary | ICD-10-CM | POA: Diagnosis not present

## 2017-12-03 DIAGNOSIS — N186 End stage renal disease: Secondary | ICD-10-CM | POA: Diagnosis not present

## 2017-12-03 DIAGNOSIS — Z992 Dependence on renal dialysis: Secondary | ICD-10-CM | POA: Diagnosis not present

## 2017-12-03 DIAGNOSIS — E1122 Type 2 diabetes mellitus with diabetic chronic kidney disease: Secondary | ICD-10-CM | POA: Diagnosis not present

## 2017-12-04 DIAGNOSIS — N186 End stage renal disease: Secondary | ICD-10-CM | POA: Diagnosis not present

## 2017-12-04 DIAGNOSIS — N2581 Secondary hyperparathyroidism of renal origin: Secondary | ICD-10-CM | POA: Diagnosis not present

## 2017-12-06 DIAGNOSIS — N2581 Secondary hyperparathyroidism of renal origin: Secondary | ICD-10-CM | POA: Diagnosis not present

## 2017-12-06 DIAGNOSIS — N186 End stage renal disease: Secondary | ICD-10-CM | POA: Diagnosis not present

## 2017-12-09 DIAGNOSIS — N186 End stage renal disease: Secondary | ICD-10-CM | POA: Diagnosis not present

## 2017-12-09 DIAGNOSIS — N2581 Secondary hyperparathyroidism of renal origin: Secondary | ICD-10-CM | POA: Diagnosis not present

## 2017-12-11 DIAGNOSIS — N186 End stage renal disease: Secondary | ICD-10-CM | POA: Diagnosis not present

## 2017-12-11 DIAGNOSIS — N2581 Secondary hyperparathyroidism of renal origin: Secondary | ICD-10-CM | POA: Diagnosis not present

## 2017-12-13 DIAGNOSIS — N186 End stage renal disease: Secondary | ICD-10-CM | POA: Diagnosis not present

## 2017-12-13 DIAGNOSIS — N2581 Secondary hyperparathyroidism of renal origin: Secondary | ICD-10-CM | POA: Diagnosis not present

## 2017-12-16 DIAGNOSIS — N186 End stage renal disease: Secondary | ICD-10-CM | POA: Diagnosis not present

## 2017-12-16 DIAGNOSIS — N2581 Secondary hyperparathyroidism of renal origin: Secondary | ICD-10-CM | POA: Diagnosis not present

## 2017-12-18 DIAGNOSIS — N186 End stage renal disease: Secondary | ICD-10-CM | POA: Diagnosis not present

## 2017-12-18 DIAGNOSIS — N2581 Secondary hyperparathyroidism of renal origin: Secondary | ICD-10-CM | POA: Diagnosis not present

## 2017-12-20 DIAGNOSIS — N186 End stage renal disease: Secondary | ICD-10-CM | POA: Diagnosis not present

## 2017-12-20 DIAGNOSIS — N2581 Secondary hyperparathyroidism of renal origin: Secondary | ICD-10-CM | POA: Diagnosis not present

## 2017-12-23 DIAGNOSIS — N2581 Secondary hyperparathyroidism of renal origin: Secondary | ICD-10-CM | POA: Diagnosis not present

## 2017-12-23 DIAGNOSIS — N186 End stage renal disease: Secondary | ICD-10-CM | POA: Diagnosis not present

## 2017-12-24 ENCOUNTER — Encounter (INDEPENDENT_AMBULATORY_CARE_PROVIDER_SITE_OTHER): Payer: Self-pay | Admitting: Nurse Practitioner

## 2017-12-24 ENCOUNTER — Ambulatory Visit (INDEPENDENT_AMBULATORY_CARE_PROVIDER_SITE_OTHER): Payer: Medicare Other | Admitting: Nurse Practitioner

## 2017-12-24 ENCOUNTER — Ambulatory Visit (INDEPENDENT_AMBULATORY_CARE_PROVIDER_SITE_OTHER): Payer: Medicare Other

## 2017-12-24 ENCOUNTER — Encounter

## 2017-12-24 VITALS — BP 114/58 | HR 75 | Resp 16 | Ht 59.0 in | Wt 164.8 lb

## 2017-12-24 DIAGNOSIS — E782 Mixed hyperlipidemia: Secondary | ICD-10-CM

## 2017-12-24 DIAGNOSIS — I151 Hypertension secondary to other renal disorders: Secondary | ICD-10-CM

## 2017-12-24 DIAGNOSIS — T829XXS Unspecified complication of cardiac and vascular prosthetic device, implant and graft, sequela: Secondary | ICD-10-CM

## 2017-12-24 DIAGNOSIS — N186 End stage renal disease: Secondary | ICD-10-CM | POA: Diagnosis not present

## 2017-12-24 DIAGNOSIS — N2889 Other specified disorders of kidney and ureter: Secondary | ICD-10-CM

## 2017-12-24 NOTE — Progress Notes (Signed)
Subjective:    Patient ID: Kelli Nguyen, female    DOB: 09/06/1945, 72 y.o.   MRN: [id number] Chief Complaint  Patient presents with  . Follow-up    2month HDA    HPI  Kelli Nguyen returns to the office for followup of their [facility] dialysis access. The function of the access has been stable. The patient denies increased bleeding time or increased recirculation. Patient denies difficulty with cannulation. The patient denies hand pain or other symptoms consistent with steal phenomena.  No significant arm swelling.  The patient denies redness or swelling at the access site. The patient denies fever or chills at home or while on dialysis.  The patient denies amaurosis fugax or recent TIA symptoms. There are no recent neurological changes noted. The patient denies claudication symptoms or rest pain symptoms. The patient denies history of DVT, PE or superficial thrombophlebitis. The patient denies recent episodes of angina or shortness of breath.     Duplex ultrasound of the AV access shows a patent access with no hemodynamically significant increases in a normal flow volume.  No significant change in the maximum velocities when compared to the previous exam on 05/26/2017.  Flow volume is decreased from previous exam, however the volumes are within normal limits.    Constitutional: [] Weight loss  [] Fever  [] Chills Cardiac: [] Chest pain   [] Chest pressure   [] Palpitations   [] Shortness of breath when laying flat   [] Shortness of breath with exertion. Vascular:  [] Pain in legs with walking   [] Pain in legs with standing  [] History of DVT   [] Phlebitis   [] Swelling in legs   [] Varicose veins   [] Non-healing ulcers Pulmonary:   [] Uses home oxygen   [] Productive cough   [] Hemoptysis   [] Wheeze  [] COPD   [] Asthma Neurologic:  [] Dizziness   [] Seizures   [] History of stroke   [] History of TIA  [] Aphasia   [] Vissual changes   [] Weakness or numbness in arm   [x] Weakness or numbness in  leg Musculoskeletal:   [] Joint swelling   [] Joint pain   [] Low back pain Hematologic:  [] Easy bruising  [] Easy bleeding   [] Hypercoagulable state   [] Anemic Gastrointestinal:  [] Diarrhea   [] Vomiting  [] Gastroesophageal reflux/heartburn   [] Difficulty swallowing. Genitourinary:  [x] Chronic kidney disease   [] Difficult urination  [] Frequent urination   [] Blood in urine Skin:  [] Rashes   [] Ulcers  Psychological:  [] History of anxiety   []  History of major depression.     Objective:   Physical Exam  BP (!) 114/58 (BP Location: Right Arm)   Pulse 75   Resp 16   Ht 4\' 11"  (1.499 m)   Wt 164 lb 12.8 oz (74.8 kg)   BMI 33.29 kg/m   Past Medical History:  Diagnosis Date  . Anemia   . Chronic cough   . Chronic kidney disease   . Coronary artery disease   . Diabetes mellitus without complication ([place])   . Dialysis patient ([place])    T-TH-SAT  . GERD (gastroesophageal reflux disease)   . Hypertension   . Neuropathy    hands  . Wears dentures    partial upper and lower     Gen: WD/WN, NAD Head: [place]/AT, No temporalis wasting.  Ear/Nose/Throat: Hearing grossly intact, nares w/o erythema or drainage Eyes: PER, EOMI,   Neck: Supple, no masses.  No bruit or JVD.  Pulmonary:  Good air movement, clear to auscultation bilaterally, no use of accessory muscles.  Cardiac: RRR Vascular:  Good  thrill felt and bruit heard Vessel Right Left  Radial  palpable  palpable  Gastrointestinal: soft, non-distended. No guarding/no peritoneal signs.  Musculoskeletal:   No deformity or atrophy.  Neurologic:  Symmetrical.  Speech is fluent. Motor exam as listed above. Psychiatric: Judgment intact, Mood & affect appropriate for pt's clinical situation. Dermatologic: No Venous rashes no ulcers noted.  No changes consistent with cellulitis. Lymph : No Cervical lymphadenopathy, no lichenification or skin changes of chronic lymphedema.   Social History   Socioeconomic History  . Marital status: Widowed     Spouse name: Not on file  . Number of children: Not on file  . Years of education: Not on file  . Highest education level: Not on file  Occupational History  . Not on file  Social Needs  . Financial resource strain: Not on file  . Food insecurity:    Worry: Not on file    Inability: Not on file  . Transportation needs:    Medical: Not on file    Non-medical: Not on file  Tobacco Use  . Smoking status: Never Smoker  . Smokeless tobacco: Never Used  Substance and Sexual Activity  . Alcohol use: No  . Drug use: No  . Sexual activity: Not on file  Lifestyle  . Physical activity:    Days per week: Not on file    Minutes per session: Not on file  . Stress: Not on file  Relationships  . Social connections:    Talks on phone: Not on file    Gets together: Not on file    Attends religious service: Not on file    Active member of club or organization: Not on file    Attends meetings of clubs or organizations: Not on file    Relationship status: Not on file  . Intimate partner violence:    Fear of current or ex partner: Not on file    Emotionally abused: Not on file    Physically abused: Not on file    Forced sexual activity: Not on file  Other Topics Concern  . Not on file  Social History Narrative  . Not on file    Past Surgical History:  Procedure Laterality Date  . A/V SHUNT INTERVENTION N/A 10/16/2016   Procedure: A/V Shunt Intervention;  Surgeon: Katha Cabal, MD;  Location: [place] [facility];  Service: Cardiovascular;  Laterality: N/A;  . A/V SHUNTOGRAM Left 10/16/2016   Procedure: A/V Shuntogram;  Surgeon: Katha Cabal, MD;  Location: [place] [facility];  Service: Cardiovascular;  Laterality: Left;  . ABDOMINAL HYSTERECTOMY    . CARPAL TUNNEL RELEASE Left 09/27/2015   Procedure: CARPAL TUNNEL RELEASE;  Surgeon: Leanor Kail, MD;  Location: [facility];  Service: Orthopedics;  Laterality: Left;  . CATARACT EXTRACTION W/PHACO Right 09/23/2016    Procedure: CATARACT EXTRACTION PHACO AND INTRAOCULAR LENS PLACEMENT (IOC) complicated diabetic right;  Surgeon: Ronnell Freshwater, MD;  Location: [place];  Service: Ophthalmology;  Laterality: Right;  Diabetic - diet controlled Potassium draw before surgery  . CATARACT EXTRACTION W/PHACO Left 01/20/2017   Procedure: CATARACT EXTRACTION PHACO AND INTRAOCULAR LENS PLACEMENT (IOC) LEFT DIABETIC;  Surgeon: Eulogio Bear, MD;  Location: [place];  Service: Ophthalmology;  Laterality: Left;  Diabetic - diet controlled Potassium draw prior to procedure  8:00 ARRIVAL  . COLONOSCOPY    . COLONOSCOPY WITH PROPOFOL N/A 11/28/2014   Procedure: COLONOSCOPY WITH PROPOFOL;  Surgeon: Manya Silvas, MD;  Location:  [place] [facility];  Service: Endoscopy;  Laterality: N/A;  . CORONARY ARTERY BYPASS GRAFT    . PERIPHERAL VASCULAR CATHETERIZATION Left 01/18/2015   Procedure: A/V Shuntogram/Fistulagram;  Surgeon: Katha Cabal, MD;  Location: [place] [facility];  Service: Cardiovascular;  Laterality: Left;  . PERIPHERAL VASCULAR CATHETERIZATION Left 01/18/2015   Procedure: A/V Shunt Intervention;  Surgeon: Katha Cabal, MD;  Location: [place] [facility];  Service: Cardiovascular;  Laterality: Left;  . PERIPHERAL VASCULAR CATHETERIZATION N/A 08/10/2015   Procedure: Thrombectomy;  Surgeon: Algernon Huxley, MD;  Location: [place] [facility];  Service: Cardiovascular;  Laterality: N/A;  . PERIPHERAL VASCULAR CATHETERIZATION N/A 08/10/2015   Procedure: A/V Shuntogram/Fistulagram;  Surgeon: Algernon Huxley, MD;  Location: [place] [facility];  Service: Cardiovascular;  Laterality: N/A;  . PERIPHERAL VASCULAR CATHETERIZATION N/A 08/10/2015   Procedure: A/V Shunt Intervention;  Surgeon: Algernon Huxley, MD;  Location: [place] [facility];  Service: Cardiovascular;  Laterality: N/A;  . RENAL SHUNTS      No family history on file.  Allergies  Allergen Reactions  . Codeine Sulfate Nausea Only   . Contrast Media [Iodinated Diagnostic Agents] Hives  . Gadobutrol Hives  . Gadolinium Derivatives Hives  . Penicillins Hives and Itching    Has patient had a PCN reaction causing immediate rash, facial/tongue/throat swelling, SOB or lightheadedness with hypotension: No Has patient had a PCN reaction causing severe rash involving mucus membranes or skin necrosis: No Has patient had a PCN reaction that required hospitalization: No Has patient had a PCN reaction occurring within the last 10 years: No If all of the above answers are "NO", then may proceed with Cephalosporin use.   . Statins Other (See Comments)    muscle pain       Assessment & Plan:   1. End stage renal disease (HCC)-stable  Recommend:  Duplex ultrasound of the AV access shows a patent access with no hemodynamically significant increases in a normal flow volume.  No significant change in the maximum velocities when compared to the previous exam on 05/26/2017.  Flow volume is decreased from previous exam, however the volumes are within normal limits.   The patient is doing well and currently has adequate dialysis access. The patient's dialysis center is not reporting any access issues. Flow pattern is stable when compared to the prior ultrasound.  The patient should have a duplex ultrasound of the dialysis access in 6 months. The patient will follow-up with me in the office after each ultrasound      - VAS [place] (AVF,AVG); Future  2. Complication of vascular access for dialysis, sequela-stable See above - VAS US DUPLEX DIALYSIS ACCESS (AVF,AVG); Future  3. Hypertension secondary to other renal disorders -stable  Continue antihypertensive medications as already ordered, these medications have been reviewed and there are no changes at this time.   4. Mixed hyperlipidemia-stable  Continue statin as ordered and reviewed, no changes at this time    Current Outpatient Medications on File  Prior to Visit  Medication Sig Dispense Refill  . acetaminophen (TYLENOL) 500 MG tablet Tylenol Extra Strength 500 mg tablet  Take 2 tablets every 8 hours by oral route as needed.    [name] aspirin 81 MG tablet Take 81 mg by mouth daily.    . cinacalcet (SENSIPAR) 30 MG tablet Take 30 mg by mouth 3 (three) times a week. On dialysis days - Tues, Thurs, Sat    . DiphenhydrAMINE HCl, Sleep, 50  MG tablet Take 1 tablet by mouth 30 minutes before CT scan    . DUREZOL 0.05 % EMUL Place 1 drop into both eyes 2 (two) times daily.  0  . isosorbide mononitrate (IMDUR) 30 MG 24 hr tablet Take 30 mg by mouth daily.    [name] latanoprost (XALATAN) 0.005 % ophthalmic solution Place 1 drop into both eyes 2 (two) times daily.     . metoprolol succinate (TOPROL-XL) 25 MG 24 hr tablet Take 25 mg by mouth daily.    [name] POLYETHYLENE GLYCOL 3350 PO polyethylene glycol 3350 17 gram/dose oral powder  Take 17 g every day by oral route.    . pravastatin (PRAVACHOL) 20 MG tablet Take 20 mg by mouth daily.    . predniSONE (DELTASONE) 50 MG tablet Take 50 mg by mouth once.    . ranitidine (ZANTAC) 150 MG tablet Take 150 mg by mouth 2 (two) times daily.    . sevelamer carbonate (RENVELA) 800 MG tablet Take 1,600 mg by mouth 3 (three) times daily with meals.     . timolol (TIMOPTIC) 0.5 % ophthalmic solution Administer 1 drop to both eyes daily.     [name] amitriptyline (ELAVIL) 25 MG tablet Take 25 mg by mouth.     No current facility-administered medications on file prior to visit.     There are no Patient Instructions on file for this visit. No follow-ups on file.   Kris Hartmann, NP

## 2017-12-25 DIAGNOSIS — N186 End stage renal disease: Secondary | ICD-10-CM | POA: Diagnosis not present

## 2017-12-25 DIAGNOSIS — N2581 Secondary hyperparathyroidism of renal origin: Secondary | ICD-10-CM | POA: Diagnosis not present

## 2017-12-27 DIAGNOSIS — N2581 Secondary hyperparathyroidism of renal origin: Secondary | ICD-10-CM | POA: Diagnosis not present

## 2017-12-27 DIAGNOSIS — N186 End stage renal disease: Secondary | ICD-10-CM | POA: Diagnosis not present

## 2017-12-30 DIAGNOSIS — N2581 Secondary hyperparathyroidism of renal origin: Secondary | ICD-10-CM | POA: Diagnosis not present

## 2017-12-30 DIAGNOSIS — N186 End stage renal disease: Secondary | ICD-10-CM | POA: Diagnosis not present

## 2018-01-01 DIAGNOSIS — N2581 Secondary hyperparathyroidism of renal origin: Secondary | ICD-10-CM | POA: Diagnosis not present

## 2018-01-01 DIAGNOSIS — N186 End stage renal disease: Secondary | ICD-10-CM | POA: Diagnosis not present

## 2018-01-03 DIAGNOSIS — E1122 Type 2 diabetes mellitus with diabetic chronic kidney disease: Secondary | ICD-10-CM | POA: Diagnosis not present

## 2018-01-03 DIAGNOSIS — N2581 Secondary hyperparathyroidism of renal origin: Secondary | ICD-10-CM | POA: Diagnosis not present

## 2018-01-03 DIAGNOSIS — N186 End stage renal disease: Secondary | ICD-10-CM | POA: Diagnosis not present

## 2018-01-03 DIAGNOSIS — Z992 Dependence on renal dialysis: Secondary | ICD-10-CM | POA: Diagnosis not present

## 2018-01-06 DIAGNOSIS — N186 End stage renal disease: Secondary | ICD-10-CM | POA: Diagnosis not present

## 2018-01-06 DIAGNOSIS — N2581 Secondary hyperparathyroidism of renal origin: Secondary | ICD-10-CM | POA: Diagnosis not present

## 2018-01-08 DIAGNOSIS — N186 End stage renal disease: Secondary | ICD-10-CM | POA: Diagnosis not present

## 2018-01-08 DIAGNOSIS — N2581 Secondary hyperparathyroidism of renal origin: Secondary | ICD-10-CM | POA: Diagnosis not present

## 2018-01-10 DIAGNOSIS — N2581 Secondary hyperparathyroidism of renal origin: Secondary | ICD-10-CM | POA: Diagnosis not present

## 2018-01-10 DIAGNOSIS — N186 End stage renal disease: Secondary | ICD-10-CM | POA: Diagnosis not present

## 2018-01-13 DIAGNOSIS — N2581 Secondary hyperparathyroidism of renal origin: Secondary | ICD-10-CM | POA: Diagnosis not present

## 2018-01-13 DIAGNOSIS — N186 End stage renal disease: Secondary | ICD-10-CM | POA: Diagnosis not present

## 2018-01-15 DIAGNOSIS — N186 End stage renal disease: Secondary | ICD-10-CM | POA: Diagnosis not present

## 2018-01-15 DIAGNOSIS — N2581 Secondary hyperparathyroidism of renal origin: Secondary | ICD-10-CM | POA: Diagnosis not present

## 2018-01-17 DIAGNOSIS — N186 End stage renal disease: Secondary | ICD-10-CM | POA: Diagnosis not present

## 2018-01-17 DIAGNOSIS — N2581 Secondary hyperparathyroidism of renal origin: Secondary | ICD-10-CM | POA: Diagnosis not present

## 2018-01-20 DIAGNOSIS — N186 End stage renal disease: Secondary | ICD-10-CM | POA: Diagnosis not present

## 2018-01-20 DIAGNOSIS — N2581 Secondary hyperparathyroidism of renal origin: Secondary | ICD-10-CM | POA: Diagnosis not present

## 2018-01-22 DIAGNOSIS — N186 End stage renal disease: Secondary | ICD-10-CM | POA: Diagnosis not present

## 2018-01-22 DIAGNOSIS — N2581 Secondary hyperparathyroidism of renal origin: Secondary | ICD-10-CM | POA: Diagnosis not present

## 2018-01-24 DIAGNOSIS — N186 End stage renal disease: Secondary | ICD-10-CM | POA: Diagnosis not present

## 2018-01-24 DIAGNOSIS — N2581 Secondary hyperparathyroidism of renal origin: Secondary | ICD-10-CM | POA: Diagnosis not present

## 2018-01-27 DIAGNOSIS — N2581 Secondary hyperparathyroidism of renal origin: Secondary | ICD-10-CM | POA: Diagnosis not present

## 2018-01-27 DIAGNOSIS — N186 End stage renal disease: Secondary | ICD-10-CM | POA: Diagnosis not present

## 2018-01-29 DIAGNOSIS — N2581 Secondary hyperparathyroidism of renal origin: Secondary | ICD-10-CM | POA: Diagnosis not present

## 2018-01-29 DIAGNOSIS — N186 End stage renal disease: Secondary | ICD-10-CM | POA: Diagnosis not present

## 2018-01-31 DIAGNOSIS — N2581 Secondary hyperparathyroidism of renal origin: Secondary | ICD-10-CM | POA: Diagnosis not present

## 2018-01-31 DIAGNOSIS — N186 End stage renal disease: Secondary | ICD-10-CM | POA: Diagnosis not present

## 2018-02-02 DIAGNOSIS — N186 End stage renal disease: Secondary | ICD-10-CM | POA: Diagnosis not present

## 2018-02-02 DIAGNOSIS — Z992 Dependence on renal dialysis: Secondary | ICD-10-CM | POA: Diagnosis not present

## 2018-02-02 DIAGNOSIS — E1122 Type 2 diabetes mellitus with diabetic chronic kidney disease: Secondary | ICD-10-CM | POA: Diagnosis not present

## 2018-02-03 DIAGNOSIS — N2581 Secondary hyperparathyroidism of renal origin: Secondary | ICD-10-CM | POA: Diagnosis not present

## 2018-02-03 DIAGNOSIS — Z23 Encounter for immunization: Secondary | ICD-10-CM | POA: Diagnosis not present

## 2018-02-03 DIAGNOSIS — N186 End stage renal disease: Secondary | ICD-10-CM | POA: Diagnosis not present

## 2018-02-05 DIAGNOSIS — N186 End stage renal disease: Secondary | ICD-10-CM | POA: Diagnosis not present

## 2018-02-05 DIAGNOSIS — N2581 Secondary hyperparathyroidism of renal origin: Secondary | ICD-10-CM | POA: Diagnosis not present

## 2018-02-05 DIAGNOSIS — Z23 Encounter for immunization: Secondary | ICD-10-CM | POA: Diagnosis not present

## 2018-02-07 DIAGNOSIS — N2581 Secondary hyperparathyroidism of renal origin: Secondary | ICD-10-CM | POA: Diagnosis not present

## 2018-02-07 DIAGNOSIS — Z23 Encounter for immunization: Secondary | ICD-10-CM | POA: Diagnosis not present

## 2018-02-07 DIAGNOSIS — N186 End stage renal disease: Secondary | ICD-10-CM | POA: Diagnosis not present

## 2018-02-10 DIAGNOSIS — Z23 Encounter for immunization: Secondary | ICD-10-CM | POA: Diagnosis not present

## 2018-02-10 DIAGNOSIS — N2581 Secondary hyperparathyroidism of renal origin: Secondary | ICD-10-CM | POA: Diagnosis not present

## 2018-02-10 DIAGNOSIS — N186 End stage renal disease: Secondary | ICD-10-CM | POA: Diagnosis not present

## 2018-02-12 DIAGNOSIS — N2581 Secondary hyperparathyroidism of renal origin: Secondary | ICD-10-CM | POA: Diagnosis not present

## 2018-02-12 DIAGNOSIS — Z23 Encounter for immunization: Secondary | ICD-10-CM | POA: Diagnosis not present

## 2018-02-12 DIAGNOSIS — N186 End stage renal disease: Secondary | ICD-10-CM | POA: Diagnosis not present

## 2018-02-14 DIAGNOSIS — N2581 Secondary hyperparathyroidism of renal origin: Secondary | ICD-10-CM | POA: Diagnosis not present

## 2018-02-14 DIAGNOSIS — Z23 Encounter for immunization: Secondary | ICD-10-CM | POA: Diagnosis not present

## 2018-02-14 DIAGNOSIS — N186 End stage renal disease: Secondary | ICD-10-CM | POA: Diagnosis not present

## 2018-02-17 DIAGNOSIS — Z23 Encounter for immunization: Secondary | ICD-10-CM | POA: Diagnosis not present

## 2018-02-17 DIAGNOSIS — N186 End stage renal disease: Secondary | ICD-10-CM | POA: Diagnosis not present

## 2018-02-17 DIAGNOSIS — N2581 Secondary hyperparathyroidism of renal origin: Secondary | ICD-10-CM | POA: Diagnosis not present

## 2018-02-19 DIAGNOSIS — N2581 Secondary hyperparathyroidism of renal origin: Secondary | ICD-10-CM | POA: Diagnosis not present

## 2018-02-19 DIAGNOSIS — N186 End stage renal disease: Secondary | ICD-10-CM | POA: Diagnosis not present

## 2018-02-19 DIAGNOSIS — Z23 Encounter for immunization: Secondary | ICD-10-CM | POA: Diagnosis not present

## 2018-02-19 DIAGNOSIS — I10 Essential (primary) hypertension: Secondary | ICD-10-CM | POA: Diagnosis not present

## 2018-02-19 DIAGNOSIS — G629 Polyneuropathy, unspecified: Secondary | ICD-10-CM | POA: Diagnosis not present

## 2018-02-19 DIAGNOSIS — E119 Type 2 diabetes mellitus without complications: Secondary | ICD-10-CM | POA: Diagnosis not present

## 2018-02-19 DIAGNOSIS — Z992 Dependence on renal dialysis: Secondary | ICD-10-CM | POA: Diagnosis not present

## 2018-02-19 DIAGNOSIS — E785 Hyperlipidemia, unspecified: Secondary | ICD-10-CM | POA: Diagnosis not present

## 2018-02-21 DIAGNOSIS — Z23 Encounter for immunization: Secondary | ICD-10-CM | POA: Diagnosis not present

## 2018-02-21 DIAGNOSIS — N2581 Secondary hyperparathyroidism of renal origin: Secondary | ICD-10-CM | POA: Diagnosis not present

## 2018-02-21 DIAGNOSIS — N186 End stage renal disease: Secondary | ICD-10-CM | POA: Diagnosis not present

## 2018-02-24 DIAGNOSIS — N2581 Secondary hyperparathyroidism of renal origin: Secondary | ICD-10-CM | POA: Diagnosis not present

## 2018-02-24 DIAGNOSIS — Z23 Encounter for immunization: Secondary | ICD-10-CM | POA: Diagnosis not present

## 2018-02-24 DIAGNOSIS — N186 End stage renal disease: Secondary | ICD-10-CM | POA: Diagnosis not present

## 2018-02-26 DIAGNOSIS — Z23 Encounter for immunization: Secondary | ICD-10-CM | POA: Diagnosis not present

## 2018-02-26 DIAGNOSIS — N186 End stage renal disease: Secondary | ICD-10-CM | POA: Diagnosis not present

## 2018-02-26 DIAGNOSIS — N2581 Secondary hyperparathyroidism of renal origin: Secondary | ICD-10-CM | POA: Diagnosis not present

## 2018-02-28 DIAGNOSIS — N2581 Secondary hyperparathyroidism of renal origin: Secondary | ICD-10-CM | POA: Diagnosis not present

## 2018-02-28 DIAGNOSIS — N186 End stage renal disease: Secondary | ICD-10-CM | POA: Diagnosis not present

## 2018-02-28 DIAGNOSIS — Z23 Encounter for immunization: Secondary | ICD-10-CM | POA: Diagnosis not present

## 2018-03-03 DIAGNOSIS — N186 End stage renal disease: Secondary | ICD-10-CM | POA: Diagnosis not present

## 2018-03-03 DIAGNOSIS — Z23 Encounter for immunization: Secondary | ICD-10-CM | POA: Diagnosis not present

## 2018-03-03 DIAGNOSIS — N2581 Secondary hyperparathyroidism of renal origin: Secondary | ICD-10-CM | POA: Diagnosis not present

## 2018-03-05 DIAGNOSIS — E1122 Type 2 diabetes mellitus with diabetic chronic kidney disease: Secondary | ICD-10-CM | POA: Diagnosis not present

## 2018-03-05 DIAGNOSIS — Z992 Dependence on renal dialysis: Secondary | ICD-10-CM | POA: Diagnosis not present

## 2018-03-05 DIAGNOSIS — Z23 Encounter for immunization: Secondary | ICD-10-CM | POA: Diagnosis not present

## 2018-03-05 DIAGNOSIS — N186 End stage renal disease: Secondary | ICD-10-CM | POA: Diagnosis not present

## 2018-03-05 DIAGNOSIS — N2581 Secondary hyperparathyroidism of renal origin: Secondary | ICD-10-CM | POA: Diagnosis not present

## 2018-03-07 DIAGNOSIS — N186 End stage renal disease: Secondary | ICD-10-CM | POA: Diagnosis not present

## 2018-03-07 DIAGNOSIS — N2581 Secondary hyperparathyroidism of renal origin: Secondary | ICD-10-CM | POA: Diagnosis not present

## 2018-03-10 DIAGNOSIS — N2581 Secondary hyperparathyroidism of renal origin: Secondary | ICD-10-CM | POA: Diagnosis not present

## 2018-03-10 DIAGNOSIS — N186 End stage renal disease: Secondary | ICD-10-CM | POA: Diagnosis not present

## 2018-03-11 ENCOUNTER — Encounter

## 2018-03-11 ENCOUNTER — Encounter (INDEPENDENT_AMBULATORY_CARE_PROVIDER_SITE_OTHER): Payer: Medicare Other

## 2018-03-11 ENCOUNTER — Ambulatory Visit (INDEPENDENT_AMBULATORY_CARE_PROVIDER_SITE_OTHER): Payer: Medicare Other | Admitting: Vascular Surgery

## 2018-03-12 DIAGNOSIS — N2581 Secondary hyperparathyroidism of renal origin: Secondary | ICD-10-CM | POA: Diagnosis not present

## 2018-03-12 DIAGNOSIS — N186 End stage renal disease: Secondary | ICD-10-CM | POA: Diagnosis not present

## 2018-03-14 DIAGNOSIS — N186 End stage renal disease: Secondary | ICD-10-CM | POA: Diagnosis not present

## 2018-03-14 DIAGNOSIS — N2581 Secondary hyperparathyroidism of renal origin: Secondary | ICD-10-CM | POA: Diagnosis not present

## 2018-03-17 DIAGNOSIS — N2581 Secondary hyperparathyroidism of renal origin: Secondary | ICD-10-CM | POA: Diagnosis not present

## 2018-03-17 DIAGNOSIS — N186 End stage renal disease: Secondary | ICD-10-CM | POA: Diagnosis not present

## 2018-03-19 DIAGNOSIS — N186 End stage renal disease: Secondary | ICD-10-CM | POA: Diagnosis not present

## 2018-03-19 DIAGNOSIS — N2581 Secondary hyperparathyroidism of renal origin: Secondary | ICD-10-CM | POA: Diagnosis not present

## 2018-03-21 DIAGNOSIS — N186 End stage renal disease: Secondary | ICD-10-CM | POA: Diagnosis not present

## 2018-03-21 DIAGNOSIS — N2581 Secondary hyperparathyroidism of renal origin: Secondary | ICD-10-CM | POA: Diagnosis not present

## 2018-03-24 DIAGNOSIS — N2581 Secondary hyperparathyroidism of renal origin: Secondary | ICD-10-CM | POA: Diagnosis not present

## 2018-03-24 DIAGNOSIS — N186 End stage renal disease: Secondary | ICD-10-CM | POA: Diagnosis not present

## 2018-03-26 DIAGNOSIS — N2581 Secondary hyperparathyroidism of renal origin: Secondary | ICD-10-CM | POA: Diagnosis not present

## 2018-03-26 DIAGNOSIS — N186 End stage renal disease: Secondary | ICD-10-CM | POA: Diagnosis not present

## 2018-03-27 DIAGNOSIS — Z91041 Radiographic dye allergy status: Secondary | ICD-10-CM | POA: Diagnosis not present

## 2018-03-27 DIAGNOSIS — M7989 Other specified soft tissue disorders: Secondary | ICD-10-CM | POA: Diagnosis not present

## 2018-03-27 DIAGNOSIS — Z79899 Other long term (current) drug therapy: Secondary | ICD-10-CM | POA: Diagnosis not present

## 2018-03-27 DIAGNOSIS — I2510 Atherosclerotic heart disease of native coronary artery without angina pectoris: Secondary | ICD-10-CM | POA: Diagnosis not present

## 2018-03-27 DIAGNOSIS — Z888 Allergy status to other drugs, medicaments and biological substances status: Secondary | ICD-10-CM | POA: Diagnosis not present

## 2018-03-27 DIAGNOSIS — R05 Cough: Secondary | ICD-10-CM | POA: Diagnosis not present

## 2018-03-27 DIAGNOSIS — J069 Acute upper respiratory infection, unspecified: Secondary | ICD-10-CM | POA: Diagnosis not present

## 2018-03-27 DIAGNOSIS — Z885 Allergy status to narcotic agent status: Secondary | ICD-10-CM | POA: Diagnosis not present

## 2018-03-27 DIAGNOSIS — I120 Hypertensive chronic kidney disease with stage 5 chronic kidney disease or end stage renal disease: Secondary | ICD-10-CM | POA: Diagnosis not present

## 2018-03-27 DIAGNOSIS — Z880 Allergy status to penicillin: Secondary | ICD-10-CM | POA: Diagnosis not present

## 2018-03-27 DIAGNOSIS — J029 Acute pharyngitis, unspecified: Secondary | ICD-10-CM | POA: Diagnosis not present

## 2018-03-27 DIAGNOSIS — Z951 Presence of aortocoronary bypass graft: Secondary | ICD-10-CM | POA: Diagnosis not present

## 2018-03-27 DIAGNOSIS — E1122 Type 2 diabetes mellitus with diabetic chronic kidney disease: Secondary | ICD-10-CM | POA: Diagnosis not present

## 2018-03-27 DIAGNOSIS — Z7982 Long term (current) use of aspirin: Secondary | ICD-10-CM | POA: Diagnosis not present

## 2018-03-27 DIAGNOSIS — N186 End stage renal disease: Secondary | ICD-10-CM | POA: Diagnosis not present

## 2018-03-28 DIAGNOSIS — N186 End stage renal disease: Secondary | ICD-10-CM | POA: Diagnosis not present

## 2018-03-28 DIAGNOSIS — N2581 Secondary hyperparathyroidism of renal origin: Secondary | ICD-10-CM | POA: Diagnosis not present

## 2018-03-30 DIAGNOSIS — N186 End stage renal disease: Secondary | ICD-10-CM | POA: Diagnosis not present

## 2018-03-30 DIAGNOSIS — N2581 Secondary hyperparathyroidism of renal origin: Secondary | ICD-10-CM | POA: Diagnosis not present

## 2018-04-01 DIAGNOSIS — N186 End stage renal disease: Secondary | ICD-10-CM | POA: Diagnosis not present

## 2018-04-01 DIAGNOSIS — N2581 Secondary hyperparathyroidism of renal origin: Secondary | ICD-10-CM | POA: Diagnosis not present

## 2018-04-04 DIAGNOSIS — Z992 Dependence on renal dialysis: Secondary | ICD-10-CM | POA: Diagnosis not present

## 2018-04-04 DIAGNOSIS — N186 End stage renal disease: Secondary | ICD-10-CM | POA: Diagnosis not present

## 2018-04-04 DIAGNOSIS — N2581 Secondary hyperparathyroidism of renal origin: Secondary | ICD-10-CM | POA: Diagnosis not present

## 2018-04-04 DIAGNOSIS — E1122 Type 2 diabetes mellitus with diabetic chronic kidney disease: Secondary | ICD-10-CM | POA: Diagnosis not present

## 2018-04-07 DIAGNOSIS — N186 End stage renal disease: Secondary | ICD-10-CM | POA: Diagnosis not present

## 2018-04-07 DIAGNOSIS — N2581 Secondary hyperparathyroidism of renal origin: Secondary | ICD-10-CM | POA: Diagnosis not present

## 2018-04-07 DIAGNOSIS — D509 Iron deficiency anemia, unspecified: Secondary | ICD-10-CM | POA: Diagnosis not present

## 2018-04-08 DIAGNOSIS — I10 Essential (primary) hypertension: Secondary | ICD-10-CM | POA: Diagnosis not present

## 2018-04-08 DIAGNOSIS — E119 Type 2 diabetes mellitus without complications: Secondary | ICD-10-CM | POA: Diagnosis not present

## 2018-04-08 DIAGNOSIS — K219 Gastro-esophageal reflux disease without esophagitis: Secondary | ICD-10-CM | POA: Diagnosis not present

## 2018-04-08 DIAGNOSIS — R7309 Other abnormal glucose: Secondary | ICD-10-CM | POA: Diagnosis not present

## 2018-04-08 DIAGNOSIS — R05 Cough: Secondary | ICD-10-CM | POA: Diagnosis not present

## 2018-04-09 DIAGNOSIS — D509 Iron deficiency anemia, unspecified: Secondary | ICD-10-CM | POA: Diagnosis not present

## 2018-04-09 DIAGNOSIS — N186 End stage renal disease: Secondary | ICD-10-CM | POA: Diagnosis not present

## 2018-04-09 DIAGNOSIS — N2581 Secondary hyperparathyroidism of renal origin: Secondary | ICD-10-CM | POA: Diagnosis not present

## 2018-04-11 DIAGNOSIS — N2581 Secondary hyperparathyroidism of renal origin: Secondary | ICD-10-CM | POA: Diagnosis not present

## 2018-04-11 DIAGNOSIS — N186 End stage renal disease: Secondary | ICD-10-CM | POA: Diagnosis not present

## 2018-04-11 DIAGNOSIS — D509 Iron deficiency anemia, unspecified: Secondary | ICD-10-CM | POA: Diagnosis not present

## 2018-04-14 DIAGNOSIS — D509 Iron deficiency anemia, unspecified: Secondary | ICD-10-CM | POA: Diagnosis not present

## 2018-04-14 DIAGNOSIS — N2581 Secondary hyperparathyroidism of renal origin: Secondary | ICD-10-CM | POA: Diagnosis not present

## 2018-04-14 DIAGNOSIS — N186 End stage renal disease: Secondary | ICD-10-CM | POA: Diagnosis not present

## 2018-04-16 DIAGNOSIS — N2581 Secondary hyperparathyroidism of renal origin: Secondary | ICD-10-CM | POA: Diagnosis not present

## 2018-04-16 DIAGNOSIS — D509 Iron deficiency anemia, unspecified: Secondary | ICD-10-CM | POA: Diagnosis not present

## 2018-04-16 DIAGNOSIS — N186 End stage renal disease: Secondary | ICD-10-CM | POA: Diagnosis not present

## 2018-04-18 DIAGNOSIS — N2581 Secondary hyperparathyroidism of renal origin: Secondary | ICD-10-CM | POA: Diagnosis not present

## 2018-04-18 DIAGNOSIS — N186 End stage renal disease: Secondary | ICD-10-CM | POA: Diagnosis not present

## 2018-04-18 DIAGNOSIS — D509 Iron deficiency anemia, unspecified: Secondary | ICD-10-CM | POA: Diagnosis not present

## 2018-04-21 DIAGNOSIS — D509 Iron deficiency anemia, unspecified: Secondary | ICD-10-CM | POA: Diagnosis not present

## 2018-04-21 DIAGNOSIS — N186 End stage renal disease: Secondary | ICD-10-CM | POA: Diagnosis not present

## 2018-04-21 DIAGNOSIS — N2581 Secondary hyperparathyroidism of renal origin: Secondary | ICD-10-CM | POA: Diagnosis not present

## 2018-04-22 DIAGNOSIS — K219 Gastro-esophageal reflux disease without esophagitis: Secondary | ICD-10-CM | POA: Diagnosis not present

## 2018-04-22 DIAGNOSIS — R05 Cough: Secondary | ICD-10-CM | POA: Diagnosis not present

## 2018-04-22 DIAGNOSIS — Z0000 Encounter for general adult medical examination without abnormal findings: Secondary | ICD-10-CM | POA: Diagnosis not present

## 2018-04-22 DIAGNOSIS — R7309 Other abnormal glucose: Secondary | ICD-10-CM | POA: Diagnosis not present

## 2018-04-23 DIAGNOSIS — N2581 Secondary hyperparathyroidism of renal origin: Secondary | ICD-10-CM | POA: Diagnosis not present

## 2018-04-23 DIAGNOSIS — D509 Iron deficiency anemia, unspecified: Secondary | ICD-10-CM | POA: Diagnosis not present

## 2018-04-23 DIAGNOSIS — N186 End stage renal disease: Secondary | ICD-10-CM | POA: Diagnosis not present

## 2018-04-25 DIAGNOSIS — N2581 Secondary hyperparathyroidism of renal origin: Secondary | ICD-10-CM | POA: Diagnosis not present

## 2018-04-25 DIAGNOSIS — D509 Iron deficiency anemia, unspecified: Secondary | ICD-10-CM | POA: Diagnosis not present

## 2018-04-25 DIAGNOSIS — N186 End stage renal disease: Secondary | ICD-10-CM | POA: Diagnosis not present

## 2018-04-27 DIAGNOSIS — N2581 Secondary hyperparathyroidism of renal origin: Secondary | ICD-10-CM | POA: Diagnosis not present

## 2018-04-27 DIAGNOSIS — D509 Iron deficiency anemia, unspecified: Secondary | ICD-10-CM | POA: Diagnosis not present

## 2018-04-27 DIAGNOSIS — N186 End stage renal disease: Secondary | ICD-10-CM | POA: Diagnosis not present

## 2018-04-30 DIAGNOSIS — N186 End stage renal disease: Secondary | ICD-10-CM | POA: Diagnosis not present

## 2018-04-30 DIAGNOSIS — N2581 Secondary hyperparathyroidism of renal origin: Secondary | ICD-10-CM | POA: Diagnosis not present

## 2018-04-30 DIAGNOSIS — D509 Iron deficiency anemia, unspecified: Secondary | ICD-10-CM | POA: Diagnosis not present

## 2018-05-02 DIAGNOSIS — N186 End stage renal disease: Secondary | ICD-10-CM | POA: Diagnosis not present

## 2018-05-02 DIAGNOSIS — D509 Iron deficiency anemia, unspecified: Secondary | ICD-10-CM | POA: Diagnosis not present

## 2018-05-02 DIAGNOSIS — N2581 Secondary hyperparathyroidism of renal origin: Secondary | ICD-10-CM | POA: Diagnosis not present

## 2018-05-04 DIAGNOSIS — N186 End stage renal disease: Secondary | ICD-10-CM | POA: Diagnosis not present

## 2018-05-04 DIAGNOSIS — D509 Iron deficiency anemia, unspecified: Secondary | ICD-10-CM | POA: Diagnosis not present

## 2018-05-04 DIAGNOSIS — N2581 Secondary hyperparathyroidism of renal origin: Secondary | ICD-10-CM | POA: Diagnosis not present

## 2018-05-05 DIAGNOSIS — N186 End stage renal disease: Secondary | ICD-10-CM | POA: Diagnosis not present

## 2018-05-05 DIAGNOSIS — E1122 Type 2 diabetes mellitus with diabetic chronic kidney disease: Secondary | ICD-10-CM | POA: Diagnosis not present

## 2018-05-05 DIAGNOSIS — Z992 Dependence on renal dialysis: Secondary | ICD-10-CM | POA: Diagnosis not present

## 2018-07-01 ENCOUNTER — Ambulatory Visit (INDEPENDENT_AMBULATORY_CARE_PROVIDER_SITE_OTHER): Payer: Medicare Other | Admitting: Vascular Surgery

## 2018-07-01 ENCOUNTER — Encounter (INDEPENDENT_AMBULATORY_CARE_PROVIDER_SITE_OTHER): Payer: Medicare Other

## 2018-07-15 ENCOUNTER — Other Ambulatory Visit: Payer: Self-pay

## 2018-07-15 ENCOUNTER — Encounter (INDEPENDENT_AMBULATORY_CARE_PROVIDER_SITE_OTHER): Payer: Self-pay | Admitting: Vascular Surgery

## 2018-07-15 ENCOUNTER — Ambulatory Visit (INDEPENDENT_AMBULATORY_CARE_PROVIDER_SITE_OTHER): Payer: Medicare Other | Admitting: Vascular Surgery

## 2018-07-15 ENCOUNTER — Ambulatory Visit (INDEPENDENT_AMBULATORY_CARE_PROVIDER_SITE_OTHER): Payer: Medicare Other

## 2018-07-15 VITALS — BP 110/68 | HR 64 | Resp 10 | Ht 59.0 in | Wt 174.0 lb

## 2018-07-15 DIAGNOSIS — Z992 Dependence on renal dialysis: Secondary | ICD-10-CM

## 2018-07-15 DIAGNOSIS — T829XXS Unspecified complication of cardiac and vascular prosthetic device, implant and graft, sequela: Secondary | ICD-10-CM

## 2018-07-15 DIAGNOSIS — N186 End stage renal disease: Secondary | ICD-10-CM

## 2018-07-15 DIAGNOSIS — N2889 Other specified disorders of kidney and ureter: Secondary | ICD-10-CM

## 2018-07-15 DIAGNOSIS — I120 Hypertensive chronic kidney disease with stage 5 chronic kidney disease or end stage renal disease: Secondary | ICD-10-CM | POA: Diagnosis not present

## 2018-07-15 DIAGNOSIS — E782 Mixed hyperlipidemia: Secondary | ICD-10-CM | POA: Diagnosis not present

## 2018-07-15 DIAGNOSIS — Z79899 Other long term (current) drug therapy: Secondary | ICD-10-CM

## 2018-07-15 DIAGNOSIS — I151 Hypertension secondary to other renal disorders: Secondary | ICD-10-CM

## 2018-07-15 NOTE — Progress Notes (Signed)
Subjective:    Patient ID: Kelli Nguyen, female    DOB: [birth year], 73 y.o.   MRN: [id number] Chief Complaint  Patient presents with  . Follow-up    Patient presents for a 65-month HDA follow-up. The patient underwent a duplex ultrasound of the AV access which was notable for a patent HeRO graft without any significant hemodynamic stenosis. Total flow volume was 1482. Patient reports his hemodialysis doppler flow is stable as per dialysis center flow studies. The patient denies any issues with hemodialysis such as cannulation problems, increased bleeding, decrease in doppler flow or recirculation. The patient also denies any fistula skin breakdown, pain, edema, pallor or ulceration of the arm / hand.   Review of Systems  Constitutional: Negative.   HENT: Negative.   Eyes: Negative.   Respiratory: Negative.   Cardiovascular: Negative.   Gastrointestinal: Negative.   Endocrine: Negative.   Genitourinary:       ESRD  Musculoskeletal: Negative.   Skin: Negative.   Allergic/Immunologic: Negative.   Neurological: Negative.   Hematological: Negative.   Psychiatric/Behavioral: Negative.       Objective:   Physical Exam Vitals signs reviewed.  Constitutional:      Appearance: Normal appearance. She is normal weight.  HENT:     Head: Normocephalic and atraumatic.     Right Ear: External ear normal.     Left Ear: External ear normal.     Nose: Nose normal.     Mouth/Throat:     Mouth: Mucous membranes are moist.     Pharynx: Oropharynx is clear.  Eyes:     Extraocular Movements: Extraocular movements intact.     Conjunctiva/sclera: Conjunctivae normal.     Pupils: Pupils are equal, round, and reactive to light.  Neck:     Musculoskeletal: Normal range of motion.  Cardiovascular:     Rate and Rhythm: Normal rate and regular rhythm.     Pulses: Normal pulses.     Comments: 2+ radial pulse bilaterally Pulmonary:     Effort: Pulmonary effort is normal.     Breath sounds:  Normal breath sounds.  Musculoskeletal: Normal range of motion.        General: No swelling.  Skin:    General: Skin is warm and dry.  Neurological:     General: No focal deficit present.     Mental Status: She is alert and oriented to person, place, and time. Mental status is at baseline.  Psychiatric:        Mood and Affect: Mood normal.        Behavior: Behavior normal.        Thought Content: Thought content normal.        Judgment: Judgment normal.    BP 110/68 (BP Location: Left Wrist, Patient Position: Sitting, Cuff Size: Small)   Pulse 64   Resp 10   Ht 4\' 11"  (1.499 m)   Wt 174 lb (78.9 kg)   BMI 35.14 kg/m    Past Medical History:  Diagnosis Date  . Anemia   . Chronic cough   . Chronic kidney disease   . Coronary artery disease   . Diabetes mellitus without complication ([place])   . Dialysis patient ([place])    T-TH-SAT  . GERD (gastroesophageal reflux disease)   . Hypertension   . Neuropathy    hands  . Wears dentures    partial upper and lower   Social History   Socioeconomic History  . Marital status: Widowed  Spouse name: Not on file  . Number of children: Not on file  . Years of education: Not on file  . Highest education level: Not on file  Occupational History  . Not on file  Social Needs  . Financial resource strain: Not on file  . Food insecurity:    Worry: Not on file    Inability: Not on file  . Transportation needs:    Medical: Not on file    Non-medical: Not on file  Tobacco Use  . Smoking status: Never Smoker  . Smokeless tobacco: Never Used  Substance and Sexual Activity  . Alcohol use: No  . Drug use: No  . Sexual activity: Not on file  Lifestyle  . Physical activity:    Days per week: Not on file    Minutes per session: Not on file  . Stress: Not on file  Relationships  . Social connections:    Talks on phone: Not on file    Gets together: Not on file    Attends religious service: Not on file    Active member of club or  organization: Not on file    Attends meetings of clubs or organizations: Not on file    Relationship status: Not on file  . Intimate partner violence:    Fear of current or ex partner: Not on file    Emotionally abused: Not on file    Physically abused: Not on file    Forced sexual activity: Not on file  Other Topics Concern  . Not on file  Social History Narrative  . Not on file   Past Surgical History:  Procedure Laterality Date  . A/V SHUNT INTERVENTION N/A 10/16/2016   Procedure: A/V Shunt Intervention;  Surgeon: Katha Cabal, MD;  Location: [place] [facility];  Service: Cardiovascular;  Laterality: N/A;  . A/V SHUNTOGRAM Left 10/16/2016   Procedure: A/V Shuntogram;  Surgeon: Katha Cabal, MD;  Location: [place] [facility];  Service: Cardiovascular;  Laterality: Left;  . ABDOMINAL HYSTERECTOMY    . CARPAL TUNNEL RELEASE Left 09/27/2015   Procedure: CARPAL TUNNEL RELEASE;  Surgeon: Leanor Kail, MD;  Location: [facility];  Service: Orthopedics;  Laterality: Left;  . CATARACT EXTRACTION W/PHACO Right 09/23/2016   Procedure: CATARACT EXTRACTION PHACO AND INTRAOCULAR LENS PLACEMENT (IOC) complicated diabetic right;  Surgeon: Ronnell Freshwater, MD;  Location: [place];  Service: Ophthalmology;  Laterality: Right;  Diabetic - diet controlled Potassium draw before surgery  . CATARACT EXTRACTION W/PHACO Left 01/20/2017   Procedure: CATARACT EXTRACTION PHACO AND INTRAOCULAR LENS PLACEMENT (IOC) LEFT DIABETIC;  Surgeon: Eulogio Bear, MD;  Location: [place];  Service: Ophthalmology;  Laterality: Left;  Diabetic - diet controlled Potassium draw prior to procedure  8:00 ARRIVAL  . COLONOSCOPY    . COLONOSCOPY WITH PROPOFOL N/A 11/28/2014   Procedure: COLONOSCOPY WITH PROPOFOL;  Surgeon: Manya Silvas, MD;  Location: [facility];  Service: Endoscopy;  Laterality: N/A;  . CORONARY ARTERY BYPASS GRAFT    . PERIPHERAL VASCULAR CATHETERIZATION  Left 01/18/2015   Procedure: A/V Shuntogram/Fistulagram;  Surgeon: Katha Cabal, MD;  Location: [place] [facility];  Service: Cardiovascular;  Laterality: Left;  . PERIPHERAL VASCULAR CATHETERIZATION Left 01/18/2015   Procedure: A/V Shunt Intervention;  Surgeon: Katha Cabal, MD;  Location: [place] [facility];  Service: Cardiovascular;  Laterality: Left;  . PERIPHERAL VASCULAR CATHETERIZATION N/A 08/10/2015   Procedure: Thrombectomy;  Surgeon: Algernon Huxley, MD;  Location: [facility]  INVASIVE [facility];  Service: Cardiovascular;  Laterality: N/A;  . PERIPHERAL VASCULAR CATHETERIZATION N/A 08/10/2015   Procedure: A/V Shuntogram/Fistulagram;  Surgeon: Algernon Huxley, MD;  Location: [place] [facility];  Service: Cardiovascular;  Laterality: N/A;  . PERIPHERAL VASCULAR CATHETERIZATION N/A 08/10/2015   Procedure: A/V Shunt Intervention;  Surgeon: Algernon Huxley, MD;  Location: [place] [facility];  Service: Cardiovascular;  Laterality: N/A;  . RENAL SHUNTS     No family history on file.  Allergies  Allergen Reactions  . Codeine Sulfate Nausea Only  . Contrast Media [Iodinated Diagnostic Agents] Hives  . Gadobutrol Hives  . Gadolinium Derivatives Hives  . Penicillins Hives and Itching    Has patient had a PCN reaction causing immediate rash, facial/tongue/throat swelling, SOB or lightheadedness with hypotension: No Has patient had a PCN reaction causing severe rash involving mucus membranes or skin necrosis: No Has patient had a PCN reaction that required hospitalization: No Has patient had a PCN reaction occurring within the last 10 years: No If all of the above answers are "NO", then may proceed with Cephalosporin use.   . Statins Other (See Comments)    muscle pain      Assessment & Plan:   Patient presents for a 108-month HDA follow-up. The patient underwent a duplex ultrasound of the AV access which was notable for a patent HeRO graft without any significant hemodynamic stenosis. Total flow  volume was 1482. Patient reports his hemodialysis doppler flow is stable as per dialysis center flow studies. The patient denies any issues with hemodialysis such as cannulation problems, increased bleeding, decrease in doppler flow or recirculation. The patient also denies any fistula skin breakdown, pain, edema, pallor or ulceration of the arm / hand.   1. End stage renal disease ([place]) - Stable Studies reviewed with patient. The patient is doing well and currently has adequate dialysis access. Duplex ultrasound of the AV access shows a patent access with no evidence of hemodynamically significant strictures or stenosis.  The patient should continue to have duplex ultrasounds of the dialysis access every six months. The patient was instructed to call the office in the interim if any issues with dialysis access / doppler flow, pain, edema, pallor, fistula skin breakdown or ulceration of the arm / hand occur. The patient expressed their understanding.  - VAS US DUPLEX DIALYSIS ACCESS (AVF,AVG); Future  2. Complication of vascular access for dialysis, sequela - Stable As above  - VAS US DUPLEX DIALYSIS ACCESS (AVF,AVG); Future  3. Mixed hyperlipidemia - Stable On appropriate medications Encouraged good control as its slows the progression of atherosclerotic disease  4. Hypertension secondary to other renal disorders - Stable On appropriate medications Encouraged good control as its slows the progression of atherosclerotic disease  Current Outpatient Medications on File Prior to Visit  Medication Sig Dispense Refill  . acetaminophen (TYLENOL) 500 MG tablet Tylenol Extra Strength 500 mg tablet  Take 2 tablets every 8 hours by oral route as needed.    [name] aspirin 81 MG tablet Take 81 mg by mouth daily.    . cinacalcet (SENSIPAR) 30 MG tablet Take 30 mg by mouth 3 (three) times a week. On dialysis days - Tues, Thurs, Sat    . DUREZOL 0.05 % EMUL Place 1 drop into both eyes 2 (two) times daily.   0  . latanoprost (XALATAN) 0.005 % ophthalmic solution Place 1 drop into both eyes 2 (two) times daily.     . pravastatin (PRAVACHOL) 20 MG  tablet Take 20 mg by mouth daily.    . timolol (TIMOPTIC) 0.5 % ophthalmic solution Administer 1 drop to both eyes daily.     [name] amitriptyline (ELAVIL) 25 MG tablet Take 25 mg by mouth.    . DiphenhydrAMINE HCl, Sleep, 50 MG tablet Take 1 tablet by mouth 30 minutes before CT scan    . isosorbide mononitrate (IMDUR) 30 MG 24 hr tablet Take 30 mg by mouth daily.    . metoprolol succinate (TOPROL-XL) 25 MG 24 hr tablet Take 25 mg by mouth daily.    [name] POLYETHYLENE GLYCOL 3350 PO polyethylene glycol 3350 17 gram/dose oral powder  Take 17 g every day by oral route.    . predniSONE (DELTASONE) 50 MG tablet Take 50 mg by mouth once.    . ranitidine (ZANTAC) 150 MG tablet Take 150 mg by mouth 2 (two) times daily.    . sevelamer carbonate (RENVELA) 800 MG tablet Take 1,600 mg by mouth 3 (three) times daily with meals.      No current facility-administered medications on file prior to visit.    There are no Patient Instructions on file for this visit. No follow-ups on file.  KIMBERLY A STEGMAYER, PA-C

## 2018-10-03 ENCOUNTER — Ambulatory Visit
Admission: EM | Admit: 2018-10-03 | Discharge: 2018-10-03 | Disposition: A | Discharge status: Home / Self Care | Payer: Medicare Other

## 2018-10-03 ENCOUNTER — Encounter: Payer: Self-pay | Admitting: Emergency Medicine

## 2018-10-03 ENCOUNTER — Other Ambulatory Visit: Payer: Self-pay

## 2018-10-03 DIAGNOSIS — H1132 Conjunctival hemorrhage, left eye: Secondary | ICD-10-CM | POA: Diagnosis not present

## 2018-10-03 NOTE — ED Triage Notes (Signed)
Patient c/o redness in her left eye that started yesterday.  Patient denies pain or drainage from the eye.

## 2018-10-03 NOTE — ED Provider Notes (Addendum)
[facility] ____________________________________________  Time seen: Approximately 4:48 PM  I have reviewed the triage vital signs and the nursing notes.   HISTORY  Chief Complaint Eye Problem (left)   HPI Kelli Nguyen is a 73 y.o. female presenting for evaluation of left eye redness that was noted yesterday evening by her daughter. Reports her daughter came over to her house last night and noticed the eye redness.  Patient states that she did not notice the redness herself.  Denies any eye pain, vision changes, foreign body sensation, eye discomfort at all.  Patient does report yesterday during the day prior to her daughter noticing the redness, she did have an episode of vomiting in which she states was from having acid reflux.  Denies other vomiting.  Denies any eye drainage or crusting.  Denies cough, congestion, fevers, swelling, chest pain, shortness of breath or other complaints.  Takes aspirin daily.  Not on other blood thinners.  Denies any injury.  History of cataract extraction and glaucoma.  [place]: Ophthalmology  Past Medical History:  Diagnosis Date  . Anemia   . Chronic cough   . Chronic kidney disease   . Coronary artery disease   . Diabetes mellitus without complication ([place])   . Dialysis patient ([place])    T-TH-SAT  . GERD (gastroesophageal reflux disease)   . Hypertension   . Neuropathy    hands  . Wears dentures    partial upper and lower    Patient Active Problem List   Diagnosis Date Noted  . CAD (coronary artery disease) 11/25/2016  . End stage renal disease ([place]) 03/17/2016  . Complication of vascular access for dialysis 03/17/2016  . Superior vena cava compression syndrome 03/17/2016  . Hypertension 03/17/2016  . Hyperlipidemia 03/17/2016    Past Surgical History:  Procedure Laterality Date  . A/V SHUNT INTERVENTION N/A 10/16/2016   Procedure: A/V Shunt Intervention;  Surgeon: Katha Cabal, MD;  Location: [place] [facility];   Service: Cardiovascular;  Laterality: N/A;  . A/V SHUNTOGRAM Left 10/16/2016   Procedure: A/V Shuntogram;  Surgeon: Katha Cabal, MD;  Location: [place] [facility];  Service: Cardiovascular;  Laterality: Left;  . ABDOMINAL HYSTERECTOMY    . CARPAL TUNNEL RELEASE Left 09/27/2015   Procedure: CARPAL TUNNEL RELEASE;  Surgeon: Leanor Kail, MD;  Location: [facility];  Service: Orthopedics;  Laterality: Left;  . CATARACT EXTRACTION W/PHACO Right 09/23/2016   Procedure: CATARACT EXTRACTION PHACO AND INTRAOCULAR LENS PLACEMENT (IOC) complicated diabetic right;  Surgeon: Ronnell Freshwater, MD;  Location: [place];  Service: Ophthalmology;  Laterality: Right;  Diabetic - diet controlled Potassium draw before surgery  . CATARACT EXTRACTION W/PHACO Left 01/20/2017   Procedure: CATARACT EXTRACTION PHACO AND INTRAOCULAR LENS PLACEMENT (IOC) LEFT DIABETIC;  Surgeon: Eulogio Bear, MD;  Location: [place];  Service: Ophthalmology;  Laterality: Left;  Diabetic - diet controlled Potassium draw prior to procedure  8:00 ARRIVAL  . COLONOSCOPY    . COLONOSCOPY WITH PROPOFOL N/A 11/28/2014   Procedure: COLONOSCOPY WITH PROPOFOL;  Surgeon: Manya Silvas, MD;  Location: [facility];  Service: Endoscopy;  Laterality: N/A;  . CORONARY ARTERY BYPASS GRAFT    . PERIPHERAL VASCULAR CATHETERIZATION Left 01/18/2015   Procedure: A/V Shuntogram/Fistulagram;  Surgeon: Katha Cabal, MD;  Location: [place] [facility];  Service: Cardiovascular;  Laterality: Left;  . PERIPHERAL VASCULAR CATHETERIZATION Left 01/18/2015   Procedure: A/V Shunt Intervention;  Surgeon: Katha Cabal, MD;  Location: [place]  [facility];  Service: Cardiovascular;  Laterality: Left;  . PERIPHERAL VASCULAR CATHETERIZATION N/A 08/10/2015   Procedure: Thrombectomy;  Surgeon: Algernon Huxley, MD;  Location: [place] [facility];  Service: Cardiovascular;  Laterality: N/A;  . PERIPHERAL VASCULAR CATHETERIZATION  N/A 08/10/2015   Procedure: A/V Shuntogram/Fistulagram;  Surgeon: Algernon Huxley, MD;  Location: [place] [facility];  Service: Cardiovascular;  Laterality: N/A;  . PERIPHERAL VASCULAR CATHETERIZATION N/A 08/10/2015   Procedure: A/V Shunt Intervention;  Surgeon: Algernon Huxley, MD;  Location: [place] [facility];  Service: Cardiovascular;  Laterality: N/A;  . RENAL SHUNTS       No current facility-administered medications for this encounter.   Current Outpatient Medications:  .  aspirin 81 MG tablet, Take 81 mg by mouth daily., Disp: , Rfl:  .  cinacalcet (SENSIPAR) 30 MG tablet, Take 30 mg by mouth 3 (three) times a week. On dialysis days - Tues, Thurs, Sat, Disp: , Rfl:  .  DiphenhydrAMINE HCl, Sleep, 50 MG tablet, Take 1 tablet by mouth 30 minutes before CT scan, Disp: , Rfl:  .  latanoprost (XALATAN) 0.005 % ophthalmic solution, Place 1 drop into both eyes 2 (two) times daily. , Disp: , Rfl:  .  pravastatin (PRAVACHOL) 20 MG tablet, Take 20 mg by mouth daily., Disp: , Rfl:  .  timolol (TIMOPTIC) 0.5 % ophthalmic solution, Administer 1 drop to both eyes daily. , Disp: , Rfl:  .  acetaminophen (TYLENOL) 500 MG tablet, Tylenol Extra Strength 500 mg tablet  Take 2 tablets every 8 hours by oral route as needed., Disp: , Rfl:  .  amitriptyline (ELAVIL) 25 MG tablet, Take 25 mg by mouth., Disp: , Rfl:  .  DUREZOL 0.05 % EMUL, Place 1 drop into both eyes 2 (two) times daily., Disp: , Rfl: 0 .  isosorbide mononitrate (IMDUR) 30 MG 24 hr tablet, Take 30 mg by mouth daily., Disp: , Rfl:  .  metoprolol succinate (TOPROL-XL) 25 MG 24 hr tablet, Take 25 mg by mouth daily., Disp: , Rfl:  .  POLYETHYLENE GLYCOL 3350 PO, polyethylene glycol 3350 17 gram/dose oral powder  Take 17 g every day by oral route., Disp: , Rfl:  .  predniSONE (DELTASONE) 50 MG tablet, Take 50 mg by mouth once., Disp: , Rfl:  .  ranitidine (ZANTAC) 150 MG tablet, Take 150 mg by mouth 2 (two) times daily., Disp: , Rfl:  .  sevelamer  carbonate (RENVELA) 800 MG tablet, Take 1,600 mg by mouth 3 (three) times daily with meals. , Disp: , Rfl:   Allergies Codeine sulfate; Contrast media [iodinated diagnostic agents]; Gadobutrol; Gadolinium derivatives; Penicillins; and Statins  History reviewed. No pertinent family history.  Social History Social History   Tobacco Use  . Smoking status: Never Smoker  . Smokeless tobacco: Never Used  Substance Use Topics  . Alcohol use: No  . Drug use: No    Review of Systems Constitutional: No fever/chills Eyes: No visual changes.  Positive left eye redness. ENT: No sore throat. Cardiovascular: Denies chest pain. Respiratory: Denies shortness of breath. Gastrointestinal: No abdominal pain.  No nausea, no vomiting.  No diarrhea.  No constipation. Genitourinary: Negative for dysuria. Musculoskeletal: Negative for back pain. Skin: Negative for rash. Neurological: Negative for headaches, focal weakness or numbness.   ____________________________________________   PHYSICAL EXAM:  VITAL SIGNS: ED Triage Vitals [10/03/18 1513]  Enc Vitals Group     BP      Pulse  Resp      Temp      Temp src      SpO2      Weight 170 lb (77.1 kg)     Height 4\' 11"  (1.499 m)     Head Circumference      Peak Flow      Pain Score 0     Pain Loc      Pain Edu?      Excl. in [place]?    Vitals:   10/03/18 1513 10/03/18 1657  BP:  129/63  Pulse:  67  Resp:  18  Temp:  98 F (36.7 C)  TempSrc:  Oral  SpO2:  100%  Weight: 170 lb (77.1 kg)   Height: 4\' 11"  (1.499 m)        Visual Acuity  Right Eye Distance: 20/20 uncorrected Left Eye Distance: 20/25 uncorrected Bilateral Distance: 20/25 uncorrected  Right Eye Near:   Left Eye Near:    Bilateral Near:      Constitutional: Alert and oriented. Well appearing and in no acute distress. Eyes: Normal right conjunctiva.  Left lateral conjunctive flat focal area of demarcated erythema.  No drainage bilaterally.  No foreign body  visualized bilaterally.  PERRL. EOMI. no surrounding tenderness, swelling or erythema bilaterally. ENT      Head: Normocephalic and atraumatic.      Nose: No congestion Cardiovascular: Normal rate, regular rhythm. Grossly normal heart sounds.  Good peripheral circulation. Respiratory: Normal respiratory effort without tachypnea nor retractions. Breath sounds are clear and equal bilaterally. No wheezes, rales, rhonchi. Musculoskeletal: Steady gait Neurologic:  Normal speech and language.  Skin:  Skin is warm, dry and intact. No rash noted. Psychiatric: Mood and affect are normal. Speech and behavior are normal. Patient exhibits appropriate insight and judgment   ___________________________________________   LABS (all labs ordered are listed, but only abnormal results are displayed)  Labs Reviewed - No data to display   PROCEDURES Procedures    INITIAL IMPRESSION / ASSESSMENT AND PLAN / ED COURSE  Pertinent labs & imaging results that were available during my care of the patient were reviewed by me and considered in my medical decision making (see chart for details).  Very well-appearing patient.  No acute distress.  Presenting for left eye redness.  Patient states asymptomatic.  Denies any vision changes, pain or sensation changes.  Suspect left sub-conjunctival hemorrhage after patient had vomiting episode yesterday afternoon.  Recommend for patient to follow-up with ophthalmology in 2 days for recheck.  Discussed supportive care and monitoring.   Discussed follow up with Primary care physician this week. Discussed follow up and return parameters including no resolution or any worsening concerns. Patient verbalized understanding and agreed to plan.   ____________________________________________   FINAL CLINICAL IMPRESSION(S) / ED DIAGNOSES  Final diagnoses:  Subconjunctival hemorrhage of left eye     ED Discharge Orders    None       Note: This dictation was prepared  with Dragon dictation along with smaller phrase technology. Any transcriptional errors that result from this process are unintentional.         Marylene Land, NP 10/03/18 Duayne Cal, NP 10/03/18 [phone]

## 2018-10-03 NOTE — Discharge Instructions (Addendum)
Rest. Monitor.   Follow up with ophthalmology in 2 days as discussed. Call Monday morning to schedule.   Follow up with your primary care physician this week as needed. Return to Urgent care for new or worsening concerns.

## 2018-12-02 ENCOUNTER — Ambulatory Visit (INDEPENDENT_AMBULATORY_CARE_PROVIDER_SITE_OTHER): Payer: Medicare Other | Admitting: Vascular Surgery

## 2018-12-02 ENCOUNTER — Encounter (INDEPENDENT_AMBULATORY_CARE_PROVIDER_SITE_OTHER): Payer: PRIVATE HEALTH INSURANCE

## 2019-01-12 ENCOUNTER — Other Ambulatory Visit: Payer: Self-pay

## 2019-01-12 ENCOUNTER — Encounter: Payer: Self-pay | Admitting: *Deleted

## 2019-01-12 ENCOUNTER — Other Ambulatory Visit
Admission: RE | Admit: 2019-01-12 | Discharge: 2019-01-12 | Disposition: A | Discharge status: Home / Self Care | Payer: Medicare Other | Source: Ambulatory Visit | Attending: Vascular Surgery | Admitting: Vascular Surgery

## 2019-01-12 ENCOUNTER — Ambulatory Visit
Admission: RE | Admit: 2019-01-12 | Discharge: 2019-01-12 | Disposition: A | Discharge status: Home / Self Care | Payer: Medicare Other | Attending: Vascular Surgery | Admitting: Vascular Surgery

## 2019-01-12 ENCOUNTER — Other Ambulatory Visit (INDEPENDENT_AMBULATORY_CARE_PROVIDER_SITE_OTHER): Payer: Self-pay | Admitting: Nurse Practitioner

## 2019-01-12 ENCOUNTER — Encounter
Admission: RE | Disposition: A | Discharge status: Home / Self Care | Payer: Self-pay | Source: Home / Self Care | Attending: Vascular Surgery

## 2019-01-12 DIAGNOSIS — E1122 Type 2 diabetes mellitus with diabetic chronic kidney disease: Secondary | ICD-10-CM | POA: Diagnosis not present

## 2019-01-12 DIAGNOSIS — Z885 Allergy status to narcotic agent status: Secondary | ICD-10-CM | POA: Insufficient documentation

## 2019-01-12 DIAGNOSIS — I2510 Atherosclerotic heart disease of native coronary artery without angina pectoris: Secondary | ICD-10-CM | POA: Diagnosis not present

## 2019-01-12 DIAGNOSIS — T82868A Thrombosis of vascular prosthetic devices, implants and grafts, initial encounter: Secondary | ICD-10-CM | POA: Diagnosis not present

## 2019-01-12 DIAGNOSIS — Z992 Dependence on renal dialysis: Secondary | ICD-10-CM

## 2019-01-12 DIAGNOSIS — E1140 Type 2 diabetes mellitus with diabetic neuropathy, unspecified: Secondary | ICD-10-CM | POA: Diagnosis not present

## 2019-01-12 DIAGNOSIS — I120 Hypertensive chronic kidney disease with stage 5 chronic kidney disease or end stage renal disease: Secondary | ICD-10-CM | POA: Diagnosis not present

## 2019-01-12 DIAGNOSIS — Z888 Allergy status to other drugs, medicaments and biological substances status: Secondary | ICD-10-CM | POA: Insufficient documentation

## 2019-01-12 DIAGNOSIS — K219 Gastro-esophageal reflux disease without esophagitis: Secondary | ICD-10-CM | POA: Diagnosis not present

## 2019-01-12 DIAGNOSIS — Y841 Kidney dialysis as the cause of abnormal reaction of the patient, or of later complication, without mention of misadventure at the time of the procedure: Secondary | ICD-10-CM | POA: Insufficient documentation

## 2019-01-12 DIAGNOSIS — Z20828 Contact with and (suspected) exposure to other viral communicable diseases: Secondary | ICD-10-CM | POA: Insufficient documentation

## 2019-01-12 DIAGNOSIS — Z880 Allergy status to penicillin: Secondary | ICD-10-CM | POA: Diagnosis not present

## 2019-01-12 DIAGNOSIS — N186 End stage renal disease: Secondary | ICD-10-CM

## 2019-01-12 HISTORY — PX: PERIPHERAL VASCULAR THROMBECTOMY: CATH118306

## 2019-01-12 LAB — GLUCOSE, CAPILLARY
Glucose-Capillary: 88 mg/dL (ref 70–99)
Glucose-Capillary: 92 mg/dL (ref 70–99)

## 2019-01-12 LAB — SARS CORONAVIRUS 2 BY RT PCR (HOSPITAL ORDER, PERFORMED IN ~~LOC~~ HOSPITAL LAB): SARS Coronavirus 2: NEGATIVE

## 2019-01-12 LAB — POTASSIUM (ARMC VASCULAR LAB ONLY): Potassium (ARMC vascular lab): 4.9 (ref 3.5–5.1)

## 2019-01-12 SURGERY — PERIPHERAL VASCULAR THROMBECTOMY
Anesthesia: Moderate Sedation | Site: Arm Upper | Laterality: Left

## 2019-01-12 MED ORDER — MIDAZOLAM HCL 2 MG/2ML IJ SOLN
INTRAMUSCULAR | Status: DC | PRN
  Administered 2019-01-12: 2 mg via INTRAVENOUS
  Administered 2019-01-12: 1 mg via INTRAVENOUS

## 2019-01-12 MED ORDER — FENTANYL CITRATE (PF) 100 MCG/2ML IJ SOLN
INTRAMUSCULAR | Status: AC
  Filled 2019-01-12: qty 4

## 2019-01-12 MED ORDER — DIPHENHYDRAMINE HCL 50 MG/ML IJ SOLN
50.0000 mg | Freq: Once | INTRAMUSCULAR | Status: AC | PRN
  Administered 2019-01-12: 13:00:00 50 mg via INTRAVENOUS

## 2019-01-12 MED ORDER — MIDAZOLAM HCL 5 MG/5ML IJ SOLN
INTRAMUSCULAR | Status: AC
  Filled 2019-01-12: qty 5

## 2019-01-12 MED ORDER — CLINDAMYCIN PHOSPHATE 300 MG/50ML IV SOLN
INTRAVENOUS | Status: AC
  Administered 2019-01-12: 300 mg via INTRAVENOUS
  Filled 2019-01-12: qty 50

## 2019-01-12 MED ORDER — FAMOTIDINE 20 MG PO TABS
ORAL_TABLET | ORAL | Status: AC
  Administered 2019-01-12: 40 mg via ORAL
  Filled 2019-01-12: qty 2

## 2019-01-12 MED ORDER — HYDROMORPHONE HCL 1 MG/ML IJ SOLN: 1.0000 mg | Freq: Once | INTRAMUSCULAR | Status: DC | PRN

## 2019-01-12 MED ORDER — FENTANYL CITRATE (PF) 100 MCG/2ML IJ SOLN
INTRAMUSCULAR | Status: DC | PRN
  Administered 2019-01-12 (×2): 50 ug via INTRAVENOUS

## 2019-01-12 MED ORDER — SODIUM CHLORIDE 0.9 % IV SOLN
INTRAVENOUS | Status: DC
  Administered 2019-01-12: 13:00:00 via INTRAVENOUS

## 2019-01-12 MED ORDER — HEPARIN SODIUM (PORCINE) 1000 UNIT/ML IJ SOLN
INTRAMUSCULAR | Status: AC
  Filled 2019-01-12: qty 1

## 2019-01-12 MED ORDER — ONDANSETRON HCL 4 MG/2ML IJ SOLN: 4.0000 mg | Freq: Four times a day (QID) | INTRAMUSCULAR | Status: DC | PRN

## 2019-01-12 MED ORDER — MIDAZOLAM HCL 2 MG/ML PO SYRP: 8.0000 mg | ORAL_SOLUTION | Freq: Once | ORAL | Status: DC | PRN

## 2019-01-12 MED ORDER — METHYLPREDNISOLONE SODIUM SUCC 125 MG IJ SOLR
125.0000 mg | Freq: Once | INTRAMUSCULAR | Status: AC | PRN
  Administered 2019-01-12: 13:00:00 125 mg via INTRAVENOUS

## 2019-01-12 MED ORDER — BACITRACIN-NEOMYCIN-POLYMYXIN 400-5-5000 EX OINT
TOPICAL_OINTMENT | CUTANEOUS | Status: AC
  Filled 2019-01-12: qty 4

## 2019-01-12 MED ORDER — METHYLPREDNISOLONE SODIUM SUCC 125 MG IJ SOLR
INTRAMUSCULAR | Status: AC
  Administered 2019-01-12: 125 mg via INTRAVENOUS
  Filled 2019-01-12: qty 2

## 2019-01-12 MED ORDER — CLINDAMYCIN PHOSPHATE 300 MG/50ML IV SOLN
300.0000 mg | Freq: Once | INTRAVENOUS | Status: AC
  Administered 2019-01-12: 13:00:00 300 mg via INTRAVENOUS

## 2019-01-12 MED ORDER — FAMOTIDINE 20 MG PO TABS
40.0000 mg | ORAL_TABLET | Freq: Once | ORAL | Status: AC | PRN
  Administered 2019-01-12: 13:00:00 40 mg via ORAL

## 2019-01-12 MED ORDER — IODIXANOL 320 MG/ML IV SOLN
INTRAVENOUS | Status: DC | PRN
  Administered 2019-01-12: 14:00:00 5 mL via INTRAVENOUS

## 2019-01-12 MED ORDER — HEPARIN SODIUM (PORCINE) 1000 UNIT/ML IJ SOLN
INTRAMUSCULAR | Status: DC | PRN
  Administered 2019-01-12: 4000 [IU] via INTRAVENOUS

## 2019-01-12 MED ORDER — DIPHENHYDRAMINE HCL 50 MG/ML IJ SOLN
INTRAMUSCULAR | Status: AC
  Administered 2019-01-12: 13:00:00 50 mg via INTRAVENOUS
  Filled 2019-01-12: qty 1

## 2019-01-12 SURGICAL SUPPLY — 20 items
CATH BEACON 5 .035 40 KMP TP (CATHETERS) IMPLANT
CATH BEACON 5 .038 40 KMP TP (CATHETERS) ×2
CATH PALINDROME RT-P 15FX55CM (CATHETERS) ×2 IMPLANT
CATH PALINDROME-P 44CM KIT (CATHETERS) ×2
COVER PROBE U/S 5X48 (MISCELLANEOUS) ×4 IMPLANT
DERMABOND ADVANCED (GAUZE/BANDAGES/DRESSINGS) ×2
DERMABOND ADVANCED .7 DNX12 (GAUZE/BANDAGES/DRESSINGS) IMPLANT
DRAPE BRACHIAL (DRAPES) ×2 IMPLANT
DRAPE FEMORAL ANGIO W/ POUCH (DRAPES) ×2 IMPLANT
GUIDEWIRE SUPER STIFF .035X180 (WIRE) ×2 IMPLANT
KIT CATH CHRNC PALINDROME PRCS (CATHETERS) IMPLANT
KIT THROMB PERC PTD (MISCELLANEOUS) ×2 IMPLANT
NDL ENTRY 21GA 7CM ECHOTIP (NEEDLE) IMPLANT
NEEDLE ENTRY 21GA 7CM ECHOTIP (NEEDLE) ×3 IMPLANT
PACK ANGIOGRAPHY (CUSTOM PROCEDURE TRAY) ×3 IMPLANT
SET INTRO CAPELLA COAXIAL (SET/KITS/TRAYS/PACK) ×2 IMPLANT
SHEATH BRITE TIP 6FRX5.5 (SHEATH) ×4 IMPLANT
SUT MNCRL AB 4-0 PS2 18 (SUTURE) ×2 IMPLANT
SUT SILK 0 FSL (SUTURE) ×2 IMPLANT
WIRE J 3MM .035X145CM (WIRE) ×2 IMPLANT

## 2019-01-12 NOTE — OR Nursing (Signed)
Gauze and tape pressure dressing applied to right leg perm cath. Dr Delana Meyer and Kaleen Mask in [place] and will need to come assess site prior to discharge. Family and pt aware of cause of delay in discharge. Site stable but when pressure relased site oozes prior to dressing.

## 2019-01-12 NOTE — OR Nursing (Signed)
When dressing removed from perm cath By Darnelle Maffucci ( [place]) pt started bleeding around catheter. Manual hold and Dr Delana Meyer notified.

## 2019-01-12 NOTE — OR Nursing (Signed)
Dr Delana Meyer put sutures on perm catheter site and dermabond placed on suture site above perm catheters, bleeding/ooze stopped

## 2019-01-12 NOTE — H&P (Signed)
[facility] Admission History & Physical  MRN : [id number]  Kelli Nguyen is a 73 y.o. (08/30/1945) female who presents with chief complaint of clotted dialysis access.  History of Present Illness: I am asked to evaluate the patient by the dialysis center. The patient was sent here because they were unable to cannulate the left arm hero graft this morning. Furthermore the Center states there is no thrill or bruit. The patient states this is the first dialysis run to be missed. This problem is acute in onset and has been present for approximately 2 days. The patient is unaware of any other change.  Patient denies pain or tenderness overlying the access.  There is no pain with dialysis.  The patient denies hand pain or finger pain consistent with steal syndrome.   There have not been any past interventions or declots of this access.  The patient is not chronically hypotensive on dialysis.  Current Facility-Administered Medications  Medication Dose Route Frequency Provider Last Rate Last Dose  . fentaNYL (SUBLIMAZE) 100 MCG/2ML injection           . heparin 1000 UNIT/ML injection           . midazolam (VERSED) 5 MG/5ML injection           . 0.9 %  sodium chloride infusion   Intravenous Continuous Kris Hartmann, NP 10 mL/hr at 01/12/19 1246    . clindamycin (CLEOCIN) IVPB 300 mg  300 mg Intravenous Once Eulogio Ditch E, NP      . HYDROmorphone (DILAUDID) injection 1 mg  1 mg Intravenous Once PRN Kris Hartmann, NP      . midazolam (VERSED) 2 MG/ML syrup 8 mg  8 mg Oral Once PRN Kris Hartmann, NP      . ondansetron [facility]) injection 4 mg  4 mg Intravenous Q6H PRN Kris Hartmann, NP        Past Medical History:  Diagnosis Date  . Anemia   . Chronic cough   . Chronic kidney disease   . Coronary artery disease   . Diabetes mellitus without complication ([place])   . Dialysis patient ([place])    T-TH-SAT  . GERD (gastroesophageal reflux disease)   . Hypertension    . Neuropathy    hands  . Wears dentures    partial upper and lower    Past Surgical History:  Procedure Laterality Date  . A/V SHUNT INTERVENTION N/A 10/16/2016   Procedure: A/V Shunt Intervention;  Surgeon: Katha Cabal, MD;  Location: [place] [facility];  Service: Cardiovascular;  Laterality: N/A;  . A/V SHUNTOGRAM Left 10/16/2016   Procedure: A/V Shuntogram;  Surgeon: Katha Cabal, MD;  Location: [place] [facility];  Service: Cardiovascular;  Laterality: Left;  . ABDOMINAL HYSTERECTOMY    . CARPAL TUNNEL RELEASE Left 09/27/2015   Procedure: CARPAL TUNNEL RELEASE;  Surgeon: Leanor Kail, MD;  Location: [facility];  Service: Orthopedics;  Laterality: Left;  . CATARACT EXTRACTION W/PHACO Right 09/23/2016   Procedure: CATARACT EXTRACTION PHACO AND INTRAOCULAR LENS PLACEMENT (IOC) complicated diabetic right;  Surgeon: Ronnell Freshwater, MD;  Location: [place];  Service: Ophthalmology;  Laterality: Right;  Diabetic - diet controlled Potassium draw before surgery  . CATARACT EXTRACTION W/PHACO Left 01/20/2017   Procedure: CATARACT EXTRACTION PHACO AND INTRAOCULAR LENS PLACEMENT (IOC) LEFT DIABETIC;  Surgeon: Eulogio Bear, MD;  Location: [place];  Service: Ophthalmology;  Laterality: Left;  Diabetic - diet  controlled Potassium draw prior to procedure  8:00 ARRIVAL  . COLONOSCOPY    . COLONOSCOPY WITH PROPOFOL N/A 11/28/2014   Procedure: COLONOSCOPY WITH PROPOFOL;  Surgeon: Manya Silvas, MD;  Location: [facility];  Service: Endoscopy;  Laterality: N/A;  . CORONARY ARTERY BYPASS GRAFT    . PERIPHERAL VASCULAR CATHETERIZATION Left 01/18/2015   Procedure: A/V Shuntogram/Fistulagram;  Surgeon: Katha Cabal, MD;  Location: [place] [facility];  Service: Cardiovascular;  Laterality: Left;  . PERIPHERAL VASCULAR CATHETERIZATION Left 01/18/2015   Procedure: A/V Shunt Intervention;  Surgeon: Katha Cabal, MD;  Location: [place] [facility];  Service: Cardiovascular;  Laterality: Left;  . PERIPHERAL VASCULAR CATHETERIZATION N/A 08/10/2015   Procedure: Thrombectomy;  Surgeon: Algernon Huxley, MD;  Location: [place] [facility];  Service: Cardiovascular;  Laterality: N/A;  . PERIPHERAL VASCULAR CATHETERIZATION N/A 08/10/2015   Procedure: A/V Shuntogram/Fistulagram;  Surgeon: Algernon Huxley, MD;  Location: [place] [facility];  Service: Cardiovascular;  Laterality: N/A;  . PERIPHERAL VASCULAR CATHETERIZATION N/A 08/10/2015   Procedure: A/V Shunt Intervention;  Surgeon: Algernon Huxley, MD;  Location: [place] [facility];  Service: Cardiovascular;  Laterality: N/A;  . RENAL SHUNTS      Social History Social History   Tobacco Use  . Smoking status: Never Smoker  . Smokeless tobacco: Never Used  Substance Use Topics  . Alcohol use: No  . Drug use: No    Family History History reviewed. No pertinent family history.  No family history of bleeding or clotting disorders, autoimmune disease or porphyria  Allergies  Allergen Reactions  . Codeine Sulfate Nausea Only  . Contrast Media [Iodinated Diagnostic Agents] Hives  . Gadobutrol Hives  . Gadolinium Derivatives Hives  . Penicillins Hives and Itching    Has patient had a PCN reaction causing immediate rash, facial/tongue/throat swelling, SOB or lightheadedness with hypotension: No Has patient had a PCN reaction causing severe rash involving mucus membranes or skin necrosis: No Has patient had a PCN reaction that required hospitalization: No Has patient had a PCN reaction occurring within the last 10 years: No If all of the above answers are "NO", then may proceed with Cephalosporin use.   . Statins Other (See Comments)    muscle pain     REVIEW OF SYSTEMS (Negative unless checked)  Constitutional: [] Weight loss  [] Fever  [] Chills Cardiac: [] Chest pain   [] Chest pressure   [] Palpitations   [] Shortness of breath when laying flat   [] Shortness of breath at rest   [x] Shortness of  breath with exertion. Vascular:  [] Pain in legs with walking   [] Pain in legs at rest   [] Pain in legs when laying flat   [] Claudication   [] Pain in feet when walking  [] Pain in feet at rest  [] Pain in feet when laying flat   [] History of DVT   [] Phlebitis   [] Swelling in legs   [] Varicose veins   [] Non-healing ulcers Pulmonary:   [] Uses home oxygen   [] Productive cough   [] Hemoptysis   [] Wheeze  [] COPD   [] Asthma Neurologic:  [] Dizziness  [] Blackouts   [] Seizures   [] History of stroke   [] History of TIA  [] Aphasia   [] Temporary blindness   [] Dysphagia   [] Weakness or numbness in arms   [] Weakness or numbness in legs Musculoskeletal:  [] Arthritis   [] Joint swelling   [] Joint pain   [] Low back pain Hematologic:  [] Easy bruising  [] Easy bleeding   [] Hypercoagulable state   [] Anemic  [] Hepatitis  Gastrointestinal:  [] Blood in stool   [] Vomiting blood  [] Gastroesophageal reflux/heartburn   [] Difficulty swallowing. Genitourinary:  [x] Chronic kidney disease   [] Difficult urination  [] Frequent urination  [] Burning with urination   [] Blood in urine Skin:  [] Rashes   [] Ulcers   [] Wounds Psychological:  [] History of anxiety   []  History of major depression.  Physical Examination  Vitals:   01/12/19 1220 01/12/19 1304  BP: 131/60   Pulse: 65   Temp: 97.7 F (36.5 C)   TempSrc: Oral   SpO2: 99% 97%  Weight: 76.2 kg   Height: 4\' 11"  (1.499 m)    Body mass index is 33.93 kg/m. Gen: WD/WN, NAD Head: [place]/AT, No temporalis wasting. Prominent temp pulse not noted. Ear/Nose/Throat: Hearing grossly intact, nares w/o erythema or drainage, oropharynx w/o Erythema/Exudate,  Eyes: Conjunctiva clear, sclera non-icteric Neck: Trachea midline.  No JVD.  Pulmonary:  Good air movement, respirations not labored, no use of accessory muscles.  Cardiac: RRR, normal S1, S2. Vascular: Left arm hero graft no thrill no bruit  Vessel Right Left  Radial Palpable Palpable  Ulnar Not Palpable Not Palpable  Brachial  Palpable Palpable  Carotid Palpable, without bruit Palpable, without bruit  Gastrointestinal: soft, non-tender/non-distended. No guarding/reflex.  Musculoskeletal: M/S 5/5 throughout.  Extremities without ischemic changes.  No deformity or atrophy.  Neurologic: Sensation grossly intact in extremities.  Symmetrical.  Speech is fluent. Motor exam as listed above. Psychiatric: Judgment intact, Mood & affect appropriate for pt's clinical situation. Dermatologic: No rashes or ulcers noted.  No cellulitis or open wounds. Lymph : No Cervical, Axillary, or Inguinal lymphadenopathy.   CBC Lab Results  Component Value Date   WBC 8.3 09/26/2015   HGB 13.3 09/27/2015   HCT 39.0 09/27/2015   MCV 81.1 09/26/2015   PLT 135 (L) 09/26/2015    BMET    Component Value Date/Time   NA 137 09/27/2015 0900   NA 135 (L) 03/29/2013 1409   K 4.7 01/20/2017 0815   K 4.5 03/29/2013 1409   CL 99 (L) 09/26/2015 1517   CL 97 (L) 03/29/2013 1409   CO2 32 09/26/2015 1517   CO2 27 03/29/2013 1409   GLUCOSE 102 (H) 09/27/2015 0900   GLUCOSE 97 03/29/2013 1409   BUN 28 (H) 09/26/2015 1517   BUN 48 (H) 03/29/2013 1409   CREATININE 7.13 (H) 09/26/2015 1517   CREATININE 12.06 (H) 03/29/2013 1409   CALCIUM 8.5 (L) 09/26/2015 1517   CALCIUM 8.8 03/29/2013 1409   GFRNONAA 5 (L) 09/26/2015 1517   GFRNONAA 3 (L) 03/29/2013 1409   GFRAA 6 (L) 09/26/2015 1517   GFRAA 3 (L) 03/29/2013 1409   CrCl cannot be calculated (Patient's most recent lab result is older than the maximum 21 days allowed.).  COAG No results found for: INR, PROTIME  Radiology No results found.  Assessment/Plan 1.  Complication dialysis device with thrombosis AV access:  Patient's left arm dialysis access is thrombosed. The patient will undergo thrombectomy using interventional techniques.  The risks and benefits were described to the patient.  All questions were answered.  The patient agrees to proceed with angiography and intervention.  Potassium will be drawn to ensure that it is an appropriate level prior to performing thrombectomy. 2.  End-stage renal disease requiring hemodialysis:  Patient will continue dialysis therapy without further interruption if a successful thrombectomy is not achieved then catheter will be placed. Dialysis has already been arranged since the patient missed their previous session 3.  Hypertension:  Patient will  continue medical management; nephrology is following no changes in oral medications. 4. Diabetes mellitus:  Glucose will be monitored and oral medications been held this morning once the patient has undergone the patient's procedure po intake will be reinitiated and again Accu-Cheks will be used to assess the blood glucose level and treat as needed. The patient will be restarted on the patient's usual hypoglycemic regime 5.  Coronary artery disease:  EKG will be monitored. Nitrates will be used if needed. The patient's oral cardiac medications will be continued.    Hortencia Pilar, MD  01/12/2019 1:07 PM

## 2019-01-12 NOTE — Discharge Instructions (Signed)
Tunneled Catheter Insertion, Care After °This sheet gives you information about how to care for yourself after your procedure. Your health care provider may also give you more specific instructions. If you have problems or questions, contact your health care provider. °What can I expect after the procedure? °After the procedure, it is common to have: °· Some mild redness, bruising, swelling, and pain around your catheter site. °· A small amount of blood or clear fluid coming from your incisions. °Follow these instructions at home: °Incision care ° °· Follow instructions from your health care provider about how to take care of your incisions. Make sure you: °? Wash your hands with soap and water before and after you change your bandages (dressings). If soap and water are not available, use hand sanitizer. °? Change your dressings as told by your health care provider. Wash the area around your incisions with a germ-killing (antiseptic) solution when you change your dressings. °? Leave stitches (sutures), skin glue, or adhesive strips in place. These skin closures may need to stay in place for 2 weeks or longer. If adhesive strip edges start to loosen and curl up, you may trim the loose edges. Do not remove adhesive strips completely unless your health care provider tells you to do that. °· Keep your dressings clean and dry. °· Check your incision areas every day for signs of infection. Check for: °? More redness, swelling, or pain. °? More fluid or blood. °? Warmth. °? Pus or a bad smell. °Catheter care ° °· Wash your hands with soap and water before and after caring for your catheter. If soap and water are not available, use hand sanitizer. °· Keep your catheter site clean and dry. °· Apply an antibiotic ointment to your catheter site as told by your health care provider. °· Flush your catheter as told by your health care provider. This helps prevent it from becoming clogged. °· Do not open the caps on the ends of  the catheter. °· Do not pull on your catheter. °Medicines °· Take over-the-counter and prescription medicines only as told by your health care provider. °· If you were prescribed an antibiotic medicine, take it as told by your health care provider. Do not stop taking the antibiotic even if you start to feel better. °Activity °· Return to your normal activities as told by your health care provider. Ask your health care provider what activities are safe for you. °· Follow any other activity restrictions as instructed by your health care provider. °· Do not lift anything that is heavier than 10 lb (4.5 kg), or the limit that you are told, until your health care provider says that it is safe. °Driving °· Do not drive until your health care provider approves. °· Ask your health care provider if the medicine prescribed to you requires you to avoid driving or using heavy machinery. °General instructions °· Follow your health care provider's specific instructions for the type of catheter that you have. °· Do not take baths, swim, or use a hot tub until your health care provider approves. Ask your health care provider if you may take showers. °· Keep all follow-up visits as told by your health care provider. This is important. °Contact a health care provider if: °· You feel unusually weak or nauseous. °· You have more redness, swelling, or pain at your incisions or around the area where your catheter is inserted. °· Your catheter is not working properly. °· You are unable to flush your catheter. °  Get help right away if:  Your catheter develops a hole or it breaks.  You have pain or swelling when fluids or medicines are being given through the catheter.  Fluid is leaking from the catheter, under the dressing, or around the dressing.  Your catheter comes loose or gets pulled completely out. If this happens, press on your catheter site firmly with a clean cloth until you can get medical help.  You have swelling in  your shoulder, neck, chest, or face.  You have chest pain or difficulty breathing.  You feel dizzy or light-headed.  You have pus or a bad smell coming from your catheter site.  You have a fever or chills.  Your catheter site feels warm to the touch.  You develop bleeding from your catheter or your insertion site, and your bleeding does not stop. Summary  After the procedure, it is common to have mild redness, swelling, and pain around your catheter site.  Return to your normal activities as told by your health care provider. Ask your health care provider what activities are safe for you.  Follow your health care provider's specific instructions for the type of catheter that you have.  Keep your catheter site and your dressings clean and dry.  Contact a health care provider if your catheter is not working properly. Get help right away if you have chest pain, fever, or difficulty breathing. This information is not intended to replace advice given to you by your health care provider. Make sure you discuss any questions you have with your health care provider. Document Released: 04/08/2012 Document Revised: 04/14/2018 Document Reviewed: 04/14/2018 Elsevier Patient Education  2020 [employer] neopsporin or antibiotic ointment to left upper arm open wound and cover with dressing/bandaid, twice daily.    Dialysis Fistulogram, Care After This sheet gives you information about how to care for yourself after your procedure. Your health care provider may also give you more specific instructions. If you have problems or questions, contact your health care provider. What can I expect after the procedure? After the procedure, it is common to have:  A small amount of discomfort in the area where the small, thin tube (catheter) was placed for the procedure.  A small amount of bruising around the fistula.  Sleepiness and tiredness (fatigue). Follow these instructions at  home: Activity   Rest at home and do not lift anything that is heavier than 5 lb (2.3 kg) on the day after your procedure.  Return to your normal activities as told by your health care provider. Ask your health care provider what activities are safe for you.  Do not drive or use heavy machinery while taking prescription pain medicine.  Do not drive for 24 hours if you were given a medicine to help you relax (sedative) during your procedure. Medicines   Take over-the-counter and prescription medicines only as told by your health care provider. Puncture site care  Follow instructions from your health care provider about how to take care of the site where catheters were inserted. Make sure you: ? Wash your hands with soap and water before you change your bandage (dressing). If soap and water are not available, use hand sanitizer. ? Change your dressing as told by your health care provider. ? Leave stitches (sutures), skin glue, or adhesive strips in place. These skin closures may need to stay in place for 2 weeks or longer. If adhesive strip edges start to loosen and curl up, you may trim the  loose edges. Do not remove adhesive strips completely unless your health care provider tells you to do that.  Check your puncture area every day for signs of infection. Check for: ? Redness, swelling, or pain. ? Fluid or blood. ? Warmth. ? Pus or a bad smell. General instructions  Do not take baths, swim, or use a hot tub until your health care provider approves. Ask your health care provider if you may take showers. You may only be allowed to take sponge baths.  Monitor your dialysis fistula closely. Check to make sure that you can feel a vibration or buzz (a thrill) when you put your fingers over the fistula.  Prevent damage to your graft or fistula: ? Do not wear tight-fitting clothing or jewelry on the arm or leg that has your graft or fistula. ? Tell all your health care providers that you  have a dialysis fistula or graft. ? Do not allow blood draws, IVs, or blood pressure readings to be done in the arm that has your fistula or graft. ? Do not allow flu shots or vaccinations in the arm with your fistula or graft.  Keep all follow-up visits as told by your health care provider. This is important. Contact a health care provider if:  You have redness, swelling, or pain at the site where the catheter was put in.  You have fluid or blood coming from the catheter site.  The catheter site feels warm to the touch.  You have pus or a bad smell coming from the catheter site.  You have a fever or chills. Get help right away if:  You feel weak.  You have trouble balancing.  You have trouble moving your arms or legs.  You have problems with your speech or vision.  You can no longer feel a vibration or buzz when you put your fingers over your dialysis fistula.  The limb that was used for the procedure: ? Swells. ? Is painful. ? Is cold. ? Is discolored, such as blue or pale white.  You have chest pain or shortness of breath. Summary  After a dialysis fistulogram, it is common to have a small amount of discomfort or bruising in the area where the small, thin tube (catheter) was placed.  Rest at home on the day after your procedure. Return to your normal activities as told by your health care provider.  Take over-the-counter and prescription medicines only as told by your health care provider.  Follow instructions from your health care provider about how to take care of the site where the catheter was inserted.  Keep all follow-up visits as told by your health care provider. This information is not intended to replace advice given to you by your health care provider. Make sure you discuss any questions you have with your health care provider. Document Released: 09/06/2013 Document Revised: 05/23/2017 Document Reviewed: 05/23/2017 Elsevier Patient Education  2020  [employer].

## 2019-01-12 NOTE — OR Nursing (Signed)
Vir staff to change perm cath dressing prior to discharge.

## 2019-01-12 NOTE — Op Note (Signed)
OPERATIVE NOTE   PROCEDURE: 1. Contrast injection left arm hero graft 2. Placement of a right thigh tunneled catheter  PRE-OPERATIVE DIAGNOSIS: Complication of dialysis access                                                       End Stage Renal Disease  POST-OPERATIVE DIAGNOSIS: same as above   SURGEON: Katha Cabal, M.D.  ANESTHESIA: Conscious sedation was administered under my direct supervision by the interventional radiology RN.  IV Versed plus fentanyl were utilized. Continuous ECG, pulse oximetry and blood pressure was monitored throughout the entire procedure.  Conscious sedation was for a total of 60 minutes.  ESTIMATED BLOOD LOSS: minimal  FINDING(S): 1. There is an ulceration of the midportion of the hero graft and the stents are now exposed.  This represents a contaminated prosthesis and is not an acceptable situation for reconstruction and requires excision of the existing hero graft  SPECIMEN(S):  None  CONTRAST: 5 cc  FLUOROSCOPY TIME: 1.9 minutes  INDICATIONS: Kelli Nguyen is a 73 y.o. female who  presents with malfunctioning left hero AV access.  The patient is scheduled for angiography with possible intervention of the AV access to prevent loss of the permanent access.  The patient is aware the risks include but are not limited to: bleeding, infection, thrombosis of the cannulated access, and possible anaphylactic reaction to the contrast.  The patient acknowledges if the access can not be salvaged a tunneled catheter will be needed and will be placed during this procedure.  The patient is aware of the risks of the procedure and elects to proceed with the angiogram and intervention.  DESCRIPTION: After full informed written consent was obtained, the patient was brought back to the Special Procedure suite and placed supine position.  Appropriate cardiopulmonary monitors were placed.  The left arm was prepped and draped in the standard fashion.  Appropriate  timeout is called.  Ultrasound is placed in a sterile sleeve.  Ultrasound is used to evaluate the AV graft.  Site is selected for access.  Graft is noted to have heterogeneous material consistent with thrombus.  Images recorded for the permanent record and a micro needle is inserted under direct visualization.  Microwire was then inserted followed by micro-sheath.  Hand-injection of contrast was then performed which demonstrates extravasation through the area that is used for cannulation.  This demonstrates that the skin is eroded and that previously placed stents as well as the PTFE graft material are have been contaminated and this is the likely cause of her thrombosis of the hero graft.  Consequently, this is not a situation for thrombectomy and I will move forward with a tunnel catheter.  A 4-0 Monocryl purse-string suture was sewn around the sheath.  The sheath was removed and light pressure was applied.  A sterile bandage was applied to the puncture site.  Attention was then turned to the right thigh which is prepped and draped in a sterile fashion.  Ultrasound was placed in a sterile sleeve. Ultrasound was utilized to identify the right femoral vein which is noted to be echolucent and compressible indicating patency. Image is recorded for the permanent record. Under direct ultrasound visualization a micro-needle is inserted into the right femoral vein followed by the micro-wire. Micro-sheath was then advanced and a J wire  is inserted without difficulty under fluoroscopic guidance. Small counterincision was made at the wire insertion site. Dilators are passed over the wire and the tunneled dialysis catheter is fed into the central venous system without difficulty.  Under fluoroscopy the catheter tip positioned at the atrial caval junction. The catheter is then approximated to the right thigh and an exit site selected. 1% lidocaine is infiltrated in soft tissues at this level small incision is made and  the tunneling device is then passed from the exit site to the groin counterincision. Catheter is then connected to the tunneling device and the catheter was pulled subcutaneously. It is then transected and the hub assembly connected without difficulty. Both lumens aspirate and flush easily. After verification of smooth contour with proper tip position under fluoroscopy the catheter is packed with 5000 units of heparin per lumen.  Catheter secured to the skin of the right thigh with 0 silk. A sterile dressing is applied with a Biopatch.    COMPLICATIONS: None  CONDITION: Unchanged  Katha Cabal, M.D [facility] Office: [phone]  01/12/2019 2:35 PM

## 2019-01-13 ENCOUNTER — Encounter: Payer: Self-pay | Admitting: Vascular Surgery

## 2019-01-14 ENCOUNTER — Telehealth (INDEPENDENT_AMBULATORY_CARE_PROVIDER_SITE_OTHER): Payer: Self-pay

## 2019-01-14 NOTE — Telephone Encounter (Signed)
Patient was scheduled for surgery by the PA [facility].  Stegmayer, Clarene Duke, CMA        I scheduled this patient for a left hero graft excision with permcath insertion for Tuesday 01/19/19 with [facility]. She will prob have to go for pre-op etc if you could please arrange that.  Oran Rein contacted [facility] and spoke with [facility] and will be faxing over to [facility] the pre-surgical instructions.

## 2019-01-15 ENCOUNTER — Other Ambulatory Visit: Payer: Self-pay

## 2019-01-15 ENCOUNTER — Other Ambulatory Visit
Admission: RE | Admit: 2019-01-15 | Discharge: 2019-01-15 | Disposition: A | Discharge status: Home / Self Care | Payer: Medicare Other | Source: Ambulatory Visit | Attending: Vascular Surgery | Admitting: Vascular Surgery

## 2019-01-15 DIAGNOSIS — Z20828 Contact with and (suspected) exposure to other viral communicable diseases: Secondary | ICD-10-CM | POA: Insufficient documentation

## 2019-01-15 DIAGNOSIS — Z01812 Encounter for preprocedural laboratory examination: Secondary | ICD-10-CM | POA: Diagnosis present

## 2019-01-15 LAB — SARS CORONAVIRUS 2 (TAT 6-24 HRS): SARS Coronavirus 2: NEGATIVE

## 2019-01-18 ENCOUNTER — Ambulatory Visit (INDEPENDENT_AMBULATORY_CARE_PROVIDER_SITE_OTHER): Payer: Medicare Other

## 2019-01-18 ENCOUNTER — Encounter (INDEPENDENT_AMBULATORY_CARE_PROVIDER_SITE_OTHER): Payer: Self-pay | Admitting: Nurse Practitioner

## 2019-01-18 ENCOUNTER — Other Ambulatory Visit: Payer: Self-pay

## 2019-01-18 ENCOUNTER — Ambulatory Visit (INDEPENDENT_AMBULATORY_CARE_PROVIDER_SITE_OTHER): Payer: Medicare Other | Admitting: Nurse Practitioner

## 2019-01-18 VITALS — BP 123/71 | HR 41 | Resp 12 | Ht 59.0 in | Wt 177.0 lb

## 2019-01-18 DIAGNOSIS — T829XXS Unspecified complication of cardiac and vascular prosthetic device, implant and graft, sequela: Secondary | ICD-10-CM

## 2019-01-18 DIAGNOSIS — K219 Gastro-esophageal reflux disease without esophagitis: Secondary | ICD-10-CM | POA: Diagnosis not present

## 2019-01-18 DIAGNOSIS — M1990 Unspecified osteoarthritis, unspecified site: Secondary | ICD-10-CM

## 2019-01-18 DIAGNOSIS — N186 End stage renal disease: Secondary | ICD-10-CM | POA: Diagnosis not present

## 2019-01-18 DIAGNOSIS — G894 Chronic pain syndrome: Secondary | ICD-10-CM | POA: Insufficient documentation

## 2019-01-18 DIAGNOSIS — M25552 Pain in left hip: Secondary | ICD-10-CM | POA: Insufficient documentation

## 2019-01-18 DIAGNOSIS — K5900 Constipation, unspecified: Secondary | ICD-10-CM | POA: Insufficient documentation

## 2019-01-18 DIAGNOSIS — L89159 Pressure ulcer of sacral region, unspecified stage: Secondary | ICD-10-CM | POA: Insufficient documentation

## 2019-01-18 DIAGNOSIS — H409 Unspecified glaucoma: Secondary | ICD-10-CM | POA: Insufficient documentation

## 2019-01-18 DIAGNOSIS — M6281 Muscle weakness (generalized): Secondary | ICD-10-CM | POA: Insufficient documentation

## 2019-01-18 NOTE — Progress Notes (Signed)
SUBJECTIVE:  Patient ID: Kelli Nguyen, female    DOB: [birth year], 73 y.o.   MRN: [id number] Chief Complaint  Patient presents with  . Follow-up    HPI  Kelli Nguyen is a 73 y.o. female presents today for her 69-month follow-up however she was recently seen in the emergency room due to not being able to access her hero graft.  It was found and believe that her hero graft was infected due to recurrent thrombosis.  The patient was previously scheduled to have her graft excised.  Today, the patient is maintained via PermCath in her groin and she denies any issues.  She denies any fever, chills, nausea, vomiting or diarrhea.  Hemodialysis duplex reveals occlusion of her hero graft.  Past Medical History:  Diagnosis Date  . Anemia   . Chronic cough   . Chronic kidney disease   . Coronary artery disease   . Diabetes mellitus without complication ([place])   . Dialysis patient ([place])    T-TH-SAT  . GERD (gastroesophageal reflux disease)   . Hypertension   . Neuropathy    hands  . Wears dentures    partial upper and lower    Past Surgical History:  Procedure Laterality Date  . A/V SHUNT INTERVENTION N/A 10/16/2016   Procedure: A/V Shunt Intervention;  Surgeon: Katha Cabal, MD;  Location: [place] [facility];  Service: Cardiovascular;  Laterality: N/A;  . A/V SHUNTOGRAM Left 10/16/2016   Procedure: A/V Shuntogram;  Surgeon: Katha Cabal, MD;  Location: [place] [facility];  Service: Cardiovascular;  Laterality: Left;  . ABDOMINAL HYSTERECTOMY    . CARPAL TUNNEL RELEASE Left 09/27/2015   Procedure: CARPAL TUNNEL RELEASE;  Surgeon: Leanor Kail, MD;  Location: [facility];  Service: Orthopedics;  Laterality: Left;  . CATARACT EXTRACTION W/PHACO Right 09/23/2016   Procedure: CATARACT EXTRACTION PHACO AND INTRAOCULAR LENS PLACEMENT (IOC) complicated diabetic right;  Surgeon: Ronnell Freshwater, MD;  Location: [place];  Service: Ophthalmology;  Laterality:  Right;  Diabetic - diet controlled Potassium draw before surgery  . CATARACT EXTRACTION W/PHACO Left 01/20/2017   Procedure: CATARACT EXTRACTION PHACO AND INTRAOCULAR LENS PLACEMENT (IOC) LEFT DIABETIC;  Surgeon: Eulogio Bear, MD;  Location: [place];  Service: Ophthalmology;  Laterality: Left;  Diabetic - diet controlled Potassium draw prior to procedure  8:00 ARRIVAL  . COLONOSCOPY    . COLONOSCOPY WITH PROPOFOL N/A 11/28/2014   Procedure: COLONOSCOPY WITH PROPOFOL;  Surgeon: Manya Silvas, MD;  Location: [facility];  Service: Endoscopy;  Laterality: N/A;  . CORONARY ARTERY BYPASS GRAFT    . PERIPHERAL VASCULAR CATHETERIZATION Left 01/18/2015   Procedure: A/V Shuntogram/Fistulagram;  Surgeon: Katha Cabal, MD;  Location: [place] [facility];  Service: Cardiovascular;  Laterality: Left;  . PERIPHERAL VASCULAR CATHETERIZATION Left 01/18/2015   Procedure: A/V Shunt Intervention;  Surgeon: Katha Cabal, MD;  Location: [place] [facility];  Service: Cardiovascular;  Laterality: Left;  . PERIPHERAL VASCULAR CATHETERIZATION N/A 08/10/2015   Procedure: Thrombectomy;  Surgeon: Algernon Huxley, MD;  Location: [place] [facility];  Service: Cardiovascular;  Laterality: N/A;  . PERIPHERAL VASCULAR CATHETERIZATION N/A 08/10/2015   Procedure: A/V Shuntogram/Fistulagram;  Surgeon: Algernon Huxley, MD;  Location: [place] [facility];  Service: Cardiovascular;  Laterality: N/A;  . PERIPHERAL VASCULAR CATHETERIZATION N/A 08/10/2015   Procedure: A/V Shunt Intervention;  Surgeon: Algernon Huxley, MD;  Location: [place] [facility];  Service: Cardiovascular;  Laterality: N/A;  .  PERIPHERAL VASCULAR THROMBECTOMY Left 01/12/2019   Procedure: PERIPHERAL VASCULAR THROMBECTOMY;  Surgeon: Katha Cabal, MD;  Location: [place] [facility];  Service: Cardiovascular;  Laterality: Left;  . RENAL SHUNTS      Social History   Socioeconomic History  . Marital status: Widowed    Spouse name: Not on  file  . Number of children: Not on file  . Years of education: Not on file  . Highest education level: Not on file  Occupational History  . Not on file  Social Needs  . Financial resource strain: Not on file  . Food insecurity    Worry: Not on file    Inability: Not on file  . Transportation needs    Medical: Not on file    Non-medical: Not on file  Tobacco Use  . Smoking status: Never Smoker  . Smokeless tobacco: Never Used  Substance and Sexual Activity  . Alcohol use: No  . Drug use: No  . Sexual activity: Not on file  Lifestyle  . Physical activity    Days per week: Not on file    Minutes per session: Not on file  . Stress: Not on file  Relationships  . Social Herbalist on phone: Not on file    Gets together: Not on file    Attends religious service: Not on file    Active member of club or organization: Not on file    Attends meetings of clubs or organizations: Not on file    Relationship status: Not on file  . Intimate partner violence    Fear of current or ex partner: Not on file    Emotionally abused: Not on file    Physically abused: Not on file    Forced sexual activity: Not on file  Other Topics Concern  . Not on file  Social History Narrative  . Not on file    History reviewed. No pertinent family history.  Allergies  Allergen Reactions  . Codeine Sulfate Nausea Only  . Contrast Media [Iodinated Diagnostic Agents] Hives  . Gadobutrol Hives  . Gadolinium Derivatives Hives  . Penicillins Hives and Itching    Has patient had a PCN reaction causing immediate rash, facial/tongue/throat swelling, SOB or lightheadedness with hypotension: No Has patient had a PCN reaction causing severe rash involving mucus membranes or skin necrosis: No Has patient had a PCN reaction that required hospitalization: No Has patient had a PCN reaction occurring within the last 10 years: No If all of the above answers are "NO", then may proceed with Cephalosporin  use.   . Statins Other (See Comments)    muscle pain     Review of Systems   Review of Systems: Negative Unless Checked Constitutional: [] Weight loss  [] Fever  [] Chills Cardiac: [] Chest pain   []  Atrial Fibrillation  [] Palpitations   [] Shortness of breath when laying flat   [] Shortness of breath with exertion. [] Shortness of breath at rest Vascular:  [] Pain in legs with walking   [] Pain in legs with standing [] Pain in legs when laying flat   [] Claudication    [] Pain in feet when laying flat    [] History of DVT   [] Phlebitis   [] Swelling in legs   [] Varicose veins   [] Non-healing ulcers Pulmonary:   [] Uses home oxygen   [] Productive cough   [] Hemoptysis   [] Wheeze  [] COPD   [] Asthma Neurologic:  [] Dizziness   [] Seizures  [] Blackouts [] History of stroke   [] History of TIA  []   Aphasia   [] Temporary Blindness   [] Weakness or numbness in arm   [] Weakness or numbness in leg Musculoskeletal:   [] Joint swelling   [] Joint pain   [] Low back pain  []  History of Knee Replacement [] Arthritis [] back Surgeries  []  Spinal Stenosis    Hematologic:  [] Easy bruising  [] Easy bleeding   [] Hypercoagulable state   [x] Anemic Gastrointestinal:  [] Diarrhea   [] Vomiting  [x] Gastroesophageal reflux/heartburn   [] Difficulty swallowing. [] Abdominal pain Genitourinary:  [x] Chronic kidney disease   [] Difficult urination  [] Anuric   [] Blood in urine [] Frequent urination  [] Burning with urination   [] Hematuria Skin:  [] Rashes   [] Ulcers [] Wounds Psychological:  [] History of anxiety   []  History of major depression  []  Memory Difficulties      OBJECTIVE:   Physical Exam  BP 123/71 (BP Location: Right Wrist, Patient Position: Sitting, Cuff Size: Normal)   Pulse (!) 41   Resp 12   Ht 4\' 11"  (1.499 m)   Wt 177 lb (80.3 kg)   BMI 35.75 kg/m   Gen: WD/WN, NAD Head: [place]/AT, No temporalis wasting.  Ear/Nose/Throat: Hearing grossly intact, nares w/o erythema or drainage Eyes: PER, EOMI, sclera nonicteric.  Neck: Supple,  no masses.  No JVD.  Pulmonary:  Good air movement, no use of accessory muscles.  Cardiac: RRR Vascular:  No thrill or bruit Vessel Right Left  Radial Palpable Palpable   Gastrointestinal: soft, non-distended. No guarding/no peritoneal signs.  Musculoskeletal: M/S 5/5 throughout.  No deformity or atrophy.  Neurologic: Pain and light touch intact in extremities.  Symmetrical.  Speech is fluent. Motor exam as listed above. Psychiatric: Judgment intact, Mood & affect appropriate for pt's clinical situation. Dermatologic: No Venous rashes. No Ulcers Noted.  No changes consistent with cellulitis. Lymph : No Cervical lymphadenopathy, no lichenification or skin changes of chronic lymphedema.       ASSESSMENT AND PLAN:  1. Complication of vascular access for dialysis, sequela The patient will undergo excision of her hero graft.  All risks, benefits, and alternatives were previously discussed with patient.  Patient will follow-up in office after the procedure.  2. Gastroesophageal reflux disease, esophagitis presence not specified Continue PPI as already ordered, this medication has been reviewed and there are no changes at this time.  Avoidence of caffeine and alcohol  Moderate elevation of the head of the bed   3. Arthritis Continue NSAID medications as already ordered, these medications have been reviewed and there are no changes at this time.  Continued activity and therapy was stressed.    Current Outpatient Medications on File Prior to Visit  Medication Sig Dispense Refill  . acetaminophen (TYLENOL) 500 MG tablet Tylenol Extra Strength 500 mg tablet  Take 2 tablets every 8 hours by oral route as needed.    [name] aspirin 81 MG tablet Take 81 mg by mouth daily.    . cinacalcet (SENSIPAR) 30 MG tablet Take 30 mg by mouth 3 (three) times a week. On dialysis days - Tues, Thurs, Sat    . DiphenhydrAMINE HCl, Sleep, 50 MG tablet Take 1 tablet by mouth 30 minutes before CT scan    .  DUREZOL 0.05 % EMUL Place 1 drop into both eyes 2 (two) times daily.  0  . isosorbide mononitrate (IMDUR) 30 MG 24 hr tablet Take 30 mg by mouth daily.    [name] latanoprost (XALATAN) 0.005 % ophthalmic solution Place 1 drop into both eyes 2 (two) times daily.     [name] POLYETHYLENE GLYCOL 3350 PO  as needed.     . pravastatin (PRAVACHOL) 20 MG tablet Take 20 mg by mouth daily.    . ranitidine (ZANTAC) 150 MG tablet Take 150 mg by mouth 2 (two) times daily.     No current facility-administered medications on file prior to visit.     There are no Patient Instructions on file for this visit. No follow-ups on file.   Kris Hartmann, NP  This note was completed with Sales executive.  Any errors are purely unintentional.

## 2019-01-19 ENCOUNTER — Ambulatory Visit: Payer: Medicare Other

## 2019-01-19 ENCOUNTER — Inpatient Hospital Stay
Admission: AD | Admit: 2019-01-19 | Discharge: 2019-01-21 | DRG: 314 | Disposition: A | Discharge status: Home / Self Care | Payer: Medicare Other | Attending: Vascular Surgery | Admitting: Vascular Surgery

## 2019-01-19 ENCOUNTER — Encounter
Admission: AD | Disposition: A | Discharge status: Home / Self Care | Payer: Self-pay | Source: Home / Self Care | Attending: Vascular Surgery

## 2019-01-19 ENCOUNTER — Other Ambulatory Visit: Payer: Self-pay

## 2019-01-19 ENCOUNTER — Ambulatory Visit: Payer: Medicare Other | Admitting: Anesthesiology

## 2019-01-19 ENCOUNTER — Encounter: Payer: Self-pay | Admitting: *Deleted

## 2019-01-19 DIAGNOSIS — Z961 Presence of intraocular lens: Secondary | ICD-10-CM | POA: Diagnosis present

## 2019-01-19 DIAGNOSIS — T8241XA Breakdown (mechanical) of vascular dialysis catheter, initial encounter: Secondary | ICD-10-CM

## 2019-01-19 DIAGNOSIS — Z79899 Other long term (current) drug therapy: Secondary | ICD-10-CM

## 2019-01-19 DIAGNOSIS — E875 Hyperkalemia: Secondary | ICD-10-CM | POA: Diagnosis present

## 2019-01-19 DIAGNOSIS — R6521 Severe sepsis with septic shock: Secondary | ICD-10-CM

## 2019-01-19 DIAGNOSIS — E1140 Type 2 diabetes mellitus with diabetic neuropathy, unspecified: Secondary | ICD-10-CM | POA: Diagnosis present

## 2019-01-19 DIAGNOSIS — Z992 Dependence on renal dialysis: Secondary | ICD-10-CM

## 2019-01-19 DIAGNOSIS — Z972 Presence of dental prosthetic device (complete) (partial): Secondary | ICD-10-CM

## 2019-01-19 DIAGNOSIS — I9581 Postprocedural hypotension: Secondary | ICD-10-CM | POA: Diagnosis not present

## 2019-01-19 DIAGNOSIS — K219 Gastro-esophageal reflux disease without esophagitis: Secondary | ICD-10-CM | POA: Diagnosis present

## 2019-01-19 DIAGNOSIS — Z7982 Long term (current) use of aspirin: Secondary | ICD-10-CM

## 2019-01-19 DIAGNOSIS — E1122 Type 2 diabetes mellitus with diabetic chronic kidney disease: Secondary | ICD-10-CM | POA: Diagnosis present

## 2019-01-19 DIAGNOSIS — I871 Compression of vein: Secondary | ICD-10-CM | POA: Diagnosis present

## 2019-01-19 DIAGNOSIS — Z9842 Cataract extraction status, left eye: Secondary | ICD-10-CM

## 2019-01-19 DIAGNOSIS — Z951 Presence of aortocoronary bypass graft: Secondary | ICD-10-CM

## 2019-01-19 DIAGNOSIS — Z885 Allergy status to narcotic agent status: Secondary | ICD-10-CM

## 2019-01-19 DIAGNOSIS — T829XXA Unspecified complication of cardiac and vascular prosthetic device, implant and graft, initial encounter: Secondary | ICD-10-CM | POA: Diagnosis present

## 2019-01-19 DIAGNOSIS — T827XXA Infection and inflammatory reaction due to other cardiac and vascular devices, implants and grafts, initial encounter: Principal | ICD-10-CM | POA: Diagnosis present

## 2019-01-19 DIAGNOSIS — A419 Sepsis, unspecified organism: Secondary | ICD-10-CM

## 2019-01-19 DIAGNOSIS — I120 Hypertensive chronic kidney disease with stage 5 chronic kidney disease or end stage renal disease: Secondary | ICD-10-CM | POA: Diagnosis present

## 2019-01-19 DIAGNOSIS — Z880 Allergy status to penicillin: Secondary | ICD-10-CM

## 2019-01-19 DIAGNOSIS — N186 End stage renal disease: Secondary | ICD-10-CM | POA: Diagnosis present

## 2019-01-19 DIAGNOSIS — D631 Anemia in chronic kidney disease: Secondary | ICD-10-CM | POA: Diagnosis present

## 2019-01-19 DIAGNOSIS — Z90710 Acquired absence of both cervix and uterus: Secondary | ICD-10-CM

## 2019-01-19 DIAGNOSIS — Y832 Surgical operation with anastomosis, bypass or graft as the cause of abnormal reaction of the patient, or of later complication, without mention of misadventure at the time of the procedure: Secondary | ICD-10-CM | POA: Diagnosis present

## 2019-01-19 DIAGNOSIS — I2510 Atherosclerotic heart disease of native coronary artery without angina pectoris: Secondary | ICD-10-CM | POA: Diagnosis present

## 2019-01-19 DIAGNOSIS — Z91041 Radiographic dye allergy status: Secondary | ICD-10-CM

## 2019-01-19 DIAGNOSIS — T82898A Other specified complication of vascular prosthetic devices, implants and grafts, initial encounter: Secondary | ICD-10-CM | POA: Diagnosis not present

## 2019-01-19 DIAGNOSIS — Z9841 Cataract extraction status, right eye: Secondary | ICD-10-CM

## 2019-01-19 DIAGNOSIS — Z888 Allergy status to other drugs, medicaments and biological substances status: Secondary | ICD-10-CM

## 2019-01-19 HISTORY — PX: LIGATIONS OF HERO GRAFT: SHX6714

## 2019-01-19 HISTORY — PX: INSERTION OF DIALYSIS CATHETER: SHX1324

## 2019-01-19 HISTORY — PX: REMOVAL OF A DIALYSIS CATHETER: SHX6053

## 2019-01-19 LAB — CBC WITH DIFFERENTIAL/PLATELET
Abs Immature Granulocytes: 0.16 10*3/uL — ABNORMAL HIGH (ref 0.00–0.07)
Abs Immature Granulocytes: 0.09 10*3/uL — ABNORMAL HIGH (ref 0.00–0.07)
Basophils Absolute: 0 10*3/uL (ref 0.0–0.1)
Basophils Absolute: 0 10*3/uL (ref 0.0–0.1)
Basophils Relative: 0 %
Basophils Relative: 0 %
Eosinophils Absolute: 0.3 10*3/uL (ref 0.0–0.5)
Eosinophils Absolute: 0.3 10*3/uL (ref 0.0–0.5)
Eosinophils Relative: 3 %
Eosinophils Relative: 3 %
HCT: 34.4 % — ABNORMAL LOW (ref 36.0–46.0)
HCT: 26.9 % — ABNORMAL LOW (ref 36.0–46.0)
Hemoglobin: 10.8 g/dL — ABNORMAL LOW (ref 12.0–15.0)
Hemoglobin: 8.4 g/dL — ABNORMAL LOW (ref 12.0–15.0)
Immature Granulocytes: 1 %
Immature Granulocytes: 1 %
Lymphocytes Relative: 18 %
Lymphocytes Relative: 18 %
Lymphs Abs: 2 10*3/uL (ref 0.7–4.0)
Lymphs Abs: 1.8 10*3/uL (ref 0.7–4.0)
MCH: 27.5 pg (ref 26.0–34.0)
MCH: 27.3 pg (ref 26.0–34.0)
MCHC: 31.4 g/dL (ref 30.0–36.0)
MCHC: 31.2 g/dL (ref 30.0–36.0)
MCV: 87.5 fL (ref 80.0–100.0)
MCV: 87.3 fL (ref 80.0–100.0)
Monocytes Absolute: 1.2 10*3/uL — ABNORMAL HIGH (ref 0.1–1.0)
Monocytes Absolute: 1.2 10*3/uL — ABNORMAL HIGH (ref 0.1–1.0)
Monocytes Relative: 11 %
Monocytes Relative: 12 %
Neutro Abs: 7.3 10*3/uL (ref 1.7–7.7)
Neutro Abs: 6.3 10*3/uL (ref 1.7–7.7)
Neutrophils Relative %: 67 %
Neutrophils Relative %: 66 %
Platelets: 220 10*3/uL (ref 150–400)
Platelets: 193 10*3/uL (ref 150–400)
RBC: 3.93 MIL/uL (ref 3.87–5.11)
RBC: 3.08 MIL/uL — ABNORMAL LOW (ref 3.87–5.11)
RDW: 14.2 % (ref 11.5–15.5)
RDW: 14.2 % (ref 11.5–15.5)
WBC: 11.1 10*3/uL — ABNORMAL HIGH (ref 4.0–10.5)
WBC: 9.6 10*3/uL (ref 4.0–10.5)
nRBC: 0 % (ref 0.0–0.2)
nRBC: 0 % (ref 0.0–0.2)

## 2019-01-19 LAB — BASIC METABOLIC PANEL
Anion gap: 15 (ref 5–15)
BUN: 66 mg/dL — ABNORMAL HIGH (ref 8–23)
CO2: 29 mmol/L (ref 22–32)
Calcium: 8.9 mg/dL (ref 8.9–10.3)
Chloride: 96 mmol/L — ABNORMAL LOW (ref 98–111)
Creatinine, Ser: 10.33 mg/dL — ABNORMAL HIGH (ref 0.44–1.00)
GFR calc Af Amer: 4 mL/min — ABNORMAL LOW (ref >60)
GFR calc non Af Amer: 3 mL/min — ABNORMAL LOW (ref >60)
Glucose, Bld: 132 mg/dL — ABNORMAL HIGH (ref 70–99)
Potassium: 4.6 mmol/L (ref 3.5–5.1)
Sodium: 140 mmol/L (ref 135–145)

## 2019-01-19 LAB — GLUCOSE, CAPILLARY
Glucose-Capillary: 129 mg/dL — ABNORMAL HIGH (ref 70–99)
Glucose-Capillary: 129 mg/dL — ABNORMAL HIGH (ref 70–99)
Glucose-Capillary: 95 mg/dL (ref 70–99)

## 2019-01-19 LAB — PROTIME-INR
INR: 1 (ref 0.8–1.2)
Prothrombin Time: 13.3 seconds (ref 11.4–15.2)

## 2019-01-19 LAB — PROCALCITONIN: Procalcitonin: 0.26 ng/mL

## 2019-01-19 LAB — APTT: aPTT: 34 seconds (ref 24–36)

## 2019-01-19 LAB — MRSA PCR SCREENING: MRSA by PCR: NEGATIVE

## 2019-01-19 SURGERY — LIGATIONS OF HERO GRAFT
Anesthesia: General | Site: Groin | Laterality: Right

## 2019-01-19 MED ORDER — LIDOCAINE HCL (PF) 2 % IJ SOLN
INTRAMUSCULAR | Status: AC
  Filled 2019-01-19: qty 10

## 2019-01-19 MED ORDER — DOCUSATE SODIUM 100 MG PO CAPS
100.0000 mg | ORAL_CAPSULE | Freq: Two times a day (BID) | ORAL | Status: DC
  Administered 2019-01-19 – 2019-01-21 (×4): 100 mg via ORAL
  Filled 2019-01-19 (×4): qty 1

## 2019-01-19 MED ORDER — GLYCOPYRROLATE 0.2 MG/ML IJ SOLN
INTRAMUSCULAR | Status: DC | PRN
  Administered 2019-01-19: .2 mg via INTRAVENOUS

## 2019-01-19 MED ORDER — LATANOPROST 0.005 % OP SOLN
1.0000 [drp] | Freq: Two times a day (BID) | OPHTHALMIC | Status: DC
  Administered 2019-01-19 – 2019-01-20 (×3): 1 [drp] via OPHTHALMIC
  Filled 2019-01-19 (×2): qty 2.5

## 2019-01-19 MED ORDER — CHLORHEXIDINE GLUCONATE CLOTH 2 % EX PADS: 6.0000 | MEDICATED_PAD | Freq: Once | CUTANEOUS | Status: DC

## 2019-01-19 MED ORDER — PHENYLEPHRINE HCL (PRESSORS) 10 MG/ML IV SOLN
INTRAVENOUS | Status: AC
  Filled 2019-01-19: qty 1

## 2019-01-19 MED ORDER — SODIUM CHLORIDE 0.9 % IV SOLN
0.0000 ug/min | INTRAVENOUS | Status: DC
  Administered 2019-01-19: 20:00:00 20 ug/min via INTRAVENOUS
  Administered 2019-01-20: 03:00:00 25 ug/min via INTRAVENOUS
  Filled 2019-01-19 (×2): qty 10

## 2019-01-19 MED ORDER — FENTANYL CITRATE (PF) 100 MCG/2ML IJ SOLN
INTRAMUSCULAR | Status: DC | PRN
  Administered 2019-01-19 (×4): 25 ug via INTRAVENOUS

## 2019-01-19 MED ORDER — EPHEDRINE SULFATE 50 MG/ML IJ SOLN
INTRAMUSCULAR | Status: DC | PRN
  Administered 2019-01-19 (×2): 5 mg via INTRAVENOUS
  Administered 2019-01-19: 10 mg via INTRAVENOUS

## 2019-01-19 MED ORDER — PROPOFOL 10 MG/ML IV BOLUS
INTRAVENOUS | Status: DC | PRN
  Administered 2019-01-19: 120 mg via INTRAVENOUS
  Administered 2019-01-19: 40 mg via INTRAVENOUS
  Administered 2019-01-19 (×2): 20 mg via INTRAVENOUS

## 2019-01-19 MED ORDER — FENTANYL CITRATE (PF) 100 MCG/2ML IJ SOLN
INTRAMUSCULAR | Status: AC
  Filled 2019-01-19: qty 2

## 2019-01-19 MED ORDER — DEXMEDETOMIDINE HCL IN NACL 200 MCG/50ML IV SOLN
INTRAVENOUS | Status: DC | PRN
  Administered 2019-01-19 (×4): 4 ug via INTRAVENOUS

## 2019-01-19 MED ORDER — ONDANSETRON HCL 4 MG/2ML IJ SOLN
INTRAMUSCULAR | Status: DC | PRN
  Administered 2019-01-19: 4 mg via INTRAVENOUS

## 2019-01-19 MED ORDER — ISOSORBIDE MONONITRATE ER 30 MG PO TB24
30.0000 mg | ORAL_TABLET | Freq: Every day | ORAL | Status: DC
  Administered 2019-01-21: 30 mg via ORAL
  Filled 2019-01-19 (×2): qty 1

## 2019-01-19 MED ORDER — VASOPRESSIN 20 UNIT/ML IV SOLN
INTRAVENOUS | Status: AC
  Filled 2019-01-19: qty 1

## 2019-01-19 MED ORDER — LIDOCAINE HCL URETHRAL/MUCOSAL 2 % EX GEL
CUTANEOUS | Status: AC
  Filled 2019-01-19: qty 5

## 2019-01-19 MED ORDER — ONDANSETRON HCL 4 MG PO TABS: 4.0000 mg | ORAL_TABLET | Freq: Four times a day (QID) | ORAL | Status: DC | PRN

## 2019-01-19 MED ORDER — HYDROCODONE-ACETAMINOPHEN 5-325 MG PO TABS: 1.0000 | ORAL_TABLET | Freq: Four times a day (QID) | ORAL | Status: DC | PRN

## 2019-01-19 MED ORDER — CLINDAMYCIN PHOSPHATE 300 MG/50ML IV SOLN
INTRAVENOUS | Status: AC
  Filled 2019-01-19: qty 50

## 2019-01-19 MED ORDER — BUPIVACAINE LIPOSOME 1.3 % IJ SUSP
INTRAMUSCULAR | Status: AC
  Filled 2019-01-19: qty 20

## 2019-01-19 MED ORDER — CLINDAMYCIN PHOSPHATE 300 MG/50ML IV SOLN
300.0000 mg | INTRAVENOUS | Status: AC
  Administered 2019-01-19: 300 mg via INTRAVENOUS

## 2019-01-19 MED ORDER — CHLORHEXIDINE GLUCONATE CLOTH 2 % EX PADS
6.0000 | MEDICATED_PAD | Freq: Every day | CUTANEOUS | Status: DC
  Administered 2019-01-19 – 2019-01-21 (×4): 6 via TOPICAL

## 2019-01-19 MED ORDER — HEPARIN SODIUM (PORCINE) 5000 UNIT/ML IJ SOLN
INTRAMUSCULAR | Status: AC
  Filled 2019-01-19: qty 1

## 2019-01-19 MED ORDER — HYDROCODONE-ACETAMINOPHEN 5-325 MG PO TABS: 1.0000 | ORAL_TABLET | ORAL | Status: DC | PRN

## 2019-01-19 MED ORDER — PRAVASTATIN SODIUM 20 MG PO TABS
20.0000 mg | ORAL_TABLET | Freq: Every day | ORAL | Status: DC
  Administered 2019-01-19 – 2019-01-21 (×3): 20 mg via ORAL
  Filled 2019-01-19 (×3): qty 1

## 2019-01-19 MED ORDER — ACETAMINOPHEN 325 MG PO TABS: 650.0000 mg | ORAL_TABLET | Freq: Four times a day (QID) | ORAL | Status: DC | PRN

## 2019-01-19 MED ORDER — BUPIVACAINE-EPINEPHRINE (PF) 0.25% -1:200000 IJ SOLN
INTRAMUSCULAR | Status: AC
  Filled 2019-01-19: qty 30

## 2019-01-19 MED ORDER — ONDANSETRON HCL 4 MG/2ML IJ SOLN: 4.0000 mg | Freq: Four times a day (QID) | INTRAMUSCULAR | Status: DC | PRN

## 2019-01-19 MED ORDER — ONDANSETRON HCL 4 MG/2ML IJ SOLN: 4.0000 mg | Freq: Once | INTRAMUSCULAR | Status: DC | PRN

## 2019-01-19 MED ORDER — BUPIVACAINE LIPOSOME 1.3 % IJ SUSP
INTRAMUSCULAR | Status: DC | PRN
  Administered 2019-01-19: 20 mL

## 2019-01-19 MED ORDER — CINACALCET HCL 30 MG PO TABS
30.0000 mg | ORAL_TABLET | ORAL | Status: DC
  Administered 2019-01-21: 12:00:00 30 mg via ORAL
  Filled 2019-01-19 (×2): qty 1

## 2019-01-19 MED ORDER — PHENYLEPHRINE HCL (PRESSORS) 10 MG/ML IV SOLN
INTRAVENOUS | Status: DC | PRN
  Administered 2019-01-19 (×9): 100 ug via INTRAVENOUS

## 2019-01-19 MED ORDER — SODIUM CHLORIDE 0.9 % IV SOLN
INTRAVENOUS | Status: DC
  Administered 2019-01-19: 15:00:00 via INTRAVENOUS

## 2019-01-19 MED ORDER — OXYCODONE-ACETAMINOPHEN 5-325 MG PO TABS: 1.0000 | ORAL_TABLET | Freq: Four times a day (QID) | ORAL | 0 refills | Status: AC | PRN

## 2019-01-19 MED ORDER — EPHEDRINE SULFATE 50 MG/ML IJ SOLN
INTRAMUSCULAR | Status: AC
  Filled 2019-01-19: qty 1

## 2019-01-19 MED ORDER — BUPIVACAINE-EPINEPHRINE 0.25% -1:200000 IJ SOLN
INTRAMUSCULAR | Status: DC | PRN
  Administered 2019-01-19: 20 mL

## 2019-01-19 MED ORDER — ASPIRIN 81 MG PO CHEW
81.0000 mg | CHEWABLE_TABLET | Freq: Every day | ORAL | Status: DC
  Administered 2019-01-19 – 2019-01-21 (×3): 81 mg via ORAL
  Filled 2019-01-19 (×3): qty 1

## 2019-01-19 MED ORDER — BUPIVACAINE HCL (PF) 0.5 % IJ SOLN
INTRAMUSCULAR | Status: DC | PRN
  Administered 2019-01-19: 30 mL

## 2019-01-19 MED ORDER — FENTANYL CITRATE (PF) 100 MCG/2ML IJ SOLN: 25.0000 ug | INTRAMUSCULAR | Status: DC | PRN

## 2019-01-19 MED ORDER — POLYETHYLENE GLYCOL 3350 17 G PO PACK: 17.0000 g | PACK | Freq: Every day | ORAL | Status: DC | PRN

## 2019-01-19 MED ORDER — SODIUM CHLORIDE 0.9 % IV SOLN
INTRAVENOUS | Status: DC
  Administered 2019-01-19 (×2): via INTRAVENOUS

## 2019-01-19 MED ORDER — LIDOCAINE HCL (CARDIAC) PF 100 MG/5ML IV SOSY
PREFILLED_SYRINGE | INTRAVENOUS | Status: DC | PRN
  Administered 2019-01-19: 100 mg via INTRAVENOUS

## 2019-01-19 MED ORDER — DIFLUPREDNATE 0.05 % OP EMUL: 1.0000 [drp] | Freq: Two times a day (BID) | OPHTHALMIC | Status: DC

## 2019-01-19 MED ORDER — PROPOFOL 10 MG/ML IV BOLUS
INTRAVENOUS | Status: AC
  Filled 2019-01-19: qty 20

## 2019-01-19 MED ORDER — SODIUM CHLORIDE 0.9 % IV SOLN
INTRAVENOUS | Status: DC | PRN
  Administered 2019-01-19: 30 ug/min via INTRAVENOUS

## 2019-01-19 MED ORDER — VASOPRESSIN 20 UNIT/ML IV SOLN
INTRAVENOUS | Status: DC | PRN
  Administered 2019-01-19 (×5): 2 [IU] via INTRAVENOUS
  Administered 2019-01-19: 1 [IU] via INTRAVENOUS
  Administered 2019-01-19: 2 [IU] via INTRAVENOUS
  Administered 2019-01-19 (×4): 1 [IU] via INTRAVENOUS
  Administered 2019-01-19 (×3): 2 [IU] via INTRAVENOUS
  Administered 2019-01-19: 1 [IU] via INTRAVENOUS

## 2019-01-19 MED ORDER — PHENYLEPHRINE HCL-NACL 10-0.9 MG/250ML-% IV SOLN
0.0000 ug/min | INTRAVENOUS | Status: DC
  Administered 2019-01-19: 12:00:00 20 ug/min via INTRAVENOUS
  Filled 2019-01-19: qty 250

## 2019-01-19 MED ORDER — BUPIVACAINE HCL (PF) 0.5 % IJ SOLN
INTRAMUSCULAR | Status: AC
  Filled 2019-01-19: qty 30

## 2019-01-19 MED ORDER — DEXMEDETOMIDINE HCL IN NACL 80 MCG/20ML IV SOLN
INTRAVENOUS | Status: AC
  Filled 2019-01-19: qty 20

## 2019-01-19 SURGICAL SUPPLY — 69 items
APPLIER CLIP 9.375 SM OPEN (CLIP)
BAG DECANTER FOR FLEXI CONT (MISCELLANEOUS) ×5 IMPLANT
BLADE SURG 15 STRL LF DISP TIS (BLADE) ×3 IMPLANT
BLADE SURG 15 STRL SS (BLADE) ×2
BLADE SURG SZ11 CARB STEEL (BLADE) ×5 IMPLANT
BNDG GAUZE 4.5X4.1 6PLY STRL (MISCELLANEOUS) ×2 IMPLANT
BOOT SUTURE AID YELLOW STND (SUTURE) ×5 IMPLANT
BRUSH SCRUB EZ  4% CHG (MISCELLANEOUS) ×2
BRUSH SCRUB EZ 4% CHG (MISCELLANEOUS) ×3 IMPLANT
CANISTER SUCT 1200ML W/VALVE (MISCELLANEOUS) ×5 IMPLANT
CATH PALINDROME RT-P 15FX23CM (CATHETERS) ×4 IMPLANT
CHLORAPREP W/TINT 26 (MISCELLANEOUS) ×5 IMPLANT
CLIP APPLIE 9.375 SM OPEN (CLIP) IMPLANT
COVER WAND RF STERILE (DRAPES) ×5 IMPLANT
DECANTER SPIKE VIAL GLASS SM (MISCELLANEOUS) IMPLANT
DERMABOND ADVANCED (GAUZE/BANDAGES/DRESSINGS) ×4
DERMABOND ADVANCED .7 DNX12 (GAUZE/BANDAGES/DRESSINGS) ×3 IMPLANT
DRAPE C-ARM XRAY 36X54 (DRAPES) ×5 IMPLANT
DRAPE INCISE IOBAN 66X45 STRL (DRAPES) ×7 IMPLANT
DRAPE LAPAROTOMY 100X77 ABD (DRAPES) ×5 IMPLANT
DRAPE LAPAROTOMY 77X122 PED (DRAPES) ×5 IMPLANT
DRESSING SURGICEL FIBRLLR 1X2 (HEMOSTASIS) ×3 IMPLANT
DRSG SURGICEL FIBRILLAR 1X2 (HEMOSTASIS)
DRSG TEGADERM 4X4.75 (GAUZE/BANDAGES/DRESSINGS) ×2 IMPLANT
ELECT CAUTERY BLADE 6.4 (BLADE) ×5 IMPLANT
ELECT REM PT RETURN 9FT ADLT (ELECTROSURGICAL) ×5
ELECTRODE REM PT RTRN 9FT ADLT (ELECTROSURGICAL) ×3 IMPLANT
GLOVE SURG SYN 8.0 (GLOVE) ×5 IMPLANT
GLOVE SURG SYN 8.0 PF PI (GLOVE) ×3 IMPLANT
GOWN STRL REUS W/ TWL LRG LVL3 (GOWN DISPOSABLE) ×3 IMPLANT
GOWN STRL REUS W/ TWL XL LVL3 (GOWN DISPOSABLE) ×3 IMPLANT
GOWN STRL REUS W/TWL LRG LVL3 (GOWN DISPOSABLE) ×4
GOWN STRL REUS W/TWL XL LVL3 (GOWN DISPOSABLE) ×4
GUIDEWIRE SUPER STIFF .035X180 (WIRE) ×2 IMPLANT
IV NS 500ML (IV SOLUTION) ×2
IV NS 500ML BAXH (IV SOLUTION) ×3 IMPLANT
KIT TURNOVER KIT A (KITS) ×5 IMPLANT
LABEL OR SOLS (LABEL) ×5 IMPLANT
LOOP RED MAXI  1X406MM (MISCELLANEOUS) ×2
LOOP VESSEL MAXI 1X406 RED (MISCELLANEOUS) ×3 IMPLANT
LOOP VESSEL MINI 0.8X406 BLUE (MISCELLANEOUS) ×3 IMPLANT
LOOPS BLUE MINI 0.8X406MM (MISCELLANEOUS) ×2
NDL HYPO 25X1 1.5 SAFETY (NEEDLE) IMPLANT
NEEDLE HYPO 25X1 1.5 SAFETY (NEEDLE) IMPLANT
PACK BASIN MINOR ARMC (MISCELLANEOUS) ×5 IMPLANT
PACK EXTREMITY ARMC (MISCELLANEOUS) ×5 IMPLANT
PAD ABD DERMACEA PRESS 5X9 (GAUZE/BANDAGES/DRESSINGS) ×4 IMPLANT
PAD PREP 24X41 OB/GYN DISP (PERSONAL CARE ITEMS) ×5 IMPLANT
SPONGE GAUZE 2X2 8PLY STER LF (GAUZE/BANDAGES/DRESSINGS) ×2
SPONGE GAUZE 2X2 8PLY STRL LF (GAUZE/BANDAGES/DRESSINGS) ×2 IMPLANT
STAPLER SKIN PROX 35W (STAPLE) ×2 IMPLANT
STOCKINETTE 48X4 2 PLY STRL (GAUZE/BANDAGES/DRESSINGS) ×3 IMPLANT
STOCKINETTE STRL 4IN 9604848 (GAUZE/BANDAGES/DRESSINGS) ×5 IMPLANT
SUT ETHIBOND 0 (SUTURE) IMPLANT
SUT MNCRL+ 5-0 UNDYED PC-3 (SUTURE) ×3 IMPLANT
SUT MONOCRYL 5-0 (SUTURE) ×2
SUT PROLENE 6 0 BV (SUTURE) ×14 IMPLANT
SUT SILK 2 0 (SUTURE) ×2
SUT SILK 2-0 18XBRD TIE 12 (SUTURE) ×3 IMPLANT
SUT SILK 3 0 (SUTURE) ×2
SUT SILK 3-0 18XBRD TIE 12 (SUTURE) ×3 IMPLANT
SUT SILK 4 0 (SUTURE) ×2
SUT SILK 4-0 18XBRD TIE 12 (SUTURE) ×3 IMPLANT
SUT VIC AB 2-0 CT1 (SUTURE) IMPLANT
SUT VIC AB 3-0 SH 27 (SUTURE) ×12
SUT VIC AB 3-0 SH 27X BRD (SUTURE) ×6 IMPLANT
SUT VICRYL+ 3-0 36IN CT-1 (SUTURE) IMPLANT
SYR 10ML LL (SYRINGE) IMPLANT
SYR 20ML LL LF (SYRINGE) ×5 IMPLANT

## 2019-01-19 NOTE — Progress Notes (Signed)
Dr. Rosey Bath aware of patient status, continue with previous order and notify  Dr. Delana Meyer.

## 2019-01-19 NOTE — Op Note (Signed)
OPERATIVE NOTE   PROCEDURE: 1. Removal of infected left arm hero AV graft 2. Placement of a left IJ tunneled catheter same venous access 3. Removal of the right femoral tunneled catheter  PRE-OPERATIVE DIAGNOSIS: Infected left arm hero AV graft; end-stage renal disease on hemodialysis; superior vena cava syndrome  POST-OPERATIVE DIAGNOSIS: Same  SURGEON: Katha Cabal, M.D. ASSISTANT(S): Ms. Hezzie Bump  ANESTHESIA: general  ESTIMATED BLOOD LOSS: 150 cc  FINDING(S): 1.  Infected hero graft  SPECIMEN(S):  AV graft in segments  INDICATIONS:   Kelli Nguyen is a 73 y.o. female who presents with an infected AV access.  Given the prosthetic material this will require excision. The risks and benefits of been reviewed all questions are answered patient has agreed to proceed.  DESCRIPTION: After obtaining full informed written consent, the patient was brought back to the operating room and placed supine upon the operating table.  The patient received IV antibiotics prior to induction.  After obtaining adequate anesthesia, the patient's left arm left neck and chest wall was prepped and draped in the standard fashion appropriate time out is called.  A first assistant is required in order to allow for a safe and more efficient operation.  Duties include retraction of tissues to allow for optimal exposure, assisting with suture ligation of vessels as well as maintaining a clear field of view with suction as needed.  Further duties include assisting with patient positioning during the surgery as well as wound closure.  I believe that this procedure requires a first assistant in order for it to be performed at a level in keeping with the high standards of this institution.  Quarter percent Marcaine with epinephrine is infiltrated into the base of the neck surrounding the palpable hero graft intravascular section.  Small transverse incision is made and the dissection is carried down to  expose the intravascular portion of the hero graft.  This is grasped by Claiborne Billings clamps and delivered into the field.  Fluoroscopy is then brought into the field and under fluoroscopic guidance the hero graft is transected and an Amplatz Super Stiff wire into the inferior vena cava.  The intravascular portion is then removed over the wire.  A 23 cm tip to cuff palindrome catheter is opened on the field and the dilator with peel-away sheath is advanced over the Amplatz wire.  Dilator was removed and the palindrome catheter is advanced over the wire through the peel-away sheath.  Peel-away sheath is removed and under fluoroscopic guidance the wire was removed.  Catheter is positioned with its tip in the mid atrium and then approximated to the chest wall.  Exit site is selected and anesthetized with quarter percent Marcaine with epinephrine.  Small transverse incision is created and the tunneling device is passed from the exit site to the neck counterincision.  Catheter is then connected to the tunneling device and the catheters pulled subcutaneously.  Under fluoroscopic guidance the tip is adjusted so that it is lying in the mid atrium.  The external portion of the catheter is then transected and the hub assembly connected without difficulty.  Both lumens aspirated and flushed easily.  Both lumens were then packed with 5000 units of heparin per 2 cc aliquot.  The neck counterincision was closed with 4-0 Monocryl subcuticular a pursestring of 4-0 Monocryl subcuticular was placed around the exit site and the catheter secured to the chest wall with 0 silk suture.  Sterile dressings including a Biopatch were applied.  Attention is now  turned to the remaining portions of the hero graft within the left upper arm.  The previous incisional scar overlying the anastomosis is then re-incised and the dissection is carried down to expose the arterial anastomosis. The native artery is then dissected circumferentially proximally  and distally and red Vesseloops are placed proximally and distally.  Vascular control is then obtained using a 45 degree angled Fogarty clamp as well as a profunda clamp.  The graft is then excised at the suture line.  The back wall of the brachial artery is then transected and once the entire segment of PTFE has been removed the artery is reapproximated using running 6-0 Prolene.  Flushing maneuvers were performed and flow was reestablished to the brachial artery.  A second segment of PTFE is also identified it within this field and this is transected and oversewn at the level of the suture line.  The end of the PTFE portion of the hero graft was then grasped with a Kelly clamp dissected circumferentially and once approximately 2 cm had been exposed it was transected with Mayo scissors and allowed to retract into the wound.  The track was then oversewn with an interrupted figure-of-eight 3-0 Vicryl and the wound overlying the brachial artery repair was closed in multiple layers using interrupted 3-0 Vicryl.  15 blade scalpel was then used to create an elliptical incision overlying the PTFE portion of the hero graft and the dissection carried down through soft tissues.  Given the patient's central venous stenosis there is 6 significant venous hypertension and veins were ligated with 3-0 Vicryl as they were encountered or cauterized with Bovie.  The transected arterial end was then dissected free from the surrounding tissues and circumferential dissection including the skin en bloc was created all the way up to the level of the deltoid.  A second incision was then made overlying the grommet and the remaining portion of the silicon intravascular piece as well as the rest of the PTFE portion is now dissected free.  The en bloc specimen as well as the grommet are then passed off the field completing the specimen.  The shoulder wound is then closed using 2 layers of 3-0 Vicryl followed by 4-0 Monocryl subcuticular.   Hemostasis is obtained in the upper arm wound and then this wound is also closed with 2 layers of 3-0 Vicryl followed by surgical staples.  The the skin of the wound overlying the brachial artery is then reapproximated using 4-0 Monocryl.  Large bulky dressing was applied.   Dermabond is applied to the other incisions as a dressing.  Attention is then turned to the right femoral tunnel catheter.  The cuff is dissected free from the surrounding tissues with scissors and the catheter was removed en bloc.  Pressure is held.  Sterile dressing is applied.  COMPLICATIONS: None  CONDITION: Carlynn Purl, M.D. [facility] Office: [phone]   01/19/2019, 10:40 AM

## 2019-01-19 NOTE — Progress Notes (Signed)
Contacted ICU in reference to bed for patient, will contact me back when ready.

## 2019-01-19 NOTE — Progress Notes (Signed)
Dr. Rosey Bath aware heart rate 44 for a brief moment, then after patient aroused heart rate back to low 60's.  No further orders and have ICU bed of 19.

## 2019-01-19 NOTE — Anesthesia Post-op Follow-up Note (Signed)
Anesthesia QCDR form completed.        

## 2019-01-19 NOTE — Addendum Note (Signed)
Addendum  created 01/19/19 1702 by Martha Clan, MD   Clinical Note Signed

## 2019-01-19 NOTE — Transfer of Care (Signed)
Immediate Anesthesia Transfer of Care Note  Patient: Kelli Nguyen  Procedure(s) Performed: EXCISION LEFT HERO GRAFT (Left Arm Upper) INSERTION OF DIALYSIS CATHETER (Left Chest) REMOVAL OF A DIALYSIS CATHETER (Right Groin)  Patient Location: PACU  Anesthesia Type:General  Level of Consciousness: awake, alert  and oriented  Airway & Oxygen Therapy: Patient Spontanous Breathing and Patient connected to face mask oxygen  Post-op Assessment: Report given to RN and Post -op Vital signs reviewed and stable  Post vital signs: Reviewed and stable  Last Vitals:  Vitals Value Taken Time  BP 116/79 01/19/19 1120  Temp 36.3 C 01/19/19 1120  Pulse 67 01/19/19 1120  Resp    SpO2 94 % 01/19/19 1120  Vitals shown include unvalidated device data.  Last Pain:  Vitals:   01/19/19 1120  TempSrc: Temporal  PainSc: 0-No pain         Complications: No apparent anesthesia complications

## 2019-01-19 NOTE — Op Note (Deleted)
[facility] History & Physical Update  The patient was interviewed and re-examined.  The patient's previous History and Physical has been reviewed and is unchanged.  There is no change in the plan of care. We plan to proceed with the scheduled procedure.  Hortencia Pilar, MD  01/19/2019, 7:32 AM

## 2019-01-19 NOTE — Anesthesia Postprocedure Evaluation (Addendum)
Anesthesia Post Note  Patient: Kelli Nguyen  Procedure(s) Performed: EXCISION LEFT HERO GRAFT (Left Arm Upper) INSERTION OF DIALYSIS CATHETER (Left Chest) REMOVAL OF A DIALYSIS CATHETER (Right Groin)  Patient location during evaluation: PACU Anesthesia Type: General Level of consciousness: awake and alert Pain management: pain level controlled Vital Signs Assessment: post-procedure vital signs reviewed and stable Respiratory status: spontaneous breathing, nonlabored ventilation, respiratory function stable and patient connected to nasal cannula oxygen Cardiovascular status: stable Postop Assessment: no apparent nausea or vomiting Anesthetic complications: no Comments: Patient was found to be hypotensive in the PACU and was placed on a phenylephrine gtt, which improved the blood pressure.  We were unable to wean the phenylephrine despite some boluses of NS.  Plan was to go light on IVF as the patient is a dialysis patient and did not want to flood the patient with fluid.  Patient will go the ICU with plans of staying overnight until able to wean pressor.     Last Vitals:  Vitals:   01/19/19 1503 01/19/19 1605  BP: 121/65 (!) 100/49  Pulse: 62 82  Resp: 16 14  Temp:  36.6 C  SpO2: 97% 95%    Last Pain:  Vitals:   01/19/19 1605  TempSrc:   PainSc: 0-No pain                 Martha Clan

## 2019-01-19 NOTE — Progress Notes (Signed)
Dr. Delana Meyer aware of patient status, blood pressure and medication patient is on, ok to admit patient.

## 2019-01-19 NOTE — Progress Notes (Signed)
Dr. Rosey Bath aware bp 83/51, wants 250 fluid bolus and then start phenylephrine drip.

## 2019-01-19 NOTE — Anesthesia Preprocedure Evaluation (Signed)
Anesthesia Evaluation  Patient identified by MRN, date of birth, ID band Patient awake    Reviewed: Allergy & Precautions, H&P , NPO status , Patient's Chart, lab work & pertinent test results  History of Anesthesia Complications Negative for: history of anesthetic complications  Airway Mallampati: III  TM Distance: >3 FB Neck ROM: limited    Dental  (+) Poor Dentition, Chipped, Missing, Dental Advidsory Given   Pulmonary neg shortness of breath, neg COPD, Recent URI ,    Pulmonary exam normal        Cardiovascular Exercise Tolerance: Poor hypertension, (-) angina+ CAD  (-) DOE Normal cardiovascular exam(-) dysrhythmias (-) Valvular Problems/Murmurs     Neuro/Psych negative neurological ROS  negative psych ROS   GI/Hepatic Neg liver ROS, GERD  Controlled,  Endo/Other  diabetes, Type 2  Renal/GU Dialysis and ESRFRenal disease  negative genitourinary   Musculoskeletal   Abdominal   Peds  Hematology  (+) Blood dyscrasia, anemia ,   Anesthesia Other Findings Past Medical History:   Anemia                                                       Hypertension                                                 Diabetes mellitus without complication (HCC)                 Chronic kidney disease                                       GERD (gastroesophageal reflux disease)                       Coronary artery disease                                      Chronic cough                                                Dialysis patient ([place])                                         Comment:T-TH-SAT  Past Surgical History:   ABDOMINAL HYSTERECTOMY                                        CORONARY ARTERY BYPASS GRAFT                                  COLONOSCOPY  RENAL SHUNTS                                                  COLONOSCOPY WITH PROPOFOL                       N/A 11/28/2014     Comment:Procedure: COLONOSCOPY WITH PROPOFOL;  Surgeon:              Manya Silvas, MD;  Location: [facility]               ENDOSCOPY;  Service: Endoscopy;  Laterality:               N/A;   PERIPHERAL VASCULAR CATHETERIZATION             Left 01/18/2015      Comment:Procedure: A/V Shuntogram/Fistulagram;                Surgeon: Katha Cabal, MD;  Location: [place] [facility] LAB;  Service: Cardiovascular;                Laterality: Left;   PERIPHERAL VASCULAR CATHETERIZATION             Left 01/18/2015      Comment:Procedure: A/V Shunt Intervention;  Surgeon:               Katha Cabal, MD;  Location: [place]              CV LAB;  Service: Cardiovascular;  Laterality:               Left;   PERIPHERAL VASCULAR CATHETERIZATION             N/A 08/10/2015       Comment:Procedure: Thrombectomy;  Surgeon: Algernon Huxley,              MD;  Location: [place] [facility];  Service:               Cardiovascular;  Laterality: N/A;   PERIPHERAL VASCULAR CATHETERIZATION             N/A 08/10/2015       Comment:Procedure: A/V Shuntogram/Fistulagram;                Surgeon: Algernon Huxley, MD;  Location: [place] [facility];  Service: Cardiovascular;                Laterality: N/A;   PERIPHERAL VASCULAR CATHETERIZATION             N/A 08/10/2015       Comment:Procedure: A/V Shunt Intervention;  Surgeon:               Algernon Huxley, MD;  Location: [place] [facility]               LAB;  Service: Cardiovascular;  Laterality:               N/A;  BMI    Body Mass Index   37.34 kg/m 2      Reproductive/Obstetrics negative OB ROS  Anesthesia Physical  Anesthesia Plan  ASA: III  Anesthesia Plan: General   Post-op Pain Management:    Induction: Intravenous  PONV Risk Score and Plan: 3 and Ondansetron, Dexamethasone and Treatment may vary due to age or medical condition  Airway Management Planned: LMA  Additional  Equipment:   Intra-op Plan:   Post-operative Plan: Extubation in OR  Informed Consent: I have reviewed the patients History and Physical, chart, labs and discussed the procedure including the risks, benefits and alternatives for the proposed anesthesia with the patient or authorized representative who has indicated his/her understanding and acceptance.     Dental Advisory Given  Plan Discussed with: Anesthesiologist, CRNA and Surgeon  Anesthesia Plan Comments:         Anesthesia Quick Evaluation

## 2019-01-19 NOTE — H&P (Signed)
[facility] History & Physical Update  The patient was interviewed and re-examined.  The patient's previous History and Physical has been reviewed and is unchanged.  There is no change in the plan of care. We plan to proceed with the scheduled procedure.  This note was originally placed at 7:32 AM but under the wrong heading it was saved as an op note.  Hortencia Pilar, MD  01/19/2019, 7:32 AM

## 2019-01-19 NOTE — Anesthesia Procedure Notes (Signed)
Procedure Name: LMA Insertion Date/Time: 01/19/2019 7:50 AM Performed by: Allean Found, CRNA Pre-anesthesia Checklist: Patient identified, Patient being monitored, Timeout performed, Emergency Drugs available and Suction available Patient Re-evaluated:Patient Re-evaluated prior to induction Oxygen Delivery Method: Circle system utilized Preoxygenation: Pre-oxygenation with 100% oxygen Induction Type: IV induction Ventilation: Mask ventilation without difficulty LMA: LMA inserted LMA Size: 4.0 Tube type: Oral Number of attempts: 1 Placement Confirmation: positive ETCO2 and breath sounds checked- equal and bilateral Tube secured with: Tape Dental Injury: Teeth and Oropharynx as per pre-operative assessment

## 2019-01-19 NOTE — Consult Note (Addendum)
Name: [name] MRN: [id number] DOB: 03/20/1946    ADMISSION DATE:  01/19/2019 CONSULTATION DATE:  01/19/2019  REFERRING MD :  Dr. Delana Meyer  CHIEF COMPLAINT:  Post-op Hypotension  BRIEF PATIENT DESCRIPTION:  73 y.o. Female who underwent elective removal of infected Left arm HERO AV graft on 9/15 by Dr Delana Meyer.  While in PACU she became Hypotensive (BP 83/51), of which she was placed on Neo-synephrine and transferred to ICU.  Suspect Hypotension in setting anesthesia verses possible developing sepsis secondary to infected HERO graft.  SIGNIFICANT EVENTS  9/15>> Elective removal of infected Left Arm HERO graft 9/15>> Post-op hypotension requiring Neo-synephrine, transter to ICU  STUDIES:  N/A  CULTURES: SARS-CoV-2 PCR 9/11>>negative MRSA PCR 9/15>>negative Blood x2 9/15>>  ANTIBIOTICS: Clindamycin x1 dose 9/15 (surgical prophylaxis)  HISTORY OF PRESENT ILLNESS:   Kelli Nguyen is a 73 year old female with a past medical history of end-stage renal disease on HD, diabetes mellitus, anemia, GERD, hypertension, neuropathy who presented to [facility] for elective removal of infected left arm hero AV graft by Dr. Delana Meyer.  She also had a left chest dialysis catheter placed, and her right groin dialysis catheter was removed.  While in PACU she became hypotensive with blood pressure 83/51, of which she received 250 cc fluid bolus and was placed on Neo-Synephrine infusion.  She was transferred to ICU post procedure.  PCCM is consulted for further management of postop hypotension.    PAST MEDICAL HISTORY :   has a past medical history of Anemia, Chronic cough, Chronic kidney disease, Coronary artery disease, Diabetes mellitus without complication ([place]), Dialysis patient ([place]), GERD (gastroesophageal reflux disease), Hypertension, Neuropathy, and Wears dentures.  has a past surgical history that includes Abdominal hysterectomy; Coronary artery bypass graft; Colonoscopy; RENAL SHUNTS;  Colonoscopy with propofol (N/A, 11/28/2014); Cardiac catheterization (Left, 01/18/2015); Cardiac catheterization (Left, 01/18/2015); Cardiac catheterization (N/A, 08/10/2015); Cardiac catheterization (N/A, 08/10/2015); Cardiac catheterization (N/A, 08/10/2015); Carpal tunnel release (Left, 09/27/2015); Cataract extraction w/PHACO (Right, 09/23/2016); A/V SHUNTOGRAM (Left, 10/16/2016); A/V SHUNT INTERVENTION (N/A, 10/16/2016); Cataract extraction w/PHACO (Left, 01/20/2017); and PERIPHERAL VASCULAR THROMBECTOMY (Left, 01/12/2019). Prior to Admission medications   Medication Sig Start Date End Date Taking? Authorizing Provider  acetaminophen (TYLENOL) 500 MG tablet Tylenol Extra Strength 500 mg tablet  Take 2 tablets every 8 hours by oral route as needed.   Yes [provider]  aspirin 81 MG tablet Take 81 mg by mouth daily.   Yes [provider]  latanoprost (XALATAN) 0.005 % ophthalmic solution Place 1 drop into both eyes 2 (two) times daily.  07/28/15  Yes [provider]  pravastatin (PRAVACHOL) 20 MG tablet Take 20 mg by mouth daily.   Yes [provider]  ranitidine (ZANTAC) 150 MG tablet Take 150 mg by mouth 2 (two) times daily.   Yes [provider]  cinacalcet (SENSIPAR) 30 MG tablet Take 30 mg by mouth 3 (three) times a week. On dialysis days - Tues, Thurs, Sat    [provider]  DiphenhydrAMINE HCl, Sleep, 50 MG tablet Take 1 tablet by mouth 30 minutes before CT scan 11/08/15   [provider]  DUREZOL 0.05 % EMUL Place 1 drop into both eyes 2 (two) times daily. 09/20/16   [provider]  isosorbide mononitrate (IMDUR) 30 MG 24 hr tablet Take 30 mg by mouth daily.    [provider]  oxyCODONE-acetaminophen (PERCOCET) 5-325 MG tablet Take 1-2 tablets by mouth every 6 (six) hours as needed for moderate pain or  severe pain. 01/19/19 01/19/20  Schnier, Dolores Lory, MD  POLYETHYLENE GLYCOL 3350 PO as needed.     [provider]    Allergies  Allergen Reactions  . Codeine Sulfate Nausea Only  . Contrast Media [Iodinated Diagnostic Agents] Hives  . Gadobutrol Hives  . Gadolinium Derivatives Hives  . Penicillins Hives and Itching    Has patient had a PCN reaction causing immediate rash, facial/tongue/throat swelling, SOB or lightheadedness with hypotension: No Has patient had a PCN reaction causing severe rash involving mucus membranes or skin necrosis: No Has patient had a PCN reaction that required hospitalization: No Has patient had a PCN reaction occurring within the last 10 years: No If all of the above answers are "NO", then may proceed with Cephalosporin use.   . Statins Other (See Comments)    muscle pain    FAMILY HISTORY:  family history is not on file. SOCIAL HISTORY:  reports that she has never smoked. She has never used smokeless tobacco. She reports that she does not drink alcohol or use drugs.   COVID-19 DISASTER DECLARATION:  FULL CONTACT PHYSICAL EXAMINATION WAS NOT POSSIBLE DUE TO TREATMENT OF COVID-19 AND  CONSERVATION OF PERSONAL PROTECTIVE EQUIPMENT, LIMITED EXAM FINDINGS INCLUDE-  Patient assessed or the symptoms described in the history of present illness.  In the context of the Global COVID-19 pandemic, which necessitated consideration that the patient might be at risk for infection with the SARS-CoV-2 virus that causes COVID-19, Institutional protocols and algorithms that pertain to the evaluation of patients at risk for COVID-19 are in a state of rapid change based on information released by regulatory bodies including the CDC and federal and state organizations. These policies and algorithms were followed during the patient's care while in hospital.  REVIEW OF SYSTEMS:  Positives in BOLD: Pt denies all complaints Constitutional: Negative for fever, chills, weight loss, malaise/fatigue and diaphoresis.  HENT: Negative for hearing loss, ear pain, nosebleeds, congestion, sore throat,  neck pain, tinnitus and ear discharge.   Eyes: Negative for blurred vision, double vision, photophobia, pain, discharge and redness.  Respiratory: Negative for cough, hemoptysis, sputum production, shortness of breath, wheezing and stridor.   Cardiovascular: Negative for chest pain, palpitations, orthopnea, claudication, leg swelling and PND.  Gastrointestinal: Negative for heartburn, nausea, vomiting, abdominal pain, diarrhea, constipation, blood in stool and melena.  Genitourinary: Negative for dysuria, urgency, frequency, hematuria and flank pain.  Musculoskeletal: Negative for myalgias, back pain, joint pain and falls.  Skin: Negative for itching and rash.  Neurological: Negative for dizziness, tingling, tremors, sensory change, speech change, focal weakness, seizures, loss of consciousness, weakness and headaches.  Endo/Heme/Allergies: Negative for environmental allergies and polydipsia. Does not bruise/bleed easily.  SUBJECTIVE:  -Denies chest pain, shortness of breath, abdominal pain, nausea, vomiting, fever/chills, palpitations, lightheadedness, dizziness -Requiring 20 mcg Neosynephrine -On 2L [place]  VITAL SIGNS: Temp:  [94.9 F (34.9 C)-98.8 F (37.1 C)] 98.1 F (36.7 C) (09/15 1938) Pulse Rate:  [46-82] 74 (09/15 1938) Resp:  [12-29] 15 (09/15 1938) BP: (83-155)/(33-82) 103/49 (09/15 1938) SpO2:  [92 %-100 %] 93 % (09/15 1938) Weight:  [80.3 kg] 80.3 kg (09/15 0614)  PHYSICAL EXAMINATION: General:  Chronically ill appearing female, sitting in bed, 2L [place], in no acute distress Neuro:  Awake, A&O x4, follows commands, no focal deficits, speech clear HEENT:  Atraumatic, normocephalic, neck supple, no JVD Cardiovascular:  Regular rate & rhythm, s1s2, no M/R/G, 2+ pulses Lungs:  Clear to auscultation bilaterally, no wheezing, even, nonlabored, normal effort,  no assessory muscle use Abdomen:  Obese, soft, nontender, nondistended, no guarding or rebound tenderness, BS+ x4  Musculoskeletal:  No deformities, normal bulk and tone Skin:  Warm and dry, no obvious rashes, lesions, or ulcerations  Recent Labs  Lab 01/19/19 0623  NA 140  K 4.6  CL 96*  CO2 29  BUN 66*  CREATININE 10.33*  GLUCOSE 132*   Recent Labs  Lab 01/19/19 0623  HGB 10.8*  HCT 34.4*  WBC 11.1*  PLT 220   Dg C-arm 1-60 Min-no Report  Result Date: 01/19/2019 Fluoroscopy was utilized by the requesting physician.  No radiographic interpretation.   Vas US Duplex Dialysis Access (avf,avg)  Result Date: 01/18/2019 DIALYSIS ACCESS Access Site: Left Upper Extremity. Access Type: Hero. History: H/O Left HeRo graft;          10/16/16-PTA of venous portion of graft. Comparison Study: 07/15/2018 Performing Technologist: Almira Coaster RVS Supporting Technologist: Blondell Reveal RT, RDMS, RVT  Examination Guidelines: A complete evaluation includes B-mode imaging, spectral Doppler, color Doppler, and power Doppler as needed of all accessible portions of each vessel. Unilateral testing is considered an integral part of a complete examination. Limited examinations for reoccurring indications may be performed as noted.  Findings:   +--------------------+----------+-----------------+-------------+ AVG                 PSV (cm/s)Flow Vol (mL/min)  Describe    +--------------------+----------+-----------------+-------------+ Native artery inflow    95                     Biphasic Flow +--------------------+----------+-----------------+-------------+ Prox graft                                       Occluded    +--------------------+----------+-----------------+-------------+ Mid graft                                        Occluded    +--------------------+----------+-----------------+-------------+ Distal graft                                     Occluded    +--------------------+----------+-----------------+-------------+  +--------------+-------------+---------+---------+----------+------------------+               Diameter (cm)  Depth  BranchingPSV (cm/s)   Flow Volume                                  (cm)                           (ml/min)      +--------------+-------------+---------+---------+----------+------------------+ Lt Rad Art                                       60                       Dist                                                                      +--------------+-------------+---------+---------+----------+------------------+                                                                           +--------------+-------------+---------+---------+----------+------------------+  Summary: The Left Upper Arm Hero Graft appears to be occluded from Proximal segment extending upward.  *See table(s) above for measurements and observations.  Diagnosing physician: Hortencia Pilar MD Electronically signed by Hortencia Pilar MD on 01/18/2019 at 5:25:42 PM.    --------------------------------------------------------------------------------   Final     ASSESSMENT / PLAN:  Hypotension, likely secondary to anesthesia vs. ? Developing sepsis in setting of infected Left Arm HERO AV graft  Hx: HTN, CAD -Continuous cardiac monitoring -Maintain MAP >60 (given HD) -Received 250 cc bolus (cautious IVF resuscitation given ESRD on HD) -Neo-synephrine as needed to maintain MAP goal  Infected Left Arm HERO AV graft -Vascular surgery following, appreciate input -s/p Removal of infected Hero graft 9/15 -Monitor fever curve -Trend WBC's and Procalcitonin -Follow cultures as above -Procalcitonin 0.26 ~ will hold of on empiric antibiotics for now  ESRD on HD -Monitor I&O's / urinary output -Follow BMP -Ensure adequate renal perfusion -Avoid nephrotoxic agents as able -Replace electrolytes as indicated -Nephrology consulted, appreciate input -HD as per Nephrology -Vascular  Surgery placed new Left IJ tunneled HD access  Anemia of Chronic Disease -Monitor for S/Sx of bleeding -Trend CBC -SCD's for VTE Prophylaxis  -Transfuse for Hgb <7         DISPOSITION: ICU GOALS OF CARE: FULL CODE VTE PROPHYLAXIS: SCD's UPDATES: UPDATED PT AT BEDSIDE 01/19/19  Darel Hong, AGACNP-BC [facility] Pager: [phone] Cell: [phone]  01/19/2019, 7:54 PM

## 2019-01-19 NOTE — OR Nursing (Signed)
Informed daughter via phone call, pt would be admitted to ICU for BP issues.  Daughter stated understanding and had no questions.

## 2019-01-20 ENCOUNTER — Other Ambulatory Visit (INDEPENDENT_AMBULATORY_CARE_PROVIDER_SITE_OTHER): Payer: Self-pay | Admitting: Vascular Surgery

## 2019-01-20 ENCOUNTER — Observation Stay: Payer: Medicare Other

## 2019-01-20 DIAGNOSIS — R6521 Severe sepsis with septic shock: Secondary | ICD-10-CM | POA: Diagnosis not present

## 2019-01-20 DIAGNOSIS — T827XXA Infection and inflammatory reaction due to other cardiac and vascular devices, implants and grafts, initial encounter: Principal | ICD-10-CM

## 2019-01-20 DIAGNOSIS — A419 Sepsis, unspecified organism: Secondary | ICD-10-CM

## 2019-01-20 LAB — BASIC METABOLIC PANEL
Anion gap: 12 (ref 5–15)
BUN: 72 mg/dL — ABNORMAL HIGH (ref 8–23)
CO2: 25 mmol/L (ref 22–32)
Calcium: 7.6 mg/dL — ABNORMAL LOW (ref 8.9–10.3)
Chloride: 104 mmol/L (ref 98–111)
Creatinine, Ser: 11.72 mg/dL — ABNORMAL HIGH (ref 0.44–1.00)
GFR calc Af Amer: 3 mL/min — ABNORMAL LOW (ref >60)
GFR calc non Af Amer: 3 mL/min — ABNORMAL LOW (ref >60)
Glucose, Bld: 97 mg/dL (ref 70–99)
Potassium: 5.9 mmol/L — ABNORMAL HIGH (ref 3.5–5.1)
Sodium: 141 mmol/L (ref 135–145)

## 2019-01-20 LAB — ABO/RH: ABO/RH(D): A POS

## 2019-01-20 LAB — CBC
HCT: 25.8 % — ABNORMAL LOW (ref 36.0–46.0)
Hemoglobin: 8.1 g/dL — ABNORMAL LOW (ref 12.0–15.0)
MCH: 27.4 pg (ref 26.0–34.0)
MCHC: 31.4 g/dL (ref 30.0–36.0)
MCV: 87.2 fL (ref 80.0–100.0)
Platelets: 198 10*3/uL (ref 150–400)
RBC: 2.96 MIL/uL — ABNORMAL LOW (ref 3.87–5.11)
RDW: 14.2 % (ref 11.5–15.5)
WBC: 9.7 10*3/uL (ref 4.0–10.5)
nRBC: 0 % (ref 0.0–0.2)

## 2019-01-20 LAB — TROPONIN I (HIGH SENSITIVITY)
Troponin I (High Sensitivity): 31 ng/L — ABNORMAL HIGH (ref <18)
Troponin I (High Sensitivity): 31 ng/L — ABNORMAL HIGH (ref <18)

## 2019-01-20 LAB — CORTISOL: Cortisol, Plasma: 16.2 ug/dL

## 2019-01-20 LAB — PROCALCITONIN: Procalcitonin: 0.26 ng/mL

## 2019-01-20 MED ORDER — DEXTROSE 50 % IV SOLN
1.0000 | Freq: Once | INTRAVENOUS | Status: AC
  Administered 2019-01-20: 07:00:00 50 mL via INTRAVENOUS
  Filled 2019-01-20: qty 50

## 2019-01-20 MED ORDER — CALCIUM GLUCONATE-NACL 1-0.675 GM/50ML-% IV SOLN
1.0000 g | Freq: Once | INTRAVENOUS | Status: AC
  Administered 2019-01-20: 07:00:00 1000 mg via INTRAVENOUS
  Filled 2019-01-20: qty 50

## 2019-01-20 MED ORDER — SODIUM POLYSTYRENE SULFONATE 15 GM/60ML PO SUSP
30.0000 g | Freq: Once | ORAL | Status: AC
  Administered 2019-01-20: 07:00:00 30 g via ORAL
  Filled 2019-01-20: qty 120

## 2019-01-20 MED ORDER — ALTEPLASE 2 MG IJ SOLR: 2.0000 mg | Freq: Once | INTRAMUSCULAR | Status: DC

## 2019-01-20 MED ORDER — INSULIN REGULAR HUMAN 100 UNIT/ML IJ SOLN
10.0000 [IU] | Freq: Once | INTRAMUSCULAR | Status: AC
  Administered 2019-01-20: 07:00:00 10 [IU] via INTRAVENOUS
  Filled 2019-01-20: qty 10

## 2019-01-20 MED ORDER — EPOETIN ALFA 10000 UNIT/ML IJ SOLN
4000.0000 [IU] | Freq: Once | INTRAMUSCULAR | Status: AC
  Administered 2019-01-21: 4000 [IU] via INTRAVENOUS

## 2019-01-20 MED ORDER — DIPHENHYDRAMINE HCL 25 MG PO CAPS
25.0000 mg | ORAL_CAPSULE | Freq: Four times a day (QID) | ORAL | Status: DC | PRN
  Administered 2019-01-20: 20:00:00 25 mg via ORAL
  Filled 2019-01-20 (×2): qty 1

## 2019-01-20 NOTE — Progress Notes (Signed)
Unsuccessful attempt to run HD Tx, will retry on 01/21/19 per Candiss Norse, MD.

## 2019-01-20 NOTE — Consult Note (Signed)
[address] [place], [place] [address] Phone [phone]. Fax [phone]  Date: 01/20/2019                  Patient Name:  Kelli Nguyen  MRN: [id number]  DOB: [birth year]  Age / Sex: 73 y.o., female         PCP: [facility]                 Service Requesting Consult: Veterinary surgeon, Dolores Lory, MD                 Reason for Consult: ESRD            History of Present Illness: Patient is a 73 y.o. female with medical problems of CAD, ESRD, who was admitted to [facility] on 01/19/2019.  She underwent removal of infected left arm hero graft Patient was transferred to ICU overnight for hypotension and placed on Neo-Synephrine drip.  The drip is now been weaned off.  Patient otherwise feels well.  She dialyzes at [place].  3 hours 15 minutes.  Now she has a  new left IJ PermCath Has trace amount of edema.  Labs show hyperkalemia  Medications: Outpatient medications: Medications Prior to Admission  Medication Sig Dispense Refill Last Dose  . acetaminophen (TYLENOL) 500 MG tablet Tylenol Extra Strength 500 mg tablet  Take 2 tablets every 8 hours by oral route as needed.   01/18/2019 at Unknown time  . aspirin 81 MG tablet Take 81 mg by mouth daily.   01/18/2019 at Unknown time  . latanoprost (XALATAN) 0.005 % ophthalmic solution Place 1 drop into both eyes 2 (two) times daily.    01/18/2019 at Unknown time  . pravastatin (PRAVACHOL) 20 MG tablet Take 20 mg by mouth daily.   01/18/2019 at Unknown time  . ranitidine (ZANTAC) 150 MG tablet Take 150 mg by mouth 2 (two) times daily.   01/18/2019 at Unknown time  . cinacalcet (SENSIPAR) 30 MG tablet Take 30 mg by mouth 3 (three) times a week. On dialysis days - [facility], Sat   01/16/2019  . DiphenhydrAMINE HCl, Sleep, 50 MG tablet Take 1 tablet by mouth 30 minutes before CT scan     . DUREZOL 0.05 % EMUL Place 1 drop into both eyes 2 (two) times daily.  0   . isosorbide mononitrate (IMDUR) 30 MG 24 hr  tablet Take 30 mg by mouth daily.     [name] POLYETHYLENE GLYCOL 3350 PO as needed.        Current medications: Current Facility-Administered Medications  Medication Dose Route Frequency Provider Last Rate Last Dose  . acetaminophen (TYLENOL) tablet 650 mg  650 mg Oral Q6H PRN Schnier, Dolores Lory, MD      . aspirin chewable tablet 81 mg  81 mg Oral Daily Schnier, Dolores Lory, MD   81 mg at 01/19/19 1643  . Chlorhexidine Gluconate Cloth 2 % PADS 6 each  6 each Topical Daily Flora Lipps, MD   6 each at 01/19/19 2234  . cinacalcet (SENSIPAR) tablet 30 mg  30 mg Oral Q T,Th,Sa-HD Schnier, Dolores Lory, MD      . Difluprednate 0.05 % EMUL 1 drop  1 drop Both Eyes BID Schnier, Dolores Lory, MD      . docusate sodium (COLACE) capsule 100 mg  100 mg Oral BID Delana Meyer Dolores Lory, MD   100 mg at 01/19/19 1643  . HYDROcodone-acetaminophen (NORCO/VICODIN) 5-325 MG per tablet 1  tablet  1 tablet Oral Q6H PRN Stegmayer, Kimberly A, PA-C      . isosorbide mononitrate (IMDUR) 24 hr tablet 30 mg  30 mg Oral Daily Schnier, Dolores Lory, MD      . latanoprost (XALATAN) 0.005 % ophthalmic solution 1 drop  1 drop Both Eyes BID Schnier, Dolores Lory, MD   1 drop at 01/19/19 2207  . ondansetron (ZOFRAN) tablet 4 mg  4 mg Oral Q6H PRN Schnier, Dolores Lory, MD       Or  . ondansetron [facility]) injection 4 mg  4 mg Intravenous Q6H PRN Schnier, Dolores Lory, MD      . phenylephrine (NEO-SYNEPHRINE) 10 mg in sodium chloride 0.9 % 250 mL (0.04 mg/mL) infusion  0-400 mcg/min Intravenous Titrated Schnier, Dolores Lory, MD 22.5 mL/hr at 01/20/19 0833 15 mcg/min at 01/20/19 0833  . polyethylene glycol (MIRALAX / GLYCOLAX) packet 17 g  17 g Oral Daily PRN Schnier, Dolores Lory, MD      . pravastatin (PRAVACHOL) tablet 20 mg  20 mg Oral Daily Schnier, Dolores Lory, MD   20 mg at 01/19/19 1936      Allergies: Allergies  Allergen Reactions  . Codeine Sulfate Nausea Only  . Contrast Media [Iodinated Diagnostic Agents] Hives  . Gadobutrol Hives  . Gadolinium  Derivatives Hives  . Penicillins Hives and Itching    Has patient had a PCN reaction causing immediate rash, facial/tongue/throat swelling, SOB or lightheadedness with hypotension: No Has patient had a PCN reaction causing severe rash involving mucus membranes or skin necrosis: No Has patient had a PCN reaction that required hospitalization: No Has patient had a PCN reaction occurring within the last 10 years: No If all of the above answers are "NO", then may proceed with Cephalosporin use.   . Statins Other (See Comments)    muscle pain      Past Medical History: Past Medical History:  Diagnosis Date  . Anemia   . Chronic cough   . Chronic kidney disease   . Coronary artery disease   . Diabetes mellitus without complication ([place])   . Dialysis patient ([place])    T-TH-SAT  . GERD (gastroesophageal reflux disease)   . Hypertension   . Neuropathy    hands  . Wears dentures    partial upper and lower     Past Surgical History: Past Surgical History:  Procedure Laterality Date  . A/V SHUNT INTERVENTION N/A 10/16/2016   Procedure: A/V Shunt Intervention;  Surgeon: Katha Cabal, MD;  Location: [facility];  Service: Cardiovascular;  Laterality: N/A;  . A/V SHUNTOGRAM Left 10/16/2016   Procedure: A/V Shuntogram;  Surgeon: Katha Cabal, MD;  Location: [facility];  Service: Cardiovascular;  Laterality: Left;  . ABDOMINAL HYSTERECTOMY    . CARPAL TUNNEL RELEASE Left 09/27/2015   Procedure: CARPAL TUNNEL RELEASE;  Surgeon: Leanor Kail, MD;  Location: [facility];  Service: Orthopedics;  Laterality: Left;  . CATARACT EXTRACTION W/PHACO Right 09/23/2016   Procedure: CATARACT EXTRACTION PHACO AND INTRAOCULAR LENS PLACEMENT (IOC) complicated diabetic right;  Surgeon: Ronnell Freshwater, MD;  Location: [facility];  Service: Ophthalmology;  Laterality: Right;  Diabetic - diet controlled Potassium draw before surgery  . CATARACT EXTRACTION W/PHACO  Left 01/20/2017   Procedure: CATARACT EXTRACTION PHACO AND INTRAOCULAR LENS PLACEMENT (IOC) LEFT DIABETIC;  Surgeon: Eulogio Bear, MD;  Location: [facility];  Service: Ophthalmology;  Laterality: Left;  Diabetic - diet controlled Potassium draw prior to  procedure  8:00 ARRIVAL  . COLONOSCOPY    . COLONOSCOPY WITH PROPOFOL N/A 11/28/2014   Procedure: COLONOSCOPY WITH PROPOFOL;  Surgeon: Manya Silvas, MD;  Location: [facility];  Service: Endoscopy;  Laterality: N/A;  . CORONARY ARTERY BYPASS GRAFT    . INSERTION OF DIALYSIS CATHETER Left 01/19/2019   Procedure: INSERTION OF DIALYSIS CATHETER;  Surgeon: Katha Cabal, MD;  Location: [facility];  Service: Vascular;  Laterality: Left;  . LIGATIONS OF HERO GRAFT Left 01/19/2019   Procedure: EXCISION LEFT HERO GRAFT;  Surgeon: Katha Cabal, MD;  Location: [facility];  Service: Vascular;  Laterality: Left;  . PERIPHERAL VASCULAR CATHETERIZATION Left 01/18/2015   Procedure: A/V Shuntogram/Fistulagram;  Surgeon: Katha Cabal, MD;  Location: [facility];  Service: Cardiovascular;  Laterality: Left;  . PERIPHERAL VASCULAR CATHETERIZATION Left 01/18/2015   Procedure: A/V Shunt Intervention;  Surgeon: Katha Cabal, MD;  Location: [facility];  Service: Cardiovascular;  Laterality: Left;  . PERIPHERAL VASCULAR CATHETERIZATION N/A 08/10/2015   Procedure: Thrombectomy;  Surgeon: Algernon Huxley, MD;  Location: [facility];  Service: Cardiovascular;  Laterality: N/A;  . PERIPHERAL VASCULAR CATHETERIZATION N/A 08/10/2015   Procedure: A/V Shuntogram/Fistulagram;  Surgeon: Algernon Huxley, MD;  Location: [facility];  Service: Cardiovascular;  Laterality: N/A;  . PERIPHERAL VASCULAR CATHETERIZATION N/A 08/10/2015   Procedure: A/V Shunt Intervention;  Surgeon: Algernon Huxley, MD;  Location: [facility];  Service: Cardiovascular;  Laterality: N/A;  . PERIPHERAL VASCULAR THROMBECTOMY Left 01/12/2019    Procedure: PERIPHERAL VASCULAR THROMBECTOMY;  Surgeon: Katha Cabal, MD;  Location: [facility];  Service: Cardiovascular;  Laterality: Left;  . REMOVAL OF A DIALYSIS CATHETER Right 01/19/2019   Procedure: REMOVAL OF A DIALYSIS CATHETER;  Surgeon: Katha Cabal, MD;  Location: [facility];  Service: Vascular;  Laterality: Right;  . RENAL SHUNTS       Family History: History reviewed. No pertinent family history.   Social History: Social History   Socioeconomic History  . Marital status: Widowed    Spouse name: Not on file  . Number of children: Not on file  . Years of education: Not on file  . Highest education level: Not on file  Occupational History  . Not on file  Social Needs  . Financial resource strain: Not on file  . Food insecurity    Worry: Not on file    Inability: Not on file  . Transportation needs    Medical: Not on file    Non-medical: Not on file  Tobacco Use  . Smoking status: Never Smoker  . Smokeless tobacco: Never Used  Substance and Sexual Activity  . Alcohol use: No  . Drug use: No  . Sexual activity: Not on file  Lifestyle  . Physical activity    Days per week: Not on file    Minutes per session: Not on file  . Stress: Not on file  Relationships  . Social Herbalist on phone: Not on file    Gets together: Not on file    Attends religious service: Not on file    Active member of club or organization: Not on file    Attends meetings of clubs or organizations: Not on file    Relationship status: Not on file  . Intimate partner violence    Fear of current or ex partner: Not on file    Emotionally abused: Not on file  Physically abused: Not on file    Forced sexual activity: Not on file  Other Topics Concern  . Not on file  Social History Narrative  . Not on file    Gen: Denies any fevers or chills HEENT: No vision or hearing problems CV: No chest pain or shortness of breath Resp: No cough or sputum  production GI: No nausea, vomiting or diarrhea.  No blood in the stool GU : No c/o MS: no c/o Derm:   No complaints Psych: No complaints Heme: No complaints Neuro: No complaints Endocrine: No complaints  Vital Signs: Blood pressure (!) 98/54, pulse 84, temperature 98.9 F (37.2 C), temperature source Oral, resp. rate 14, height 4\' 11"  (1.499 m), weight 86.2 kg, SpO2 94 %.   Intake/Output Summary (Last 24 hours) at 01/20/2019 0941 Last data filed at 01/20/2019 1610 Gross per 24 hour  Intake 1631.39 ml  Output -  Net 1631.39 ml    Weight trends: [employer]   01/19/19 9604 01/20/19 0440  Weight: 80.3 kg 86.2 kg   Gen:   Alert, cooperative, no distress, appears stated age Head:   Normocephalic, without obvious abnormality, atraumatic Eyes/ENT:  conjunctiva/corneas clear,  moist oral mucus membranes Neck:  Supple, no JVD Lungs:   Clear to auscultation bilaterally, respirations unlabored Heart:   Regular rate and rhythm,  Abdomen:   Soft, non-tender,  Extremities: no cyanosis , trace edema Skin:  Skin color, texture, turgor normal, no rashes or lesions Neurologic: Alert and oriented, able to answer questions appropriately Left IJ permcath    Lab results: Basic Metabolic Panel: Recent Labs  Lab 01/19/19 0623 01/20/19 0456  NA 140 141  K 4.6 5.9*  CL 96* 104  CO2 29 25  GLUCOSE 132* 97  BUN 66* 72*  CREATININE 10.33* 11.72*  CALCIUM 8.9 7.6*    Liver Function Tests: No results for input(s): AST, ALT, ALKPHOS, BILITOT, PROT, ALBUMIN in the last 168 hours. No results for input(s): LIPASE, AMYLASE in the last 168 hours. No results for input(s): AMMONIA in the last 168 hours.  CBC: Recent Labs  Lab 01/19/19 0623 01/19/19 2150 01/20/19 0456  WBC 11.1* 9.6 9.7  NEUTROABS 7.3 6.3  --   HGB 10.8* 8.4* 8.1*  HCT 34.4* 26.9* 25.8*  MCV 87.5 87.3 87.2  PLT 220 193 198    Cardiac Enzymes: No results for input(s): CKTOTAL, TROPONINI in the last 168  hours.  BNP: Invalid input(s): POCBNP  CBG: Recent Labs  Lab 01/19/19 0637 01/19/19 1144 01/19/19 1428  GLUCAP 129* 129* 95    Microbiology: Recent Results (from the past 720 hour(s))  SARS Coronavirus 2 [facility] order, Performed in [facility] hospital lab) Nasopharyngeal Nasopharyngeal Swab     Status: None   Collection Time: 01/12/19  9:55 AM   Specimen: Nasopharyngeal Swab  Result Value Ref Range Status   SARS Coronavirus 2 NEGATIVE NEGATIVE Final    Comment: (NOTE) If result is NEGATIVE SARS-CoV-2 target nucleic acids are NOT DETECTED. The SARS-CoV-2 RNA is generally detectable in upper and lower  respiratory specimens during the acute phase of infection. The lowest  concentration of SARS-CoV-2 viral copies this assay can detect is 250  copies / mL. A negative result does not preclude SARS-CoV-2 infection  and should not be used as the sole basis for treatment or other  patient management decisions.  A negative result may occur with  improper specimen collection / handling, submission of specimen other  than nasopharyngeal swab, presence of viral  mutation(s) within the  areas targeted by this assay, and inadequate number of viral copies  (<250 copies / mL). A negative result must be combined with clinical  observations, patient history, and epidemiological information. If result is POSITIVE SARS-CoV-2 target nucleic acids are DETECTED. The SARS-CoV-2 RNA is generally detectable in upper and lower  respiratory specimens dur ing the acute phase of infection.  Positive  results are indicative of active infection with SARS-CoV-2.  Clinical  correlation with patient history and other diagnostic information is  necessary to determine patient infection status.  Positive results do  not rule out bacterial infection or co-infection with other viruses. If result is PRESUMPTIVE POSTIVE SARS-CoV-2 nucleic acids MAY BE PRESENT.   A presumptive positive result was obtained on  the submitted specimen  and confirmed on repeat testing.  While 2019 novel coronavirus  (SARS-CoV-2) nucleic acids may be present in the submitted sample  additional confirmatory testing may be necessary for epidemiological  and / or clinical management purposes  to differentiate between  SARS-CoV-2 and other Sarbecovirus currently known to infect humans.  If clinically indicated additional testing with an alternate test  methodology [phone]) is advised. The SARS-CoV-2 RNA is generally  detectable in upper and lower respiratory sp ecimens during the acute  phase of infection. The expected result is Negative. Fact Sheet for Patients:  StrictlyIdeas.no Fact Sheet for Healthcare Providers: BankingDealers.co.za This test is not yet approved or cleared by the [place] FDA and has been authorized for detection and/or diagnosis of SARS-CoV-2 by FDA under an Emergency Use Authorization (EUA).  This EUA will remain in effect (meaning this test can be used) for the duration of the COVID-19 declaration under Section 564(b)(1) of the Act, 21 U.S.C. section 360bbb-3(b)(1), unless the authorization is terminated or revoked sooner. Performed at [facility], [place], [place] [address]   SARS CORONAVIRUS 2 (TAT 6-24 HRS) Nasopharyngeal Nasopharyngeal Swab     Status: None   Collection Time: 01/15/19 10:17 AM   Specimen: Nasopharyngeal Swab  Result Value Ref Range Status   SARS Coronavirus 2 NEGATIVE NEGATIVE Final    Comment: (NOTE) SARS-CoV-2 target nucleic acids are NOT DETECTED. The SARS-CoV-2 RNA is generally detectable in upper and lower respiratory specimens during the acute phase of infection. Negative results do not preclude SARS-CoV-2 infection, do not rule out co-infections with other pathogens, and should not be used as the sole basis for treatment or other patient management decisions. Negative results must  be combined with clinical observations, patient history, and epidemiological information. The expected result is Negative. Fact Sheet for Patients: SugarRoll.be Fact Sheet for Healthcare Providers: https://www.woods-mathews.com/ This test is not yet approved or cleared by the [place] FDA and  has been authorized for detection and/or diagnosis of SARS-CoV-2 by FDA under an Emergency Use Authorization (EUA). This EUA will remain  in effect (meaning this test can be used) for the duration of the COVID-19 declaration under Section 56 4(b)(1) of the Act, 21 U.S.C. section 360bbb-3(b)(1), unless the authorization is terminated or revoked sooner. Performed at [facility], [place] [address], [place], [place] [address]   MRSA PCR Screening     Status: None   Collection Time: 01/19/19  2:31 PM   Specimen: Nasal Mucosa; Nasopharyngeal  Result Value Ref Range Status   MRSA by PCR NEGATIVE NEGATIVE Final    Comment:        The GeneXpert MRSA Assay (FDA approved for NASAL specimens only), is one  component of a comprehensive MRSA colonization surveillance program. It is not intended to diagnose MRSA infection nor to guide or monitor treatment for MRSA infections. Performed at [facility], [address] [place], [place] [address]   CULTURE, BLOOD (ROUTINE X 2) w Reflex to ID Panel     Status: None (Preliminary result)   Collection Time: 01/19/19  8:24 PM   Specimen: BLOOD  Result Value Ref Range Status   Specimen Description BLOOD BLOOD LEFT HAND  Final   Special Requests   Final    BOTTLES DRAWN AEROBIC AND ANAEROBIC Blood Culture adequate volume   Culture   Final    NO GROWTH < 12 HOURS Performed at [facility], [address] [place], [place] [address]    Report Status PENDING  Incomplete  CULTURE, BLOOD (ROUTINE X 2) w Reflex to ID Panel     Status: None (Preliminary result)   Collection Time: 01/19/19   9:49 PM   Specimen: BLOOD  Result Value Ref Range Status   Specimen Description BLOOD BLOOD LEFT HAND  Final   Special Requests   Final    BOTTLES DRAWN AEROBIC ONLY Blood Culture adequate volume   Culture   Final    NO GROWTH < 12 HOURS Performed at [facility], [address], [place], [place] [address]    Report Status PENDING  Incomplete     Coagulation Studies: Recent Labs    01/19/19 0623  LABPROT 13.3  INR 1.0    Urinalysis: No results for input(s): COLORURINE, LABSPEC, PHURINE, GLUCOSEU, HGBUR, BILIRUBINUR, KETONESUR, PROTEINUR, UROBILINOGEN, NITRITE, LEUKOCYTESUR in the last 72 hours.  Invalid input(s): APPERANCEUR      Imaging: Dg C-arm 1-60 Min-no Report  Result Date: 01/19/2019 Fluoroscopy was utilized by the requesting physician.  No radiographic interpretation.   Vas US Duplex Dialysis Access (avf,avg)  Result Date: 01/18/2019 DIALYSIS ACCESS Access Site: Left Upper Extremity. Access Type: Hero. History: H/O Left HeRo graft;          10/16/16-PTA of venous portion of graft. Comparison Study: 07/15/2018 Performing Technologist: Almira Coaster RVS Supporting Technologist: Blondell Reveal RT, RDMS, RVT  Examination Guidelines: A complete evaluation includes B-mode imaging, spectral Doppler, color Doppler, and power Doppler as needed of all accessible portions of each vessel. Unilateral testing is considered an integral part of a complete examination. Limited examinations for reoccurring indications may be performed as noted.  Findings:   +--------------------+----------+-----------------+-------------+ AVG                 PSV (cm/s)Flow Vol (mL/min)  Describe    +--------------------+----------+-----------------+-------------+ Native artery inflow    95                     Biphasic Flow +--------------------+----------+-----------------+-------------+ Prox graft                                       Occluded     +--------------------+----------+-----------------+-------------+ Mid graft                                        Occluded    +--------------------+----------+-----------------+-------------+ Distal graft  Occluded    +--------------------+----------+-----------------+-------------+ +--------------+-------------+---------+---------+----------+------------------+               Diameter (cm)  Depth  BranchingPSV (cm/s)   Flow Volume                                  (cm)                           (ml/min)      +--------------+-------------+---------+---------+----------+------------------+ Lt Rad Art                                       60                       Dist                                                                      +--------------+-------------+---------+---------+----------+------------------+                                                                           +--------------+-------------+---------+---------+----------+------------------+  Summary: The Left Upper Arm Hero Graft appears to be occluded from Proximal segment extending upward.  *See table(s) above for measurements and observations.  Diagnosing physician: Hortencia Pilar MD Electronically signed by Hortencia Pilar MD on 01/18/2019 at 5:25:42 PM.    --------------------------------------------------------------------------------   Final       Assessment & Plan: Pt is a 73 y.o.   female with HTN, CAD h/o CABG 09/2006, ESRD , was admitted on 01/19/2019 for excision of left HERO graft and placement of left IJ PC  [address]/ [facility] Neph/ #:15 min  1. ESRD 2. Anemia of CKD 3. Complication of dialysis access 4. Hyperkalemia  Plan: dialysis today Low K bath EPO with HD        LOS: 1 Taygen Newsome 9/16/20209:41 AM    Note: This note was prepared with Dragon dictation. Any transcription errors are unintentional

## 2019-01-20 NOTE — Care Management CC44 (Signed)
Condition Code 44 Documentation Completed  Patient Details  Name: Kelli Nguyen MRN: [id number] Date of Birth: Oct 01, 1945   Condition Code 44 given:  Yes Patient signature on Condition Code 44 notice:  Yes Documentation of 2 MD's agreement:  Yes Code 44 added to claim:  Yes    Candie Chroman, LCSW 01/20/2019, 3:33 PM

## 2019-01-20 NOTE — Discharge Summary (Addendum)
[facility]    Discharge Summary  Patient ID:  Kelli Nguyen MRN: [id number] DOB/AGE: Oct 25, 1945 73 y.o.  Admit date: 01/19/2019 Discharge date: 01/21/2019 Date of Surgery: 01/19/2019 Surgeon: Surgeon(s): Algernon Huxley, MD  Admission Diagnosis: Infected prosthetic vascular graft ([place]) [T82.7XXA]  Discharge Diagnoses:  Infected prosthetic vascular graft ([place]) [Y86.7XXA]  Secondary Diagnoses: Past Medical History:  Diagnosis Date  . Anemia   . Chronic cough   . Chronic kidney disease   . Coronary artery disease   . Diabetes mellitus without complication ([place])   . Dialysis patient ([place])    T-TH-SAT  . GERD (gastroesophageal reflux disease)   . Hypertension   . Neuropathy    hands  . Wears dentures    partial upper and lower   Procedure(s): EXCISION LEFT HERO GRAFT INSERTION OF DIALYSIS CATHETER REMOVAL OF A DIALYSIS CATHETER  Discharged Condition: good  HPI / Hospital Course:  Kelli Nguyen is a 73 y.o. female is S/P: Procedure(s): EXCISION LEFT HERO GRAFT INSERTION OF DIALYSIS CATHETER REMOVAL OF A DIALYSIS CATHETER  Kelli Nguyen is a 72 y.o. female who presented with an infected AV access.  Given the prosthetic material this will require excision. The risks and benefits of been reviewed all questions are answered patient has agreed to proceed.  On January 19, 2019 the patient underwent: 1. Removal of infected left arm hero AV graft 2. Placement of a left IJ tunneled catheter same venous access 3. Removal of the right femoral tunneled catheter  She tolerated the procedure well was transferred to the recovery room without issue.  During her brief recovery room stay the patient required the use of pressors to maintain adequate blood pressure.  She was kept overnight and monitored in the ICU.  The patient was unable to dialyze through her PermCath postop day #1.  On postop day #2 the patient underwent:  1. Fluoroscopic guidance for  placement of catheter 2. Placement of a 27 cm tip to cuff tunneled hemodialysis catheter via the left internal jugular vein and removal of previous catheter  POD#2 /day of discharge, the patient was completely weaned from pressors and was maintaining an appropriate blood pressure.  Her diet was advanced, her pain was controlled with the use of p.o. pain medication and she was ambulating at baseline. She was able to dialyze appropriately from her permcath. At discharge, she was afebrile with stable vital signs.  Extubated: POD # 0  Physical exam:  A&Ox3, NAD CV: RRR Chest: Left IJ Permcath intact, clean and dry Pulm: CTA Bilaterally Abdomen: Soft, nontender, nondistended, positive bowel sounds Left upper extremity: Superior/distal incision with Dermabond clean and dry.  No swelling or drainage.  Forearm incision -staples clean and dry without swelling or drainage.  Labs as below  Complications: None  Consults: None  Significant Diagnostic Studies: CBC Lab Results  Component Value Date   WBC 7.8 01/21/2019   HGB 7.2 (L) 01/21/2019   HCT 23.1 (L) 01/21/2019   MCV 87.2 01/21/2019   PLT 146 (L) 01/21/2019   BMET    Component Value Date/Time   NA 142 01/21/2019 0504   NA 135 (L) 03/29/2013 1409   K 5.2 (H) 01/21/2019 0504   K 4.5 03/29/2013 1409   CL 104 01/21/2019 0504   CL 97 (L) 03/29/2013 1409   CO2 25 01/21/2019 0504   CO2 27 03/29/2013 1409   GLUCOSE 102 (H) 01/21/2019 0504   GLUCOSE 97 03/29/2013 1409   BUN 72 (  H) 01/21/2019 0504   BUN 48 (H) 03/29/2013 1409   CREATININE 12.69 (H) 01/21/2019 0504   CREATININE 12.06 (H) 03/29/2013 1409   CALCIUM 7.8 (L) 01/21/2019 0504   CALCIUM 8.8 03/29/2013 1409   GFRNONAA 3 (L) 01/21/2019 0504   GFRNONAA 3 (L) 03/29/2013 1409   GFRAA 3 (L) 01/21/2019 0504   GFRAA 3 (L) 03/29/2013 1409   COAG Lab Results  Component Value Date   INR 1.0 01/19/2019   Disposition:  Discharge to :Home Discharge Instructions    Call MD  for:  redness, tenderness, or signs of infection (pain, swelling, bleeding, redness, odor or green/yellow discharge around incision site)   Complete by: As directed    Call MD for:  severe or increased pain, loss or decreased feeling  in affected limb(s)   Complete by: As directed    Call MD for:  temperature >100.5   Complete by: As directed    Discharge instructions   Complete by: As directed    Okay to use left IJ tunneled catheter for dialysis   Lifting restrictions   Complete by: As directed    No lifting left arm for 2 weeks   No dressing needed   Complete by: As directed    Replace only if drainage present   Resume previous diet   Complete by: As directed      Allergies as of 01/21/2019      Reactions   Codeine Sulfate Nausea Only   Contrast Media [iodinated Diagnostic Agents] Hives   Gadobutrol Hives   Gadolinium Derivatives Hives   Penicillins Hives, Itching   Has patient had a PCN reaction causing immediate rash, facial/tongue/throat swelling, SOB or lightheadedness with hypotension: No Has patient had a PCN reaction causing severe rash involving mucus membranes or skin necrosis: No Has patient had a PCN reaction that required hospitalization: No Has patient had a PCN reaction occurring within the last 10 years: No If all of the above answers are "NO", then may proceed with Cephalosporin use.   Statins Other (See Comments)   muscle pain      Medication List    TAKE these medications   aspirin 81 MG tablet Take 81 mg by mouth daily.   cinacalcet 30 MG tablet Commonly known as: SENSIPAR Take 30 mg by mouth 3 (three) times a week. On dialysis days - Tues, Thurs, Sat   diphenhydrAMINE HCl (Sleep) 50 MG tablet Take 1 tablet by mouth 30 minutes before CT scan   Durezol 0.05 % Emul Generic drug: Difluprednate Place 1 drop into both eyes 2 (two) times daily.   isosorbide mononitrate 30 MG 24 hr tablet Commonly known as: IMDUR Take 30 mg by mouth daily.    latanoprost 0.005 % ophthalmic solution Commonly known as: XALATAN Place 1 drop into both eyes 2 (two) times daily.   oxyCODONE-acetaminophen 5-325 MG tablet Commonly known as: Percocet Take 1-2 tablets by mouth every 6 (six) hours as needed for moderate pain or severe pain.   POLYETHYLENE GLYCOL 3350 PO as needed.   pravastatin 20 MG tablet Commonly known as: PRAVACHOL Take 20 mg by mouth daily.   ranitidine 150 MG tablet Commonly known as: ZANTAC Take 150 mg by mouth 2 (two) times daily.   TYLENOL 500 MG tablet Generic drug: acetaminophen Tylenol Extra Strength 500 mg tablet  Take 2 tablets every 8 hours by oral route as needed.      Verbal and written Discharge instructions given to the patient. Wound care  per Discharge AVS Follow-up Information    Kris Hartmann, NP Follow up in 1 week(s).   Specialty: Vascular Surgery Why: First post-op incision check. Possible staple removal.  Contact information: [place] [address] [phone]          Signed: Sela Hua, PA-C  01/21/2019, 11:21 AM

## 2019-01-20 NOTE — Discharge Instructions (Signed)
Remove your dressing as of tomorrow. Keep covered with Kerlix.  Dermabond to fall off on its own

## 2019-01-21 ENCOUNTER — Encounter: Payer: Self-pay | Admitting: *Deleted

## 2019-01-21 ENCOUNTER — Encounter
Admission: AD | Disposition: A | Discharge status: Home / Self Care | Payer: Self-pay | Source: Home / Self Care | Attending: Vascular Surgery

## 2019-01-21 DIAGNOSIS — Z888 Allergy status to other drugs, medicaments and biological substances status: Secondary | ICD-10-CM | POA: Diagnosis not present

## 2019-01-21 DIAGNOSIS — I871 Compression of vein: Secondary | ICD-10-CM | POA: Diagnosis present

## 2019-01-21 DIAGNOSIS — T827XXA Infection and inflammatory reaction due to other cardiac and vascular devices, implants and grafts, initial encounter: Secondary | ICD-10-CM | POA: Diagnosis present

## 2019-01-21 DIAGNOSIS — E1140 Type 2 diabetes mellitus with diabetic neuropathy, unspecified: Secondary | ICD-10-CM | POA: Diagnosis present

## 2019-01-21 DIAGNOSIS — I120 Hypertensive chronic kidney disease with stage 5 chronic kidney disease or end stage renal disease: Secondary | ICD-10-CM | POA: Diagnosis present

## 2019-01-21 DIAGNOSIS — T80219A Unspecified infection due to central venous catheter, initial encounter: Secondary | ICD-10-CM | POA: Diagnosis present

## 2019-01-21 DIAGNOSIS — K219 Gastro-esophageal reflux disease without esophagitis: Secondary | ICD-10-CM | POA: Diagnosis present

## 2019-01-21 DIAGNOSIS — T82898A Other specified complication of vascular prosthetic devices, implants and grafts, initial encounter: Secondary | ICD-10-CM | POA: Diagnosis not present

## 2019-01-21 DIAGNOSIS — Z992 Dependence on renal dialysis: Secondary | ICD-10-CM

## 2019-01-21 DIAGNOSIS — Z880 Allergy status to penicillin: Secondary | ICD-10-CM | POA: Diagnosis not present

## 2019-01-21 DIAGNOSIS — I2510 Atherosclerotic heart disease of native coronary artery without angina pectoris: Secondary | ICD-10-CM | POA: Diagnosis present

## 2019-01-21 DIAGNOSIS — E875 Hyperkalemia: Secondary | ICD-10-CM | POA: Diagnosis present

## 2019-01-21 DIAGNOSIS — Z9841 Cataract extraction status, right eye: Secondary | ICD-10-CM | POA: Diagnosis not present

## 2019-01-21 DIAGNOSIS — Z885 Allergy status to narcotic agent status: Secondary | ICD-10-CM | POA: Diagnosis not present

## 2019-01-21 DIAGNOSIS — N186 End stage renal disease: Secondary | ICD-10-CM | POA: Diagnosis present

## 2019-01-21 DIAGNOSIS — Z90710 Acquired absence of both cervix and uterus: Secondary | ICD-10-CM | POA: Diagnosis not present

## 2019-01-21 DIAGNOSIS — Z7982 Long term (current) use of aspirin: Secondary | ICD-10-CM | POA: Diagnosis not present

## 2019-01-21 DIAGNOSIS — Y832 Surgical operation with anastomosis, bypass or graft as the cause of abnormal reaction of the patient, or of later complication, without mention of misadventure at the time of the procedure: Secondary | ICD-10-CM | POA: Diagnosis present

## 2019-01-21 DIAGNOSIS — Z961 Presence of intraocular lens: Secondary | ICD-10-CM | POA: Diagnosis present

## 2019-01-21 DIAGNOSIS — Z91041 Radiographic dye allergy status: Secondary | ICD-10-CM | POA: Diagnosis not present

## 2019-01-21 DIAGNOSIS — E1122 Type 2 diabetes mellitus with diabetic chronic kidney disease: Secondary | ICD-10-CM | POA: Diagnosis present

## 2019-01-21 DIAGNOSIS — Z951 Presence of aortocoronary bypass graft: Secondary | ICD-10-CM | POA: Diagnosis not present

## 2019-01-21 DIAGNOSIS — Z9842 Cataract extraction status, left eye: Secondary | ICD-10-CM | POA: Diagnosis not present

## 2019-01-21 DIAGNOSIS — I9581 Postprocedural hypotension: Secondary | ICD-10-CM | POA: Diagnosis not present

## 2019-01-21 DIAGNOSIS — D631 Anemia in chronic kidney disease: Secondary | ICD-10-CM | POA: Diagnosis present

## 2019-01-21 DIAGNOSIS — Z79899 Other long term (current) drug therapy: Secondary | ICD-10-CM | POA: Diagnosis not present

## 2019-01-21 DIAGNOSIS — Z972 Presence of dental prosthetic device (complete) (partial): Secondary | ICD-10-CM | POA: Diagnosis not present

## 2019-01-21 HISTORY — PX: DIALYSIS/PERMA CATHETER INSERTION: CATH118288

## 2019-01-21 LAB — CBC
HCT: 23.1 % — ABNORMAL LOW (ref 36.0–46.0)
Hemoglobin: 7.2 g/dL — ABNORMAL LOW (ref 12.0–15.0)
MCH: 27.2 pg (ref 26.0–34.0)
MCHC: 31.2 g/dL (ref 30.0–36.0)
MCV: 87.2 fL (ref 80.0–100.0)
Platelets: 146 10*3/uL — ABNORMAL LOW (ref 150–400)
RBC: 2.65 MIL/uL — ABNORMAL LOW (ref 3.87–5.11)
RDW: 14.5 % (ref 11.5–15.5)
WBC: 7.8 10*3/uL (ref 4.0–10.5)
nRBC: 0 % (ref 0.0–0.2)

## 2019-01-21 LAB — BASIC METABOLIC PANEL
Anion gap: 13 (ref 5–15)
BUN: 72 mg/dL — ABNORMAL HIGH (ref 8–23)
CO2: 25 mmol/L (ref 22–32)
Calcium: 7.8 mg/dL — ABNORMAL LOW (ref 8.9–10.3)
Chloride: 104 mmol/L (ref 98–111)
Creatinine, Ser: 12.69 mg/dL — ABNORMAL HIGH (ref 0.44–1.00)
GFR calc Af Amer: 3 mL/min — ABNORMAL LOW (ref >60)
GFR calc non Af Amer: 3 mL/min — ABNORMAL LOW (ref >60)
Glucose, Bld: 102 mg/dL — ABNORMAL HIGH (ref 70–99)
Potassium: 5.2 mmol/L — ABNORMAL HIGH (ref 3.5–5.1)
Sodium: 142 mmol/L (ref 135–145)

## 2019-01-21 LAB — PROCALCITONIN: Procalcitonin: 0.26 ng/mL

## 2019-01-21 SURGERY — DIALYSIS/PERMA CATHETER INSERTION
Anesthesia: Choice | Laterality: Left

## 2019-01-21 MED ORDER — MIDODRINE HCL 5 MG PO TABS: 10.0000 mg | ORAL_TABLET | Freq: Once | ORAL | Status: DC

## 2019-01-21 MED ORDER — FENTANYL CITRATE (PF) 100 MCG/2ML IJ SOLN
INTRAMUSCULAR | Status: AC
  Filled 2019-01-21: qty 2

## 2019-01-21 MED ORDER — CLINDAMYCIN PHOSPHATE 300 MG/50ML IV SOLN
INTRAVENOUS | Status: AC
  Filled 2019-01-21: qty 50

## 2019-01-21 MED ORDER — FAMOTIDINE 20 MG PO TABS: 40.0000 mg | ORAL_TABLET | Freq: Once | ORAL | Status: DC | PRN

## 2019-01-21 MED ORDER — HYDROMORPHONE HCL 1 MG/ML IJ SOLN: 1.0000 mg | Freq: Once | INTRAMUSCULAR | Status: DC | PRN

## 2019-01-21 MED ORDER — DIPHENHYDRAMINE HCL 50 MG/ML IJ SOLN: 50.0000 mg | Freq: Once | INTRAMUSCULAR | Status: DC | PRN

## 2019-01-21 MED ORDER — METHYLPREDNISOLONE SODIUM SUCC 125 MG IJ SOLR: 125.0000 mg | Freq: Once | INTRAMUSCULAR | Status: DC | PRN

## 2019-01-21 MED ORDER — CLINDAMYCIN PHOSPHATE 300 MG/50ML IV SOLN
300.0000 mg | Freq: Once | INTRAVENOUS | Status: DC
  Filled 2019-01-21: qty 50

## 2019-01-21 MED ORDER — FENTANYL CITRATE (PF) 100 MCG/2ML IJ SOLN
INTRAMUSCULAR | Status: DC | PRN
  Administered 2019-01-21: 25 ug via INTRAVENOUS
  Administered 2019-01-21: 50 ug via INTRAVENOUS
  Administered 2019-01-21: 25 ug via INTRAVENOUS

## 2019-01-21 MED ORDER — ONDANSETRON HCL 4 MG/2ML IJ SOLN: 4.0000 mg | Freq: Four times a day (QID) | INTRAMUSCULAR | Status: DC | PRN

## 2019-01-21 MED ORDER — MIDAZOLAM HCL 2 MG/ML PO SYRP
8.0000 mg | ORAL_SOLUTION | Freq: Once | ORAL | Status: DC | PRN
  Filled 2019-01-21: qty 4

## 2019-01-21 MED ORDER — HEPARIN SODIUM (PORCINE) 10000 UNIT/ML IJ SOLN
INTRAMUSCULAR | Status: AC
  Filled 2019-01-21: qty 1

## 2019-01-21 MED ORDER — SODIUM CHLORIDE 0.9 % IV SOLN: INTRAVENOUS | Status: DC

## 2019-01-21 MED ORDER — MIDAZOLAM HCL 5 MG/5ML IJ SOLN
INTRAMUSCULAR | Status: AC
  Filled 2019-01-21: qty 5

## 2019-01-21 MED ORDER — MIDAZOLAM HCL 2 MG/2ML IJ SOLN
INTRAMUSCULAR | Status: DC | PRN
  Administered 2019-01-21 (×3): 1 mg via INTRAVENOUS

## 2019-01-21 SURGICAL SUPPLY — 7 items
BIOPATCH RED 1 DISK 7.0 (GAUZE/BANDAGES/DRESSINGS) ×1 IMPLANT
BIOPATCH RED 1IN DISK 7.0MM (GAUZE/BANDAGES/DRESSINGS) ×1
CATH PALINDROME RT-P 15FX28CM (CATHETERS) ×2 IMPLANT
GUIDEWIRE SUPER STIFF .035X180 (WIRE) ×2 IMPLANT
PACK ANGIOGRAPHY (CUSTOM PROCEDURE TRAY) ×2 IMPLANT
SUT MNCRL AB 4-0 PS2 18 (SUTURE) ×2 IMPLANT
SUT PROLENE 0 CT 1 30 (SUTURE) ×2 IMPLANT

## 2019-01-21 NOTE — Progress Notes (Signed)
[facility], [place] 01/21/19  Subjective:   Patient seen during dialysis Tolerating well    HEMODIALYSIS FLOWSHEET:  Blood Flow Rate (mL/min): 400 mL/min Arterial Pressure (mmHg): -220 mmHg Venous Pressure (mmHg): 190 mmHg Transmembrane Pressure (mmHg): 50 mmHg Ultrafiltration Rate (mL/min): 180 mL/min Dialysate Flow Rate (mL/min): 600 ml/min Conductivity: Machine : 13.9 Conductivity: Machine : 13.9 Dialysis Fluid Bolus: Normal Saline Bolus Amount (mL): 200 mL    Objective:  Vital signs in last 24 hours:  Temp:  [98 F (36.7 C)-98.8 F (37.1 C)] 98.4 F (36.9 C) (09/17 1345) Pulse Rate:  [48-98] 78 (09/17 1515) Resp:  [12-28] 18 (09/17 1515) BP: (76-126)/(40-78) 107/64 (09/17 1515) SpO2:  [92 %-100 %] 98 % (09/17 1407) Weight:  [88.2 kg] 88.2 kg (09/17 0855)  Weight change: 2 kg Filed Weights   01/20/19 0440 01/21/19 0425 01/21/19 0855  Weight: 86.2 kg 88.2 kg 88.2 kg    Intake/Output:    Intake/Output Summary (Last 24 hours) at 01/21/2019 1533 Last data filed at 01/21/2019 1200 Gross per 24 hour  Intake 54.46 ml  Output 0 ml  Net 54.46 ml    Gen:                Alert, cooperative, no distress, appears stated age Head:               Normocephalic, without obvious abnormality, atraumatic Eyes/ENT:       conjunctiva/corneas clear,  moist oral mucus membranes Neck:               Supple, no JVD Lungs:             Clear to auscultation bilaterally, respirations unlabored Heart:              Regular rate and rhythm,  Abdomen:        Soft, non-tender,  Extremities:     no cyanosis , trace edema, left arm in bandage + edema Skin:                Skin color, texture, turgor normal, no rashes or lesions Neurologic:      Alert and oriented, able to answer questions appropriately Left IJ permcath  Basic Metabolic Panel:  Recent Labs  Lab 01/19/19 0623 01/20/19 0456 01/21/19 0504  NA 140 141 142  K 4.6 5.9* 5.2*  CL 96* 104 104   CO2 29 25 25   GLUCOSE 132* 97 102*  BUN 66* 72* 72*  CREATININE 10.33* 11.72* 12.69*  CALCIUM 8.9 7.6* 7.8*     CBC: Recent Labs  Lab 01/19/19 0623 01/19/19 2150 01/20/19 0456 01/21/19 0504  WBC 11.1* 9.6 9.7 7.8  NEUTROABS 7.3 6.3  --   --   HGB 10.8* 8.4* 8.1* 7.2*  HCT 34.4* 26.9* 25.8* 23.1*  MCV 87.5 87.3 87.2 87.2  PLT 220 193 198 146*     No results found for: HEPBSAG, HEPBSAB, HEPBIGM    Microbiology:  Recent Results (from the past 240 hour(s))  SARS Coronavirus 2 [facility] order, Performed in [facility] hospital lab) Nasopharyngeal Nasopharyngeal Swab     Status: None   Collection Time: 01/12/19  9:55 AM   Specimen: Nasopharyngeal Swab  Result Value Ref Range Status   SARS Coronavirus 2 NEGATIVE NEGATIVE Final    Comment: (NOTE) If result is NEGATIVE SARS-CoV-2 target nucleic acids are NOT DETECTED. The SARS-CoV-2 RNA is generally detectable in upper and lower  respiratory specimens during the acute phase of infection. The  lowest  concentration of SARS-CoV-2 viral copies this assay can detect is 250  copies / mL. A negative result does not preclude SARS-CoV-2 infection  and should not be used as the sole basis for treatment or other  patient management decisions.  A negative result may occur with  improper specimen collection / handling, submission of specimen other  than nasopharyngeal swab, presence of viral mutation(s) within the  areas targeted by this assay, and inadequate number of viral copies  (<250 copies / mL). A negative result must be combined with clinical  observations, patient history, and epidemiological information. If result is POSITIVE SARS-CoV-2 target nucleic acids are DETECTED. The SARS-CoV-2 RNA is generally detectable in upper and lower  respiratory specimens dur ing the acute phase of infection.  Positive  results are indicative of active infection with SARS-CoV-2.  Clinical  correlation with patient history and other  diagnostic information is  necessary to determine patient infection status.  Positive results do  not rule out bacterial infection or co-infection with other viruses. If result is PRESUMPTIVE POSTIVE SARS-CoV-2 nucleic acids MAY BE PRESENT.   A presumptive positive result was obtained on the submitted specimen  and confirmed on repeat testing.  While 2019 novel coronavirus  (SARS-CoV-2) nucleic acids may be present in the submitted sample  additional confirmatory testing may be necessary for epidemiological  and / or clinical management purposes  to differentiate between  SARS-CoV-2 and other Sarbecovirus currently known to infect humans.  If clinically indicated additional testing with an alternate test  methodology [phone]) is advised. The SARS-CoV-2 RNA is generally  detectable in upper and lower respiratory sp ecimens during the acute  phase of infection. The expected result is Negative. Fact Sheet for Patients:  StrictlyIdeas.no Fact Sheet for Healthcare Providers: BankingDealers.co.za This test is not yet approved or cleared by the [place] FDA and has been authorized for detection and/or diagnosis of SARS-CoV-2 by FDA under an Emergency Use Authorization (EUA).  This EUA will remain in effect (meaning this test can be used) for the duration of the COVID-19 declaration under Section 564(b)(1) of the Act, 21 U.S.C. section 360bbb-3(b)(1), unless the authorization is terminated or revoked sooner. Performed at [facility], [place], [place] [address]   SARS CORONAVIRUS 2 (TAT 6-24 HRS) Nasopharyngeal Nasopharyngeal Swab     Status: None   Collection Time: 01/15/19 10:17 AM   Specimen: Nasopharyngeal Swab  Result Value Ref Range Status   SARS Coronavirus 2 NEGATIVE NEGATIVE Final    Comment: (NOTE) SARS-CoV-2 target nucleic acids are NOT DETECTED. The SARS-CoV-2 RNA is generally detectable in upper  and lower respiratory specimens during the acute phase of infection. Negative results do not preclude SARS-CoV-2 infection, do not rule out co-infections with other pathogens, and should not be used as the sole basis for treatment or other patient management decisions. Negative results must be combined with clinical observations, patient history, and epidemiological information. The expected result is Negative. Fact Sheet for Patients: SugarRoll.be Fact Sheet for Healthcare Providers: https://www.woods-mathews.com/ This test is not yet approved or cleared by the [place] FDA and  has been authorized for detection and/or diagnosis of SARS-CoV-2 by FDA under an Emergency Use Authorization (EUA). This EUA will remain  in effect (meaning this test can be used) for the duration of the COVID-19 declaration under Section 56 4(b)(1) of the Act, 21 U.S.C. section 360bbb-3(b)(1), unless the authorization is terminated or revoked sooner. Performed at [facility], [place]  [address] [place], [place] [address]   MRSA PCR Screening     Status: None   Collection Time: 01/19/19  2:31 PM   Specimen: Nasal Mucosa; Nasopharyngeal  Result Value Ref Range Status   MRSA by PCR NEGATIVE NEGATIVE Final    Comment:        The GeneXpert MRSA Assay (FDA approved for NASAL specimens only), is one component of a comprehensive MRSA colonization surveillance program. It is not intended to diagnose MRSA infection nor to guide or monitor treatment for MRSA infections. Performed at [facility], [address] [place], [place] [address]   CULTURE, BLOOD (ROUTINE X 2) w Reflex to ID Panel     Status: None (Preliminary result)   Collection Time: 01/19/19  8:24 PM   Specimen: BLOOD  Result Value Ref Range Status   Specimen Description BLOOD BLOOD LEFT HAND  Final   Special Requests   Final    BOTTLES DRAWN AEROBIC AND ANAEROBIC Blood Culture  adequate volume   Culture   Final    NO GROWTH 2 DAYS Performed at [facility], [address] [place], [place] [address]    Report Status PENDING  Incomplete  CULTURE, BLOOD (ROUTINE X 2) w Reflex to ID Panel     Status: None (Preliminary result)   Collection Time: 01/19/19  9:49 PM   Specimen: BLOOD  Result Value Ref Range Status   Specimen Description BLOOD BLOOD LEFT HAND  Final   Special Requests   Final    BOTTLES DRAWN AEROBIC ONLY Blood Culture adequate volume   Culture   Final    NO GROWTH 2 DAYS Performed at [facility], [address], [place], [place] [address]    Report Status PENDING  Incomplete    Coagulation Studies: Recent Labs    01/19/19 0623  LABPROT 13.3  INR 1.0    Urinalysis: No results for input(s): COLORURINE, LABSPEC, PHURINE, GLUCOSEU, HGBUR, BILIRUBINUR, KETONESUR, PROTEINUR, UROBILINOGEN, NITRITE, LEUKOCYTESUR in the last 72 hours.  Invalid input(s): APPERANCEUR    Imaging: Dg Chest 1 View  Result Date: 01/20/2019 CLINICAL DATA:  Hemodialysis catheter malfunction EXAM: CHEST  1 VIEW COMPARISON:  09/26/2015 FINDINGS: Post sternotomy changes. Left-sided central venous catheter tip projects over the upper SVC. No focal opacity or pleural effusion. Stable cardiomediastinal silhouette with aortic atherosclerosis. No pneumothorax. IMPRESSION: 1. Left-sided central venous catheter tip over the upper SVC 2. Clear lung fields.  No significant abnormalities are identified Electronically Signed   By: Donavan Foil M.D.   On: 01/20/2019 15:25     Medications:   . clindamycin    . phenylephrine (NEO-SYNEPHRINE) Adult infusion Stopped (01/20/19 0951)   . aspirin  81 mg Oral Daily  . Chlorhexidine Gluconate Cloth  6 each Topical Daily  . cinacalcet  30 mg Oral Q T,Th,Sa-HD  . Difluprednate  1 drop Both Eyes BID  . docusate sodium  100 mg Oral BID  . epoetin (EPOGEN/PROCRIT) injection  4,000 Units Intravenous Once  . fentaNYL       . heparin      . isosorbide mononitrate  30 mg Oral Daily  . latanoprost  1 drop Both Eyes BID  . midazolam      . midodrine  10 mg Oral Once in dialysis  . pravastatin  20 mg Oral Daily   acetaminophen, diphenhydrAMINE, HYDROcodone-acetaminophen, HYDROmorphone (DILAUDID) injection, ondansetron **OR** ondansetron (ZOFRAN) IV, ondansetron (ZOFRAN) IV, polyethylene glycol  Assessment/ Plan:  73 y.o. African [place] female with  HTN, CAD h/o CABG 09/2006, ESRD , was admitted on 01/19/2019 for excision of left HERO graft and placement of left IJ PC  [address]/ [facility] Neph/ 3:15  1. Hyperkalemia 2. Anemia of CKD 3. Complication of dialysis access 4. ESRD  Plan: permcath exchanged earlier today Patient seen during dialysis Tolerating well  Low K bath EPO with HD   LOS: [place] 9/17/20203:33 PM  [place], [place]  Note: This note was prepared with Dragon dictation. Any transcription errors are unintentional

## 2019-01-21 NOTE — Progress Notes (Signed)
   01/21/19 1345  Neurological  Level of Consciousness Alert  Orientation Level Oriented X4  Respiratory  Respiratory Pattern Regular;Unlabored  Bilateral Breath Sounds Rhonchi  Cardiac  Pulse Regular  ECG Monitor Yes  Vascular  Edema Left upper extremity  PT ARRIVED FOR TX VIA BED NO C/OS AND NO DISTRESS NOTED CVC WDL TELEMETRY IN USE

## 2019-01-21 NOTE — Op Note (Signed)
OPERATIVE NOTE    PRE-OPERATIVE DIAGNOSIS: 1. ESRD 2. Non-functional permcath  POST-OPERATIVE DIAGNOSIS: same as above  PROCEDURE: 1. Fluoroscopic guidance for placement of catheter 2. Placement of a 27 cm tip to cuff tunneled hemodialysis catheter via the left internal jugular vein and removal of previous catheter  SURGEON: Leotis Pain, MD  ANESTHESIA:  Local with moderate conscious sedation for 20 minutes using 3 mg of Versed and 100 mcg of Fentanyl  ESTIMATED BLOOD LOSS: 5 cc  FINDING(S): none  SPECIMEN(S):  None  INDICATIONS:   Patient is a 73 y.o.female who presents with non-functional dialysis catheter and ESRD.  The patient needs long term dialysis access for their ESRD, and a Permcath is necessary.  Risks and benefits are discussed and informed consent is obtained.    DESCRIPTION: After obtaining full informed written consent, the patient was brought back to the vascular suite. The patient received moderate conscious sedation during a face-to-face encounter with me present throughout the entire procedure and supervising the RN monitoring the vital signs, pulse oximetry, telemetry, and mental status throughout the entire procedure. The patient's existing catheter, left neck and chest were sterilely prepped and draped in a sterile surgical field was created.  The existing catheter was dissected free from the fibrous sheath securing the cuff with hemostats and blunt dissection.  A wire was placed. The existing catheter was then removed and the wire used to keep venous access.  I placed a sheath over the wire and then selected a PermCath to go through the sheath.  I selected a 27 cm tip to cuff tunneled dialysis catheter.  Using fluoroscopic guidance the catheter tips were parked in the right atrium.  The peel-away sheath was removed.  The appropriate distal connectors were placed. It withdrew blood well and flushed easily with heparinized saline and a concentrated heparin solution was  then placed. It was secured to the chest wall with 2 Prolene sutures. A 4-0 Monocryl pursestring suture was placed around the exit site. Sterile dressings were placed. The patient tolerated the procedure well and was taken to the recovery room in stable condition.  COMPLICATIONS: None  CONDITION: Stable  Leotis Pain 01/21/2019 10:46 AM   This note was created with Dragon Medical transcription system. Any errors in dictation are purely unintentional.

## 2019-01-21 NOTE — Progress Notes (Signed)
This note also relates to the following rows which could not be included: Pulse Rate - Cannot attach notes to unvalidated device data Resp - Cannot attach notes to unvalidated device data SpO2 - Cannot attach notes to unvalidated device data    01/21/19 1738  Hand-Off documentation  Report given to (Full Name) Sharyn Lull RN  Vital Signs  BP 104/68  BP Location Right Arm  BP Method Automatic  Patient Position (if appropriate) Lying  Pt stable tolerated tx well via cvc functioning well UFG 800 due to bp drop during tx recovered well no distress, transported back to room no issues

## 2019-01-21 NOTE — H&P (Signed)
[facility] History & Physical Update  The patient was interviewed and re-examined.  The patient's previous History and Physical has been reviewed and is unchanged.  There is no change in the plan of care. We plan to proceed with the scheduled procedure.  Leotis Pain, MD  01/21/2019, 9:28 AM

## 2019-01-21 NOTE — Plan of Care (Signed)
Pt to DC home following HD per VS.  DC packet complete and instruction will be provided.  F/U appt scheduled

## 2019-01-21 NOTE — Progress Notes (Signed)
Established hemodialysis patient known at [place] 6:40. Please contact me directly with any dialysis placement concerns.  Elvera Bicker Dialysis Coordinator [phone]

## 2019-01-22 LAB — TYPE AND SCREEN
ABO/RH(D): A POS
Antibody Screen: POSITIVE
Unit division: 0
Unit division: 0

## 2019-01-22 LAB — BPAM RBC
Blood Product Expiration Date: 202010152359
Blood Product Expiration Date: 202010152359
Unit Type and Rh: 6200
Unit Type and Rh: 6200

## 2019-01-24 LAB — CULTURE, BLOOD (ROUTINE X 2)
Culture: NO GROWTH
Culture: NO GROWTH
Special Requests: ADEQUATE
Special Requests: ADEQUATE

## 2019-01-25 ENCOUNTER — Other Ambulatory Visit: Payer: Self-pay

## 2019-01-25 ENCOUNTER — Encounter (INDEPENDENT_AMBULATORY_CARE_PROVIDER_SITE_OTHER): Payer: Self-pay | Admitting: Nurse Practitioner

## 2019-01-25 ENCOUNTER — Ambulatory Visit (INDEPENDENT_AMBULATORY_CARE_PROVIDER_SITE_OTHER): Payer: Medicare Other | Admitting: Nurse Practitioner

## 2019-01-25 VITALS — BP 123/66 | HR 91 | Resp 16 | Wt 182.4 lb

## 2019-01-25 DIAGNOSIS — M1990 Unspecified osteoarthritis, unspecified site: Secondary | ICD-10-CM

## 2019-01-25 DIAGNOSIS — T827XXA Infection and inflammatory reaction due to other cardiac and vascular devices, implants and grafts, initial encounter: Secondary | ICD-10-CM

## 2019-01-25 DIAGNOSIS — E782 Mixed hyperlipidemia: Secondary | ICD-10-CM

## 2019-01-28 ENCOUNTER — Ambulatory Visit (INDEPENDENT_AMBULATORY_CARE_PROVIDER_SITE_OTHER): Payer: Medicare Other | Admitting: Nurse Practitioner

## 2019-01-31 ENCOUNTER — Encounter (INDEPENDENT_AMBULATORY_CARE_PROVIDER_SITE_OTHER): Payer: Self-pay | Admitting: Nurse Practitioner

## 2019-01-31 NOTE — Progress Notes (Signed)
SUBJECTIVE:  Patient ID: Kelli Nguyen, female    DOB: 03-12-1946, 73 y.o.   MRN: [id number] Chief Complaint  Patient presents with   Follow-up    [facility] 1week incision check    HPI  Kelli Nguyen is a 73 y.o. female that presents today for wound evaluation after excision of her left hero graft due to infection.  The patient states that the wound feels good she denies any oozing and extensive swelling.  She denies any fever, chills, nausea, vomiting or diarrhea.  She denies any chest pain shortness of breath.  She denies any TIA-like symptoms.  Currently she is maintained via a left PermCath and denies any issues with it currently.  The wound appears well approximated with no areas of dehiscence.  Past Medical History:  Diagnosis Date   Anemia    Chronic cough    Chronic kidney disease    Coronary artery disease    Diabetes mellitus without complication ([place])    Dialysis patient ([place])    T-TH-SAT   GERD (gastroesophageal reflux disease)    Hypertension    Neuropathy    hands   Wears dentures    partial upper and lower    Past Surgical History:  Procedure Laterality Date   A/V SHUNT INTERVENTION N/A 10/16/2016   Procedure: A/V Shunt Intervention;  Surgeon: Katha Cabal, MD;  Location: [place] [facility];  Service: Cardiovascular;  Laterality: N/A;   A/V SHUNTOGRAM Left 10/16/2016   Procedure: A/V Shuntogram;  Surgeon: Katha Cabal, MD;  Location: [place] [facility];  Service: Cardiovascular;  Laterality: Left;   ABDOMINAL HYSTERECTOMY     CARPAL TUNNEL RELEASE Left 09/27/2015   Procedure: CARPAL TUNNEL RELEASE;  Surgeon: Leanor Kail, MD;  Location: [facility];  Service: Orthopedics;  Laterality: Left;   CATARACT EXTRACTION W/PHACO Right 09/23/2016   Procedure: CATARACT EXTRACTION PHACO AND INTRAOCULAR LENS PLACEMENT (IOC) complicated diabetic right;  Surgeon: Ronnell Freshwater, MD;  Location: [place];  Service: Ophthalmology;   Laterality: Right;  Diabetic - diet controlled Potassium draw before surgery   CATARACT EXTRACTION W/PHACO Left 01/20/2017   Procedure: CATARACT EXTRACTION PHACO AND INTRAOCULAR LENS PLACEMENT ([place]) LEFT DIABETIC;  Surgeon: Eulogio Bear, MD;  Location: [place];  Service: Ophthalmology;  Laterality: Left;  Diabetic - diet controlled Potassium draw prior to procedure  8:00 ARRIVAL   COLONOSCOPY     COLONOSCOPY WITH PROPOFOL N/A 11/28/2014   Procedure: COLONOSCOPY WITH PROPOFOL;  Surgeon: Manya Silvas, MD;  Location[facility];  Service: Endoscopy;  Laterality: N/A;   CORONARY ARTERY BYPASS GRAFT     DIALYSIS/PERMA CATHETER INSERTION Left 01/21/2019   Procedure: DIALYSIS/PERMA CATHETER EXCHANGE;  Surgeon: Algernon Huxley, MD;  Location: [place] [facility];  Service: Cardiovascular;  Laterality: Left;   INSERTION OF DIALYSIS CATHETER Left 01/19/2019   Procedure: INSERTION OF DIALYSIS CATHETER;  Surgeon: Katha Cabal, MD;  Location: [facility];  Service: Vascular;  Laterality: Left;   LIGATIONS OF HERO GRAFT Left 01/19/2019   Procedure: EXCISION LEFT HERO GRAFT;  Surgeon: Katha Cabal, MD;  Location: [facility];  Service: Vascular;  Laterality: Left;   PERIPHERAL VASCULAR CATHETERIZATION Left 01/18/2015   Procedure: A/V Shuntogram/Fistulagram;  Surgeon: Katha Cabal, MD;  Location: [place] [facility];  Service: Cardiovascular;  Laterality: Left;   PERIPHERAL VASCULAR CATHETERIZATION Left 01/18/2015   Procedure: A/V Shunt Intervention;  Surgeon: Katha Cabal, MD;  Location: [place] [facility];  Service: Cardiovascular;  Laterality: Left;   PERIPHERAL VASCULAR CATHETERIZATION N/A 08/10/2015   Procedure: Thrombectomy;  Surgeon: Algernon Huxley, MD;  Location: [place] [facility];  Service: Cardiovascular;  Laterality: N/A;   PERIPHERAL VASCULAR CATHETERIZATION N/A 08/10/2015   Procedure: A/V Shuntogram/Fistulagram;  Surgeon: Algernon Huxley, MD;  Location: [place] [facility];  Service: Cardiovascular;  Laterality: N/A;   PERIPHERAL VASCULAR CATHETERIZATION N/A 08/10/2015   Procedure: A/V Shunt Intervention;  Surgeon: Algernon Huxley, MD;  Location: [place] [facility];  Service: Cardiovascular;  Laterality: N/A;   PERIPHERAL VASCULAR THROMBECTOMY Left 01/12/2019   Procedure: PERIPHERAL VASCULAR THROMBECTOMY;  Surgeon: Katha Cabal, MD;  Location: [place] [facility];  Service: Cardiovascular;  Laterality: Left;   REMOVAL OF A DIALYSIS CATHETER Right 01/19/2019   Procedure: REMOVAL OF A DIALYSIS CATHETER;  Surgeon: Katha Cabal, MD;  Location: [facility];  Service: Vascular;  Laterality: Right;   RENAL SHUNTS      Social History   Socioeconomic History   Marital status: Widowed    Spouse name: Not on file   Number of children: Not on file   Years of education: Not on file   Highest education level: Not on file  Occupational History   Not on file  Social Needs   Financial resource strain: Not on file   Food insecurity    Worry: Not on file    Inability: Not on file   Transportation needs    Medical: Not on file    Non-medical: Not on file  Tobacco Use   Smoking status: Never Smoker   Smokeless tobacco: Never Used  Substance and Sexual Activity   Alcohol use: No   Drug use: No   Sexual activity: Not on file  Lifestyle   Physical activity    Days per week: Not on file    Minutes per session: Not on file   Stress: Not on file  Relationships   Social connections    Talks on phone: Not on file    Gets together: Not on file    Attends religious service: Not on file    Active member of club or organization: Not on file    Attends meetings of clubs or organizations: Not on file    Relationship status: Not on file   Intimate partner violence    Fear of current or ex partner: Not on file    Emotionally abused: Not on file    Physically abused: Not on file    Forced sexual activity: Not on file  Other Topics  Concern   Not on file  Social History Narrative   Not on file    History reviewed. No pertinent family history.  Allergies  Allergen Reactions   Codeine Sulfate Nausea Only   Contrast Media [Iodinated Diagnostic Agents] Hives   Gadobutrol Hives   Gadolinium Derivatives Hives   Penicillins Hives and Itching    Has patient had a PCN reaction causing immediate rash, facial/tongue/throat swelling, SOB or lightheadedness with hypotension: No Has patient had a PCN reaction causing severe rash involving mucus membranes or skin necrosis: No Has patient had a PCN reaction that required hospitalization: No Has patient had a PCN reaction occurring within the last 10 years: No If all of the above answers are "NO", then may proceed with Cephalosporin use.    Statins Other (See Comments)    muscle pain     Review of Systems   Review of Systems: Negative  Unless Checked Constitutional: [] Weight loss  [] Fever  [] Chills Cardiac: [] Chest pain   []  Atrial Fibrillation  [] Palpitations   [] Shortness of breath when laying flat   [] Shortness of breath with exertion. [] Shortness of breath at rest Vascular:  [] Pain in legs with walking   [] Pain in legs with standing [] Pain in legs when laying flat   [] Claudication    [] Pain in feet when laying flat    [] History of DVT   [] Phlebitis   [] Swelling in legs   [] Varicose veins   [] Non-healing ulcers Pulmonary:   [] Uses home oxygen   [] Productive cough   [] Hemoptysis   [] Wheeze  [] COPD   [] Asthma Neurologic:  [] Dizziness   [] Seizures  [] Blackouts [] History of stroke   [] History of TIA  [] Aphasia   [] Temporary Blindness   [] Weakness or numbness in arm   [] Weakness or numbness in leg Musculoskeletal:   [] Joint swelling   [] Joint pain   [] Low back pain  []  History of Knee Replacement [x] Arthritis [] back Surgeries  []  Spinal Stenosis    Hematologic:  [] Easy bruising  [] Easy bleeding   [] Hypercoagulable state   [] Anemic Gastrointestinal:  [] Diarrhea    [] Vomiting  [x] Gastroesophageal reflux/heartburn   [] Difficulty swallowing. [] Abdominal pain Genitourinary:  [] Chronic kidney disease   [] Difficult urination  [] Anuric   [] Blood in urine [] Frequent urination  [] Burning with urination   [] Hematuria Skin:  [] Rashes   [] Ulcers [x] Wounds Psychological:  [] History of anxiety   []  History of major depression  []  Memory Difficulties      OBJECTIVE:   Physical Exam  BP 123/66 (BP Location: Right Arm)    Pulse 91    Resp 16    Wt 182 lb 6.4 oz (82.7 kg)    BMI 36.84 kg/m   Gen: WD/WN, NAD Head: [place]/AT, No temporalis wasting.  Ear/Nose/Throat: Hearing grossly intact, nares w/o erythema or drainage Eyes: PER, EOMI, sclera nonicteric.  Neck: Supple, no masses.  No JVD.  Pulmonary:  Good air movement, no use of accessory muscles.  Cardiac: RRR Vascular: staples in left arm after graft removal, clean dry and intact little swelling no bruising Vessel Right Left  Radial Palpable Palpable   Gastrointestinal: soft, non-distended. No guarding/no peritoneal signs.  Musculoskeletal: M/S 5/5 throughout.  No deformity or atrophy.  Neurologic: Pain and light touch intact in extremities.  Symmetrical.  Speech is fluent. Motor exam as listed above. Psychiatric: Judgment intact, Mood & affect appropriate for pt's clinical situation. Dermatologic: No Venous rashes. No Ulcers Noted.  No changes consistent with cellulitis. Lymph : No Cervical lymphadenopathy, no lichenification or skin changes of chronic lymphedema.       ASSESSMENT AND PLAN:  1. Infection of prosthetic vascular graft, initial encounter [facility]) The patient currently has a PermCath in her left chest which would give [place] the option of replacing the graft in her left arm.  However this would need at least 6 to 8 weeks maybe even longer to heal before we could place another graft.  The alternative option is to do a right upper extremity venogram to see what available options are on her right upper  extremity.  It is known that the patient has had previous graft to that arm however we are unsure if we are able to do any other sort of access.  We will discuss patient's options when she returns for staple removal.  2. Arthritis Continue NSAID medications as already ordered, these medications have been reviewed and there are no changes at this time.  Continued activity and therapy was stressed.   3. Mixed hyperlipidemia Continue statin as ordered and reviewed, no changes at this time    Current Outpatient Medications on File Prior to Visit  Medication Sig Dispense Refill   acetaminophen (TYLENOL) 500 MG tablet Tylenol Extra Strength 500 mg tablet  Take 2 tablets every 8 hours by oral route as needed.     aspirin 81 MG tablet Take 81 mg by mouth daily.     cinacalcet (SENSIPAR) 30 MG tablet Take 30 mg by mouth 3 (three) times a week. On dialysis days - Tues, Thurs, Sat     DiphenhydrAMINE HCl, Sleep, 50 MG tablet Take 1 tablet by mouth 30 minutes before CT scan     DUREZOL 0.05 % EMUL Place 1 drop into both eyes 2 (two) times daily.  0   isosorbide mononitrate (IMDUR) 30 MG 24 hr tablet Take 30 mg by mouth daily.     latanoprost (XALATAN) 0.005 % ophthalmic solution Place 1 drop into both eyes 2 (two) times daily.      oxyCODONE-acetaminophen (PERCOCET) 5-325 MG tablet Take 1-2 tablets by mouth every 6 (six) hours as needed for moderate pain or severe pain. 50 tablet 0   POLYETHYLENE GLYCOL 3350 PO as needed.      pravastatin (PRAVACHOL) 20 MG tablet Take 20 mg by mouth daily.     ranitidine (ZANTAC) 150 MG tablet Take 150 mg by mouth 2 (two) times daily.     No current facility-administered medications on file prior to visit.     There are no Patient Instructions on file for this visit. No follow-ups on file.   Kris Hartmann, NP  This note was completed with Sales executive.  Any errors are purely unintentional.

## 2019-02-04 DIAGNOSIS — A499 Bacterial infection, unspecified: Secondary | ICD-10-CM | POA: Insufficient documentation

## 2019-02-05 LAB — SURGICAL PATHOLOGY

## 2019-02-08 ENCOUNTER — Ambulatory Visit (INDEPENDENT_AMBULATORY_CARE_PROVIDER_SITE_OTHER): Payer: Medicare Other | Admitting: Vascular Surgery

## 2019-02-08 ENCOUNTER — Encounter (INDEPENDENT_AMBULATORY_CARE_PROVIDER_SITE_OTHER): Payer: Self-pay | Admitting: Vascular Surgery

## 2019-02-08 ENCOUNTER — Other Ambulatory Visit: Payer: Self-pay

## 2019-02-08 VITALS — BP 148/72 | HR 80 | Resp 10 | Ht 59.0 in | Wt 177.0 lb

## 2019-02-08 DIAGNOSIS — T829XXS Unspecified complication of cardiac and vascular prosthetic device, implant and graft, sequela: Secondary | ICD-10-CM

## 2019-02-08 DIAGNOSIS — N186 End stage renal disease: Secondary | ICD-10-CM

## 2019-02-08 DIAGNOSIS — Z992 Dependence on renal dialysis: Secondary | ICD-10-CM

## 2019-02-08 NOTE — Progress Notes (Signed)
Patient ID: Kelli Nguyen, female   DOB: 1946-01-27, 73 y.o.   MRN: [id number]  Chief Complaint  Patient presents with   Follow-up    HPI Kelli Nguyen is a 73 y.o. female.    Patient returns the office for follow-up care status post excision of an infected left arm hero graft on January 19, 2019.  She did require exchange of her left IJ tunneled catheter 2 days later secondary to malfunction of the catheter.  A 27 tip to cuff catheter was placed at that time.  She reports her catheter is been working well.  She also reports that they have drawn cultures although she denies fever chills.  She has no complaints regarding her left arm wound.  She denies drainage she denies bleeding.  Past Medical History:  Diagnosis Date   Anemia    Chronic cough    Chronic kidney disease    Coronary artery disease    Diabetes mellitus without complication ([place])    Dialysis patient ([place])    T-TH-SAT   GERD (gastroesophageal reflux disease)    Hypertension    Neuropathy    hands   Wears dentures    partial upper and lower    Past Surgical History:  Procedure Laterality Date   A/V SHUNT INTERVENTION N/A 10/16/2016   Procedure: A/V Shunt Intervention;  Surgeon: Katha Cabal, MD;  Location: [place] [facility];  Service: Cardiovascular;  Laterality: N/A;   A/V SHUNTOGRAM Left 10/16/2016   Procedure: A/V Shuntogram;  Surgeon: Katha Cabal, MD;  Location: [place] [facility];  Service: Cardiovascular;  Laterality: Left;   ABDOMINAL HYSTERECTOMY     CARPAL TUNNEL RELEASE Left 09/27/2015   Procedure: CARPAL TUNNEL RELEASE;  Surgeon: Leanor Kail, MD;  Location: [facility];  Service: Orthopedics;  Laterality: Left;   CATARACT EXTRACTION W/PHACO Right 09/23/2016   Procedure: CATARACT EXTRACTION PHACO AND INTRAOCULAR LENS PLACEMENT (IOC) complicated diabetic right;  Surgeon: Ronnell Freshwater, MD;  Location: [place];  Service: Ophthalmology;   Laterality: Right;  Diabetic - diet controlled Potassium draw before surgery   CATARACT EXTRACTION W/PHACO Left 01/20/2017   Procedure: CATARACT EXTRACTION PHACO AND INTRAOCULAR LENS PLACEMENT ([place]) LEFT DIABETIC;  Surgeon: Eulogio Bear, MD;  Location: [place];  Service: Ophthalmology;  Laterality: Left;  Diabetic - diet controlled Potassium draw prior to procedure  8:00 ARRIVAL   COLONOSCOPY     COLONOSCOPY WITH PROPOFOL N/A 11/28/2014   Procedure: COLONOSCOPY WITH PROPOFOL;  Surgeon: Manya Silvas, MD;  Location: [facility]COPY;  Service: Endoscopy;  Laterality: N/A;   CORONARY ARTERY BYPASS GRAFT     DIALYSIS/PERMA CATHETER INSERTION Left 01/21/2019   Procedure: DIALYSIS/PERMA CATHETER EXCHANGE;  Surgeon: Algernon Huxley, MD;  Location: [place] [facility];  Service: Cardiovascular;  Laterality: Left;   INSERTION OF DIALYSIS CATHETER Left 01/19/2019   Procedure: INSERTION OF DIALYSIS CATHETER;  Surgeon: Katha Cabal, MD;  Location: [facility];  Service: Vascular;  Laterality: Left;   LIGATIONS OF HERO GRAFT Left 01/19/2019   Procedure: EXCISION LEFT HERO GRAFT;  Surgeon: Katha Cabal, MD;  Location: [facility];  Service: Vascular;  Laterality: Left;   PERIPHERAL VASCULAR CATHETERIZATION Left 01/18/2015   Procedure: A/V Shuntogram/Fistulagram;  Surgeon: Katha Cabal, MD;  Location: [place] [facility];  Service: Cardiovascular;  Laterality: Left;   PERIPHERAL VASCULAR CATHETERIZATION Left 01/18/2015   Procedure: A/V Shunt Intervention;  Surgeon: Katha Cabal, MD;  Location: [place]  [facility];  Service: Cardiovascular;  Laterality: Left;   PERIPHERAL VASCULAR CATHETERIZATION N/A 08/10/2015   Procedure: Thrombectomy;  Surgeon: Algernon Huxley, MD;  Location: [place] [facility];  Service: Cardiovascular;  Laterality: N/A;   PERIPHERAL VASCULAR CATHETERIZATION N/A 08/10/2015   Procedure: A/V Shuntogram/Fistulagram;  Surgeon: Algernon Huxley, MD;  Location: [place] [facility];  Service: Cardiovascular;  Laterality: N/A;   PERIPHERAL VASCULAR CATHETERIZATION N/A 08/10/2015   Procedure: A/V Shunt Intervention;  Surgeon: Algernon Huxley, MD;  Location: [place] [facility];  Service: Cardiovascular;  Laterality: N/A;   PERIPHERAL VASCULAR THROMBECTOMY Left 01/12/2019   Procedure: PERIPHERAL VASCULAR THROMBECTOMY;  Surgeon: Katha Cabal, MD;  Location: [place] [facility];  Service: Cardiovascular;  Laterality: Left;   REMOVAL OF A DIALYSIS CATHETER Right 01/19/2019   Procedure: REMOVAL OF A DIALYSIS CATHETER;  Surgeon: Katha Cabal, MD;  Location: [facility];  Service: Vascular;  Laterality: Right;   RENAL SHUNTS        Allergies  Allergen Reactions   Codeine Sulfate Nausea Only   Contrast Media [Iodinated Diagnostic Agents] Hives   Gadobutrol Hives   Gadolinium Derivatives Hives   Penicillins Hives and Itching    Has patient had a PCN reaction causing immediate rash, facial/tongue/throat swelling, SOB or lightheadedness with hypotension: No Has patient had a PCN reaction causing severe rash involving mucus membranes or skin necrosis: No Has patient had a PCN reaction that required hospitalization: No Has patient had a PCN reaction occurring within the last 10 years: No If all of the above answers are "NO", then may proceed with Cephalosporin use.    Statins Other (See Comments)    muscle pain    Current Outpatient Medications  Medication Sig Dispense Refill   acetaminophen (TYLENOL) 500 MG tablet Tylenol Extra Strength 500 mg tablet  Take 2 tablets every 8 hours by oral route as needed.     aspirin 81 MG tablet Take 81 mg by mouth daily.     cinacalcet (SENSIPAR) 30 MG tablet Take 30 mg by mouth 3 (three) times a week. On dialysis days - Tues, Thurs, Sat     DiphenhydrAMINE HCl, Sleep, 50 MG tablet Take 1 tablet by mouth 30 minutes before CT scan     DUREZOL 0.05 % EMUL Place 1 drop into both eyes 2 (two) times daily.  0     isosorbide mononitrate (IMDUR) 30 MG 24 hr tablet Take 30 mg by mouth daily.     latanoprost (XALATAN) 0.005 % ophthalmic solution Place 1 drop into both eyes 2 (two) times daily.      oxyCODONE-acetaminophen (PERCOCET) 5-325 MG tablet Take 1-2 tablets by mouth every 6 (six) hours as needed for moderate pain or severe pain. 50 tablet 0   POLYETHYLENE GLYCOL 3350 PO as needed.      pravastatin (PRAVACHOL) 20 MG tablet Take 20 mg by mouth daily.     ranitidine (ZANTAC) 150 MG tablet Take 150 mg by mouth 2 (two) times daily.     No current facility-administered medications for this visit.         Physical Exam BP (!) 148/72 (BP Location: Right Arm, Patient Position: Sitting, Cuff Size: Normal)    Pulse 80    Resp 10    Ht 4\' 11"  (1.499 m)    Wt 177 lb (80.3 kg)    BMI 35.75 kg/m  Gen:  WD/WN, NAD Skin: Left arm incision C/D/I; staples are removed without incident.  Incision appeal well-healed.  Left IJ catheter clean dry and intact.     Assessment/Plan: 1. Complication of vascular access for dialysis, sequela I will follow-up with the patient in 1 month.  At that time we will plan for replacement of what would likely be a hero graft.  Potentially bilateral upper extremity venograms will be obtained to assess both right and left arms for planning of extremity access.  I will contact the dialysis center on [place] to find out the results of this culture that was drawn.  2. End stage renal failure on dialysis ([place]) At the present time the patient has adequate dialysis access.  Continue hemodialysis as ordered without interruption.  Avoid nephrotoxic medications and dehydration.  Further plans per nephrology      Hortencia Pilar 02/08/2019, 10:14 AM   This note was created with Dragon medical transcription system.  Any errors from dictation are unintentional.

## 2019-03-15 ENCOUNTER — Other Ambulatory Visit: Payer: Self-pay

## 2019-03-15 ENCOUNTER — Ambulatory Visit (INDEPENDENT_AMBULATORY_CARE_PROVIDER_SITE_OTHER): Payer: Medicare Other | Admitting: Vascular Surgery

## 2019-03-15 ENCOUNTER — Encounter (INDEPENDENT_AMBULATORY_CARE_PROVIDER_SITE_OTHER): Payer: Self-pay | Admitting: Vascular Surgery

## 2019-03-15 VITALS — BP 121/63 | HR 72 | Resp 20 | Ht 59.0 in | Wt 175.0 lb

## 2019-03-15 DIAGNOSIS — E782 Mixed hyperlipidemia: Secondary | ICD-10-CM

## 2019-03-15 DIAGNOSIS — T829XXS Unspecified complication of cardiac and vascular prosthetic device, implant and graft, sequela: Secondary | ICD-10-CM

## 2019-03-15 DIAGNOSIS — N186 End stage renal disease: Secondary | ICD-10-CM

## 2019-03-15 DIAGNOSIS — I871 Compression of vein: Secondary | ICD-10-CM

## 2019-03-15 DIAGNOSIS — I25119 Atherosclerotic heart disease of native coronary artery with unspecified angina pectoris: Secondary | ICD-10-CM

## 2019-03-15 NOTE — Progress Notes (Signed)
MRN : [id number]  Kelli Nguyen is a 73 y.o. (06/24/1945) female who presents with chief complaint of  Chief Complaint  Patient presents with  . Follow-up  .  History of Present Illness:   Patient returns the office for follow-up care status post excision of an infected left arm hero graft on January 19, 2019.  She did require exchange of her left IJ tunneled catheter 2 days later secondary to malfunction of the catheter.  A 27 tip to cuff catheter was placed at that time.  She reports her catheter is been working well.    She denies fever chills.  She has no complaints regarding her left arm which is now healed.  She denies drainage she denies bleeding.  Current Meds  Medication Sig  . acetaminophen (TYLENOL) 500 MG tablet Tylenol Extra Strength 500 mg tablet  Take 2 tablets every 8 hours by oral route as needed.  [name] aspirin 81 MG tablet Take 81 mg by mouth daily.  . cinacalcet (SENSIPAR) 30 MG tablet Take 30 mg by mouth 3 (three) times a week. On dialysis days - Tues, Thurs, Sat  . DUREZOL 0.05 % EMUL Place 1 drop into both eyes 2 (two) times daily.  [name] latanoprost (XALATAN) 0.005 % ophthalmic solution Place 1 drop into both eyes 2 (two) times daily.   . pravastatin (PRAVACHOL) 20 MG tablet Take 20 mg by mouth daily.  . ranitidine (ZANTAC) 150 MG tablet Take 150 mg by mouth 2 (two) times daily.    Past Medical History:  Diagnosis Date  . Anemia   . Chronic cough   . Chronic kidney disease   . Coronary artery disease   . Diabetes mellitus without complication ([place])   . Dialysis patient ([place])    T-TH-SAT  . GERD (gastroesophageal reflux disease)   . Hypertension   . Neuropathy    hands  . Wears dentures    partial upper and lower    Past Surgical History:  Procedure Laterality Date  . A/V SHUNT INTERVENTION N/A 10/16/2016   Procedure: A/V Shunt Intervention;  Surgeon: Katha Cabal, MD;  Location: [facility];  Service: Cardiovascular;  Laterality:  N/A;  . A/V SHUNTOGRAM Left 10/16/2016   Procedure: A/V Shuntogram;  Surgeon: Katha Cabal, MD;  Location: [facility];  Service: Cardiovascular;  Laterality: Left;  . ABDOMINAL HYSTERECTOMY    . CARPAL TUNNEL RELEASE Left 09/27/2015   Procedure: CARPAL TUNNEL RELEASE;  Surgeon: Leanor Kail, MD;  Location: [facility];  Service: Orthopedics;  Laterality: Left;  . CATARACT EXTRACTION W/PHACO Right 09/23/2016   Procedure: CATARACT EXTRACTION PHACO AND INTRAOCULAR LENS PLACEMENT (IOC) complicated diabetic right;  Surgeon: Ronnell Freshwater, MD;  Location: [facility];  Service: Ophthalmology;  Laterality: Right;  Diabetic - diet controlled Potassium draw before surgery  . CATARACT EXTRACTION W/PHACO Left 01/20/2017   Procedure: CATARACT EXTRACTION PHACO AND INTRAOCULAR LENS PLACEMENT (IOC) LEFT DIABETIC;  Surgeon: Eulogio Bear, MD;  Location: [facility];  Service: Ophthalmology;  Laterality: Left;  Diabetic - diet controlled Potassium draw prior to procedure  8:00 ARRIVAL  . COLONOSCOPY    . COLONOSCOPY WITH PROPOFOL N/A 11/28/2014   Procedure: COLONOSCOPY WITH PROPOFOL;  Surgeon: Manya Silvas, MD;  [facility];  Service: Endoscopy;  Laterality: N/A;  . CORONARY ARTERY BYPASS GRAFT    . DIALYSIS/PERMA CATHETER INSERTION Left 01/21/2019   Procedure: DIALYSIS/PERMA CATHETER EXCHANGE;  Surgeon: Algernon Huxley, MD;  Location: [facility];  Service: Cardiovascular;  Laterality: Left;  . INSERTION OF DIALYSIS CATHETER Left 01/19/2019   Procedure: INSERTION OF DIALYSIS CATHETER;  Surgeon: Katha Cabal, MD;  Location: [facility];  Service: Vascular;  Laterality: Left;  . LIGATIONS OF HERO GRAFT Left 01/19/2019   Procedure: EXCISION LEFT HERO GRAFT;  Surgeon: Katha Cabal, MD;  Location: [facility];  Service: Vascular;  Laterality: Left;  . PERIPHERAL VASCULAR CATHETERIZATION Left 01/18/2015   Procedure: A/V Shuntogram/Fistulagram;   Surgeon: Katha Cabal, MD;  Location: [facility];  Service: Cardiovascular;  Laterality: Left;  . PERIPHERAL VASCULAR CATHETERIZATION Left 01/18/2015   Procedure: A/V Shunt Intervention;  Surgeon: Katha Cabal, MD;  Location: [facility];  Service: Cardiovascular;  Laterality: Left;  . PERIPHERAL VASCULAR CATHETERIZATION N/A 08/10/2015   Procedure: Thrombectomy;  Surgeon: Algernon Huxley, MD;  Location: [facility];  Service: Cardiovascular;  Laterality: N/A;  . PERIPHERAL VASCULAR CATHETERIZATION N/A 08/10/2015   Procedure: A/V Shuntogram/Fistulagram;  Surgeon: Algernon Huxley, MD;  Location: [facility];  Service: Cardiovascular;  Laterality: N/A;  . PERIPHERAL VASCULAR CATHETERIZATION N/A 08/10/2015   Procedure: A/V Shunt Intervention;  Surgeon: Algernon Huxley, MD;  Location: [facility];  Service: Cardiovascular;  Laterality: N/A;  . PERIPHERAL VASCULAR THROMBECTOMY Left 01/12/2019   Procedure: PERIPHERAL VASCULAR THROMBECTOMY;  Surgeon: Katha Cabal, MD;  Location: [facility];  Service: Cardiovascular;  Laterality: Left;  . REMOVAL OF A DIALYSIS CATHETER Right 01/19/2019   Procedure: REMOVAL OF A DIALYSIS CATHETER;  Surgeon: Katha Cabal, MD;  Location: [facility];  Service: Vascular;  Laterality: Right;  . RENAL SHUNTS      Social History Social History   Tobacco Use  . Smoking status: Never Smoker  . Smokeless tobacco: Never Used  Substance Use Topics  . Alcohol use: No  . Drug use: No    Family History No family history on file.  Allergies  Allergen Reactions  . Codeine Sulfate Nausea Only  . Contrast Media [Iodinated Diagnostic Agents] Hives  . Gadobutrol Hives  . Gadolinium Derivatives Hives  . Penicillins Hives and Itching    Has patient had a PCN reaction causing immediate rash, facial/tongue/throat swelling, SOB or lightheadedness with hypotension: No Has patient had a PCN reaction causing severe rash involving  mucus membranes or skin necrosis: No Has patient had a PCN reaction that required hospitalization: No Has patient had a PCN reaction occurring within the last 10 years: No If all of the above answers are "NO", then may proceed with Cephalosporin use.   . Statins Other (See Comments)    muscle pain     REVIEW OF SYSTEMS (Negative unless checked)  Constitutional: [] Weight loss  [] Fever  [] Chills Cardiac: [] Chest pain   [] Chest pressure   [] Palpitations   [] Shortness of breath when laying flat   [] Shortness of breath with exertion. Vascular:  [] Pain in legs with walking   [] Pain in legs at rest  [] History of DVT   [] Phlebitis   [] Swelling in legs   [] Varicose veins   [] Non-healing ulcers Pulmonary:   [] Uses home oxygen   [] Productive cough   [] Hemoptysis   [] Wheeze  [] COPD   [] Asthma Neurologic:  [] Dizziness   [] Seizures   [] History of stroke   [] History of TIA  [] Aphasia   [] Vissual changes   [] Weakness or numbness in arm   [] Weakness or numbness in leg Musculoskeletal:   [] Joint  swelling   [] Joint pain   [] Low back pain Hematologic:  [] Easy bruising  [] Easy bleeding   [] Hypercoagulable state   [] Anemic Gastrointestinal:  [] Diarrhea   [] Vomiting  [x] Gastroesophageal reflux/heartburn   [] Difficulty swallowing. Genitourinary:  [x] Chronic kidney disease   [] Difficult urination  [] Frequent urination   [] Blood in urine Skin:  [] Rashes   [] Ulcers  Psychological:  [] History of anxiety   []  History of major depression.  Physical Examination  Vitals:   03/15/19 0929  BP: 121/63  Pulse: 72  Resp: 20  Weight: 175 lb (79.4 kg)  Height: 4\' 11"  (1.499 m)   Body mass index is 35.35 kg/m. Gen: WD/WN, NAD Head: [place]/AT, No temporalis wasting.  Ear/Nose/Throat: Hearing grossly intact, nares w/o erythema or drainage Eyes: PER, EOMI, sclera nonicteric.  Neck: Supple, no large masses.   Pulmonary:  Good air movement, no audible wheezing bilaterally, no use of accessory muscles.  Cardiac: RRR, no JVD  Vascular:  Left arm is healed but scar tissue is still dense Vessel Right Left  Radial Palpable Palpable  Brachial Palpable Palpable  Gastrointestinal: Non-distended. No guarding/no peritoneal signs.  Musculoskeletal: M/S 5/5 throughout.  No deformity or atrophy.  Neurologic: CN 2-12 intact. Symmetrical.  Speech is fluent. Motor exam as listed above. Psychiatric: Judgment intact, Mood & affect appropriate for pt's clinical situation. Dermatologic: No rashes or ulcers noted.  No changes consistent with cellulitis. Lymph : No lichenification or skin changes of chronic lymphedema.  CBC Lab Results  Component Value Date   WBC 7.8 01/21/2019   HGB 7.2 (L) 01/21/2019   HCT 23.1 (L) 01/21/2019   MCV 87.2 01/21/2019   PLT 146 (L) 01/21/2019    BMET    Component Value Date/Time   NA 142 01/21/2019 0504   NA 135 (L) 03/29/2013 1409   K 5.2 (H) 01/21/2019 0504   K 4.5 03/29/2013 1409   CL 104 01/21/2019 0504   CL 97 (L) 03/29/2013 1409   CO2 25 01/21/2019 0504   CO2 27 03/29/2013 1409   GLUCOSE 102 (H) 01/21/2019 0504   GLUCOSE 97 03/29/2013 1409   BUN 72 (H) 01/21/2019 0504   BUN 48 (H) 03/29/2013 1409   CREATININE 12.69 (H) 01/21/2019 0504   CREATININE 12.06 (H) 03/29/2013 1409   CALCIUM 7.8 (L) 01/21/2019 0504   CALCIUM 8.8 03/29/2013 1409   GFRNONAA 3 (L) 01/21/2019 0504   GFRNONAA 3 (L) 03/29/2013 1409   GFRAA 3 (L) 01/21/2019 0504   GFRAA 3 (L) 03/29/2013 1409   CrCl cannot be calculated (Patient's most recent lab result is older than the maximum 21 days allowed.).  COAG Lab Results  Component Value Date   INR 1.0 01/19/2019    Radiology No results found.    Assessment/Plan 1. Complication of vascular access for dialysis, sequela Recommend:  At this time the patient does not have appropriate extremity access for dialysis.  Her catheter is working well at this point  Patient should have a left arm HERO graft created in January.  We need to allow the scar  tissue to soften before redoing the graft  The risks, benefits and alternative therapies were reviewed in detail with the patient.  All questions were answered.  The patient agrees to proceed with surgery.    2. ESRD (end stage renal disease) ([place]) At the present time the patient has adequate dialysis access.  Continue hemodialysis as ordered without interruption.  Avoid nephrotoxic medications and dehydration.  Further plans per nephrology  3. Coronary artery  disease involving native coronary artery of native heart with angina pectoris ([place]) Continue cardiac and antihypertensive medications as already ordered and reviewed, no changes at this time.  Continue statin as ordered and reviewed, no changes at this time  Nitrates PRN for chest pain   4. Superior vena cava compression syndrome Will plan for HERO graft in January  5. Mixed hyperlipidemia Continue statin as ordered and reviewed, no changes at this time    Hortencia Pilar, MD  03/15/2019 9:43 AM

## 2019-04-14 NOTE — Progress Notes (Signed)
Patient ID: Kelli Nguyen, female   DOB: 11-15-1945, 73 y.o.   MRN: [id number]  No chief complaint on file.   HPI Kelli Nguyen is a 73 y.o. female.    Patient returns the office for follow-up care status post excision of an infected left arm hero graft on January 19, 2019. She did require exchange of her left IJ tunneled catheter 2 days later secondary to malfunction of the catheter. A 27 tip to cuff catheter was placed at that time. She reports her catheter is been working well.   She denies fever chills.  She has no complaints regarding her left arm which is now healed. She denies drainage she denies bleeding.   Past Medical History:  Diagnosis Date  . Anemia   . Chronic cough   . Chronic kidney disease   . Coronary artery disease   . Diabetes mellitus without complication ([place])   . Dialysis patient ([place])    T-TH-SAT  . GERD (gastroesophageal reflux disease)   . Hypertension   . Neuropathy    hands  . Wears dentures    partial upper and lower    Past Surgical History:  Procedure Laterality Date  . A/V SHUNT INTERVENTION N/A 10/16/2016   Procedure: A/V Shunt Intervention;  Surgeon: Katha Cabal, MD;  Location: [facility];  Service: Cardiovascular;  Laterality: N/A;  . A/V SHUNTOGRAM Left 10/16/2016   Procedure: A/V Shuntogram;  Surgeon: Katha Cabal, MD;  Location: [facility];  Service: Cardiovascular;  Laterality: Left;  . ABDOMINAL HYSTERECTOMY    . CARPAL TUNNEL RELEASE Left 09/27/2015   Procedure: CARPAL TUNNEL RELEASE;  Surgeon: Leanor Kail, MD;  Location: [facility];  Service: Orthopedics;  Laterality: Left;  . CATARACT EXTRACTION W/PHACO Right 09/23/2016   Procedure: CATARACT EXTRACTION PHACO AND INTRAOCULAR LENS PLACEMENT (IOC) complicated diabetic right;  Surgeon: Ronnell Freshwater, MD;  Location: [facility];  Service: Ophthalmology;  Laterality: Right;  Diabetic - diet controlled Potassium draw  before surgery  . CATARACT EXTRACTION W/PHACO Left 01/20/2017   Procedure: CATARACT EXTRACTION PHACO AND INTRAOCULAR LENS PLACEMENT (IOC) LEFT DIABETIC;  Surgeon: Eulogio Bear, MD;  Location: [place];  Service: Ophthalmology;  Laterality: Left;  Diabetic - diet controlled Potassium draw prior to procedure  8:00 ARRIVAL  . COLONOSCOPY    . COLONOSCOPY WITH PROPOFOL N/A 11/28/2014   Procedure: COLONOSCOPY WITH PROPOFOL;  Surgeon: Manya Silvas, MD;  Location: [facility];  Service: Endoscopy;  Laterality: N/A;  . CORONARY ARTERY BYPASS GRAFT    . DIALYSIS/PERMA CATHETER INSERTION Left 01/21/2019   Procedure: DIALYSIS/PERMA CATHETER EXCHANGE;  Surgeon: Algernon Huxley, MD;  Location: [facility];  Service: Cardiovascular;  Laterality: Left;  . INSERTION OF DIALYSIS CATHETER Left 01/19/2019   Procedure: INSERTION OF DIALYSIS CATHETER;  Surgeon: Katha Cabal, MD;  Location: [facility];  Service: Vascular;  Laterality: Left;  . LIGATIONS OF HERO GRAFT Left 01/19/2019   Procedure: EXCISION LEFT HERO GRAFT;  Surgeon: Katha Cabal, MD;  Location: [facility];  Service: Vascular;  Laterality: Left;  . PERIPHERAL VASCULAR CATHETERIZATION Left 01/18/2015   Procedure: A/V Shuntogram/Fistulagram;  Surgeon: Katha Cabal, MD;  Location: [facility] [facility];  Service: Cardiovascular;  Laterality: Left;  . PERIPHERAL VASCULAR CATHETERIZATION Left 01/18/2015   Procedure: A/V Shunt Intervention;  Surgeon: Katha Cabal, MD;  Location: [facility];  Service: Cardiovascular;  Laterality: Left;  . PERIPHERAL VASCULAR CATHETERIZATION  N/A 08/10/2015   Procedure: Thrombectomy;  Surgeon: Algernon Huxley, MD;  Location: [facility];  Service: Cardiovascular;  Laterality: N/A;  . PERIPHERAL VASCULAR CATHETERIZATION N/A 08/10/2015   Procedure: A/V Shuntogram/Fistulagram;  Surgeon: Algernon Huxley, MD;  Location: [place];  Service: Cardiovascular;  Laterality: N/A;   . PERIPHERAL VASCULAR CATHETERIZATION N/A 08/10/2015   Procedure: A/V Shunt Intervention;  Surgeon: Algernon Huxley, MD;  Location: [facility];  Service: Cardiovascular;  Laterality: N/A;  . PERIPHERAL VASCULAR THROMBECTOMY Left 01/12/2019   Procedure: PERIPHERAL VASCULAR THROMBECTOMY;  Surgeon: Katha Cabal, MD;  Location: [facility];  Service: Cardiovascular;  Laterality: Left;  . REMOVAL OF A DIALYSIS CATHETER Right 01/19/2019   Procedure: REMOVAL OF A DIALYSIS CATHETER;  Surgeon: Katha Cabal, MD;  Location: [facility];  Service: Vascular;  Laterality: Right;  . RENAL SHUNTS        Allergies  Allergen Reactions  . Codeine Sulfate Nausea Only  . Contrast Media [Iodinated Diagnostic Agents] Hives  . Gadobutrol Hives  . Gadolinium Derivatives Hives  . Penicillins Hives and Itching    Has patient had a PCN reaction causing immediate rash, facial/tongue/throat swelling, SOB or lightheadedness with hypotension: No Has patient had a PCN reaction causing severe rash involving mucus membranes or skin necrosis: No Has patient had a PCN reaction that required hospitalization: No Has patient had a PCN reaction occurring within the last 10 years: No If all of the above answers are "NO", then may proceed with Cephalosporin use.   . Statins Other (See Comments)    muscle pain    Current Outpatient Medications  Medication Sig Dispense Refill  . acetaminophen (TYLENOL) 500 MG tablet Tylenol Extra Strength 500 mg tablet  Take 2 tablets every 8 hours by oral route as needed.    [name] aspirin 81 MG tablet Take 81 mg by mouth daily.    . cinacalcet (SENSIPAR) 30 MG tablet Take 30 mg by mouth 3 (three) times a week. On dialysis days - Tues, Thurs, Sat    . diclofenac sodium (VOLTAREN) 1 % GEL APP 4 GRAMS EXT AA UP TO QID FOR PAIN. DO NOT EXCEED 16 GM FOR LE JOINT PER DAY    . DiphenhydrAMINE HCl, Sleep, 50 MG tablet Take 1 tablet by mouth 30 minutes before CT scan    . DUREZOL  0.05 % EMUL Place 1 drop into both eyes 2 (two) times daily.  0  . isosorbide mononitrate (IMDUR) 30 MG 24 hr tablet Take 30 mg by mouth daily.    [name] latanoprost (XALATAN) 0.005 % ophthalmic solution Place 1 drop into both eyes 2 (two) times daily.     [name] oxyCODONE-acetaminophen (PERCOCET) 5-325 MG tablet Take 1-2 tablets by mouth every 6 (six) hours as needed for moderate pain or severe pain. (Patient not taking: Reported on 03/15/2019) 50 tablet 0  . POLYETHYLENE GLYCOL 3350 PO as needed.     . pravastatin (PRAVACHOL) 20 MG tablet Take 20 mg by mouth daily.    . ranitidine (ZANTAC) 150 MG tablet Take 150 mg by mouth 2 (two) times daily.     No current facility-administered medications for this visit.         Physical Exam There were no vitals taken for this visit. Gen:  WD/WN, NAD Skin: incision C/D/I, left arm HERO graft excision healed     Assessment/Plan: 1. Complication of vascular access for dialysis, sequela Recommend:  At this time  the patient does not have appropriate extremity access for dialysis.  Her catheter is working well at this point  Patient should have a left arm HERO graft created in January.  We need to allow the scar tissue to soften before redoing the graft  The risks, benefits and alternative therapies were reviewed in detail with the patient.  All questions were answered.  The patient wishes to return to the office in approximately 1 month she is not want to proceed with surgery before the holidays.  2. ESRD (end stage renal disease) ([place]) At the present time the patient does not have adequate dialysis access. Arrangements will be made to correct this as noted above.  Continue hemodialysis as ordered without interruption.  Avoid nephrotoxic medications and dehydration.  Further plans per nephrology  3. Superior vena cava compression syndrome She will require a HERO graft  4. Coronary artery disease involving native coronary artery of native  heart with angina pectoris ([place]) Continue cardiac and antihypertensive medications as already ordered and reviewed, no changes at this time.  Continue statin as ordered and reviewed, no changes at this time  Nitrates PRN for chest pain   5. Type 2 diabetes mellitus with chronic kidney disease on chronic dialysis, unspecified whether long term insulin use ([place]) Continue hypoglycemic medications as already ordered, these medications have been reviewed and there are no changes at this time.  Hgb A1C to be monitored as already arranged by primary service       Hortencia Pilar 04/14/2019, 8:27 AM   This note was created with Dragon medical transcription system.  Any errors from dictation are unintentional.

## 2019-04-19 ENCOUNTER — Encounter (INDEPENDENT_AMBULATORY_CARE_PROVIDER_SITE_OTHER): Payer: Self-pay | Admitting: Vascular Surgery

## 2019-04-19 ENCOUNTER — Ambulatory Visit (INDEPENDENT_AMBULATORY_CARE_PROVIDER_SITE_OTHER): Payer: Medicare Other | Admitting: Vascular Surgery

## 2019-04-19 ENCOUNTER — Encounter (INDEPENDENT_AMBULATORY_CARE_PROVIDER_SITE_OTHER): Payer: Self-pay

## 2019-04-19 ENCOUNTER — Other Ambulatory Visit: Payer: Self-pay

## 2019-04-19 VITALS — BP 179/82 | HR 73 | Resp 16 | Wt 175.4 lb

## 2019-04-19 DIAGNOSIS — E1122 Type 2 diabetes mellitus with diabetic chronic kidney disease: Secondary | ICD-10-CM

## 2019-04-19 DIAGNOSIS — Z992 Dependence on renal dialysis: Secondary | ICD-10-CM

## 2019-04-19 DIAGNOSIS — T829XXS Unspecified complication of cardiac and vascular prosthetic device, implant and graft, sequela: Secondary | ICD-10-CM

## 2019-04-19 DIAGNOSIS — I871 Compression of vein: Secondary | ICD-10-CM

## 2019-04-19 DIAGNOSIS — I25119 Atherosclerotic heart disease of native coronary artery with unspecified angina pectoris: Secondary | ICD-10-CM

## 2019-04-19 DIAGNOSIS — N186 End stage renal disease: Secondary | ICD-10-CM

## 2019-04-23 ENCOUNTER — Other Ambulatory Visit: Payer: Self-pay | Admitting: Physician Assistant

## 2019-04-23 DIAGNOSIS — Z9181 History of falling: Secondary | ICD-10-CM

## 2019-04-23 DIAGNOSIS — Z1231 Encounter for screening mammogram for malignant neoplasm of breast: Secondary | ICD-10-CM

## 2019-04-26 ENCOUNTER — Encounter (INDEPENDENT_AMBULATORY_CARE_PROVIDER_SITE_OTHER): Payer: Self-pay | Admitting: Vascular Surgery

## 2019-05-17 ENCOUNTER — Other Ambulatory Visit: Payer: Self-pay

## 2019-05-17 ENCOUNTER — Ambulatory Visit (INDEPENDENT_AMBULATORY_CARE_PROVIDER_SITE_OTHER): Payer: Medicare Other | Admitting: Vascular Surgery

## 2019-05-17 ENCOUNTER — Encounter (INDEPENDENT_AMBULATORY_CARE_PROVIDER_SITE_OTHER): Payer: Self-pay

## 2019-05-17 ENCOUNTER — Encounter (INDEPENDENT_AMBULATORY_CARE_PROVIDER_SITE_OTHER): Payer: Self-pay | Admitting: Vascular Surgery

## 2019-05-17 VITALS — BP 161/80 | HR 71 | Resp 16 | Wt 172.6 lb

## 2019-05-17 DIAGNOSIS — N186 End stage renal disease: Secondary | ICD-10-CM | POA: Diagnosis not present

## 2019-05-17 DIAGNOSIS — I25119 Atherosclerotic heart disease of native coronary artery with unspecified angina pectoris: Secondary | ICD-10-CM

## 2019-05-17 DIAGNOSIS — T829XXS Unspecified complication of cardiac and vascular prosthetic device, implant and graft, sequela: Secondary | ICD-10-CM

## 2019-05-17 DIAGNOSIS — I871 Compression of vein: Secondary | ICD-10-CM | POA: Diagnosis not present

## 2019-05-17 NOTE — Progress Notes (Signed)
MRN : [id number]  KARRON GOENS is a 74 y.o. ([birth year]) female who presents with chief complaint of No chief complaint on file. [name]  History of Present Illness:   Patient returns the office for follow-up care status post excision of an infected left arm hero graft on January 19, 2019. She did require exchange of her left IJ tunneled catheter 2 days later secondary to malfunction of the catheter. A 27 tip to cuff catheter was placed at that time. She reports her catheter is been working well.   She denies fever chills.  She has no complaints regarding her left armwhich is now healed. She denies drainage she denies bleeding  No outpatient medications have been marked as taking for the 05/17/19 encounter (Appointment) with Delana Meyer, Dolores Lory, MD.    Past Medical History:  Diagnosis Date  . Anemia   . Chronic cough   . Chronic kidney disease   . Coronary artery disease   . Diabetes mellitus without complication ([place])   . Dialysis patient ([place])    T-TH-SAT  . GERD (gastroesophageal reflux disease)   . Hypertension   . Neuropathy    hands  . Wears dentures    partial upper and lower    Past Surgical History:  Procedure Laterality Date  . A/V SHUNT INTERVENTION N/A 10/16/2016   Procedure: A/V Shunt Intervention;  Surgeon: Katha Cabal, MD;  Location: [place] [facility];  Service: Cardiovascular;  Laterality: N/A;  . A/V SHUNTOGRAM Left 10/16/2016   Procedure: A/V Shuntogram;  Surgeon: Katha Cabal, MD;  Location: [place] [facility];  Service: Cardiovascular;  Laterality: Left;  . ABDOMINAL HYSTERECTOMY    . CARPAL TUNNEL RELEASE Left 09/27/2015   Procedure: CARPAL TUNNEL RELEASE;  Surgeon: Leanor Kail, MD;  Location: [facility];  Service: Orthopedics;  Laterality: Left;  . CATARACT EXTRACTION W/PHACO Right 09/23/2016   Procedure: CATARACT EXTRACTION PHACO AND INTRAOCULAR LENS PLACEMENT (IOC) complicated diabetic right;  Surgeon: Ronnell Freshwater, MD;  Location: [place];  Service: Ophthalmology;  Laterality: Right;  Diabetic - diet controlled Potassium draw before surgery  . CATARACT EXTRACTION W/PHACO Left 01/20/2017   Procedure: CATARACT EXTRACTION PHACO AND INTRAOCULAR LENS PLACEMENT (IOC) LEFT DIABETIC;  Surgeon: Eulogio Bear, MD;  Location: [place];  Service: Ophthalmology;  Laterality: Left;  Diabetic - diet controlled Potassium draw prior to procedure  8:00 ARRIVAL  . COLONOSCOPY    . COLONOSCOPY WITH PROPOFOL N/A 11/28/2014   Procedure: COLONOSCOPY WITH PROPOFOL;  Surgeon: Manya Silvas, MD;  Location: [facility];  Service: Endoscopy;  Laterality: N/A;  . CORONARY ARTERY BYPASS GRAFT    . DIALYSIS/PERMA CATHETER INSERTION Left 01/21/2019   Procedure: DIALYSIS/PERMA CATHETER EXCHANGE;  Surgeon: Algernon Huxley, MD;  Location: [place] [facility];  Service: Cardiovascular;  Laterality: Left;  . INSERTION OF DIALYSIS CATHETER Left 01/19/2019   Procedure: INSERTION OF DIALYSIS CATHETER;  Surgeon: Katha Cabal, MD;  Location: [facility];  Service: Vascular;  Laterality: Left;  . LIGATIONS OF HERO GRAFT Left 01/19/2019   Procedure: EXCISION LEFT HERO GRAFT;  Surgeon: Katha Cabal, MD;  Location: [facility];  Service: Vascular;  Laterality: Left;  . PERIPHERAL VASCULAR CATHETERIZATION Left 01/18/2015   Procedure: A/V Shuntogram/Fistulagram;  Surgeon: Katha Cabal, MD;  Location: [place] [facility];  Service: Cardiovascular;  Laterality: Left;  . PERIPHERAL VASCULAR CATHETERIZATION Left 01/18/2015   Procedure: A/V Shunt Intervention;  Surgeon: Katha Cabal, MD;  Location:  [place] [facility];  Service: Cardiovascular;  Laterality: Left;  . PERIPHERAL VASCULAR CATHETERIZATION N/A 08/10/2015   Procedure: Thrombectomy;  Surgeon: Algernon Huxley, MD;  Location: [place] [facility];  Service: Cardiovascular;  Laterality: N/A;  . PERIPHERAL VASCULAR CATHETERIZATION N/A 08/10/2015   Procedure:  A/V Shuntogram/Fistulagram;  Surgeon: Algernon Huxley, MD;  Location: [place] [facility];  Service: Cardiovascular;  Laterality: N/A;  . PERIPHERAL VASCULAR CATHETERIZATION N/A 08/10/2015   Procedure: A/V Shunt Intervention;  Surgeon: Algernon Huxley, MD;  Location: [place] [facility];  Service: Cardiovascular;  Laterality: N/A;  . PERIPHERAL VASCULAR THROMBECTOMY Left 01/12/2019   Procedure: PERIPHERAL VASCULAR THROMBECTOMY;  Surgeon: Katha Cabal, MD;  Location: [place] [facility];  Service: Cardiovascular;  Laterality: Left;  . REMOVAL OF A DIALYSIS CATHETER Right 01/19/2019   Procedure: REMOVAL OF A DIALYSIS CATHETER;  Surgeon: Katha Cabal, MD;  Location: [facility];  Service: Vascular;  Laterality: Right;  . RENAL SHUNTS      Social History Social History   Tobacco Use  . Smoking status: Never Smoker  . Smokeless tobacco: Never Used  Substance Use Topics  . Alcohol use: No  . Drug use: No    Family History No family history on file.  Allergies  Allergen Reactions  . Codeine Sulfate Nausea Only  . Contrast Media [Iodinated Diagnostic Agents] Hives  . Gadobutrol Hives  . Gadolinium Derivatives Hives  . Penicillins Hives and Itching    Has patient had a PCN reaction causing immediate rash, facial/tongue/throat swelling, SOB or lightheadedness with hypotension: No Has patient had a PCN reaction causing severe rash involving mucus membranes or skin necrosis: No Has patient had a PCN reaction that required hospitalization: No Has patient had a PCN reaction occurring within the last 10 years: No If all of the above answers are "NO", then may proceed with Cephalosporin use.   . Statins Other (See Comments)    muscle pain     REVIEW OF SYSTEMS (Negative unless checked)  Constitutional: [] Weight loss  [] Fever  [] Chills Cardiac: [] Chest pain   [] Chest pressure   [] Palpitations   [] Shortness of breath when laying flat   [] Shortness of breath with exertion. Vascular:  [] Pain  in legs with walking   [] Pain in legs at rest  [] History of DVT   [] Phlebitis   [] Swelling in legs   [] Varicose veins   [] Non-healing ulcers Pulmonary:   [] Uses home oxygen   [] Productive cough   [] Hemoptysis   [] Wheeze  [] COPD   [] Asthma Neurologic:  [] Dizziness   [] Seizures   [] History of stroke   [] History of TIA  [] Aphasia   [] Vissual changes   [] Weakness or numbness in arm   [] Weakness or numbness in leg Musculoskeletal:   [] Joint swelling   [] Joint pain   [] Low back pain Hematologic:  [] Easy bruising  [] Easy bleeding   [] Hypercoagulable state   [] Anemic Gastrointestinal:  [] Diarrhea   [] Vomiting  [] Gastroesophageal reflux/heartburn   [] Difficulty swallowing. Genitourinary:  [x] Chronic kidney disease   [] Difficult urination  [] Frequent urination   [] Blood in urine Skin:  [] Rashes   [] Ulcers  Psychological:  [] History of anxiety   []  History of major depression.  Physical Examination  There were no vitals filed for this visit. There is no height or weight on file to calculate BMI. Gen: WD/WN, NAD Head: [place]/AT, No temporalis wasting.  Ear/Nose/Throat: Hearing grossly intact, nares w/o erythema or drainage Eyes: PER, EOMI, sclera nonicteric.  Neck: Supple, no large  masses.   Pulmonary:  Good air movement, no audible wheezing bilaterally, no use of accessory muscles.  Cardiac: RRR, no JVD Vascular: right IJ cath CD&I, left arm is healed Vessel Right Left  Radial Palpable Palpable  Brachial Palpable Palpable  Gastrointestinal: Non-distended. No guarding/no peritoneal signs.  Musculoskeletal: M/S 5/5 throughout.  No deformity or atrophy.  Neurologic: CN 2-12 intact. Symmetrical.  Speech is fluent. Motor exam as listed above. Psychiatric: Judgment intact, Mood & affect appropriate for pt's clinical situation. Dermatologic: No rashes or ulcers noted.  No changes consistent with cellulitis. Lymph : No lichenification or skin changes of chronic lymphedema.  CBC Lab Results  Component  Value Date   WBC 7.8 01/21/2019   HGB 7.2 (L) 01/21/2019   HCT 23.1 (L) 01/21/2019   MCV 87.2 01/21/2019   PLT 146 (L) 01/21/2019    BMET    Component Value Date/Time   NA 142 01/21/2019 0504   NA 135 (L) 03/29/2013 1409   K 5.2 (H) 01/21/2019 0504   K 4.5 03/29/2013 1409   CL 104 01/21/2019 0504   CL 97 (L) 03/29/2013 1409   CO2 25 01/21/2019 0504   CO2 27 03/29/2013 1409   GLUCOSE 102 (H) 01/21/2019 0504   GLUCOSE 97 03/29/2013 1409   BUN 72 (H) 01/21/2019 0504   BUN 48 (H) 03/29/2013 1409   CREATININE 12.69 (H) 01/21/2019 0504   CREATININE 12.06 (H) 03/29/2013 1409   CALCIUM 7.8 (L) 01/21/2019 0504   CALCIUM 8.8 03/29/2013 1409   GFRNONAA 3 (L) 01/21/2019 0504   GFRNONAA 3 (L) 03/29/2013 1409   GFRAA 3 (L) 01/21/2019 0504   GFRAA 3 (L) 03/29/2013 1409   CrCl cannot be calculated (Patient's most recent lab result is older than the maximum 21 days allowed.).  COAG Lab Results  Component Value Date   INR 1.0 01/19/2019    Radiology No results found.   Assessment/Plan 1. Complication of vascular access for dialysis, sequela Recommend:  The patient is doing well and currently has adequate dialysis access. The patient's dialysis center is not reporting any major access issues.  Given the COVID concern asnd the current restrictions in the OR I will see the  patient will follow-up with me in the office in one month at which time I will plan venograms and creation of a HERO   2. ESRD (end stage renal disease) ([place]) At the present time the patient has adequate dialysis access.  Continue hemodialysis as ordered without interruption.  Avoid nephrotoxic medications and dehydration.  Further plans per nephrology  3. Coronary artery disease involving native coronary artery of native heart with angina pectoris ([place]) Continue cardiac and antihypertensive medications as already ordered and reviewed, no changes at this time.  Continue statin as ordered and reviewed,  no changes at this time  Nitrates PRN for chest pain   4. Superior vena cava compression syndrome Will need venograms to plan for new access    Hortencia Pilar, MD  05/17/2019 9:13 AM

## 2019-05-24 ENCOUNTER — Encounter: Payer: Self-pay | Admitting: Podiatry

## 2019-05-24 ENCOUNTER — Ambulatory Visit (INDEPENDENT_AMBULATORY_CARE_PROVIDER_SITE_OTHER): Payer: Medicare Other | Admitting: Podiatry

## 2019-05-24 ENCOUNTER — Other Ambulatory Visit: Payer: Self-pay

## 2019-05-24 DIAGNOSIS — M79675 Pain in left toe(s): Secondary | ICD-10-CM

## 2019-05-24 DIAGNOSIS — M79674 Pain in right toe(s): Secondary | ICD-10-CM | POA: Diagnosis not present

## 2019-05-24 DIAGNOSIS — B351 Tinea unguium: Secondary | ICD-10-CM | POA: Diagnosis not present

## 2019-05-24 DIAGNOSIS — E1159 Type 2 diabetes mellitus with other circulatory complications: Secondary | ICD-10-CM | POA: Diagnosis not present

## 2019-05-24 NOTE — Progress Notes (Signed)
This patient presents to the office with chief complaint of long thick nails and diabetic feet.  This patient  says there  is   pain and discomfort in her  Feet due to neuropathy..  This patient says there are long thick painful nails.  These nails are painful walking and wearing shoes.  Patient has no history of infection or drainage from both feet.  Patient is unable to  self treat his own nails . This patient presents  to the office today for treatment of the  long nails and a foot evaluation due to history of  diabetes.  General Appearance  Alert, conversant and in no acute stress.  Vascular  Dorsalis pedis and posterior tibial  pulses are not  palpable  bilaterally.  Capillary return is within normal limits  bilaterally. Temperature is within normal limits  bilaterally.  Neurologic  Senn-Weinstein monofilament wire test within normal limits  bilaterally. Muscle power within normal limits bilaterally.  Nails Thick disfigured discolored nails with subungual debris  from hallux to fifth toes bilaterally. No evidence of bacterial infection or drainage bilaterally.  Orthopedic  No limitations of motion of motion feet .  No crepitus or effusions noted.  No bony pathology or digital deformities noted.  Skin  normotropic skin with no porokeratosis noted bilaterally.  No signs of infections or ulcers noted.     Onychomycosis  Diabetes with no foot complications  IE  Debride nails x 10.  A diabetic foot exam was performed and there is no evidence of neurologic pathology.   Vascular pathology noted.   RTC 3 months.   Gardiner Barefoot DPM

## 2019-06-21 ENCOUNTER — Other Ambulatory Visit: Payer: Self-pay

## 2019-06-21 ENCOUNTER — Ambulatory Visit (INDEPENDENT_AMBULATORY_CARE_PROVIDER_SITE_OTHER): Payer: Medicare Other | Admitting: Vascular Surgery

## 2019-06-21 ENCOUNTER — Encounter (INDEPENDENT_AMBULATORY_CARE_PROVIDER_SITE_OTHER): Payer: Self-pay

## 2019-06-21 ENCOUNTER — Encounter (INDEPENDENT_AMBULATORY_CARE_PROVIDER_SITE_OTHER): Payer: Self-pay | Admitting: Vascular Surgery

## 2019-06-21 VITALS — BP 141/83 | HR 83 | Resp 12 | Ht 59.0 in | Wt 173.0 lb

## 2019-06-21 DIAGNOSIS — I25119 Atherosclerotic heart disease of native coronary artery with unspecified angina pectoris: Secondary | ICD-10-CM

## 2019-06-21 DIAGNOSIS — N186 End stage renal disease: Secondary | ICD-10-CM | POA: Diagnosis not present

## 2019-06-21 DIAGNOSIS — I871 Compression of vein: Secondary | ICD-10-CM | POA: Diagnosis not present

## 2019-06-21 DIAGNOSIS — T829XXS Unspecified complication of cardiac and vascular prosthetic device, implant and graft, sequela: Secondary | ICD-10-CM | POA: Diagnosis not present

## 2019-06-21 DIAGNOSIS — K219 Gastro-esophageal reflux disease without esophagitis: Secondary | ICD-10-CM

## 2019-06-21 NOTE — Progress Notes (Signed)
MRN : [id number]  Kelli Nguyen is a 74 y.o. ([birth year]) female who presents with chief complaint of No chief complaint on file. [name]  History of Present Illness:   Patient returns the office for follow-up care status post excision of an infected left arm hero graft on January 19, 2019. She did require exchange of her left IJ tunneled catheter 2 days later secondary to malfunction of the catheter. A 27 tip to cuff catheter was placed at that time. She reports her catheter is been working well.   She denies fever chills.  She has no complaints regarding her left armwhich is now healed. She denies drainage she denies bleeding  No outpatient medications have been marked as taking for the 06/21/19 encounter (Appointment) with Delana Meyer, Dolores Lory, MD.    Past Medical History:  Diagnosis Date  . Anemia   . Chronic cough   . Chronic kidney disease   . Coronary artery disease   . Diabetes mellitus without complication ([place])   . Dialysis patient ([place])    T-TH-SAT  . GERD (gastroesophageal reflux disease)   . Hypertension   . Neuropathy    hands  . Wears dentures    partial upper and lower    Past Surgical History:  Procedure Laterality Date  . A/V SHUNT INTERVENTION N/A 10/16/2016   Procedure: A/V Shunt Intervention;  Surgeon: Katha Cabal, MD;  Location: [place] [facility];  Service: Cardiovascular;  Laterality: N/A;  . A/V SHUNTOGRAM Left 10/16/2016   Procedure: A/V Shuntogram;  Surgeon: Katha Cabal, MD;  Location: [place] [facility];  Service: Cardiovascular;  Laterality: Left;  . ABDOMINAL HYSTERECTOMY    . CARPAL TUNNEL RELEASE Left 09/27/2015   Procedure: CARPAL TUNNEL RELEASE;  Surgeon: Leanor Kail, MD;  Location: [facility];  Service: Orthopedics;  Laterality: Left;  . CATARACT EXTRACTION W/PHACO Right 09/23/2016   Procedure: CATARACT EXTRACTION PHACO AND INTRAOCULAR LENS PLACEMENT (IOC) complicated diabetic right;  Surgeon: Ronnell Freshwater, MD;  Location: [place];  Service: Ophthalmology;  Laterality: Right;  Diabetic - diet controlled Potassium draw before surgery  . CATARACT EXTRACTION W/PHACO Left 01/20/2017   Procedure: CATARACT EXTRACTION PHACO AND INTRAOCULAR LENS PLACEMENT (IOC) LEFT DIABETIC;  Surgeon: Eulogio Bear, MD;  Location: [place];  Service: Ophthalmology;  Laterality: Left;  Diabetic - diet controlled Potassium draw prior to procedure  8:00 ARRIVAL  . COLONOSCOPY    . COLONOSCOPY WITH PROPOFOL N/A 11/28/2014   Procedure: COLONOSCOPY WITH PROPOFOL;  Surgeon: Manya Silvas, MD;  Location: [facility];  Service: Endoscopy;  Laterality: N/A;  . CORONARY ARTERY BYPASS GRAFT    . DIALYSIS/PERMA CATHETER INSERTION Left 01/21/2019   Procedure: DIALYSIS/PERMA CATHETER EXCHANGE;  Surgeon: Algernon Huxley, MD;  Location: [place] [facility];  Service: Cardiovascular;  Laterality: Left;  . INSERTION OF DIALYSIS CATHETER Left 01/19/2019   Procedure: INSERTION OF DIALYSIS CATHETER;  Surgeon: Katha Cabal, MD;  Location: [facility];  Service: Vascular;  Laterality: Left;  . LIGATIONS OF HERO GRAFT Left 01/19/2019   Procedure: EXCISION LEFT HERO GRAFT;  Surgeon: Katha Cabal, MD;  Location: [facility];  Service: Vascular;  Laterality: Left;  . PERIPHERAL VASCULAR CATHETERIZATION Left 01/18/2015   Procedure: A/V Shuntogram/Fistulagram;  Surgeon: Katha Cabal, MD;  Location: [place] [facility];  Service: Cardiovascular;  Laterality: Left;  . PERIPHERAL VASCULAR CATHETERIZATION Left 01/18/2015   Procedure: A/V Shunt Intervention;  Surgeon: Katha Cabal, MD;  Location:  [place] [facility];  Service: Cardiovascular;  Laterality: Left;  . PERIPHERAL VASCULAR CATHETERIZATION N/A 08/10/2015   Procedure: Thrombectomy;  Surgeon: Algernon Huxley, MD;  Location: [place] [facility];  Service: Cardiovascular;  Laterality: N/A;  . PERIPHERAL VASCULAR CATHETERIZATION N/A 08/10/2015   Procedure:  A/V Shuntogram/Fistulagram;  Surgeon: Algernon Huxley, MD;  Location: [place] [facility];  Service: Cardiovascular;  Laterality: N/A;  . PERIPHERAL VASCULAR CATHETERIZATION N/A 08/10/2015   Procedure: A/V Shunt Intervention;  Surgeon: Algernon Huxley, MD;  Location: [place] [facility];  Service: Cardiovascular;  Laterality: N/A;  . PERIPHERAL VASCULAR THROMBECTOMY Left 01/12/2019   Procedure: PERIPHERAL VASCULAR THROMBECTOMY;  Surgeon: Katha Cabal, MD;  Location: [place] [facility];  Service: Cardiovascular;  Laterality: Left;  . REMOVAL OF A DIALYSIS CATHETER Right 01/19/2019   Procedure: REMOVAL OF A DIALYSIS CATHETER;  Surgeon: Katha Cabal, MD;  Location: [facility];  Service: Vascular;  Laterality: Right;  . RENAL SHUNTS      Social History Social History   Tobacco Use  . Smoking status: Never Smoker  . Smokeless tobacco: Never Used  Substance Use Topics  . Alcohol use: No  . Drug use: No    Family History Family History  Problem Relation Age of Onset  . Hypertension Sister     Allergies  Allergen Reactions  . Codeine Sulfate Nausea Only  . Contrast Media [Iodinated Diagnostic Agents] Hives  . Gadobutrol Hives  . Gadolinium Derivatives Hives  . Penicillins Hives and Itching    Has patient had a PCN reaction causing immediate rash, facial/tongue/throat swelling, SOB or lightheadedness with hypotension: No Has patient had a PCN reaction causing severe rash involving mucus membranes or skin necrosis: No Has patient had a PCN reaction that required hospitalization: No Has patient had a PCN reaction occurring within the last 10 years: No If all of the above answers are "NO", then may proceed with Cephalosporin use.   . Statins Other (See Comments)    muscle pain     REVIEW OF SYSTEMS (Negative unless checked)  Constitutional: [] Weight loss  [] Fever  [] Chills Cardiac: [] Chest pain   [] Chest pressure   [] Palpitations   [] Shortness of breath when laying flat    [] Shortness of breath with exertion. Vascular:  [] Pain in legs with walking   [] Pain in legs at rest  [] History of DVT   [] Phlebitis   [] Swelling in legs   [] Varicose veins   [] Non-healing ulcers Pulmonary:   [] Uses home oxygen   [] Productive cough   [] Hemoptysis   [] Wheeze  [] COPD   [x] Asthma Neurologic:  [] Dizziness   [] Seizures   [] History of stroke   [] History of TIA  [] Aphasia   [] Vissual changes   [] Weakness or numbness in arm   [] Weakness or numbness in leg Musculoskeletal:   [] Joint swelling   [] Joint pain   [] Low back pain Hematologic:  [] Easy bruising  [] Easy bleeding   [] Hypercoagulable state   [] Anemic Gastrointestinal:  [] Diarrhea   [] Vomiting  [x] Gastroesophageal reflux/heartburn   [] Difficulty swallowing. Genitourinary:  [x] Chronic kidney disease   [] Difficult urination  [] Frequent urination   [] Blood in urine Skin:  [] Rashes   [] Ulcers  Psychological:  [] History of anxiety   []  History of major depression.  Physical Examination  There were no vitals filed for this visit. There is no height or weight on file to calculate BMI. Gen: WD/WN, NAD Head: [place]/AT, No temporalis wasting.  Ear/Nose/Throat: Hearing grossly intact, nares w/o erythema or drainage  Eyes: PER, EOMI, sclera nonicteric.  Neck: Supple, no large masses.   Pulmonary:  Good air movement, no audible wheezing bilaterally, no use of accessory muscles.  Cardiac: RRR, no JVD Vascular:  Left IJ catheter CD&I Gastrointestinal: Non-distended. No guarding/no peritoneal signs.  Musculoskeletal: M/S 5/5 throughout.  No deformity or atrophy.  Neurologic: CN 2-12 intact. Symmetrical.  Speech is fluent. Motor exam as listed above. Psychiatric: Judgment intact, Mood & affect appropriate for pt's clinical situation. Dermatologic: No rashes or ulcers noted.  No changes consistent with cellulitis.  CBC Lab Results  Component Value Date   WBC 7.8 01/21/2019   HGB 7.2 (L) 01/21/2019   HCT 23.1 (L) 01/21/2019   MCV 87.2  01/21/2019   PLT 146 (L) 01/21/2019    BMET    Component Value Date/Time   NA 142 01/21/2019 0504   NA 135 (L) 03/29/2013 1409   K 5.2 (H) 01/21/2019 0504   K 4.5 03/29/2013 1409   CL 104 01/21/2019 0504   CL 97 (L) 03/29/2013 1409   CO2 25 01/21/2019 0504   CO2 27 03/29/2013 1409   GLUCOSE 102 (H) 01/21/2019 0504   GLUCOSE 97 03/29/2013 1409   BUN 72 (H) 01/21/2019 0504   BUN 48 (H) 03/29/2013 1409   CREATININE 12.69 (H) 01/21/2019 0504   CREATININE 12.06 (H) 03/29/2013 1409   CALCIUM 7.8 (L) 01/21/2019 0504   CALCIUM 8.8 03/29/2013 1409   GFRNONAA 3 (L) 01/21/2019 0504   GFRNONAA 3 (L) 03/29/2013 1409   GFRAA 3 (L) 01/21/2019 0504   GFRAA 3 (L) 03/29/2013 1409   CrCl cannot be calculated (Patient's most recent lab result is older than the maximum 21 days allowed.).  COAG Lab Results  Component Value Date   INR 1.0 01/19/2019    Radiology No results found.   Assessment/Plan 1. Complication of vascular access for dialysis, sequela Recommend:  The patient is doing well and currently has adequate dialysis access. The patient's dialysis center is not reporting any major access issues.  Given the patient's COVID concern she has asked to follow up in May.  She will follow-up with me in the office in one 2 months at which time I will again plan venograms and creation of a HERO  2. ESRD (end stage renal disease) ([place]) At the present time the patient has adequate dialysis access.  Continue hemodialysis as ordered without interruption.  Avoid nephrotoxic medications and dehydration.  Further plans per nephrology  3. Coronary artery disease involving native coronary artery of native heart with angina pectoris ([place]) Continue cardiac and antihypertensive medications as already ordered and reviewed, no changes at this time.  Continue statin as ordered and reviewed, no changes at this time  Nitrates PRN for chest pain   4. Superior vena cava compression  syndrome See #1  5. Gastroesophageal reflux disease, unspecified whether esophagitis present Continue PPI as already ordered, this medication has been reviewed and there are no changes at this time.  Avoidence of caffeine and alcohol  Moderate elevation of the head of the bed    Hortencia Pilar, MD  06/21/2019 9:05 AM

## 2019-08-23 ENCOUNTER — Ambulatory Visit: Payer: Medicare Other | Admitting: Podiatry

## 2019-08-30 ENCOUNTER — Encounter: Payer: Self-pay | Admitting: Podiatry

## 2019-08-30 ENCOUNTER — Ambulatory Visit (INDEPENDENT_AMBULATORY_CARE_PROVIDER_SITE_OTHER): Payer: Medicare Other | Admitting: Podiatry

## 2019-08-30 ENCOUNTER — Other Ambulatory Visit: Payer: Self-pay

## 2019-08-30 VITALS — Temp 97.3°F

## 2019-08-30 DIAGNOSIS — M79674 Pain in right toe(s): Secondary | ICD-10-CM

## 2019-08-30 DIAGNOSIS — E1159 Type 2 diabetes mellitus with other circulatory complications: Secondary | ICD-10-CM

## 2019-08-30 DIAGNOSIS — B351 Tinea unguium: Secondary | ICD-10-CM | POA: Diagnosis not present

## 2019-08-30 DIAGNOSIS — M79675 Pain in left toe(s): Secondary | ICD-10-CM | POA: Diagnosis not present

## 2019-08-30 DIAGNOSIS — N186 End stage renal disease: Secondary | ICD-10-CM

## 2019-08-30 NOTE — Progress Notes (Signed)
This patient returns to my office for at risk foot care.  This patient requires this care by a professional since this patient will be at risk due to having diabetes with vascular disease and ESRD.  This patient is unable to cut nails herself since the patient cannot reach her nails.These nails are painful walking and wearing shoes.  This patient presents for at risk foot care today.  General Appearance  Alert, conversant and in no acute stress.  Vascular  Dorsalis pedis and posterior tibial  pulses are not  palpable  bilaterally.  Capillary return is within normal limits  bilaterally. Temperature is within normal limits  bilaterally.  Neurologic  Senn-Weinstein monofilament wire test within normal limits  bilaterally. Muscle power within normal limits bilaterally.  Nails Thick disfigured discolored nails with subungual debris  from hallux to fifth toes bilaterally. No evidence of bacterial infection or drainage bilaterally.  Orthopedic  No limitations of motion  feet .  No crepitus or effusions noted.  No bony pathology or digital deformities noted.  Skin  normotropic skin with no porokeratosis noted bilaterally.  No signs of infections or ulcers noted.     Onychomycosis  Pain in right toes  Pain in left toes  Consent was obtained for treatment procedures.   Mechanical debridement of nails 1-5  bilaterally performed with a nail nipper.  Filed with dremel without incident.    Return office visit   3 months                   Told patient to return for periodic foot care and evaluation due to potential at risk complications.   Gardiner Barefoot DPM

## 2019-09-06 ENCOUNTER — Ambulatory Visit (INDEPENDENT_AMBULATORY_CARE_PROVIDER_SITE_OTHER): Payer: Medicare Other | Admitting: Vascular Surgery

## 2019-09-06 ENCOUNTER — Encounter (INDEPENDENT_AMBULATORY_CARE_PROVIDER_SITE_OTHER): Payer: Self-pay | Admitting: Vascular Surgery

## 2019-09-06 ENCOUNTER — Other Ambulatory Visit: Payer: Self-pay

## 2019-09-06 VITALS — BP 153/83 | HR 85 | Ht 59.0 in | Wt 170.0 lb

## 2019-09-06 DIAGNOSIS — I871 Compression of vein: Secondary | ICD-10-CM | POA: Diagnosis not present

## 2019-09-06 DIAGNOSIS — I25119 Atherosclerotic heart disease of native coronary artery with unspecified angina pectoris: Secondary | ICD-10-CM

## 2019-09-06 DIAGNOSIS — M79606 Pain in leg, unspecified: Secondary | ICD-10-CM | POA: Insufficient documentation

## 2019-09-06 DIAGNOSIS — N186 End stage renal disease: Secondary | ICD-10-CM | POA: Diagnosis not present

## 2019-09-06 DIAGNOSIS — T829XXS Unspecified complication of cardiac and vascular prosthetic device, implant and graft, sequela: Secondary | ICD-10-CM

## 2019-09-06 DIAGNOSIS — M79605 Pain in left leg: Secondary | ICD-10-CM | POA: Diagnosis not present

## 2019-09-06 DIAGNOSIS — E1159 Type 2 diabetes mellitus with other circulatory complications: Secondary | ICD-10-CM

## 2019-09-06 NOTE — Progress Notes (Signed)
MRN : [id number]  Kelli Nguyen is a 74 y.o. (06/19/1945) female who presents with chief complaint of No chief complaint on file. [name]  History of Present Illness:  Patient returns the office for follow-up care status post excision of an infected left arm hero graft on January 19, 2019. She did require exchange of her left IJ tunneled catheter 2 days later secondary to malfunction of the catheter. A 27 tip to cuff catheter was placed at that time. She reports her catheter is been working well.   She denies fever chills.  She has no complaints regarding her left armwhich is now healed. She denies drainage she denies bleeding.  Is a new problem she is complaining of increasing pain in her left lower extremity.  She is describing both shortening of her claudication distance as well as mild rest pain.  There are no previous studies to evaluate her lower extremities for the degree of PAD that she has.  No outpatient medications have been marked as taking for the 09/06/19 encounter (Appointment) with Delana Meyer, Dolores Lory, MD.    Past Medical History:  Diagnosis Date  . Anemia   . Chronic cough   . Chronic kidney disease   . Coronary artery disease   . Diabetes mellitus without complication ([place])   . Dialysis patient ([place])    T-TH-SAT  . GERD (gastroesophageal reflux disease)   . Hypertension   . Neuropathy    hands  . Wears dentures    partial upper and lower    Past Surgical History:  Procedure Laterality Date  . A/V SHUNT INTERVENTION N/A 10/16/2016   Procedure: A/V Shunt Intervention;  Surgeon: Katha Cabal, MD;  Location: [place] [facility];  Service: Cardiovascular;  Laterality: N/A;  . A/V SHUNTOGRAM Left 10/16/2016   Procedure: A/V Shuntogram;  Surgeon: Katha Cabal, MD;  Location: [place] [facility];  Service: Cardiovascular;  Laterality: Left;  . ABDOMINAL HYSTERECTOMY    . CARPAL TUNNEL RELEASE Left 09/27/2015   Procedure: CARPAL TUNNEL RELEASE;   Surgeon: Leanor Kail, MD;  Location: [facility];  Service: Orthopedics;  Laterality: Left;  . CATARACT EXTRACTION W/PHACO Right 09/23/2016   Procedure: CATARACT EXTRACTION PHACO AND INTRAOCULAR LENS PLACEMENT (IOC) complicated diabetic right;  Surgeon: Ronnell Freshwater, MD;  Location: [place];  Service: Ophthalmology;  Laterality: Right;  Diabetic - diet controlled Potassium draw before surgery  . CATARACT EXTRACTION W/PHACO Left 01/20/2017   Procedure: CATARACT EXTRACTION PHACO AND INTRAOCULAR LENS PLACEMENT (IOC) LEFT DIABETIC;  Surgeon: Eulogio Bear, MD;  Location: [place];  Service: Ophthalmology;  Laterality: Left;  Diabetic - diet controlled Potassium draw prior to procedure  8:00 ARRIVAL  . COLONOSCOPY    . COLONOSCOPY WITH PROPOFOL N/A 11/28/2014   Procedure: COLONOSCOPY WITH PROPOFOL;  Surgeon: Manya Silvas, MD;  Location: [facility];  Service: Endoscopy;  Laterality: N/A;  . CORONARY ARTERY BYPASS GRAFT    . DIALYSIS/PERMA CATHETER INSERTION Left 01/21/2019   Procedure: DIALYSIS/PERMA CATHETER EXCHANGE;  Surgeon: Algernon Huxley, MD;  Location: [place] [facility];  Service: Cardiovascular;  Laterality: Left;  . INSERTION OF DIALYSIS CATHETER Left 01/19/2019   Procedure: INSERTION OF DIALYSIS CATHETER;  Surgeon: Katha Cabal, MD;  Location: [facility];  Service: Vascular;  Laterality: Left;  . LIGATIONS OF HERO GRAFT Left 01/19/2019   Procedure: EXCISION LEFT HERO GRAFT;  Surgeon: Katha Cabal, MD;  Location: [facility];  Service: Vascular;  Laterality: Left;  .  PERIPHERAL VASCULAR CATHETERIZATION Left 01/18/2015   Procedure: A/V Shuntogram/Fistulagram;  Surgeon: Katha Cabal, MD;  Location: [place] [facility];  Service: Cardiovascular;  Laterality: Left;  . PERIPHERAL VASCULAR CATHETERIZATION Left 01/18/2015   Procedure: A/V Shunt Intervention;  Surgeon: Katha Cabal, MD;  Location: [place] [facility];  Service:  Cardiovascular;  Laterality: Left;  . PERIPHERAL VASCULAR CATHETERIZATION N/A 08/10/2015   Procedure: Thrombectomy;  Surgeon: Algernon Huxley, MD;  Location: [place] [facility];  Service: Cardiovascular;  Laterality: N/A;  . PERIPHERAL VASCULAR CATHETERIZATION N/A 08/10/2015   Procedure: A/V Shuntogram/Fistulagram;  Surgeon: Algernon Huxley, MD;  Location: [place] [facility];  Service: Cardiovascular;  Laterality: N/A;  . PERIPHERAL VASCULAR CATHETERIZATION N/A 08/10/2015   Procedure: A/V Shunt Intervention;  Surgeon: Algernon Huxley, MD;  Location: [place] [facility];  Service: Cardiovascular;  Laterality: N/A;  . PERIPHERAL VASCULAR THROMBECTOMY Left 01/12/2019   Procedure: PERIPHERAL VASCULAR THROMBECTOMY;  Surgeon: Katha Cabal, MD;  Location: [place] [facility];  Service: Cardiovascular;  Laterality: Left;  . REMOVAL OF A DIALYSIS CATHETER Right 01/19/2019   Procedure: REMOVAL OF A DIALYSIS CATHETER;  Surgeon: Katha Cabal, MD;  Location: [facility];  Service: Vascular;  Laterality: Right;  . RENAL SHUNTS      Social History Social History   Tobacco Use  . Smoking status: Never Smoker  . Smokeless tobacco: Never Used  Substance Use Topics  . Alcohol use: No  . Drug use: No    Family History Family History  Problem Relation Age of Onset  . Hypertension Sister     Allergies  Allergen Reactions  . Codeine Sulfate Nausea Only  . Contrast Media [Iodinated Diagnostic Agents] Hives  . Gadobutrol Hives  . Gadolinium Derivatives Hives  . Penicillins Hives and Itching    Has patient had a PCN reaction causing immediate rash, facial/tongue/throat swelling, SOB or lightheadedness with hypotension: No Has patient had a PCN reaction causing severe rash involving mucus membranes or skin necrosis: No Has patient had a PCN reaction that required hospitalization: No Has patient had a PCN reaction occurring within the last 10 years: No If all of the above answers are "NO", then may proceed  with Cephalosporin use.   . Statins Other (See Comments)    muscle pain     REVIEW OF SYSTEMS (Negative unless checked)  Constitutional: [] Weight loss  [] Fever  [] Chills Cardiac: [] Chest pain   [] Chest pressure   [] Palpitations   [] Shortness of breath when laying flat   [] Shortness of breath with exertion. Vascular:  [x] Pain in legs with walking   [x] Pain in legs at rest  [] History of DVT   [] Phlebitis   [] Swelling in legs   [] Varicose veins   [] Non-healing ulcers Pulmonary:   [] Uses home oxygen   [] Productive cough   [] Hemoptysis   [] Wheeze  [] COPD   [] Asthma Neurologic:  [] Dizziness   [] Seizures   [] History of stroke   [] History of TIA  [] Aphasia   [] Vissual changes   [] Weakness or numbness in arm   [] Weakness or numbness in leg Musculoskeletal:   [] Joint swelling   [x] Joint pain   [] Low back pain Hematologic:  [] Easy bruising  [] Easy bleeding   [] Hypercoagulable state   [] Anemic Gastrointestinal:  [] Diarrhea   [] Vomiting  [] Gastroesophageal reflux/heartburn   [] Difficulty swallowing. Genitourinary:  [x] Chronic kidney disease   [] Difficult urination  [] Frequent urination   [] Blood in urine Skin:  [] Rashes   [] Ulcers  Psychological:  [] History of  anxiety   []  History of major depression.  Physical Examination  There were no vitals filed for this visit. There is no height or weight on file to calculate BMI. Gen: WD/WN, NAD Head: [place]/AT, No temporalis wasting.  Ear/Nose/Throat: Hearing grossly intact, nares w/o erythema or drainage Eyes: PER, EOMI, sclera nonicteric.  Neck: Supple, no large masses.   Pulmonary:  Good air movement, no audible wheezing bilaterally, no use of accessory muscles.  Cardiac: RRR, no JVD Vascular: Left arm is well-healed. Vessel Right Left  Radial Palpable Palpable  PT  not palpable  not palpable  DP  not palpable  not palpable  Gastrointestinal: Non-distended. No guarding/no peritoneal signs.  Musculoskeletal: M/S 5/5 throughout.  No deformity or  atrophy.  Neurologic: CN 2-12 intact. Symmetrical.  Speech is fluent. Motor exam as listed above. Psychiatric: Judgment intact, Mood & affect appropriate for pt's clinical situation. Dermatologic: No rashes or ulcers noted.  No changes consistent with cellulitis.  CBC Lab Results  Component Value Date   WBC 7.8 01/21/2019   HGB 7.2 (L) 01/21/2019   HCT 23.1 (L) 01/21/2019   MCV 87.2 01/21/2019   PLT 146 (L) 01/21/2019    BMET    Component Value Date/Time   NA 142 01/21/2019 0504   NA 135 (L) 03/29/2013 1409   K 5.2 (H) 01/21/2019 0504   K 4.5 03/29/2013 1409   CL 104 01/21/2019 0504   CL 97 (L) 03/29/2013 1409   CO2 25 01/21/2019 0504   CO2 27 03/29/2013 1409   GLUCOSE 102 (H) 01/21/2019 0504   GLUCOSE 97 03/29/2013 1409   BUN 72 (H) 01/21/2019 0504   BUN 48 (H) 03/29/2013 1409   CREATININE 12.69 (H) 01/21/2019 0504   CREATININE 12.06 (H) 03/29/2013 1409   CALCIUM 7.8 (L) 01/21/2019 0504   CALCIUM 8.8 03/29/2013 1409   GFRNONAA 3 (L) 01/21/2019 0504   GFRNONAA 3 (L) 03/29/2013 1409   GFRAA 3 (L) 01/21/2019 0504   GFRAA 3 (L) 03/29/2013 1409   CrCl cannot be calculated (Patient's most recent lab result is older than the maximum 21 days allowed.).  COAG Lab Results  Component Value Date   INR 1.0 01/19/2019    Radiology No results found.  Assessment/Plan 1. Complication of vascular access for dialysis, sequela I again discussed our plan for venography and imaging of her central vein so we can determine whether a hero graft is feasible.  Patient states given her leg pain she is not ready to move forward with new dialysis access.  2. Pain of left lower extremity Patient has worsening of her lower extremity symptoms.  Pulses are nonpalpable.  Therefore noninvasive studies will be obtained to determine whether her worsening symptoms are secondary to degenerative disease or to vascular disease. - VAS [place] ABI WITH/WO TBI; Future  3. Superior vena cava compression  syndrome Patient will require a hero graft.  4. ESRD (end stage renal disease) ([place]) At the present time the patient has adequate dialysis access.  Continue hemodialysis as ordered without interruption.  Avoid nephrotoxic medications and dehydration.  Further plans per nephrology  5. Coronary artery disease involving native coronary artery of native heart with angina pectoris ([place]) Continue cardiac and antihypertensive medications as already ordered and reviewed, no changes at this time.  Continue statin as ordered and reviewed, no changes at this time  Nitrates PRN for chest pain   6. Type 2 diabetes mellitus with vascular disease ([place]) Continue hypoglycemic medications as already ordered, these medications  have been reviewed and there are no changes at this time.  Hgb A1C to be monitored as already arranged by primary service    Hortencia Pilar, MD  09/06/2019 8:37 AM

## 2019-09-27 ENCOUNTER — Encounter (INDEPENDENT_AMBULATORY_CARE_PROVIDER_SITE_OTHER): Payer: Self-pay | Admitting: Vascular Surgery

## 2019-09-27 ENCOUNTER — Ambulatory Visit (INDEPENDENT_AMBULATORY_CARE_PROVIDER_SITE_OTHER): Payer: Medicare Other

## 2019-09-27 ENCOUNTER — Other Ambulatory Visit: Payer: Self-pay

## 2019-09-27 ENCOUNTER — Ambulatory Visit (INDEPENDENT_AMBULATORY_CARE_PROVIDER_SITE_OTHER): Payer: Medicare Other | Admitting: Vascular Surgery

## 2019-09-27 VITALS — BP 172/113 | HR 73 | Ht 59.0 in | Wt 170.0 lb

## 2019-09-27 DIAGNOSIS — I70212 Atherosclerosis of native arteries of extremities with intermittent claudication, left leg: Secondary | ICD-10-CM | POA: Diagnosis not present

## 2019-09-27 DIAGNOSIS — I25119 Atherosclerotic heart disease of native coronary artery with unspecified angina pectoris: Secondary | ICD-10-CM | POA: Diagnosis not present

## 2019-09-27 DIAGNOSIS — M79605 Pain in left leg: Secondary | ICD-10-CM | POA: Diagnosis not present

## 2019-09-27 DIAGNOSIS — N186 End stage renal disease: Secondary | ICD-10-CM | POA: Diagnosis not present

## 2019-09-27 DIAGNOSIS — E782 Mixed hyperlipidemia: Secondary | ICD-10-CM

## 2019-09-27 DIAGNOSIS — E1159 Type 2 diabetes mellitus with other circulatory complications: Secondary | ICD-10-CM | POA: Diagnosis not present

## 2019-09-27 DIAGNOSIS — I70219 Atherosclerosis of native arteries of extremities with intermittent claudication, unspecified extremity: Secondary | ICD-10-CM | POA: Insufficient documentation

## 2019-09-27 NOTE — Progress Notes (Signed)
MRN : [id number]  Kelli Nguyen is a 74 y.o. ([birth year]) female who presents with chief complaint of  Chief Complaint  Patient presents with  . Follow-up    U/S Follow up  .  History of Present Illness:  The patient returns to the office for followup and review of the noninvasive studies. There have been no interval changes in lower extremity symptoms. No interval shortening of the patient's claudication distance or development of rest pain symptoms. No new ulcers or wounds have occurred since the last visit.  There have been no significant changes to the patient's overall health care.   The patient is also seen for evaluation of dialysis access.  The patient has a history of multiple failed accesses.  There have been accesses in both arms.    Current access is via a catheter which is functioning well.  There have not been any episodes of catheter infection.  The patient denies amaurosis fugax or recent TIA symptoms. There are no recent neurological changes noted. The patient denies history of DVT, PE or superficial thrombophlebitis. The patient denies recent episodes of angina or shortness of breath.   ABI Rt=[place] and Lt=[place]  (biphasic signals)   Current Meds  Medication Sig  . acetaminophen (TYLENOL) 500 MG tablet   . aspirin 81 MG tablet Take 81 mg by mouth daily.  . cinacalcet (SENSIPAR) 30 MG tablet Take 30 mg by mouth 3 (three) times a week. On dialysis days - Tues, Thurs, Sat  . diclofenac sodium (VOLTAREN) 1 % GEL APP 4 GRAMS EXT AA UP TO QID FOR PAIN. DO NOT EXCEED 16 GM FOR LE JOINT PER DAY  . DiphenhydrAMINE HCl, Sleep, 50 MG tablet Take 1 tablet by mouth 30 minutes before CT scan  . DUREZOL 0.05 % EMUL Place 1 drop into both eyes 2 (two) times daily.  . isosorbide mononitrate (IMDUR) 30 MG 24 hr tablet Take 30 mg by mouth daily.  [name] latanoprost (XALATAN) 0.005 % ophthalmic solution Place 1 drop into both eyes 2 (two) times daily.   [name] oxyCODONE-acetaminophen  (PERCOCET) 5-325 MG tablet Take 1-2 tablets by mouth every 6 (six) hours as needed for moderate pain or severe pain.  [name] POLYETHYLENE GLYCOL 3350 PO as needed.   . pravastatin (PRAVACHOL) 20 MG tablet Take 20 mg by mouth daily.  . ranitidine (ZANTAC) 150 MG tablet Take 150 mg by mouth 2 (two) times daily.  . sevelamer carbonate (RENVELA) 800 MG tablet Take by mouth.  . Timolol Maleate 0.5 % (DAILY) SOLN Apply 1 drop to eye every morning.  . traMADol (ULTRAM) 50 MG tablet Take by mouth.    Past Medical History:  Diagnosis Date  . Anemia   . Chronic cough   . Chronic kidney disease   . Coronary artery disease   . Diabetes mellitus without complication ([place])   . Dialysis patient ([place])    T-TH-SAT  . GERD (gastroesophageal reflux disease)   . Hypertension   . Neuropathy    hands  . Wears dentures    partial upper and lower    Past Surgical History:  Procedure Laterality Date  . A/V SHUNT INTERVENTION N/A 10/16/2016   Procedure: A/V Shunt Intervention;  Surgeon: Katha Cabal, MD;  Location: [place] [facility];  Service: Cardiovascular;  Laterality: N/A;  . A/V SHUNTOGRAM Left 10/16/2016   Procedure: A/V Shuntogram;  Surgeon: Katha Cabal, MD;  Location: [place] [facility];  Service: Cardiovascular;  Laterality: Left;  .  ABDOMINAL HYSTERECTOMY    . CARPAL TUNNEL RELEASE Left 09/27/2015   Procedure: CARPAL TUNNEL RELEASE;  Surgeon: Leanor Kail, MD;  Location: [facility];  Service: Orthopedics;  Laterality: Left;  . CATARACT EXTRACTION W/PHACO Right 09/23/2016   Procedure: CATARACT EXTRACTION PHACO AND INTRAOCULAR LENS PLACEMENT (IOC) complicated diabetic right;  Surgeon: Ronnell Freshwater, MD;  Location: [place];  Service: Ophthalmology;  Laterality: Right;  Diabetic - diet controlled Potassium draw before surgery  . CATARACT EXTRACTION W/PHACO Left 01/20/2017   Procedure: CATARACT EXTRACTION PHACO AND INTRAOCULAR LENS PLACEMENT (IOC) LEFT DIABETIC;   Surgeon: Eulogio Bear, MD;  Location: [place];  Service: Ophthalmology;  Laterality: Left;  Diabetic - diet controlled Potassium draw prior to procedure  8:00 ARRIVAL  . COLONOSCOPY    . COLONOSCOPY WITH PROPOFOL N/A 11/28/2014   Procedure: COLONOSCOPY WITH PROPOFOL;  Surgeon: Manya Silvas, MD;  Location: [facility];  Service: Endoscopy;  Laterality: N/A;  . CORONARY ARTERY BYPASS GRAFT    . DIALYSIS/PERMA CATHETER INSERTION Left 01/21/2019   Procedure: DIALYSIS/PERMA CATHETER EXCHANGE;  Surgeon: Algernon Huxley, MD;  Location: [place] [facility];  Service: Cardiovascular;  Laterality: Left;  . INSERTION OF DIALYSIS CATHETER Left 01/19/2019   Procedure: INSERTION OF DIALYSIS CATHETER;  Surgeon: Katha Cabal, MD;  Location: [facility];  Service: Vascular;  Laterality: Left;  . LIGATIONS OF HERO GRAFT Left 01/19/2019   Procedure: EXCISION LEFT HERO GRAFT;  Surgeon: Katha Cabal, MD;  Location: [facility];  Service: Vascular;  Laterality: Left;  . PERIPHERAL VASCULAR CATHETERIZATION Left 01/18/2015   Procedure: A/V Shuntogram/Fistulagram;  Surgeon: Katha Cabal, MD;  Location: [place] [facility];  Service: Cardiovascular;  Laterality: Left;  . PERIPHERAL VASCULAR CATHETERIZATION Left 01/18/2015   Procedure: A/V Shunt Intervention;  Surgeon: Katha Cabal, MD;  Location: [place] [facility];  Service: Cardiovascular;  Laterality: Left;  . PERIPHERAL VASCULAR CATHETERIZATION N/A 08/10/2015   Procedure: Thrombectomy;  Surgeon: Algernon Huxley, MD;  Location: [place] [facility];  Service: Cardiovascular;  Laterality: N/A;  . PERIPHERAL VASCULAR CATHETERIZATION N/A 08/10/2015   Procedure: A/V Shuntogram/Fistulagram;  Surgeon: Algernon Huxley, MD;  Location: [place] [facility];  Service: Cardiovascular;  Laterality: N/A;  . PERIPHERAL VASCULAR CATHETERIZATION N/A 08/10/2015   Procedure: A/V Shunt Intervention;  Surgeon: Algernon Huxley, MD;  Location: [place] [facility];   Service: Cardiovascular;  Laterality: N/A;  . PERIPHERAL VASCULAR THROMBECTOMY Left 01/12/2019   Procedure: PERIPHERAL VASCULAR THROMBECTOMY;  Surgeon: Katha Cabal, MD;  Location: [place] [facility];  Service: Cardiovascular;  Laterality: Left;  . REMOVAL OF A DIALYSIS CATHETER Right 01/19/2019   Procedure: REMOVAL OF A DIALYSIS CATHETER;  Surgeon: Katha Cabal, MD;  Location: [facility];  Service: Vascular;  Laterality: Right;  . RENAL SHUNTS      Social History Social History   Tobacco Use  . Smoking status: Never Smoker  . Smokeless tobacco: Never Used  Substance Use Topics  . Alcohol use: No  . Drug use: No    Family History Family History  Problem Relation Age of Onset  . Hypertension Sister     Allergies  Allergen Reactions  . Codeine Sulfate Nausea Only  . Contrast Media [Iodinated Diagnostic Agents] Hives  . Gadobutrol Hives  . Gadolinium Derivatives Hives  . Penicillins Hives and Itching    Has patient had a PCN reaction causing immediate rash, facial/tongue/throat swelling, SOB or lightheadedness with hypotension: No Has patient  had a PCN reaction causing severe rash involving mucus membranes or skin necrosis: No Has patient had a PCN reaction that required hospitalization: No Has patient had a PCN reaction occurring within the last 10 years: No If all of the above answers are "NO", then may proceed with Cephalosporin use.   . Statins Other (See Comments)    muscle pain     REVIEW OF SYSTEMS (Negative unless checked)  Constitutional: [] Weight loss  [] Fever  [] Chills Cardiac: [] Chest pain   [] Chest pressure   [] Palpitations   [] Shortness of breath when laying flat   [] Shortness of breath with exertion. Vascular:  [x] Pain in legs with walking   [] Pain in legs at rest  [] History of DVT   [] Phlebitis   [] Swelling in legs   [] Varicose veins   [] Non-healing ulcers Pulmonary:   [] Uses home oxygen   [] Productive cough   [] Hemoptysis   [] Wheeze  [] COPD    [] Asthma Neurologic:  [] Dizziness   [] Seizures   [] History of stroke   [] History of TIA  [] Aphasia   [] Vissual changes   [] Weakness or numbness in arm   [] Weakness or numbness in leg Musculoskeletal:   [] Joint swelling   [] Joint pain   [] Low back pain Hematologic:  [] Easy bruising  [] Easy bleeding   [] Hypercoagulable state   [] Anemic Gastrointestinal:  [] Diarrhea   [] Vomiting  [] Gastroesophageal reflux/heartburn   [] Difficulty swallowing. Genitourinary:  [x] Chronic kidney disease   [] Difficult urination  [] Frequent urination   [] Blood in urine Skin:  [] Rashes   [] Ulcers  Psychological:  [] History of anxiety   []  History of major depression.  Physical Examination  Vitals:   09/27/19 1359  BP: (!) 172/113  Pulse: 73  Weight: 170 lb (77.1 kg)  Height: 4\' 11"  (1.499 m)   Body mass index is 34.34 kg/m. Gen: WD/WN, NAD Head: [place]/AT, No temporalis wasting.  Ear/Nose/Throat: Hearing grossly intact, nares w/o erythema or drainage Eyes: PER, EOMI, sclera nonicteric.  Neck: Supple, no large masses.   Pulmonary:  Good air movement, no audible wheezing bilaterally, no use of accessory muscles.  Cardiac: RRR, no JVD Vascular:  Catheter CD&I  Vessel Right Left  Radial Palpable Palpable  PT Not Palpable Not Palpable  DP Not Palpable Not Palpable  Gastrointestinal: Non-distended. No guarding/no peritoneal signs.  Musculoskeletal: M/S 5/5 throughout.  No deformity or atrophy.  Neurologic: CN 2-12 intact. Symmetrical.  Speech is fluent. Motor exam as listed above. Psychiatric: Judgment intact, Mood & affect appropriate for pt's clinical situation. Dermatologic: No rashes or ulcers noted.  No changes consistent with cellulitis.   CBC Lab Results  Component Value Date   WBC 7.8 01/21/2019   HGB 7.2 (L) 01/21/2019   HCT 23.1 (L) 01/21/2019   MCV 87.2 01/21/2019   PLT 146 (L) 01/21/2019    BMET    Component Value Date/Time   NA 142 01/21/2019 0504   NA 135 (L) 03/29/2013 1409   K 5.2  (H) 01/21/2019 0504   K 4.5 03/29/2013 1409   CL 104 01/21/2019 0504   CL 97 (L) 03/29/2013 1409   CO2 25 01/21/2019 0504   CO2 27 03/29/2013 1409   GLUCOSE 102 (H) 01/21/2019 0504   GLUCOSE 97 03/29/2013 1409   BUN 72 (H) 01/21/2019 0504   BUN 48 (H) 03/29/2013 1409   CREATININE 12.69 (H) 01/21/2019 0504   CREATININE 12.06 (H) 03/29/2013 1409   CALCIUM 7.8 (L) 01/21/2019 0504   CALCIUM 8.8 03/29/2013 1409   GFRNONAA 3 (L) 01/21/2019 5176  GFRNONAA 3 (L) 03/29/2013 1409   GFRAA 3 (L) 01/21/2019 0504   GFRAA 3 (L) 03/29/2013 1409   CrCl cannot be calculated (Patient's most recent lab result is older than the maximum 21 days allowed.).  COAG Lab Results  Component Value Date   INR 1.0 01/19/2019    Radiology No results found.   Assessment/Plan 1. Atherosclerosis of native artery of left lower extremity with intermittent claudication (HCC)  Recommend:  The patient has evidence of atherosclerosis of the lower extremities with claudication.  The patient does not voice lifestyle limiting changes at this point in time.  Noninvasive studies were reviewed with her and I did discuss angiography but she does not want to undergo angiography at this time.  No invasive studies, angiography or surgery at this time The patient should continue walking and begin a more formal exercise program.  The patient should continue antiplatelet therapy and aggressive treatment of the lipid abnormalities  No changes in the patient's medications at this time  The patient should continue wearing graduated compression socks 10-15 mmHg strength to control the mild edema.    2. ESRD (end stage renal disease) ([place]) At the present time the patient has adequate dialysis access.  Continue hemodialysis as ordered without interruption.  Avoid nephrotoxic medications and dehydration.  Further plans per nephrology  3. Coronary artery disease involving native coronary artery of native heart with  angina pectoris ([place]) Continue cardiac and antihypertensive medications as already ordered and reviewed, no changes at this time.  Continue statin as ordered and reviewed, no changes at this time  Nitrates PRN for chest pain   4. Type 2 diabetes mellitus with vascular disease ([place]) Continue hypoglycemic medications as already ordered, these medications have been reviewed and there are no changes at this time.  Hgb A1C to be monitored as already arranged by primary service   5. Mixed hyperlipidemia Continue statin as ordered and reviewed, no changes at this time    Hortencia Pilar, MD  09/27/2019 2:02 PM

## 2019-12-06 ENCOUNTER — Other Ambulatory Visit: Payer: Self-pay

## 2019-12-06 ENCOUNTER — Encounter: Payer: Self-pay | Admitting: Podiatry

## 2019-12-06 ENCOUNTER — Ambulatory Visit (INDEPENDENT_AMBULATORY_CARE_PROVIDER_SITE_OTHER): Payer: Medicare Other | Admitting: Podiatry

## 2019-12-06 DIAGNOSIS — M79675 Pain in left toe(s): Secondary | ICD-10-CM | POA: Diagnosis not present

## 2019-12-06 DIAGNOSIS — N186 End stage renal disease: Secondary | ICD-10-CM

## 2019-12-06 DIAGNOSIS — E1159 Type 2 diabetes mellitus with other circulatory complications: Secondary | ICD-10-CM

## 2019-12-06 DIAGNOSIS — B351 Tinea unguium: Secondary | ICD-10-CM | POA: Diagnosis not present

## 2019-12-06 DIAGNOSIS — M79674 Pain in right toe(s): Secondary | ICD-10-CM

## 2019-12-06 NOTE — Progress Notes (Signed)
This patient returns to my office for at risk foot care.  This patient requires this care by a professional since this patient will be at risk due to having diabetes with vascular disease and ESRD.  This patient is unable to cut nails herself since the patient cannot reach her nails.These nails are painful walking and wearing shoes.  This patient presents for at risk foot care today.  General Appearance  Alert, conversant and in no acute stress.  Vascular  Dorsalis pedis and posterior tibial  pulses are not  palpable  bilaterally.  Capillary return is within normal limits  bilaterally. Temperature is within normal limits  bilaterally.  Neurologic  Senn-Weinstein monofilament wire test within normal limits  bilaterally. Muscle power within normal limits bilaterally.  Nails Thick disfigured discolored nails with subungual debris  from hallux to fifth toes bilaterally. No evidence of bacterial infection or drainage bilaterally.  Orthopedic  No limitations of motion  feet .  No crepitus or effusions noted.  No bony pathology or digital deformities noted.  Skin  normotropic skin with no porokeratosis noted bilaterally.  No signs of infections or ulcers noted.     Onychomycosis  Pain in right toes  Pain in left toes  Consent was obtained for treatment procedures.   Mechanical debridement of nails 1-5  bilaterally performed with a nail nipper.  Filed with dremel without incident.    Return office visit   3 months                   Told patient to return for periodic foot care and evaluation due to potential at risk complications.   Gardiner Barefoot DPM

## 2019-12-27 ENCOUNTER — Encounter (INDEPENDENT_AMBULATORY_CARE_PROVIDER_SITE_OTHER): Payer: Self-pay | Admitting: Vascular Surgery

## 2019-12-27 ENCOUNTER — Other Ambulatory Visit (INDEPENDENT_AMBULATORY_CARE_PROVIDER_SITE_OTHER): Payer: Self-pay | Admitting: Vascular Surgery

## 2019-12-27 ENCOUNTER — Encounter (INDEPENDENT_AMBULATORY_CARE_PROVIDER_SITE_OTHER): Payer: Self-pay

## 2019-12-27 ENCOUNTER — Ambulatory Visit (INDEPENDENT_AMBULATORY_CARE_PROVIDER_SITE_OTHER): Payer: Medicare Other | Admitting: Vascular Surgery

## 2019-12-27 ENCOUNTER — Ambulatory Visit (INDEPENDENT_AMBULATORY_CARE_PROVIDER_SITE_OTHER): Payer: Medicare Other

## 2019-12-27 ENCOUNTER — Other Ambulatory Visit: Payer: Self-pay

## 2019-12-27 VITALS — BP 143/76 | HR 73 | Resp 16 | Wt 165.0 lb

## 2019-12-27 DIAGNOSIS — E1159 Type 2 diabetes mellitus with other circulatory complications: Secondary | ICD-10-CM

## 2019-12-27 DIAGNOSIS — I70212 Atherosclerosis of native arteries of extremities with intermittent claudication, left leg: Secondary | ICD-10-CM | POA: Diagnosis not present

## 2019-12-27 DIAGNOSIS — I25119 Atherosclerotic heart disease of native coronary artery with unspecified angina pectoris: Secondary | ICD-10-CM

## 2019-12-27 DIAGNOSIS — N186 End stage renal disease: Secondary | ICD-10-CM

## 2019-12-27 DIAGNOSIS — T829XXS Unspecified complication of cardiac and vascular prosthetic device, implant and graft, sequela: Secondary | ICD-10-CM | POA: Diagnosis not present

## 2019-12-27 DIAGNOSIS — I871 Compression of vein: Secondary | ICD-10-CM

## 2019-12-27 NOTE — Progress Notes (Signed)
MRN : [id number]  Kelli Nguyen is a 74 y.o. (Aug 28, 1945) female who presents with chief complaint of No chief complaint on file. [name]  History of Present Illness:   Patient returns the office for follow-up care status post excision of an infected left arm hero graft on January 19, 2019. She did require exchange of her left IJ tunneled catheter 2 days later secondary to malfunction of the catheter. A 27 tip to cuff catheter was placed at that time. She reports her catheter is been working well.   The patient returns to the office for followup she continues to have significant left lower extremity pain apparently she is undergoing injections for her hip. There have been no interval changes in lower extremity symptoms. No interval shortening of the patient's claudication distance or development of rest pain symptoms. No new ulcers or wounds have occurred since the last visit.  There have been no significant changes to the patient's overall health care.  The patient denies amaurosis fugax or recent TIA symptoms. There are no recent neurological changes noted. The patient denies history of DVT, PE or superficial thrombophlebitis. The patient denies recent episodes of angina or shortness of breath.   Previous ABI Rt=Hiko and Lt=Amity  (biphasic signals)  No outpatient medications have been marked as taking for the 12/27/19 encounter (Appointment) with Delana Meyer, Dolores Lory, MD.    Past Medical History:  Diagnosis Date  . Anemia   . Chronic cough   . Chronic kidney disease   . Coronary artery disease   . Diabetes mellitus without complication ([place])   . Dialysis patient ([place])    T-TH-SAT  . GERD (gastroesophageal reflux disease)   . Hypertension   . Neuropathy    hands  . Wears dentures    partial upper and lower    Past Surgical History:  Procedure Laterality Date  . A/V SHUNT INTERVENTION N/A 10/16/2016   Procedure: A/V Shunt Intervention;  Surgeon: Katha Cabal, MD;   Location: [place] [facility];  Service: Cardiovascular;  Laterality: N/A;  . A/V SHUNTOGRAM Left 10/16/2016   Procedure: A/V Shuntogram;  Surgeon: Katha Cabal, MD;  Location: [place] [facility];  Service: Cardiovascular;  Laterality: Left;  . ABDOMINAL HYSTERECTOMY    . CARPAL TUNNEL RELEASE Left 09/27/2015   Procedure: CARPAL TUNNEL RELEASE;  Surgeon: Leanor Kail, MD;  Location: [facility];  Service: Orthopedics;  Laterality: Left;  . CATARACT EXTRACTION W/PHACO Right 09/23/2016   Procedure: CATARACT EXTRACTION PHACO AND INTRAOCULAR LENS PLACEMENT (IOC) complicated diabetic right;  Surgeon: Ronnell Freshwater, MD;  Location: [place];  Service: Ophthalmology;  Laterality: Right;  Diabetic - diet controlled Potassium draw before surgery  . CATARACT EXTRACTION W/PHACO Left 01/20/2017   Procedure: CATARACT EXTRACTION PHACO AND INTRAOCULAR LENS PLACEMENT (IOC) LEFT DIABETIC;  Surgeon: Eulogio Bear, MD;  Location: [place];  Service: Ophthalmology;  Laterality: Left;  Diabetic - diet controlled Potassium draw prior to procedure  8:00 ARRIVAL  . COLONOSCOPY    . COLONOSCOPY WITH PROPOFOL N/A 11/28/2014   Procedure: COLONOSCOPY WITH PROPOFOL;  Surgeon: Manya Silvas, MD;  Location: [facility];  Service: Endoscopy;  Laterality: N/A;  . CORONARY ARTERY BYPASS GRAFT    . DIALYSIS/PERMA CATHETER INSERTION Left 01/21/2019   Procedure: DIALYSIS/PERMA CATHETER EXCHANGE;  Surgeon: Algernon Huxley, MD;  Location: [place] [facility];  Service: Cardiovascular;  Laterality: Left;  . INSERTION OF DIALYSIS CATHETER Left 01/19/2019   Procedure: INSERTION OF DIALYSIS  CATHETER;  Surgeon: Katha Cabal, MD;  Location: [facility];  Service: Vascular;  Laterality: Left;  . LIGATIONS OF HERO GRAFT Left 01/19/2019   Procedure: EXCISION LEFT HERO GRAFT;  Surgeon: Katha Cabal, MD;  Location: [facility];  Service: Vascular;  Laterality: Left;  . PERIPHERAL VASCULAR  CATHETERIZATION Left 01/18/2015   Procedure: A/V Shuntogram/Fistulagram;  Surgeon: Katha Cabal, MD;  Location: [place] [facility];  Service: Cardiovascular;  Laterality: Left;  . PERIPHERAL VASCULAR CATHETERIZATION Left 01/18/2015   Procedure: A/V Shunt Intervention;  Surgeon: Katha Cabal, MD;  Location: [place] [facility];  Service: Cardiovascular;  Laterality: Left;  . PERIPHERAL VASCULAR CATHETERIZATION N/A 08/10/2015   Procedure: Thrombectomy;  Surgeon: Algernon Huxley, MD;  Location: [place] [facility];  Service: Cardiovascular;  Laterality: N/A;  . PERIPHERAL VASCULAR CATHETERIZATION N/A 08/10/2015   Procedure: A/V Shuntogram/Fistulagram;  Surgeon: Algernon Huxley, MD;  Location: [place] [facility];  Service: Cardiovascular;  Laterality: N/A;  . PERIPHERAL VASCULAR CATHETERIZATION N/A 08/10/2015   Procedure: A/V Shunt Intervention;  Surgeon: Algernon Huxley, MD;  Location: [place] [facility];  Service: Cardiovascular;  Laterality: N/A;  . PERIPHERAL VASCULAR THROMBECTOMY Left 01/12/2019   Procedure: PERIPHERAL VASCULAR THROMBECTOMY;  Surgeon: Katha Cabal, MD;  Location: [place] [facility];  Service: Cardiovascular;  Laterality: Left;  . REMOVAL OF A DIALYSIS CATHETER Right 01/19/2019   Procedure: REMOVAL OF A DIALYSIS CATHETER;  Surgeon: Katha Cabal, MD;  Location: [facility];  Service: Vascular;  Laterality: Right;  . RENAL SHUNTS      Social History Social History   Tobacco Use  . Smoking status: Never Smoker  . Smokeless tobacco: Never Used  Vaping Use  . Vaping Use: Never used  Substance Use Topics  . Alcohol use: No  . Drug use: No    Family History Family History  Problem Relation Age of Onset  . Hypertension Sister     Allergies  Allergen Reactions  . Codeine Sulfate Nausea Only  . Contrast Media [Iodinated Diagnostic Agents] Hives  . Gadobutrol Hives  . Gadolinium Derivatives Hives  . Penicillins Hives and Itching    Has patient had a PCN reaction  causing immediate rash, facial/tongue/throat swelling, SOB or lightheadedness with hypotension: No Has patient had a PCN reaction causing severe rash involving mucus membranes or skin necrosis: No Has patient had a PCN reaction that required hospitalization: No Has patient had a PCN reaction occurring within the last 10 years: No If all of the above answers are "NO", then may proceed with Cephalosporin use.   . Statins Other (See Comments)    muscle pain     REVIEW OF SYSTEMS (Negative unless checked)  Constitutional: [] Weight loss  [] Fever  [] Chills Cardiac: [] Chest pain   [] Chest pressure   [] Palpitations   [] Shortness of breath when laying flat   [] Shortness of breath with exertion. Vascular:  [x] Pain in legs with walking   [] Pain in legs at rest  [] History of DVT   [] Phlebitis   [] Swelling in legs   [] Varicose veins   [] Non-healing ulcers Pulmonary:   [] Uses home oxygen   [] Productive cough   [] Hemoptysis   [] Wheeze  [] COPD   [] Asthma Neurologic:  [] Dizziness   [] Seizures   [] History of stroke   [] History of TIA  [] Aphasia   [] Vissual changes   [] Weakness or numbness in arm   [] Weakness or numbness in leg Musculoskeletal:   [] Joint swelling   [x] Joint pain   [  x]Low back pain Hematologic:  [] Easy bruising  [] Easy bleeding   [] Hypercoagulable state   [] Anemic Gastrointestinal:  [] Diarrhea   [] Vomiting  [] Gastroesophageal reflux/heartburn   [] Difficulty swallowing. Genitourinary:  [x] Chronic kidney disease   [] Difficult urination  [] Frequent urination   [] Blood in urine Skin:  [] Rashes   [] Ulcers  Psychological:  [] History of anxiety   []  History of major depression.  Physical Examination  There were no vitals filed for this visit. There is no height or weight on file to calculate BMI. Gen: WD/WN, NAD Head: [place]/AT, No temporalis wasting.  Ear/Nose/Throat: Hearing grossly intact, nares w/o erythema or drainage Eyes: PER, EOMI, sclera nonicteric.  Neck: Supple, no large masses.     Pulmonary:  Good air movement, no audible wheezing bilaterally, no use of accessory muscles.  Cardiac: RRR, no JVD Vascular: Right IJ catheter clean dry and intact Vessel Right Left  Radial Palpable Palpable  Gastrointestinal: Non-distended. No guarding/no peritoneal signs.  Musculoskeletal: M/S 5/5 throughout.  No deformity or atrophy.  Neurologic: CN 2-12 intact. Symmetrical.  Speech is fluent. Motor exam as listed above. Psychiatric: Judgment intact, Mood & affect appropriate for pt's clinical situation. Dermatologic: No rashes or ulcers noted.  No changes consistent with cellulitis. Lymph : No lichenification or skin changes of chronic lymphedema.  CBC Lab Results  Component Value Date   WBC 7.8 01/21/2019   HGB 7.2 (L) 01/21/2019   HCT 23.1 (L) 01/21/2019   MCV 87.2 01/21/2019   PLT 146 (L) 01/21/2019    BMET    Component Value Date/Time   NA 142 01/21/2019 0504   NA 135 (L) 03/29/2013 1409   K 5.2 (H) 01/21/2019 0504   K 4.5 03/29/2013 1409   CL 104 01/21/2019 0504   CL 97 (L) 03/29/2013 1409   CO2 25 01/21/2019 0504   CO2 27 03/29/2013 1409   GLUCOSE 102 (H) 01/21/2019 0504   GLUCOSE 97 03/29/2013 1409   BUN 72 (H) 01/21/2019 0504   BUN 48 (H) 03/29/2013 1409   CREATININE 12.69 (H) 01/21/2019 0504   CREATININE 12.06 (H) 03/29/2013 1409   CALCIUM 7.8 (L) 01/21/2019 0504   CALCIUM 8.8 03/29/2013 1409   GFRNONAA 3 (L) 01/21/2019 0504   GFRNONAA 3 (L) 03/29/2013 1409   GFRAA 3 (L) 01/21/2019 0504   GFRAA 3 (L) 03/29/2013 1409   CrCl cannot be calculated (Patient's most recent lab result is older than the maximum 21 days allowed.).  COAG Lab Results  Component Value Date   INR 1.0 01/19/2019    Radiology No results found.   Assessment/Plan 1. Complication of vascular access for dialysis, sequela I again discussed our plan for venography and imaging of her central vein so we can determine whether a hero graft is feasible.  Patient states given her leg  pain and the upcoming injection that is planned she is not ready to move forward with new dialysis access.  She is again reminded of the potential problems such as infection and sepsis that catheter-based dialysis entails.  She acknowledges these.  2. Atherosclerosis of native artery of left lower extremity with intermittent claudication (HCC)  Recommend:  The patient has evidence of atherosclerosis of the lower extremities with claudication.  The patient does not voice lifestyle limiting changes at this point in time.  Noninvasive studies do not suggest clinically significant change.  No invasive studies, angiography or surgery at this time The patient should continue walking and begin a more formal exercise program.  The patient should continue  antiplatelet therapy and aggressive treatment of the lipid abnormalities  No changes in the patient's medications at this time  The patient should continue wearing graduated compression socks 10-15 mmHg strength to control the mild edema.    3. Type 2 diabetes mellitus with vascular disease ([place]) Continue hypoglycemic medications as already ordered, these medications have been reviewed and there are no changes at this time.  Hgb A1C to be monitored as already arranged by primary service   4. Superior vena cava compression syndrome At the point the patient consents to move forward with upper extremity access venograms will be required so that we can plan for placement of a hero catheter if feasible otherwise a thigh access would be indicated  5. Coronary artery disease involving native coronary artery of native heart with angina pectoris ([place]) Continue cardiac and antihypertensive medications as already ordered and reviewed, no changes at this time.  Continue statin as ordered and reviewed, no changes at this time  Nitrates PRN for chest pain   6. ESRD (end stage renal disease) ([place]) At the present time the patient has adequate dialysis  access.  Continue hemodialysis as ordered without interruption.  Avoid nephrotoxic medications and dehydration.  Further plans per nephrology   Hortencia Pilar, MD  12/27/2019 8:36 AM

## 2019-12-28 ENCOUNTER — Encounter (INDEPENDENT_AMBULATORY_CARE_PROVIDER_SITE_OTHER): Payer: Self-pay | Admitting: Vascular Surgery

## 2019-12-30 DIAGNOSIS — Z9181 History of falling: Secondary | ICD-10-CM | POA: Insufficient documentation

## 2020-03-08 ENCOUNTER — Other Ambulatory Visit (INDEPENDENT_AMBULATORY_CARE_PROVIDER_SITE_OTHER): Payer: Self-pay | Admitting: Nurse Practitioner

## 2020-03-08 DIAGNOSIS — N186 End stage renal disease: Secondary | ICD-10-CM

## 2020-03-10 ENCOUNTER — Telehealth (INDEPENDENT_AMBULATORY_CARE_PROVIDER_SITE_OTHER): Payer: Self-pay

## 2020-03-10 ENCOUNTER — Ambulatory Visit (INDEPENDENT_AMBULATORY_CARE_PROVIDER_SITE_OTHER): Payer: Medicare Other

## 2020-03-10 ENCOUNTER — Ambulatory Visit (INDEPENDENT_AMBULATORY_CARE_PROVIDER_SITE_OTHER): Payer: PRIVATE HEALTH INSURANCE

## 2020-03-10 ENCOUNTER — Other Ambulatory Visit: Payer: Self-pay

## 2020-03-10 ENCOUNTER — Ambulatory Visit (INDEPENDENT_AMBULATORY_CARE_PROVIDER_SITE_OTHER): Payer: PRIVATE HEALTH INSURANCE | Admitting: Nurse Practitioner

## 2020-03-10 VITALS — BP 174/84 | HR 68 | Ht 59.0 in | Wt 166.0 lb

## 2020-03-10 DIAGNOSIS — I871 Compression of vein: Secondary | ICD-10-CM

## 2020-03-10 DIAGNOSIS — E782 Mixed hyperlipidemia: Secondary | ICD-10-CM

## 2020-03-10 DIAGNOSIS — T829XXS Unspecified complication of cardiac and vascular prosthetic device, implant and graft, sequela: Secondary | ICD-10-CM

## 2020-03-10 DIAGNOSIS — N186 End stage renal disease: Secondary | ICD-10-CM

## 2020-03-10 DIAGNOSIS — E1122 Type 2 diabetes mellitus with diabetic chronic kidney disease: Secondary | ICD-10-CM

## 2020-03-10 DIAGNOSIS — Z992 Dependence on renal dialysis: Secondary | ICD-10-CM

## 2020-03-10 NOTE — Telephone Encounter (Signed)
Spoke with the patient and she is scheduled with Dr. Lucky Cowboy for a RUE venogram on 03/20/20 with a 8:30 am arrival time to the [facility]. Covid testing on 03/16/20 between 8-1 pm at the [place]. Pre-procedure instructions were discussed and will be mailed.

## 2020-03-11 ENCOUNTER — Encounter (INDEPENDENT_AMBULATORY_CARE_PROVIDER_SITE_OTHER): Payer: Self-pay | Admitting: Nurse Practitioner

## 2020-03-11 NOTE — Progress Notes (Signed)
Subjective:    Patient ID: Kelli Nguyen, female    DOB: 12-04-1945, 74 y.o.   MRN: [id number] Chief Complaint  Patient presents with  . Follow-up    Vein mapping for new access    The patient returns to the office for follow up regarding problem with the dialysis access. Currently the patient is maintained via a permcath. The patient has had multiple failed upper extremity accesses.  The patient previously had a left upper extremity hero graft removed due to infection.  Since that time the patient has not had additional upper extremity access.  Currently her PermCath is working well.  The patient denies amaurosis fugax or recent TIA symptoms. There are no recent neurological changes noted. The patient denies claudication symptoms or rest pain symptoms. The patient denies history of DVT, PE or superficial thrombophlebitis. The patient denies recent episodes of angina or shortness of breath.   Today vein mapping indicates she has adequate access for a right brachial axillary AV graft.  However the patient also has superior vena cava compression syndrome, and the central veins were not able to be evaluated with ultrasound.    Review of Systems  Cardiovascular: Positive for leg swelling.  Neurological: Positive for weakness.  Hematological: Bruises/bleeds easily.  All other systems reviewed and are negative.      Objective:   Physical Exam Vitals reviewed.  HENT:     Head: Normocephalic.  Cardiovascular:     Rate and Rhythm: Normal rate.  Pulmonary:     Effort: Pulmonary effort is normal.  Neurological:     Mental Status: She is alert and oriented to person, place, and time.     Motor: Weakness present.     Gait: Gait abnormal.  Psychiatric:        Mood and Affect: Mood normal.        Behavior: Behavior normal.        Thought Content: Thought content normal.        Judgment: Judgment normal.     BP (!) 174/84   Pulse 68   Ht 4\' 11"  (1.499 m)   Wt 166 lb (75.3 kg)    BMI 33.53 kg/m   Past Medical History:  Diagnosis Date  . Anemia   . Chronic cough   . Chronic kidney disease   . Coronary artery disease   . Diabetes mellitus without complication ([place])   . Dialysis patient ([place])    T-TH-SAT  . GERD (gastroesophageal reflux disease)   . Hypertension   . Neuropathy    hands  . Wears dentures    partial upper and lower    Social History   Socioeconomic History  . Marital status: Widowed    Spouse name: Not on file  . Number of children: Not on file  . Years of education: Not on file  . Highest education level: Not on file  Occupational History  . Not on file  Tobacco Use  . Smoking status: Never Smoker  . Smokeless tobacco: Never Used  Vaping Use  . Vaping Use: Never used  Substance and Sexual Activity  . Alcohol use: No  . Drug use: No  . Sexual activity: Not on file  Other Topics Concern  . Not on file  Social History Narrative  . Not on file   Social Determinants of Health   Financial Resource Strain:   . Difficulty of Paying Living Expenses: Not on file  Food Insecurity:   . Worried About Running  Out of Food in the Last Year: Not on file  . Ran Out of Food in the Last Year: Not on file  Transportation Needs:   . Lack of Transportation (Medical): Not on file  . Lack of Transportation (Non-Medical): Not on file  Physical Activity:   . Days of Exercise per Week: Not on file  . Minutes of Exercise per Session: Not on file  Stress:   . Feeling of Stress : Not on file  Social Connections:   . Frequency of Communication with Friends and Family: Not on file  . Frequency of Social Gatherings with Friends and Family: Not on file  . Attends Religious Services: Not on file  . Active Member of Clubs or Organizations: Not on file  . Attends Archivist Meetings: Not on file  . Marital Status: Not on file  Intimate Partner Violence:   . Fear of Current or Ex-Partner: Not on file  . Emotionally Abused: Not on file  .  Physically Abused: Not on file  . Sexually Abused: Not on file    Past Surgical History:  Procedure Laterality Date  . A/V SHUNT INTERVENTION N/A 10/16/2016   Procedure: A/V Shunt Intervention;  Surgeon: Katha Cabal, MD;  Location: [place] [facility];  Service: Cardiovascular;  Laterality: N/A;  . A/V SHUNTOGRAM Left 10/16/2016   Procedure: A/V Shuntogram;  Surgeon: Katha Cabal, MD;  Location: [place] [facility];  Service: Cardiovascular;  Laterality: Left;  . ABDOMINAL HYSTERECTOMY    . CARPAL TUNNEL RELEASE Left 09/27/2015   Procedure: CARPAL TUNNEL RELEASE;  Surgeon: Leanor Kail, MD;  Location: [facility];  Service: Orthopedics;  Laterality: Left;  . CATARACT EXTRACTION W/PHACO Right 09/23/2016   Procedure: CATARACT EXTRACTION PHACO AND INTRAOCULAR LENS PLACEMENT (IOC) complicated diabetic right;  Surgeon: Ronnell Freshwater, MD;  Location: [place];  Service: Ophthalmology;  Laterality: Right;  Diabetic - diet controlled Potassium draw before surgery  . CATARACT EXTRACTION W/PHACO Left 01/20/2017   Procedure: CATARACT EXTRACTION PHACO AND INTRAOCULAR LENS PLACEMENT (IOC) LEFT DIABETIC;  Surgeon: Eulogio Bear, MD;  Location: [place];  Service: Ophthalmology;  Laterality: Left;  Diabetic - diet controlled Potassium draw prior to procedure  8:00 ARRIVAL  . COLONOSCOPY    . COLONOSCOPY WITH PROPOFOL N/A 11/28/2014   Procedure: COLONOSCOPY WITH PROPOFOL;  Surgeon: Manya Silvas, MD;  Location: [facility];  Service: Endoscopy;  Laterality: N/A;  . CORONARY ARTERY BYPASS GRAFT    . DIALYSIS/PERMA CATHETER INSERTION Left 01/21/2019   Procedure: DIALYSIS/PERMA CATHETER EXCHANGE;  Surgeon: Algernon Huxley, MD;  Location: [place] [facility];  Service: Cardiovascular;  Laterality: Left;  . INSERTION OF DIALYSIS CATHETER Left 01/19/2019   Procedure: INSERTION OF DIALYSIS CATHETER;  Surgeon: Katha Cabal, MD;  Location: [facility];  Service:  Vascular;  Laterality: Left;  . LIGATIONS OF HERO GRAFT Left 01/19/2019   Procedure: EXCISION LEFT HERO GRAFT;  Surgeon: Katha Cabal, MD;  Location: [facility];  Service: Vascular;  Laterality: Left;  . PERIPHERAL VASCULAR CATHETERIZATION Left 01/18/2015   Procedure: A/V Shuntogram/Fistulagram;  Surgeon: Katha Cabal, MD;  Location: [place] [facility];  Service: Cardiovascular;  Laterality: Left;  . PERIPHERAL VASCULAR CATHETERIZATION Left 01/18/2015   Procedure: A/V Shunt Intervention;  Surgeon: Katha Cabal, MD;  Location: [place] [facility];  Service: Cardiovascular;  Laterality: Left;  . PERIPHERAL VASCULAR CATHETERIZATION N/A 08/10/2015   Procedure: Thrombectomy;  Surgeon: Algernon Huxley, MD;  Location:  [place] [facility];  Service: Cardiovascular;  Laterality: N/A;  . PERIPHERAL VASCULAR CATHETERIZATION N/A 08/10/2015   Procedure: A/V Shuntogram/Fistulagram;  Surgeon: Algernon Huxley, MD;  Location: [place] [facility];  Service: Cardiovascular;  Laterality: N/A;  . PERIPHERAL VASCULAR CATHETERIZATION N/A 08/10/2015   Procedure: A/V Shunt Intervention;  Surgeon: Algernon Huxley, MD;  Location: [place] [facility];  Service: Cardiovascular;  Laterality: N/A;  . PERIPHERAL VASCULAR THROMBECTOMY Left 01/12/2019   Procedure: PERIPHERAL VASCULAR THROMBECTOMY;  Surgeon: Katha Cabal, MD;  Location: [place] [facility];  Service: Cardiovascular;  Laterality: Left;  . REMOVAL OF A DIALYSIS CATHETER Right 01/19/2019   Procedure: REMOVAL OF A DIALYSIS CATHETER;  Surgeon: Katha Cabal, MD;  Location: [facility];  Service: Vascular;  Laterality: Right;  . RENAL SHUNTS      Family History  Problem Relation Age of Onset  . Hypertension Sister     Allergies  Allergen Reactions  . Codeine Sulfate Nausea Only  . Contrast Media [Iodinated Diagnostic Agents] Hives  . Gadobutrol Hives  . Gadolinium Derivatives Hives  . Penicillins Hives and Itching    Has patient had a PCN reaction  causing immediate rash, facial/tongue/throat swelling, SOB or lightheadedness with hypotension: No Has patient had a PCN reaction causing severe rash involving mucus membranes or skin necrosis: No Has patient had a PCN reaction that required hospitalization: No Has patient had a PCN reaction occurring within the last 10 years: No If all of the above answers are "NO", then may proceed with Cephalosporin use.   . Statins Other (See Comments)    muscle pain    CBC Latest Ref Rng & Units 01/21/2019 01/20/2019 01/19/2019  WBC 4.0 - 10.5 K/uL 7.8 9.7 9.6  Hemoglobin 12.0 - 15.0 g/dL 7.2(L) 8.1(L) 8.4(L)  Hematocrit 36 - 46 % 23.1(L) 25.8(L) 26.9(L)  Platelets 150 - 400 K/uL 146(L) 198 193      CMP     Component Value Date/Time   NA 142 01/21/2019 0504   NA 135 (L) 03/29/2013 1409   K 5.2 (H) 01/21/2019 0504   K 4.5 03/29/2013 1409   CL 104 01/21/2019 0504   CL 97 (L) 03/29/2013 1409   CO2 25 01/21/2019 0504   CO2 27 03/29/2013 1409   GLUCOSE 102 (H) 01/21/2019 0504   GLUCOSE 97 03/29/2013 1409   BUN 72 (H) 01/21/2019 0504   BUN 48 (H) 03/29/2013 1409   CREATININE 12.69 (H) 01/21/2019 0504   CREATININE 12.06 (H) 03/29/2013 1409   CALCIUM 7.8 (L) 01/21/2019 0504   CALCIUM 8.8 03/29/2013 1409   PROT 7.1 09/26/2015 1517   ALBUMIN 3.7 09/26/2015 1517   AST 21 09/26/2015 1517   ALT 17 09/26/2015 1517   ALKPHOS 56 09/26/2015 1517   BILITOT 0.5 09/26/2015 1517   GFRNONAA 3 (L) 01/21/2019 0504   GFRNONAA 3 (L) 03/29/2013 1409   GFRAA 3 (L) 01/21/2019 0504   GFRAA 3 (L) 03/29/2013 1409     @COAG @  Radiology     Assessment & Plan:   1. Complication of vascular access for dialysis, sequela Recommend:  The patient is experiencing increasing problems with their dialysis access.  I again discussed our plan for venography and imaging of her central vein so we can determine whether a hero graft is feasible.  The risks, benefits and alternative therapies were reviewed in detail  with the patient.  All questions were answered.  The patient agrees to proceed with venogram.  2. Mixed hyperlipidemia Continue statin as ordered and reviewed, no changes at this time   3. Type 2 diabetes mellitus with chronic kidney disease on chronic dialysis, unspecified whether long term insulin use ([place]) Continue hypoglycemic medications as already ordered, these medications have been reviewed and there are no changes at this time.  Hgb A1C to be monitored as already arranged by primary service   4. Superior vena cava compression syndrome Due to possible superior vena cava syndrome, as discussed above we will have the patient undergo venogram for evaluation for possible right upper extremity hero graft.     Current Outpatient Medications on File Prior to Visit  Medication Sig Dispense Refill  . acetaminophen (TYLENOL) 500 MG tablet     . aspirin 81 MG tablet Take 81 mg by mouth daily.    . cinacalcet (SENSIPAR) 30 MG tablet Take 30 mg by mouth 3 (three) times a week. On dialysis days - Tues, Thurs, Sat    . diclofenac sodium (VOLTAREN) 1 % GEL APP 4 GRAMS EXT AA UP TO QID FOR PAIN. DO NOT EXCEED 16 GM FOR LE JOINT PER DAY    . DiphenhydrAMINE HCl, Sleep, 50 MG tablet Take 1 tablet by mouth 30 minutes before CT scan    . DUREZOL 0.05 % EMUL Place 1 drop into both eyes 2 (two) times daily.  0  . isosorbide mononitrate (IMDUR) 30 MG 24 hr tablet Take 30 mg by mouth daily.    [name] latanoprost (XALATAN) 0.005 % ophthalmic solution Place 1 drop into both eyes 2 (two) times daily.     . Methoxy PEG-Epoetin Beta (MIRCERA IJ) Mircera    . POLYETHYLENE GLYCOL 3350 PO as needed.     . pravastatin (PRAVACHOL) 20 MG tablet Take 20 mg by mouth daily.    . ranitidine (ZANTAC) 150 MG tablet Take 150 mg by mouth 2 (two) times daily.    . sevelamer carbonate (RENVELA) 800 MG tablet Take by mouth.    . Timolol Maleate 0.5 % (DAILY) SOLN Apply 1 drop to eye every morning.     No current  facility-administered medications on file prior to visit.    There are no Patient Instructions on file for this visit. No follow-ups on file.   Kris Hartmann, NP

## 2020-03-11 NOTE — H&P (View-Only) (Signed)
Subjective:    Patient ID: Kelli Nguyen, female    DOB: 10-Jul-1945, 74 y.o.   MRN: [id number] Chief Complaint  Patient presents with  . Follow-up    Vein mapping for new access    The patient returns to the office for follow up regarding problem with the dialysis access. Currently the patient is maintained via a permcath. The patient has had multiple failed upper extremity accesses.  The patient previously had a left upper extremity hero graft removed due to infection.  Since that time the patient has not had additional upper extremity access.  Currently her PermCath is working well.  The patient denies amaurosis fugax or recent TIA symptoms. There are no recent neurological changes noted. The patient denies claudication symptoms or rest pain symptoms. The patient denies history of DVT, PE or superficial thrombophlebitis. The patient denies recent episodes of angina or shortness of breath.   Today vein mapping indicates she has adequate access for a right brachial axillary AV graft.  However the patient also has superior vena cava compression syndrome, and the central veins were not able to be evaluated with ultrasound.    Review of Systems  Cardiovascular: Positive for leg swelling.  Neurological: Positive for weakness.  Hematological: Bruises/bleeds easily.  All other systems reviewed and are negative.      Objective:   Physical Exam Vitals reviewed.  HENT:     Head: Normocephalic.  Cardiovascular:     Rate and Rhythm: Normal rate.  Pulmonary:     Effort: Pulmonary effort is normal.  Neurological:     Mental Status: She is alert and oriented to person, place, and time.     Motor: Weakness present.     Gait: Gait abnormal.  Psychiatric:        Mood and Affect: Mood normal.        Behavior: Behavior normal.        Thought Content: Thought content normal.        Judgment: Judgment normal.     BP (!) 174/84   Pulse 68   Ht 4\' 11"  (1.499 m)   Wt 166 lb (75.3 kg)    BMI 33.53 kg/m   Past Medical History:  Diagnosis Date  . Anemia   . Chronic cough   . Chronic kidney disease   . Coronary artery disease   . Diabetes mellitus without complication ([place])   . Dialysis patient ([place])    T-TH-SAT  . GERD (gastroesophageal reflux disease)   . Hypertension   . Neuropathy    hands  . Wears dentures    partial upper and lower    Social History   Socioeconomic History  . Marital status: Widowed    Spouse name: Not on file  . Number of children: Not on file  . Years of education: Not on file  . Highest education level: Not on file  Occupational History  . Not on file  Tobacco Use  . Smoking status: Never Smoker  . Smokeless tobacco: Never Used  Vaping Use  . Vaping Use: Never used  Substance and Sexual Activity  . Alcohol use: No  . Drug use: No  . Sexual activity: Not on file  Other Topics Concern  . Not on file  Social History Narrative  . Not on file   Social Determinants of Health   Financial Resource Strain:   . Difficulty of Paying Living Expenses: Not on file  Food Insecurity:   . Worried About Running  Out of Food in the Last Year: Not on file  . Ran Out of Food in the Last Year: Not on file  Transportation Needs:   . Lack of Transportation (Medical): Not on file  . Lack of Transportation (Non-Medical): Not on file  Physical Activity:   . Days of Exercise per Week: Not on file  . Minutes of Exercise per Session: Not on file  Stress:   . Feeling of Stress : Not on file  Social Connections:   . Frequency of Communication with Friends and Family: Not on file  . Frequency of Social Gatherings with Friends and Family: Not on file  . Attends Religious Services: Not on file  . Active Member of Clubs or Organizations: Not on file  . Attends Archivist Meetings: Not on file  . Marital Status: Not on file  Intimate Partner Violence:   . Fear of Current or Ex-Partner: Not on file  . Emotionally Abused: Not on file  .  Physically Abused: Not on file  . Sexually Abused: Not on file    Past Surgical History:  Procedure Laterality Date  . A/V SHUNT INTERVENTION N/A 10/16/2016   Procedure: A/V Shunt Intervention;  Surgeon: Katha Cabal, MD;  Location: [place] [facility];  Service: Cardiovascular;  Laterality: N/A;  . A/V SHUNTOGRAM Left 10/16/2016   Procedure: A/V Shuntogram;  Surgeon: Katha Cabal, MD;  Location: [place] [facility];  Service: Cardiovascular;  Laterality: Left;  . ABDOMINAL HYSTERECTOMY    . CARPAL TUNNEL RELEASE Left 09/27/2015   Procedure: CARPAL TUNNEL RELEASE;  Surgeon: Leanor Kail, MD;  Location: [facility];  Service: Orthopedics;  Laterality: Left;  . CATARACT EXTRACTION W/PHACO Right 09/23/2016   Procedure: CATARACT EXTRACTION PHACO AND INTRAOCULAR LENS PLACEMENT (IOC) complicated diabetic right;  Surgeon: Ronnell Freshwater, MD;  Location: [place];  Service: Ophthalmology;  Laterality: Right;  Diabetic - diet controlled Potassium draw before surgery  . CATARACT EXTRACTION W/PHACO Left 01/20/2017   Procedure: CATARACT EXTRACTION PHACO AND INTRAOCULAR LENS PLACEMENT (IOC) LEFT DIABETIC;  Surgeon: Eulogio Bear, MD;  Location: [place];  Service: Ophthalmology;  Laterality: Left;  Diabetic - diet controlled Potassium draw prior to procedure  8:00 ARRIVAL  . COLONOSCOPY    . COLONOSCOPY WITH PROPOFOL N/A 11/28/2014   Procedure: COLONOSCOPY WITH PROPOFOL;  Surgeon: Manya Silvas, MD;  Location: [facility];  Service: Endoscopy;  Laterality: N/A;  . CORONARY ARTERY BYPASS GRAFT    . DIALYSIS/PERMA CATHETER INSERTION Left 01/21/2019   Procedure: DIALYSIS/PERMA CATHETER EXCHANGE;  Surgeon: Algernon Huxley, MD;  Location: [place] [facility];  Service: Cardiovascular;  Laterality: Left;  . INSERTION OF DIALYSIS CATHETER Left 01/19/2019   Procedure: INSERTION OF DIALYSIS CATHETER;  Surgeon: Katha Cabal, MD;  Location: [facility];  Service:  Vascular;  Laterality: Left;  . LIGATIONS OF HERO GRAFT Left 01/19/2019   Procedure: EXCISION LEFT HERO GRAFT;  Surgeon: Katha Cabal, MD;  Location: [facility];  Service: Vascular;  Laterality: Left;  . PERIPHERAL VASCULAR CATHETERIZATION Left 01/18/2015   Procedure: A/V Shuntogram/Fistulagram;  Surgeon: Katha Cabal, MD;  Location: [place] [facility];  Service: Cardiovascular;  Laterality: Left;  . PERIPHERAL VASCULAR CATHETERIZATION Left 01/18/2015   Procedure: A/V Shunt Intervention;  Surgeon: Katha Cabal, MD;  Location: [place] [facility];  Service: Cardiovascular;  Laterality: Left;  . PERIPHERAL VASCULAR CATHETERIZATION N/A 08/10/2015   Procedure: Thrombectomy;  Surgeon: Algernon Huxley, MD;  Location:  [place] [facility];  Service: Cardiovascular;  Laterality: N/A;  . PERIPHERAL VASCULAR CATHETERIZATION N/A 08/10/2015   Procedure: A/V Shuntogram/Fistulagram;  Surgeon: Algernon Huxley, MD;  Location: [place] [facility];  Service: Cardiovascular;  Laterality: N/A;  . PERIPHERAL VASCULAR CATHETERIZATION N/A 08/10/2015   Procedure: A/V Shunt Intervention;  Surgeon: Algernon Huxley, MD;  Location: [place] [facility];  Service: Cardiovascular;  Laterality: N/A;  . PERIPHERAL VASCULAR THROMBECTOMY Left 01/12/2019   Procedure: PERIPHERAL VASCULAR THROMBECTOMY;  Surgeon: Katha Cabal, MD;  Location: [place] [facility];  Service: Cardiovascular;  Laterality: Left;  . REMOVAL OF A DIALYSIS CATHETER Right 01/19/2019   Procedure: REMOVAL OF A DIALYSIS CATHETER;  Surgeon: Katha Cabal, MD;  Location: [facility];  Service: Vascular;  Laterality: Right;  . RENAL SHUNTS      Family History  Problem Relation Age of Onset  . Hypertension Sister     Allergies  Allergen Reactions  . Codeine Sulfate Nausea Only  . Contrast Media [Iodinated Diagnostic Agents] Hives  . Gadobutrol Hives  . Gadolinium Derivatives Hives  . Penicillins Hives and Itching    Has patient had a PCN reaction  causing immediate rash, facial/tongue/throat swelling, SOB or lightheadedness with hypotension: No Has patient had a PCN reaction causing severe rash involving mucus membranes or skin necrosis: No Has patient had a PCN reaction that required hospitalization: No Has patient had a PCN reaction occurring within the last 10 years: No If all of the above answers are "NO", then may proceed with Cephalosporin use.   . Statins Other (See Comments)    muscle pain    CBC Latest Ref Rng & Units 01/21/2019 01/20/2019 01/19/2019  WBC 4.0 - 10.5 K/uL 7.8 9.7 9.6  Hemoglobin 12.0 - 15.0 g/dL 7.2(L) 8.1(L) 8.4(L)  Hematocrit 36 - 46 % 23.1(L) 25.8(L) 26.9(L)  Platelets 150 - 400 K/uL 146(L) 198 193      CMP     Component Value Date/Time   NA 142 01/21/2019 0504   NA 135 (L) 03/29/2013 1409   K 5.2 (H) 01/21/2019 0504   K 4.5 03/29/2013 1409   CL 104 01/21/2019 0504   CL 97 (L) 03/29/2013 1409   CO2 25 01/21/2019 0504   CO2 27 03/29/2013 1409   GLUCOSE 102 (H) 01/21/2019 0504   GLUCOSE 97 03/29/2013 1409   BUN 72 (H) 01/21/2019 0504   BUN 48 (H) 03/29/2013 1409   CREATININE 12.69 (H) 01/21/2019 0504   CREATININE 12.06 (H) 03/29/2013 1409   CALCIUM 7.8 (L) 01/21/2019 0504   CALCIUM 8.8 03/29/2013 1409   PROT 7.1 09/26/2015 1517   ALBUMIN 3.7 09/26/2015 1517   AST 21 09/26/2015 1517   ALT 17 09/26/2015 1517   ALKPHOS 56 09/26/2015 1517   BILITOT 0.5 09/26/2015 1517   GFRNONAA 3 (L) 01/21/2019 0504   GFRNONAA 3 (L) 03/29/2013 1409   GFRAA 3 (L) 01/21/2019 0504   GFRAA 3 (L) 03/29/2013 1409     @COAG @  Radiology     Assessment & Plan:   1. Complication of vascular access for dialysis, sequela Recommend:  The patient is experiencing increasing problems with their dialysis access.  I again discussed our plan for venography and imaging of her central vein so we can determine whether a hero graft is feasible.  The risks, benefits and alternative therapies were reviewed in detail  with the patient.  All questions were answered.  The patient agrees to proceed with venogram.  2. Mixed hyperlipidemia Continue statin as ordered and reviewed, no changes at this time   3. Type 2 diabetes mellitus with chronic kidney disease on chronic dialysis, unspecified whether long term insulin use ([place]) Continue hypoglycemic medications as already ordered, these medications have been reviewed and there are no changes at this time.  Hgb A1C to be monitored as already arranged by primary service   4. Superior vena cava compression syndrome Due to possible superior vena cava syndrome, as discussed above we will have the patient undergo venogram for evaluation for possible right upper extremity hero graft.     Current Outpatient Medications on File Prior to Visit  Medication Sig Dispense Refill  . acetaminophen (TYLENOL) 500 MG tablet     . aspirin 81 MG tablet Take 81 mg by mouth daily.    . cinacalcet (SENSIPAR) 30 MG tablet Take 30 mg by mouth 3 (three) times a week. On dialysis days - Tues, Thurs, Sat    . diclofenac sodium (VOLTAREN) 1 % GEL APP 4 GRAMS EXT AA UP TO QID FOR PAIN. DO NOT EXCEED 16 GM FOR LE JOINT PER DAY    . DiphenhydrAMINE HCl, Sleep, 50 MG tablet Take 1 tablet by mouth 30 minutes before CT scan    . DUREZOL 0.05 % EMUL Place 1 drop into both eyes 2 (two) times daily.  0  . isosorbide mononitrate (IMDUR) 30 MG 24 hr tablet Take 30 mg by mouth daily.    [name] latanoprost (XALATAN) 0.005 % ophthalmic solution Place 1 drop into both eyes 2 (two) times daily.     . Methoxy PEG-Epoetin Beta (MIRCERA IJ) Mircera    . POLYETHYLENE GLYCOL 3350 PO as needed.     . pravastatin (PRAVACHOL) 20 MG tablet Take 20 mg by mouth daily.    . ranitidine (ZANTAC) 150 MG tablet Take 150 mg by mouth 2 (two) times daily.    . sevelamer carbonate (RENVELA) 800 MG tablet Take by mouth.    . Timolol Maleate 0.5 % (DAILY) SOLN Apply 1 drop to eye every morning.     No current  facility-administered medications on file prior to visit.    There are no Patient Instructions on file for this visit. No follow-ups on file.   Kris Hartmann, NP

## 2020-03-13 ENCOUNTER — Ambulatory Visit: Payer: PRIVATE HEALTH INSURANCE | Admitting: Podiatry

## 2020-03-16 ENCOUNTER — Other Ambulatory Visit: Payer: Self-pay

## 2020-03-16 ENCOUNTER — Other Ambulatory Visit
Admission: RE | Admit: 2020-03-16 | Discharge: 2020-03-16 | Disposition: A | Discharge status: Home / Self Care | Payer: Medicare Other | Source: Ambulatory Visit | Attending: Vascular Surgery | Admitting: Vascular Surgery

## 2020-03-16 DIAGNOSIS — Z01812 Encounter for preprocedural laboratory examination: Secondary | ICD-10-CM | POA: Diagnosis present

## 2020-03-16 DIAGNOSIS — Z20822 Contact with and (suspected) exposure to covid-19: Secondary | ICD-10-CM | POA: Insufficient documentation

## 2020-03-16 LAB — SARS CORONAVIRUS 2 (TAT 6-24 HRS): SARS Coronavirus 2: NEGATIVE

## 2020-03-20 ENCOUNTER — Encounter
Admission: RE | Disposition: A | Discharge status: Home / Self Care | Payer: Self-pay | Source: Home / Self Care | Attending: Vascular Surgery

## 2020-03-20 ENCOUNTER — Other Ambulatory Visit (INDEPENDENT_AMBULATORY_CARE_PROVIDER_SITE_OTHER): Payer: Self-pay | Admitting: Nurse Practitioner

## 2020-03-20 ENCOUNTER — Encounter: Payer: Self-pay | Admitting: Vascular Surgery

## 2020-03-20 ENCOUNTER — Other Ambulatory Visit: Payer: Self-pay

## 2020-03-20 ENCOUNTER — Ambulatory Visit
Admission: RE | Admit: 2020-03-20 | Discharge: 2020-03-20 | Disposition: A | Discharge status: Home / Self Care | Payer: Medicare Other | Attending: Vascular Surgery | Admitting: Vascular Surgery

## 2020-03-20 DIAGNOSIS — Z79899 Other long term (current) drug therapy: Secondary | ICD-10-CM | POA: Insufficient documentation

## 2020-03-20 DIAGNOSIS — Z7982 Long term (current) use of aspirin: Secondary | ICD-10-CM | POA: Insufficient documentation

## 2020-03-20 DIAGNOSIS — Z885 Allergy status to narcotic agent status: Secondary | ICD-10-CM | POA: Insufficient documentation

## 2020-03-20 DIAGNOSIS — Z91041 Radiographic dye allergy status: Secondary | ICD-10-CM | POA: Insufficient documentation

## 2020-03-20 DIAGNOSIS — Z888 Allergy status to other drugs, medicaments and biological substances status: Secondary | ICD-10-CM | POA: Diagnosis not present

## 2020-03-20 DIAGNOSIS — Z992 Dependence on renal dialysis: Secondary | ICD-10-CM | POA: Diagnosis not present

## 2020-03-20 DIAGNOSIS — I120 Hypertensive chronic kidney disease with stage 5 chronic kidney disease or end stage renal disease: Secondary | ICD-10-CM | POA: Diagnosis not present

## 2020-03-20 DIAGNOSIS — N186 End stage renal disease: Secondary | ICD-10-CM | POA: Diagnosis not present

## 2020-03-20 DIAGNOSIS — Z880 Allergy status to penicillin: Secondary | ICD-10-CM | POA: Diagnosis not present

## 2020-03-20 DIAGNOSIS — T82898A Other specified complication of vascular prosthetic devices, implants and grafts, initial encounter: Secondary | ICD-10-CM | POA: Diagnosis not present

## 2020-03-20 DIAGNOSIS — E782 Mixed hyperlipidemia: Secondary | ICD-10-CM | POA: Diagnosis not present

## 2020-03-20 DIAGNOSIS — E1122 Type 2 diabetes mellitus with diabetic chronic kidney disease: Secondary | ICD-10-CM | POA: Insufficient documentation

## 2020-03-20 HISTORY — PX: UPPER EXTREMITY VENOGRAPHY: CATH118272

## 2020-03-20 LAB — POTASSIUM (ARMC VASCULAR LAB ONLY): Potassium (ARMC vascular lab): 4.6 (ref 3.5–5.1)

## 2020-03-20 SURGERY — UPPER EXTREMITY VENOGRAPHY
Anesthesia: Moderate Sedation | Site: Arm Upper | Laterality: Right

## 2020-03-20 MED ORDER — HEPARIN SODIUM (PORCINE) 1000 UNIT/ML IJ SOLN
INTRAMUSCULAR | Status: DC | PRN
  Administered 2020-03-20: 3000 [IU] via INTRAVENOUS

## 2020-03-20 MED ORDER — MIDAZOLAM HCL 2 MG/2ML IJ SOLN
INTRAMUSCULAR | Status: DC | PRN
  Administered 2020-03-20: 2 mg via INTRAVENOUS

## 2020-03-20 MED ORDER — HEPARIN SODIUM (PORCINE) 1000 UNIT/ML IJ SOLN
INTRAMUSCULAR | Status: AC
  Filled 2020-03-20: qty 1

## 2020-03-20 MED ORDER — FAMOTIDINE 20 MG PO TABS
ORAL_TABLET | ORAL | Status: AC
  Filled 2020-03-20: qty 2

## 2020-03-20 MED ORDER — METHYLPREDNISOLONE SODIUM SUCC 125 MG IJ SOLR: 125.0000 mg | Freq: Once | INTRAMUSCULAR | Status: AC | PRN

## 2020-03-20 MED ORDER — FAMOTIDINE 20 MG PO TABS
ORAL_TABLET | ORAL | Status: AC
  Administered 2020-03-20: 40 mg via ORAL
  Filled 2020-03-20: qty 2

## 2020-03-20 MED ORDER — ONDANSETRON HCL 4 MG/2ML IJ SOLN: 4.0000 mg | Freq: Four times a day (QID) | INTRAMUSCULAR | Status: DC | PRN

## 2020-03-20 MED ORDER — SODIUM CHLORIDE 0.9 % IV SOLN: INTRAVENOUS | Status: DC

## 2020-03-20 MED ORDER — FAMOTIDINE 20 MG PO TABS: 40.0000 mg | ORAL_TABLET | Freq: Once | ORAL | Status: AC | PRN

## 2020-03-20 MED ORDER — METHYLPREDNISOLONE SODIUM SUCC 125 MG IJ SOLR
INTRAMUSCULAR | Status: AC
  Administered 2020-03-20: 125 mg via INTRAVENOUS
  Filled 2020-03-20: qty 2

## 2020-03-20 MED ORDER — HYDROMORPHONE HCL 1 MG/ML IJ SOLN: 1.0000 mg | Freq: Once | INTRAMUSCULAR | Status: DC | PRN

## 2020-03-20 MED ORDER — MIDAZOLAM HCL 5 MG/5ML IJ SOLN
INTRAMUSCULAR | Status: AC
  Filled 2020-03-20: qty 5

## 2020-03-20 MED ORDER — CLINDAMYCIN PHOSPHATE 300 MG/50ML IV SOLN
INTRAVENOUS | Status: AC
  Filled 2020-03-20: qty 50

## 2020-03-20 MED ORDER — DIPHENHYDRAMINE HCL 50 MG/ML IJ SOLN
INTRAMUSCULAR | Status: AC
  Filled 2020-03-20: qty 1

## 2020-03-20 MED ORDER — DIPHENHYDRAMINE HCL 50 MG/ML IJ SOLN
50.0000 mg | Freq: Once | INTRAMUSCULAR | Status: AC | PRN
  Administered 2020-03-20: 50 mg via INTRAVENOUS

## 2020-03-20 MED ORDER — FENTANYL CITRATE (PF) 100 MCG/2ML IJ SOLN
INTRAMUSCULAR | Status: DC | PRN
  Administered 2020-03-20: 50 ug via INTRAVENOUS

## 2020-03-20 MED ORDER — FENTANYL CITRATE (PF) 100 MCG/2ML IJ SOLN
INTRAMUSCULAR | Status: AC
  Filled 2020-03-20: qty 2

## 2020-03-20 MED ORDER — CLINDAMYCIN PHOSPHATE 300 MG/50ML IV SOLN: 300.0000 mg | Freq: Once | INTRAVENOUS | Status: DC

## 2020-03-20 MED ORDER — MIDAZOLAM HCL 2 MG/ML PO SYRP: 8.0000 mg | ORAL_SOLUTION | Freq: Once | ORAL | Status: DC | PRN

## 2020-03-20 MED ORDER — IODIXANOL 320 MG/ML IV SOLN
INTRAVENOUS | Status: DC | PRN
  Administered 2020-03-20: 20 mL

## 2020-03-20 SURGICAL SUPPLY — 13 items
BALLN LUTONIX AV 8X60X75 (BALLOONS) ×3
BALLOON LUTONIX AV 8X60X75 (BALLOONS) ×1 IMPLANT
CANNULA 5F STIFF (CANNULA) ×3 IMPLANT
CATH KUMPE SOFT-VU 5FR 65 (CATHETERS) ×3 IMPLANT
DRAPE BRACHIAL (DRAPES) ×3 IMPLANT
GLIDEWIRE ADV .035X180CM (WIRE) ×3 IMPLANT
KIT ENCORE 26 ADVANTAGE (KITS) ×3 IMPLANT
PACK ANGIOGRAPHY (CUSTOM PROCEDURE TRAY) ×3 IMPLANT
SHEATH BRITE TIP 6FRX5.5 (SHEATH) ×3 IMPLANT
SUT MNCRL 4-0 (SUTURE) ×2
SUT MNCRL 4-0 27XMFL (SUTURE) ×1
SUTURE MNCRL 4-0 27XMF (SUTURE) ×1 IMPLANT
TOWEL OR 17X26 4PK STRL BLUE (TOWEL DISPOSABLE) ×3 IMPLANT

## 2020-03-20 NOTE — Discharge Instructions (Signed)
Venogram A venogram, or venography, is a procedure that uses an X-ray and dye (contrast) to examine how well the veins work and how blood flows through them. Contrast helps the veins show up on X-rays. A venogram may be done:  To evaluate abnormalities in the vein.  To identify clots within veins, such as deep vein thrombosis (DVT).  To map out the veins that might be needed for another procedure. Tell a health care provider about:  Any allergies you have, especially to medicines, shellfish, iodine, and contrast.  All medicines you are taking, including vitamins, herbs, eye drops, creams, and over-the-counter medicines.  Any problems you or family members have had with anesthetic medicines.  Any blood disorders you have.  Any surgeries you have had and any complications that occurred.  Any medical conditions you have.  Whether you are pregnant, may be pregnant, or are breastfeeding.  Any history of smoking or tobacco use. What are the risks? Generally, this is a safe procedure. However, problems may occur, including:  Infection.  Bleeding.  Blood clots.  Allergic reaction to medicines or contrast.  Damage to other structures or organs.  Kidney problems.  Increased risk of cancer. Being exposed to too much radiation over a lifetime can increase the risk of cancer. The risk is small. What happens before the procedure? Medicines Ask your health care provider about:  Changing or stopping your regular medicines. This is especially important if you are taking diabetes medicines or blood thinners.  Taking medicines such as aspirin and ibuprofen. These medicines can thin your blood. Do not take these medicines unless your health care provider tells you to take them.  Taking over-the-counter medicines, vitamins, herbs, and supplements. General instructions  Follow instructions from your health care provider about eating or drinking restrictions.  You may have blood tests  to check how well your kidneys and liver are working and how well your blood can clot.  Plan to have someone take you home from the hospital or clinic. What happens during the procedure?   An IV will be inserted into one of your veins.  You may be given a medicine to help you relax (sedative).  You will lie down on an X-ray table. The table may be tilted in different directions during the procedure to help the contrast move throughout your body. Safety straps will keep you secure if the table is tilted.  If veins in your arm or leg will be examined, a band may be wrapped around that arm or leg to keep the veins full of blood. This may cause your arm or leg to feel numb.  The contrast will be injected into your IV. You may have a hot, flushed feeling as it moves throughout your body. You may also have a metallic taste in your mouth. Both of these sensations will go away after the test is complete.  You may be asked to lie in different positions or place your legs or arms in different positions.  At the end of the procedure, you may be given IV fluids to help wash or flush the contrast out of your veins.  The IV will be removed, and pressure will be applied to the IV site to prevent bleeding. A bandage (dressing) may be applied to the IV site. The exact procedure may vary among health care providers and hospitals. What can I expect after the procedure?  Your blood pressure, heart rate, breathing rate, and blood oxygen level will be monitored until you   leave the hospital or clinic.  You may be given something to eat and drink.  You may have bruising or mild discomfort in the area where the IV was inserted. Follow these instructions at home: Eating and drinking   Follow instructions from your health care provider about eating or drinking restrictions.  Drink a lot of water for the first several days after the procedure, as directed by your health care provider. This helps to flush the  contrast out of your body. Activity  Rest as told by your health care provider.  Return to your normal activities as told by your health care provider. Ask your health care provider what activities are safe for you.  If you were given a sedative during your procedure, do not drive for 24 hours or until your health care provider approves. General instructions  Check your IV insertion area every day for signs of infection. Check for: ? Redness, swelling, or pain. ? Fluid or blood. ? Warmth. ? Pus or a bad smell.  Take over-the-counter and prescription medicines only as told by your health care provider.  Keep all follow-up visits as told by your health care provider. This is important. Contact a health care provider if:  Your skin becomes itchy or you develop a rash or hives.  You have a fever that does not get better with medicine.  You feel nauseous or you vomit.  You have redness, swelling, or pain around the insertion site.  You have fluid or blood coming from the insertion site.  Your insertion area feels warm to the touch.  You have pus or a bad smell coming from the insertion site. Get help right away if you:  Have shortness of breath or difficulty breathing.  Develop chest pain.  Faint.  Feel very dizzy. These symptoms may represent a serious problem that is an emergency. Do not wait to see if the symptoms will go away. Get medical help right away. Call your local emergency services (911 in the [place] Do not drive yourself to the hospital. Summary  A venogram, or venography, is a procedure that uses an X-ray and contrast dye to check how well the veins work and how blood flows through them.  An IV will be inserted into one of your veins in order to inject the contrast.  During the exam, you will lie on an X-ray table. The table may be tilted in different directions during the procedure to help the contrast move throughout your body. Safety straps will keep you  secure.  After the procedure, you will need to drink a lot of water to help wash or flush the contrast out of your body. This information is not intended to replace advice given to you by your health care provider. Make sure you discuss any questions you have with your health care provider. Document Revised: 11/28/2018 Document Reviewed: 11/28/2018 Elsevier Patient Education  2020 [employer]  

## 2020-03-20 NOTE — Interval H&P Note (Signed)
History and Physical Interval Note:  03/20/2020 9:23 AM  Kelli Nguyen  has presented today for surgery, with the diagnosis of RT upper extremity venogram    End Stage Renal Covid  Nov 11.  The various methods of treatment have been discussed with the patient and family. After consideration of risks, benefits and other options for treatment, the patient has consented to  Procedure(s): UPPER EXTREMITY VENOGRAPHY (Right) as a surgical intervention.  The patient's history has been reviewed, patient examined, no change in status, stable for surgery.  I have reviewed the patient's chart and labs.  Questions were answered to the patient's satisfaction.     Leotis Pain

## 2020-03-20 NOTE — Op Note (Signed)
[facility]   OPERATIVE NOTE  DATE: 03/20/2020  PRE-OPERATIVE DIAGNOSIS: 1. ESRD 2. Difficult dialysis access 3.  Failed left arm hero graft   POST-OPERATIVE DIAGNOSIS: same  PROCEDURE: 1.   Ultrasound guidance for vascular access to right brachial vein 2.   Catheter placement into right jugular vein and superior vena cava from right brachial approach 3.   Right upper extremity venogram, right jugular venogram, and superior venacavogram 4.   Percutaneous transluminal angioplasty of the right innominate vein with 8 mm diameter by 6 cm length Lutonix drug-coated angioplasty balloon  SURGEON: Leotis Pain, MD  ASSISTANT(S): None  ANESTHESIA: Local with moderate conscious sedation for approximately 21 minutes using 2 mg of Versed and 50 mcg of Fentanyl  ESTIMATED BLOOD LOSS: 2 cc  FINDING(S): 1.  A remaining brachial vein was present in the upper arm although it was fairly small.  The right subclavian vein was patent, but the right innominate vein demonstrated 70 to 75% stenosis.  The SVC then normalized around the existing left jugular PermCath.  SPECIMEN(S):  None  INDICATIONS:   Patient is a 74 y.o.female who presents with a difficult dialysis access situation. The patient has a history of multiple access procedures, but now needs a new dialysis access. Upper extremity venograms are being performed to evaluate the adequacy of the upper extremities for an AV fistula or AV graft. Risks and benefits were discussed and informed consent was obtained.   DESCRIPTION: After obtaining full informed written consent, the patient was brought back to the vascular suite and placed in supine position with their arms extended bilaterally. Moderate conscious sedation was administered during a face to face encounter with the patient throughout the procedure with my supervision of the RN administering medicines and monitoring the patient's vital signs, pulse oximetry, telemetry  and mental status throughout from the start of the procedure until the patient was taken to the recovery room.  After obtaining adequate anesthesia, the patient was prepped and draped in the standard fashion. I started on the right side. The right brachial vein was identified with ultrasound and found to be patent although small. It was then accessed under direct ultrasound guidance without difficulty with a micropuncture needle and a permanent image was recorded. A micropuncture wire and sheath were then placed. Imaging initially was performed through the micropuncture sheath which showed A remaining brachial vein was present in the upper arm although it was fairly small.  The right subclavian vein was patent, but the right innominate vein demonstrated 70 to 75% stenosis.  The SVC then normalized around the existing left jugular PermCath although image quality was not ideal centrally.  I then upsized to a 6 [place] sheath and gave 3000 units of intravenous heparin.  A Kumpe catheter and advantage were used and initially I passed this down in the superior vena cava where imaging was shown that this was patent around the catheter.  I then flipped it up into the right jugular vein about a centimeter above the clavicle.  This appeared to be patent although getting past the valve in the distal neck was difficult and I could not advance further up to evaluate the jugular vein further up in the neck.  I then put the wire down into the IVC through the atrium and proceeded with treatment of the innominate stenosis.  An 8 mm diameter by 6 cm length Lutonix drug-coated angioplasty balloon was inflated atmospheres for 1 minute.  Completion imaging showed about a 30%  residual stenosis that did not appear to be flow-limiting.  At this point we could likely put a hero graft and from the right if she would prefer and we will see her back to discuss options.  At this point, I elected to terminate the procedure. The sheath was  removed and pressure were held at the access site in the right upper extremity. The patient was awakened from anesthesia and taken to the recovery room in stable condition.  COMPLICATIONS: None  CONDITION: Stable

## 2020-04-03 ENCOUNTER — Encounter (INDEPENDENT_AMBULATORY_CARE_PROVIDER_SITE_OTHER): Payer: Self-pay | Admitting: Nurse Practitioner

## 2020-04-03 ENCOUNTER — Other Ambulatory Visit: Payer: Self-pay

## 2020-04-03 ENCOUNTER — Ambulatory Visit (INDEPENDENT_AMBULATORY_CARE_PROVIDER_SITE_OTHER): Payer: Medicare Other | Admitting: Nurse Practitioner

## 2020-04-03 VITALS — BP 154/65 | HR 68 | Resp 16 | Wt 169.8 lb

## 2020-04-03 DIAGNOSIS — E1159 Type 2 diabetes mellitus with other circulatory complications: Secondary | ICD-10-CM | POA: Diagnosis not present

## 2020-04-03 DIAGNOSIS — N186 End stage renal disease: Secondary | ICD-10-CM | POA: Diagnosis not present

## 2020-04-03 DIAGNOSIS — E782 Mixed hyperlipidemia: Secondary | ICD-10-CM | POA: Diagnosis not present

## 2020-04-09 ENCOUNTER — Encounter (INDEPENDENT_AMBULATORY_CARE_PROVIDER_SITE_OTHER): Payer: Self-pay | Admitting: Nurse Practitioner

## 2020-04-09 NOTE — Progress Notes (Signed)
Subjective:    Patient ID: Kelli Nguyen, female    DOB: [birth year], 74 y.o.   MRN: [id number] Chief Complaint  Patient presents with  . Follow-up    [facility] 2wk post ue venography discuss right arm hero placement    Kelli Nguyen is a 74 year old female that presents today for discussion about placement of a right upper extremity hero graft.  On 03/20/2020 the patient underwent a right upper extremity venogram which included:  PROCEDURE: 1.   Ultrasound guidance for vascular access to right brachial vein 2.   Catheter placement into right jugular vein and superior vena cava from right brachial approach 3.   Right upper extremity venogram, right jugular venogram, and superior venacavogram 4.   Percutaneous transluminal angioplasty of the right innominate vein with 8 mm diameter by 6 cm length Lutonix drug-coated angioplasty balloon   Since that time the patient has recently been having extensive hip pain and it was found that her prosthetic has evidence of infection.  She recently visited infectious disease to determine a course of treatment.  She denies any fever, chills, nausea, vomiting or diarrhea.  She denies any signs symptoms of systemic infection.  She is currently receiving IV antibiotics during hemodialysis.  She notes that since beginning her antibiotics her hip pain has decreased.   Review of Systems     Objective:   Physical Exam  BP (!) 154/65 (BP Location: Right Arm)   Pulse 68   Resp 16   Wt 169 lb 12.8 oz (77 kg)   BMI 34.30 kg/m   Past Medical History:  Diagnosis Date  . Anemia   . Chronic cough   . Chronic kidney disease   . Coronary artery disease   . Diabetes mellitus without complication ([place])   . Dialysis patient ([place])    T-TH-SAT  . GERD (gastroesophageal reflux disease)   . Hypertension   . Neuropathy    hands  . Wears dentures    partial upper and lower    Social History   Socioeconomic History  . Marital status: Widowed    Spouse  name: Not on file  . Number of children: Not on file  . Years of education: Not on file  . Highest education level: Not on file  Occupational History  . Not on file  Tobacco Use  . Smoking status: Never Smoker  . Smokeless tobacco: Never Used  Vaping Use  . Vaping Use: Never used  Substance and Sexual Activity  . Alcohol use: No  . Drug use: No  . Sexual activity: Not on file  Other Topics Concern  . Not on file  Social History Narrative  . Not on file   Social Determinants of Health   Financial Resource Strain:   . Difficulty of Paying Living Expenses: Not on file  Food Insecurity:   . Worried About Charity fundraiser in the Last Year: Not on file  . Ran Out of Food in the Last Year: Not on file  Transportation Needs:   . Lack of Transportation (Medical): Not on file  . Lack of Transportation (Non-Medical): Not on file  Physical Activity:   . Days of Exercise per Week: Not on file  . Minutes of Exercise per Session: Not on file  Stress:   . Feeling of Stress : Not on file  Social Connections:   . Frequency of Communication with Friends and Family: Not on file  . Frequency of Social Gatherings with Friends and  Family: Not on file  . Attends Religious Services: Not on file  . Active Member of Clubs or Organizations: Not on file  . Attends Archivist Meetings: Not on file  . Marital Status: Not on file  Intimate Partner Violence:   . Fear of Current or Ex-Partner: Not on file  . Emotionally Abused: Not on file  . Physically Abused: Not on file  . Sexually Abused: Not on file    Past Surgical History:  Procedure Laterality Date  . A/V SHUNT INTERVENTION N/A 10/16/2016   Procedure: A/V Shunt Intervention;  Surgeon: Katha Cabal, MD;  Location: [place] [facility];  Service: Cardiovascular;  Laterality: N/A;  . A/V SHUNTOGRAM Left 10/16/2016   Procedure: A/V Shuntogram;  Surgeon: Katha Cabal, MD;  Location: [place] [facility];  Service:  Cardiovascular;  Laterality: Left;  . ABDOMINAL HYSTERECTOMY    . CARPAL TUNNEL RELEASE Left 09/27/2015   Procedure: CARPAL TUNNEL RELEASE;  Surgeon: Leanor Kail, MD;  Location: [facility];  Service: Orthopedics;  Laterality: Left;  . CATARACT EXTRACTION W/PHACO Right 09/23/2016   Procedure: CATARACT EXTRACTION PHACO AND INTRAOCULAR LENS PLACEMENT (IOC) complicated diabetic right;  Surgeon: Ronnell Freshwater, MD;  Location: [place];  Service: Ophthalmology;  Laterality: Right;  Diabetic - diet controlled Potassium draw before surgery  . CATARACT EXTRACTION W/PHACO Left 01/20/2017   Procedure: CATARACT EXTRACTION PHACO AND INTRAOCULAR LENS PLACEMENT (IOC) LEFT DIABETIC;  Surgeon: Eulogio Bear, MD;  Location: [place];  Service: Ophthalmology;  Laterality: Left;  Diabetic - diet controlled Potassium draw prior to procedure  8:00 ARRIVAL  . COLONOSCOPY    . COLONOSCOPY WITH PROPOFOL N/A 11/28/2014   Procedure: COLONOSCOPY WITH PROPOFOL;  Surgeon: Manya Silvas, MD;  Location: [facility];  Service: Endoscopy;  Laterality: N/A;  . CORONARY ARTERY BYPASS GRAFT    . DIALYSIS/PERMA CATHETER INSERTION Left 01/21/2019   Procedure: DIALYSIS/PERMA CATHETER EXCHANGE;  Surgeon: Algernon Huxley, MD;  Location: [place] [facility];  Service: Cardiovascular;  Laterality: Left;  . INSERTION OF DIALYSIS CATHETER Left 01/19/2019   Procedure: INSERTION OF DIALYSIS CATHETER;  Surgeon: Katha Cabal, MD;  Location: [facility];  Service: Vascular;  Laterality: Left;  . LIGATIONS OF HERO GRAFT Left 01/19/2019   Procedure: EXCISION LEFT HERO GRAFT;  Surgeon: Katha Cabal, MD;  Location: [facility];  Service: Vascular;  Laterality: Left;  . PERIPHERAL VASCULAR CATHETERIZATION Left 01/18/2015   Procedure: A/V Shuntogram/Fistulagram;  Surgeon: Katha Cabal, MD;  Location: [place] [facility];  Service: Cardiovascular;  Laterality: Left;  . PERIPHERAL VASCULAR CATHETERIZATION  Left 01/18/2015   Procedure: A/V Shunt Intervention;  Surgeon: Katha Cabal, MD;  Location: [place] [facility];  Service: Cardiovascular;  Laterality: Left;  . PERIPHERAL VASCULAR CATHETERIZATION N/A 08/10/2015   Procedure: Thrombectomy;  Surgeon: Algernon Huxley, MD;  Location: [place] [facility];  Service: Cardiovascular;  Laterality: N/A;  . PERIPHERAL VASCULAR CATHETERIZATION N/A 08/10/2015   Procedure: A/V Shuntogram/Fistulagram;  Surgeon: Algernon Huxley, MD;  Location: [place] [facility];  Service: Cardiovascular;  Laterality: N/A;  . PERIPHERAL VASCULAR CATHETERIZATION N/A 08/10/2015   Procedure: A/V Shunt Intervention;  Surgeon: Algernon Huxley, MD;  Location: [place] [facility];  Service: Cardiovascular;  Laterality: N/A;  . PERIPHERAL VASCULAR THROMBECTOMY Left 01/12/2019   Procedure: PERIPHERAL VASCULAR THROMBECTOMY;  Surgeon: Katha Cabal, MD;  Location: [place] [facility];  Service: Cardiovascular;  Laterality: Left;  . REMOVAL OF  A DIALYSIS CATHETER Right 01/19/2019   Procedure: REMOVAL OF A DIALYSIS CATHETER;  Surgeon: Katha Cabal, MD;  Location: [facility];  Service: Vascular;  Laterality: Right;  . RENAL SHUNTS    . UPPER EXTREMITY VENOGRAPHY Right 03/20/2020   Procedure: UPPER EXTREMITY VENOGRAPHY;  Surgeon: Algernon Huxley, MD;  Location: [place] [facility];  Service: Cardiovascular;  Laterality: Right;    Family History  Problem Relation Age of Onset  . Hypertension Sister     Allergies  Allergen Reactions  . Penicillins Hives, Itching and Swelling    Has patient had a PCN reaction causing immediate rash, facial/tongue/throat swelling, SOB or lightheadedness with hypotension: No Has patient had a PCN reaction causing severe rash involving mucus membranes or skin necrosis: No Has patient had a PCN reaction that required hospitalization: No Has patient had a PCN reaction occurring within the last 10 years: No If all of the above answers are "NO", then may proceed  with Cephalosporin use.  Penicillin allergy assessment completed on 03/22/20. Patient has received and tolerated cepholosporins incuding cefepime, ceftriaxone.   . Codeine Sulfate Nausea Only  . Contrast Media [Iodinated Diagnostic Agents] Hives  . Gadobutrol Hives  . Gadolinium Derivatives Hives  . Statins Other (See Comments)    muscle pain    CBC Latest Ref Rng & Units 01/21/2019 01/20/2019 01/19/2019  WBC 4.0 - 10.5 K/uL 7.8 9.7 9.6  Hemoglobin 12.0 - 15.0 g/dL 7.2(L) 8.1(L) 8.4(L)  Hematocrit 36 - 46 % 23.1(L) 25.8(L) 26.9(L)  Platelets 150 - 400 K/uL 146(L) 198 193      CMP     Component Value Date/Time   NA 142 01/21/2019 0504   NA 135 (L) 03/29/2013 1409   K 5.2 (H) 01/21/2019 0504   K 4.5 03/29/2013 1409   CL 104 01/21/2019 0504   CL 97 (L) 03/29/2013 1409   CO2 25 01/21/2019 0504   CO2 27 03/29/2013 1409   GLUCOSE 102 (H) 01/21/2019 0504   GLUCOSE 97 03/29/2013 1409   BUN 72 (H) 01/21/2019 0504   BUN 48 (H) 03/29/2013 1409   CREATININE 12.69 (H) 01/21/2019 0504   CREATININE 12.06 (H) 03/29/2013 1409   CALCIUM 7.8 (L) 01/21/2019 0504   CALCIUM 8.8 03/29/2013 1409   PROT 7.1 09/26/2015 1517   ALBUMIN 3.7 09/26/2015 1517   AST 21 09/26/2015 1517   ALT 17 09/26/2015 1517   ALKPHOS 56 09/26/2015 1517   BILITOT 0.5 09/26/2015 1517   GFRNONAA 3 (L) 01/21/2019 0504   GFRNONAA 3 (L) 03/29/2013 1409   GFRAA 3 (L) 01/21/2019 0504   GFRAA 3 (L) 03/29/2013 1409     No results found.     Assessment & Plan:   1. ESRD (end stage renal disease) ([place]) Unfortunately the most recent development of infection of the hip graft will cause [place] to delay placement of a hero graft.  Placing her graft while the patient has an active infection drastically increases the likelihood that the graft itself will become infected and need to be removed.  Patient is currently working with infectious disease.  Per their most recent office visit notes they will plan on having her receive IV  antibiotics during hemodialysis for the next 6 weeks.  This is estimated to be ending around the beginning of the year.  Once that is completed the patient will be transitioned to oral suppression therapy.  At this time we will plan on having the patient return in 4 months.  If the  patient is able to undergo suppression therapy without any further instances of systemic infection, we may be able to consider placing her hero graft at that time.  In the interim, the patient will continue to utilize her dialysis catheter, which can be exchanged if necessary.  2. Type 2 diabetes mellitus with vascular disease ([place]) Continue hypoglycemic medications as already ordered, these medications have been reviewed and there are no changes at this time.  Hgb A1C to be monitored as already arranged by primary service   3. Mixed hyperlipidemia Continue statin as ordered and reviewed, no changes at this time    Current Outpatient Medications on File Prior to Visit  Medication Sig Dispense Refill  . acetaminophen (TYLENOL) 500 MG tablet     . aspirin 81 MG tablet Take 81 mg by mouth daily.    . cinacalcet (SENSIPAR) 30 MG tablet Take 30 mg by mouth 3 (three) times a week. On dialysis days - Tues, Thurs, Sat    . diclofenac sodium (VOLTAREN) 1 % GEL APP 4 GRAMS EXT AA UP TO QID FOR PAIN. DO NOT EXCEED 16 GM FOR LE JOINT PER DAY    . DiphenhydrAMINE HCl, Sleep, 50 MG tablet Take 1 tablet by mouth 30 minutes before CT scan    . DUREZOL 0.05 % EMUL Place 1 drop into both eyes 2 (two) times daily.  0  . latanoprost (XALATAN) 0.005 % ophthalmic solution Place 1 drop into both eyes 2 (two) times daily.     . Methoxy PEG-Epoetin Beta (MIRCERA IJ) Mircera    . POLYETHYLENE GLYCOL 3350 PO as needed.     . pravastatin (PRAVACHOL) 20 MG tablet Take 20 mg by mouth daily.    . sevelamer carbonate (RENVELA) 800 MG tablet Take by mouth.    . isosorbide mononitrate (IMDUR) 30 MG 24 hr tablet Take 30 mg by mouth daily. (Patient  not taking: Reported on 03/20/2020)    . ranitidine (ZANTAC) 150 MG tablet Take 150 mg by mouth 2 (two) times daily. (Patient not taking: Reported on 03/20/2020)    . Timolol Maleate 0.5 % (DAILY) SOLN Apply 1 drop to eye every morning. (Patient not taking: Reported on 03/20/2020)     No current facility-administered medications on file prior to visit.    There are no Patient Instructions on file for this visit. No follow-ups on file.   Kris Hartmann, NP

## 2020-06-26 ENCOUNTER — Ambulatory Visit (INDEPENDENT_AMBULATORY_CARE_PROVIDER_SITE_OTHER): Payer: PRIVATE HEALTH INSURANCE | Admitting: Vascular Surgery

## 2020-07-16 NOTE — Progress Notes (Signed)
MRN : [id number]  Kelli Nguyen is a 75 y.o. (Sep 11, 1945) female who presents with chief complaint of No chief complaint on file. [name]  History of Present Illness:  Patient returns the office for follow-up care status post excision of an infected left arm hero graft on January 19, 2019. She did require exchange of her left IJ tunneled catheter 2 days later secondary to malfunction of the catheter. A 27 tip to cuff catheter was placed at that time. She reports her catheter is been working well.  Most recently she had a venogram of the right in 03/2020: Right upper extremity venogram, right jugular venogram, and superior venacavogram with percutaneous transluminal angioplasty of the right innominate vein with 8 mm diameter by 6 cm length Lutonix drug-coated angioplasty balloon  The patient returns to the office for followup she continues to have significant left lower extremity pain apparently she is undergoing injections for her hip. There have been no interval changes in lower extremity symptoms. No interval shortening of the patient's claudication distance or development of rest pain symptoms. No new ulcers or wounds have occurred since the last visit.  There have been no significant changes to the patient's overall health care.  The patient denies amaurosis fugax or recent TIA symptoms. There are no recent neurological changes noted. The patient denies history of DVT, PE or superficial thrombophlebitis. The patient denies recent episodes of angina or shortness of breath.   Previous ABI Rt=NCand Lt=[place](biphasic signals)  No outpatient medications have been marked as taking for the 07/17/20 encounter (Appointment) with Delana Meyer, Dolores Lory, MD.    Past Medical History:  Diagnosis Date  . Anemia   . Chronic cough   . Chronic kidney disease   . Coronary artery disease   . Diabetes mellitus without complication ([place])   . Dialysis patient ([place])    T-TH-SAT  . GERD  (gastroesophageal reflux disease)   . Hypertension   . Neuropathy    hands  . Wears dentures    partial upper and lower    Past Surgical History:  Procedure Laterality Date  . A/V SHUNT INTERVENTION N/A 10/16/2016   Procedure: A/V Shunt Intervention;  Surgeon: Katha Cabal, MD;  Location: [place] [facility];  Service: Cardiovascular;  Laterality: N/A;  . A/V SHUNTOGRAM Left 10/16/2016   Procedure: A/V Shuntogram;  Surgeon: Katha Cabal, MD;  Location: [place] [facility];  Service: Cardiovascular;  Laterality: Left;  . ABDOMINAL HYSTERECTOMY    . CARPAL TUNNEL RELEASE Left 09/27/2015   Procedure: CARPAL TUNNEL RELEASE;  Surgeon: Leanor Kail, MD;  Location: [facility];  Service: Orthopedics;  Laterality: Left;  . CATARACT EXTRACTION W/PHACO Right 09/23/2016   Procedure: CATARACT EXTRACTION PHACO AND INTRAOCULAR LENS PLACEMENT (IOC) complicated diabetic right;  Surgeon: Ronnell Freshwater, MD;  Location: [place];  Service: Ophthalmology;  Laterality: Right;  Diabetic - diet controlled Potassium draw before surgery  . CATARACT EXTRACTION W/PHACO Left 01/20/2017   Procedure: CATARACT EXTRACTION PHACO AND INTRAOCULAR LENS PLACEMENT (IOC) LEFT DIABETIC;  Surgeon: Eulogio Bear, MD;  Location: [place];  Service: Ophthalmology;  Laterality: Left;  Diabetic - diet controlled Potassium draw prior to procedure  8:00 ARRIVAL  . COLONOSCOPY    . COLONOSCOPY WITH PROPOFOL N/A 11/28/2014   Procedure: COLONOSCOPY WITH PROPOFOL;  Surgeon: Manya Silvas, MD;  [facility];  Service: Endoscopy;  Laterality: N/A;  . CORONARY ARTERY BYPASS GRAFT    . DIALYSIS/PERMA CATHETER INSERTION Left 01/21/2019  Procedure: DIALYSIS/PERMA CATHETER EXCHANGE;  Surgeon: Algernon Huxley, MD;  Location: [place] [facility];  Service: Cardiovascular;  Laterality: Left;  . INSERTION OF DIALYSIS CATHETER Left 01/19/2019   Procedure: INSERTION OF DIALYSIS CATHETER;   Surgeon: Katha Cabal, MD;  Location: [facility];  Service: Vascular;  Laterality: Left;  . LIGATIONS OF HERO GRAFT Left 01/19/2019   Procedure: EXCISION LEFT HERO GRAFT;  Surgeon: Katha Cabal, MD;  Location: [facility];  Service: Vascular;  Laterality: Left;  . PERIPHERAL VASCULAR CATHETERIZATION Left 01/18/2015   Procedure: A/V Shuntogram/Fistulagram;  Surgeon: Katha Cabal, MD;  Location: [place] [facility];  Service: Cardiovascular;  Laterality: Left;  . PERIPHERAL VASCULAR CATHETERIZATION Left 01/18/2015   Procedure: A/V Shunt Intervention;  Surgeon: Katha Cabal, MD;  Location: [place] [facility];  Service: Cardiovascular;  Laterality: Left;  . PERIPHERAL VASCULAR CATHETERIZATION N/A 08/10/2015   Procedure: Thrombectomy;  Surgeon: Algernon Huxley, MD;  Location: [place] [facility];  Service: Cardiovascular;  Laterality: N/A;  . PERIPHERAL VASCULAR CATHETERIZATION N/A 08/10/2015   Procedure: A/V Shuntogram/Fistulagram;  Surgeon: Algernon Huxley, MD;  Location: [place] [facility];  Service: Cardiovascular;  Laterality: N/A;  . PERIPHERAL VASCULAR CATHETERIZATION N/A 08/10/2015   Procedure: A/V Shunt Intervention;  Surgeon: Algernon Huxley, MD;  Location: [place] [facility];  Service: Cardiovascular;  Laterality: N/A;  . PERIPHERAL VASCULAR THROMBECTOMY Left 01/12/2019   Procedure: PERIPHERAL VASCULAR THROMBECTOMY;  Surgeon: Katha Cabal, MD;  Location: [place] [facility];  Service: Cardiovascular;  Laterality: Left;  . REMOVAL OF A DIALYSIS CATHETER Right 01/19/2019   Procedure: REMOVAL OF A DIALYSIS CATHETER;  Surgeon: Katha Cabal, MD;  Location: [facility];  Service: Vascular;  Laterality: Right;  . RENAL SHUNTS    . UPPER EXTREMITY VENOGRAPHY Right 03/20/2020   Procedure: UPPER EXTREMITY VENOGRAPHY;  Surgeon: Algernon Huxley, MD;  Location: [place] [facility];  Service: Cardiovascular;  Laterality: Right;    Social History Social History   Tobacco Use  . Smoking  status: Never Smoker  . Smokeless tobacco: Never Used  Vaping Use  . Vaping Use: Never used  Substance Use Topics  . Alcohol use: No  . Drug use: No    Family History Family History  Problem Relation Age of Onset  . Hypertension Sister     Allergies  Allergen Reactions  . Penicillins Hives, Itching and Swelling    Has patient had a PCN reaction causing immediate rash, facial/tongue/throat swelling, SOB or lightheadedness with hypotension: No Has patient had a PCN reaction causing severe rash involving mucus membranes or skin necrosis: No Has patient had a PCN reaction that required hospitalization: No Has patient had a PCN reaction occurring within the last 10 years: No If all of the above answers are "NO", then may proceed with Cephalosporin use.  Penicillin allergy assessment completed on 03/22/20. Patient has received and tolerated cepholosporins incuding cefepime, ceftriaxone.   . Codeine Sulfate Nausea Only  . Contrast Media [Iodinated Diagnostic Agents] Hives  . Gadobutrol Hives  . Gadolinium Derivatives Hives  . Statins Other (See Comments)    muscle pain     REVIEW OF SYSTEMS (Negative unless checked)  Constitutional: [] Weight loss  [] Fever  [] Chills Cardiac: [] Chest pain   [] Chest pressure   [] Palpitations   [] Shortness of breath when laying flat   [] Shortness of breath with exertion. Vascular:  [] Pain in legs with walking   [] Pain in legs at rest  [] History of  DVT   [] Phlebitis   [] Swelling in legs   [] Varicose veins   [] Non-healing ulcers Pulmonary:   [] Uses home oxygen   [] Productive cough   [] Hemoptysis   [] Wheeze  [] COPD   [] Asthma Neurologic:  [] Dizziness   [] Seizures   [] History of stroke   [] History of TIA  [] Aphasia   [] Vissual changes   [] Weakness or numbness in arm   [] Weakness or numbness in leg Musculoskeletal:   [] Joint swelling   [] Joint pain   [] Low back pain Hematologic:  [] Easy bruising  [] Easy bleeding   [] Hypercoagulable state    [] Anemic Gastrointestinal:  [] Diarrhea   [] Vomiting  [] Gastroesophageal reflux/heartburn   [] Difficulty swallowing. Genitourinary:  [x] Chronic kidney disease   [] Difficult urination  [] Frequent urination   [] Blood in urine Skin:  [] Rashes   [] Ulcers  Psychological:  [] History of anxiety   []  History of major depression.  Physical Examination  There were no vitals filed for this visit. There is no height or weight on file to calculate BMI. Gen: WD/WN, NAD Head: [place]/AT, No temporalis wasting.  Ear/Nose/Throat: Hearing grossly intact, nares w/o erythema or drainage Eyes: PER, EOMI, sclera nonicteric.  Neck: Supple, no large masses.   Pulmonary:  Good air movement, no audible wheezing bilaterally, no use of accessory muscles.  Cardiac: RRR, no JVD Vascular: Multiple old grafts nonfunctioning bilateral upper extremities  Vessel Right Left  Radial Trace Palpable Trace Palpable  Ulnar Not Palpable Not Palpable  Brachial Palpable Palpable  Carotid Palpable Palpable  Gastrointestinal: Non-distended. No guarding/no peritoneal signs.  Musculoskeletal: M/S 5/5 throughout.  No deformity or atrophy.  Neurologic: CN 2-12 intact. Symmetrical.  Speech is fluent. Motor exam as listed above. Psychiatric: Judgment intact, Mood & affect appropriate for pt's clinical situation.    CBC Lab Results  Component Value Date   WBC 7.8 01/21/2019   HGB 7.2 (L) 01/21/2019   HCT 23.1 (L) 01/21/2019   MCV 87.2 01/21/2019   PLT 146 (L) 01/21/2019    BMET    Component Value Date/Time   NA 142 01/21/2019 0504   NA 135 (L) 03/29/2013 1409   K 5.2 (H) 01/21/2019 0504   K 4.5 03/29/2013 1409   CL 104 01/21/2019 0504   CL 97 (L) 03/29/2013 1409   CO2 25 01/21/2019 0504   CO2 27 03/29/2013 1409   GLUCOSE 102 (H) 01/21/2019 0504   GLUCOSE 97 03/29/2013 1409   BUN 72 (H) 01/21/2019 0504   BUN 48 (H) 03/29/2013 1409   CREATININE 12.69 (H) 01/21/2019 0504   CREATININE 12.06 (H) 03/29/2013 1409   CALCIUM  7.8 (L) 01/21/2019 0504   CALCIUM 8.8 03/29/2013 1409   GFRNONAA 3 (L) 01/21/2019 0504   GFRNONAA 3 (L) 03/29/2013 1409   GFRAA 3 (L) 01/21/2019 0504   GFRAA 3 (L) 03/29/2013 1409   CrCl cannot be calculated (Patient's most recent lab result is older than the maximum 21 days allowed.).  COAG Lab Results  Component Value Date   INR 1.0 01/19/2019    Radiology No results found.   Assessment/Plan 1. Complication of vascular access for dialysis, sequela Recommend:  At this time the patient does not have appropriate extremity access for dialysis  Patient should have a right arm HERO created.  The risks, benefits and alternative therapies were reviewed in detail with the patient.  All questions were answered.  The patient agrees to proceed with surgery.    2. ESRD (end stage renal disease) ([place]) At the present time the patient has  adequate dialysis access.  Continue hemodialysis as ordered without interruption.  Avoid nephrotoxic medications and dehydration.  Further plans per nephrology  3. Superior vena cava compression syndrome Patient will require a HERO graft  4. Coronary artery disease involving native coronary artery of native heart with angina pectoris ([place]) Continue cardiac and antihypertensive medications as already ordered and reviewed, no changes at this time.  Continue statin as ordered and reviewed, no changes at this time  Nitrates PRN for chest pain     Hortencia Pilar, MD  07/16/2020 11:33 AM

## 2020-07-17 ENCOUNTER — Other Ambulatory Visit: Payer: Self-pay

## 2020-07-17 ENCOUNTER — Ambulatory Visit (INDEPENDENT_AMBULATORY_CARE_PROVIDER_SITE_OTHER): Payer: Medicare Other | Admitting: Vascular Surgery

## 2020-07-17 VITALS — BP 152/69 | HR 67 | Ht 59.0 in | Wt 165.0 lb

## 2020-07-17 DIAGNOSIS — T829XXS Unspecified complication of cardiac and vascular prosthetic device, implant and graft, sequela: Secondary | ICD-10-CM

## 2020-07-17 DIAGNOSIS — N186 End stage renal disease: Secondary | ICD-10-CM | POA: Diagnosis not present

## 2020-07-17 DIAGNOSIS — I871 Compression of vein: Secondary | ICD-10-CM

## 2020-07-17 DIAGNOSIS — I25119 Atherosclerotic heart disease of native coronary artery with unspecified angina pectoris: Secondary | ICD-10-CM | POA: Diagnosis not present

## 2020-07-18 ENCOUNTER — Encounter (INDEPENDENT_AMBULATORY_CARE_PROVIDER_SITE_OTHER): Payer: Self-pay | Admitting: Vascular Surgery

## 2020-08-19 NOTE — Progress Notes (Signed)
New Outpatient Visit Date: 08/21/2020  Referring Provider: Katha Cabal, MD [address] [place],  [place] [id number]  Chief Complaint: Preop evaluation  HPI:  [place] Nguyen is a 75 y.o. female who is being seen today for preoperative cardiovascular evaluation in anticipation of dialysis access placement at the request of Dr. Delana Meyer. She has a history of coronary artery disease status post CABG in 2008 (LIMA to LAD, SVG to OM, and SVG to PDA), hypertension, hyperlipidemia, diabetes mellitus, [place] Nguyen-stage renal disease on hemodialysis, chronic cough, neuropathy, and anemia.  She was seen by Dr. Delana Meyer last month complaining of persistent left lower extremity pain.  She has had issues with dialysis access in the past and required excision of infected left arm hero graft in 01/2019.  She is currently dialyzing through a tunneled catheter.  Today, [name] reports feeling well.  Other than chronic bilateral leg edema, she is without complaints.  She denies chest pain, shortness of breath, palpitations, and lightheadedness.  She ambulates with a rolling walker after injuring her left hip in 2011; she continues to have pain in that leg.  She dialyzes regularly (Tuesday, Thursday, and Saturday).  --------------------------------------------------------------------------------------------------  Cardiovascular History & Procedures: Cardiovascular Problems:  Coronary artery disease  Risk Factors:  Known CAD, hypertension, hyperlipidemia, diabetes mellitus, obesity, and age greater than 41  Cath/PCI:  LHC (02/26/2014, [facility]): LMCA with 70% distal stenosis extending into ostial LAD and LCx.  LAD has 80% ostial/proximal stenosis and diffuse 90 to 95% disease in the mid vessel.  Distal LAD fills via LIMA graft.  LCx demonstrates chronic total occlusion of OM2, which is supplied by SVG.  LCx has 60% ostial disease and 70 to 80% mid vessel stenosis.  Distal LCx is subtotally occluded with left to left  collaterals.  Dominant RCA is chronically occluded in its midsection and Illes distally via patent SVG to PDA.  SVG to OM and SVG to RPDA are widely patent.  LIMA to LAD is not well visualized but appears to have at least 80 to 90% stenosis at the distal anastomosis extending into the native LAD.  CV Surgery:  CABG (2008): LIMA to LAD, SVG to OM, and SVG to RPDA  EP Procedures and Devices:  None  Non-Invasive Evaluation(s):  TTE (07/02/2017, [facility]): Normal LV size with LVEF greater than 55%.  Mild mitral regurgitation.  Aortic sclerosis.  Mild to moderate tricuspid regurgitation.  Normal RV systolic function with moderate pulmonary hypertension.  Recent CV Pertinent Labs: Lab Results  Component Value Date   INR 1.0 01/19/2019   K 5.2 (H) 01/21/2019   K 4.5 03/29/2013   BUN 72 (H) 01/21/2019   BUN 48 (H) 03/29/2013   CREATININE 12.69 (H) 01/21/2019   CREATININE 12.06 (H) 03/29/2013    --------------------------------------------------------------------------------------------------  Past Medical History:  Diagnosis Date  . Anemia   . Chronic cough   . Chronic kidney disease   . Coronary artery disease   . Diabetes mellitus without complication ([place])   . Dialysis patient ([place])    T-TH-SAT  . GERD (gastroesophageal reflux disease)   . Hypertension   . Neuropathy    hands  . Wears dentures    partial upper and lower    Past Surgical History:  Procedure Laterality Date  . A/V SHUNT INTERVENTION N/A 10/16/2016   Procedure: A/V Shunt Intervention;  Surgeon: Katha Cabal, MD;  Location: [place] [facility];  Service: Cardiovascular;  Laterality: N/A;  . A/V SHUNTOGRAM Left 10/16/2016   Procedure:  A/V Shuntogram;  Surgeon: Katha Cabal, MD;  Location: [place] [facility];  Service: Cardiovascular;  Laterality: Left;  . ABDOMINAL HYSTERECTOMY    . CARPAL TUNNEL RELEASE Left 09/27/2015   Procedure: CARPAL TUNNEL RELEASE;  Surgeon: Leanor Kail, MD;  Location: [facility]  ORS;  Service: Orthopedics;  Laterality: Left;  . CATARACT EXTRACTION W/PHACO Right 09/23/2016   Procedure: CATARACT EXTRACTION PHACO AND INTRAOCULAR LENS PLACEMENT (IOC) complicated diabetic right;  Surgeon: Ronnell Freshwater, MD;  Location: [facility];  Service: Ophthalmology;  Laterality: Right;  Diabetic - diet controlled Potassium draw before surgery  . CATARACT EXTRACTION W/PHACO Left 01/20/2017   Procedure: CATARACT EXTRACTION PHACO AND INTRAOCULAR LENS PLACEMENT (IOC) LEFT DIABETIC;  Surgeon: Eulogio Bear, MD;  Location: [place];  Service: Ophthalmology;  Laterality: Left;  Diabetic - diet controlled Potassium draw prior to procedure  8:00 ARRIVAL  . COLONOSCOPY    . COLONOSCOPY WITH PROPOFOL N/A 11/28/2014   Procedure: COLONOSCOPY WITH PROPOFOL;  Surgeon: Manya Silvas, MD;  Location: [facility] ENDOSCOPY;  Service: Endoscopy;  Laterality: N/A;  . CORONARY ARTERY BYPASS GRAFT    . DIALYSIS/PERMA CATHETER INSERTION Left 01/21/2019   Procedure: DIALYSIS/PERMA CATHETER EXCHANGE;  Surgeon: Algernon Huxley, MD;  Location: [place] [facility];  Service: Cardiovascular;  Laterality: Left;  . INSERTION OF DIALYSIS CATHETER Left 01/19/2019   Procedure: INSERTION OF DIALYSIS CATHETER;  Surgeon: Katha Cabal, MD;  Location: [facility] ORS;  Service: Vascular;  Laterality: Left;  . LIGATIONS OF HERO GRAFT Left 01/19/2019   Procedure: EXCISION LEFT HERO GRAFT;  Surgeon: Katha Cabal, MD;  Location: [facility] ORS;  Service: Vascular;  Laterality: Left;  . PERIPHERAL VASCULAR CATHETERIZATION Left 01/18/2015   Procedure: A/V Shuntogram/Fistulagram;  Surgeon: Katha Cabal, MD;  Location: [place] [facility];  Service: Cardiovascular;  Laterality: Left;  . PERIPHERAL VASCULAR CATHETERIZATION Left 01/18/2015   Procedure: A/V Shunt Intervention;  Surgeon: Katha Cabal, MD;  Location: [place] [facility];  Service: Cardiovascular;  Laterality: Left;  . PERIPHERAL VASCULAR  CATHETERIZATION N/A 08/10/2015   Procedure: Thrombectomy;  Surgeon: Algernon Huxley, MD;  Location: [place] [facility];  Service: Cardiovascular;  Laterality: N/A;  . PERIPHERAL VASCULAR CATHETERIZATION N/A 08/10/2015   Procedure: A/V Shuntogram/Fistulagram;  Surgeon: Algernon Huxley, MD;  Location: [place] [facility];  Service: Cardiovascular;  Laterality: N/A;  . PERIPHERAL VASCULAR CATHETERIZATION N/A 08/10/2015   Procedure: A/V Shunt Intervention;  Surgeon: Algernon Huxley, MD;  Location: [place] [facility];  Service: Cardiovascular;  Laterality: N/A;  . PERIPHERAL VASCULAR THROMBECTOMY Left 01/12/2019   Procedure: PERIPHERAL VASCULAR THROMBECTOMY;  Surgeon: Katha Cabal, MD;  Location: [place] [facility];  Service: Cardiovascular;  Laterality: Left;  . REMOVAL OF A DIALYSIS CATHETER Right 01/19/2019   Procedure: REMOVAL OF A DIALYSIS CATHETER;  Surgeon: Katha Cabal, MD;  Location: [facility] ORS;  Service: Vascular;  Laterality: Right;  . RENAL SHUNTS    . UPPER EXTREMITY VENOGRAPHY Right 03/20/2020   Procedure: UPPER EXTREMITY VENOGRAPHY;  Surgeon: Algernon Huxley, MD;  Location: [place] [facility];  Service: Cardiovascular;  Laterality: Right;    Current Meds  Medication Sig  . acetaminophen (TYLENOL) 500 MG tablet   . aspirin 81 MG tablet Take 81 mg by mouth daily.  . cinacalcet (SENSIPAR) 30 MG tablet Take 30 mg by mouth 3 (three) times a week. On dialysis days - Tues, Thurs, Sat  . diclofenac sodium (VOLTAREN) 1 % GEL  APP 4 GRAMS EXT AA UP TO QID FOR PAIN. DO NOT EXCEED 16 GM FOR LE JOINT PER DAY  . DiphenhydrAMINE HCl, Sleep, 50 MG tablet Take 1 tablet by mouth 30 minutes before CT scan  . DUREZOL 0.05 % EMUL Place 1 drop into both eyes 2 (two) times daily.  . isosorbide mononitrate (IMDUR) 30 MG 24 hr tablet Take 30 mg by mouth daily.  [name] latanoprost (XALATAN) 0.005 % ophthalmic solution Place 1 drop into both eyes 2 (two) times daily.   [name] LOPERAMIDE HCL PO Take by mouth.  . Methoxy  PEG-Epoetin Beta (MIRCERA IJ) Mircera  . pantoprazole (PROTONIX) 40 MG tablet Take 40 mg by mouth daily.  [name] POLYETHYLENE GLYCOL 3350 PO as needed.   . pravastatin (PRAVACHOL) 20 MG tablet Take 20 mg by mouth daily.  . sevelamer carbonate (RENVELA) 800 MG tablet Take by mouth.  . Timolol Maleate 0.5 % (DAILY) SOLN Apply 1 drop to eye every morning.  . [DISCONTINUED] omeprazole (PRILOSEC) 40 MG capsule Take by mouth.  . [DISCONTINUED] ranitidine (ZANTAC) 150 MG tablet Take 150 mg by mouth 2 (two) times daily.  . [DISCONTINUED] timolol (TIMOPTIC) 0.5 % ophthalmic solution INSTILL 1 DROP IN BOTH EYES EVERY DAY    Allergies: Penicillins, Codeine sulfate, Contrast media [iodinated diagnostic agents], Gadobutrol, Gadolinium derivatives, and Statins  Social History   Tobacco Use  . Smoking status: Never Smoker  . Smokeless tobacco: Never Used  Vaping Use  . Vaping Use: Never used  Substance Use Topics  . Alcohol use: No  . Drug use: No    Family History  Problem Relation Age of Onset  . Hypertension Sister     Review of Systems: A 12-system review of systems was performed and was negative except as noted in the HPI.  --------------------------------------------------------------------------------------------------  Physical Exam: BP 114/68   Pulse 67   Ht 4\' 11"  (1.499 m)   Wt 166 lb (75.3 kg)   BMI 33.53 kg/m   General:  NAD. HEENT: No conjunctival pallor or scleral icterus. Facemask in place. Neck: Supple without lymphadenopathy, thyromegaly, JVD, or HJR. No carotid bruit. Lungs: Normal work of breathing. Clear to auscultation bilaterally without wheezes or crackles. Heart: Regular rate and rhythm with occasional extrasystoles.  1/6 systolic murmur noted. Abd: Bowel sounds present. Soft, NT/ND without hepatosplenomegaly Ext: 1+ pretibial edema bilaterally. Skin: Warm and dry without rash. Neuro: CNIII-XII intact. Psych: Normal mood and affect.  EKG:  Normal sinus  rhythm with frequent PVC's and right bundle branch block.  Compared with prior tracing from 01/19/2019, PVC's are now present.  Otherwise, no significant change.  Lab Results  Component Value Date   WBC 7.8 01/21/2019   HGB 7.2 (L) 01/21/2019   HCT 23.1 (L) 01/21/2019   MCV 87.2 01/21/2019   PLT 146 (L) 01/21/2019    Lab Results  Component Value Date   NA 142 01/21/2019   K 5.2 (H) 01/21/2019   CL 104 01/21/2019   CO2 25 01/21/2019   BUN 72 (H) 01/21/2019   CREATININE 12.69 (H) 01/21/2019   GLUCOSE 102 (H) 01/21/2019   ALT 17 09/26/2015    No results found for: CHOL, HDL, LDLCALC, LDLDIRECT, TRIG, CHOLHDL  --------------------------------------------------------------------------------------------------  ASSESSMENT AND PLAN: Coronary artery disease with stable angina: Ms. Empie has a long h/o CAD s/p CABG in 2008 and LHC in 20015 suggesting severe disease involving the LIMA-LAD (though graft was apparently not well-visualized).  No intervention was undertaken.  She is asymptomatic at this  time on an antianginal regimen of isosorbide mononitrate 30 mg daily.  Given planned vascular surgery and limited functional capacity, we have agreed to perform a pharmacologic myocardial perfusion stress test to exclude high-risk ischemia.  Continue aspirin and pravastatin for secondary prevention.  Valvular heart disease and pulmonary hypertension: Echo in 2019 at [facility] made note of aortic sclerosis and mild to moderate tricuspid regurgitation.  Soft systolic murmur noted today; EKG shows old RBBB as well as frequent PVC's.  We will plan to repeat an echocardiogram to reassess her valvular heart disease and pulmonary hypertension, which was in the moderate range on her last echo.  Of note, she does not report any heart failure symptoms, though 1+ edema is noted in both calves (patient states this is chronic).  Hyperlipidemia associated with type 2 diabetes mellitus: No recent lipid panel available  in our system.  We will plan to continue pravastatin with ongoing lipid follow-up per her PCP (goal LDL < 70).  Of note, she has been intolerant of other statins in the past due to myalgias.  ESRD: Patient reports long history of kidney failure due to hypertension and diabetes mellitus.  Continue dialysis as prescribed by nephrology.  Preop evaluation: Given plans for vascular surgery with history of significant coronary artery disease and limited functional capacity (unable to perform >4 METS of activity), I have recommended obtaining a pharmacologic myocardial perfusion stress test and echocardiogram before proceeding with elective surgery.  If echo findings are stable and there is no evidence of high-risk ischemia on the stress test, I think it would be reasonable for Ms. Hanaway to proceed with HERO graft placement without additional cardiac intervention.  Shared Decision Making/Informed Consent The risks [chest pain, shortness of breath, cardiac arrhythmias, dizziness, blood pressure fluctuations, myocardial infarction, stroke/transient ischemic attack, nausea, vomiting, allergic reaction, radiation exposure, metallic taste sensation and life-threatening complications (estimated to be 1 in 10,000)], benefits (risk stratification, diagnosing coronary artery disease, treatment guidance) and alternatives of a nuclear stress test were discussed in detail with Ms. Vera and she agrees to proceed.  Follow-up: Return to clinic in 6 months.  Nelva Bush, MD 08/21/2020 9:56 AM

## 2020-08-21 ENCOUNTER — Ambulatory Visit (INDEPENDENT_AMBULATORY_CARE_PROVIDER_SITE_OTHER): Payer: Medicare Other | Admitting: Internal Medicine

## 2020-08-21 ENCOUNTER — Other Ambulatory Visit: Payer: Self-pay

## 2020-08-21 ENCOUNTER — Encounter: Payer: Self-pay | Admitting: Internal Medicine

## 2020-08-21 VITALS — BP 114/68 | HR 67 | Ht 59.0 in | Wt 166.0 lb

## 2020-08-21 DIAGNOSIS — E785 Hyperlipidemia, unspecified: Secondary | ICD-10-CM

## 2020-08-21 DIAGNOSIS — I25118 Atherosclerotic heart disease of native coronary artery with other forms of angina pectoris: Secondary | ICD-10-CM | POA: Diagnosis not present

## 2020-08-21 DIAGNOSIS — N186 End stage renal disease: Secondary | ICD-10-CM | POA: Diagnosis not present

## 2020-08-21 DIAGNOSIS — I38 Endocarditis, valve unspecified: Secondary | ICD-10-CM | POA: Diagnosis not present

## 2020-08-21 DIAGNOSIS — E1169 Type 2 diabetes mellitus with other specified complication: Secondary | ICD-10-CM | POA: Diagnosis not present

## 2020-08-21 DIAGNOSIS — I272 Pulmonary hypertension, unspecified: Secondary | ICD-10-CM

## 2020-08-21 DIAGNOSIS — Z01810 Encounter for preprocedural cardiovascular examination: Secondary | ICD-10-CM

## 2020-08-21 NOTE — Patient Instructions (Signed)
Medication Instructions:  Your physician recommends that you continue on your current medications as directed. Please refer to the Current Medication list given to you today.  *If you need a refill on your cardiac medications before your next appointment, please call your pharmacy*   Lab Work: None  If you have labs (blood work) drawn today and your tests are completely normal, you will receive your results only by: [name] MyChart Message (if you have MyChart) OR . A paper copy in the mail If you have any lab test that is abnormal or we need to change your treatment, we will call you to review the results.   Testing/Procedures: Your physician has requested that you have an echocardiogram. Echocardiography is a painless test that uses sound waves to create images of your heart. It provides your doctor with information about the size and shape of your heart and how well your heart's chambers and valves are working. This procedure takes approximately one hour. There are no restrictions for this procedure.  [place]  Your caregiver has ordered a Stress Test with nuclear imaging. The purpose of this test is to evaluate the blood supply to your heart muscle. This procedure is referred to as a "Non-Invasive Stress Test." This is because other than having an IV started in your vein, nothing is inserted or "invades" your body. Cardiac stress tests are done to find areas of poor blood flow to the heart by determining the extent of coronary artery disease (CAD). Some patients exercise on a treadmill, which naturally increases the blood flow to your heart, while others who are  unable to walk on a treadmill due to physical limitations have a pharmacologic/chemical stress agent called Lexiscan . This medicine will mimic walking on a treadmill by temporarily increasing your coronary blood flow.   Please note: these test may take anywhere between 2-4 hours to complete  PLEASE REPORT TO [place] AT THE FIRST DESK WILL DIRECT YOU WHERE TO GO  Date of Procedure:_____________________________________  Arrival Time for Procedure:______________________________  Instructions regarding medication:   PLEASE NOTIFY THE OFFICE AT LEAST 24 HOURS IN ADVANCE IF YOU ARE UNABLE TO KEEP YOUR APPOINTMENT.  [phone] AND  PLEASE NOTIFY NUCLEAR MEDICINE AT [facility] AT LEAST 24 HOURS IN ADVANCE IF YOU ARE UNABLE TO KEEP YOUR APPOINTMENT. [phone]  How to prepare for your Myoview test:  1. Do not eat or drink after midnight 2. No caffeine for 24 hours prior to test 3. No smoking 24 hours prior to test. 4. Your medication may be taken with water.  If your doctor stopped a medication because of this test, do not take that medication. 5. Ladies, please do not wear dresses.  Skirts or pants are appropriate. Please wear a short sleeve shirt. 6. No perfume, cologne or lotion. 7. Wear comfortable walking shoes. No heels!   Follow-Up: At [facility], you and your health needs are our priority.  As part of our continuing mission to provide you with exceptional heart care, we have created designated Provider Care Teams.  These Care Teams include your primary Cardiologist (physician) and Advanced Practice Providers (APPs -  Physician Assistants and Nurse Practitioners) who all work together to provide you with the care you need, when you need it.  We recommend signing up for the patient portal called "MyChart".  Sign up information is provided on this After Visit Summary.  MyChart is used to connect with patients for Virtual Visits (Telemedicine).  Patients are able to view lab/test results, encounter notes, upcoming appointments, etc.  Non-urgent messages can be sent to your provider as well.   To learn more about what you can do with MyChart, go to [email or web address].    Your next appointment:   6 month(s)  The format for your next appointment:   In Person  Provider:    You may see Nelva Bush, MD or one of the following Advanced Practice Providers on your designated Care Team:    Murray Hodgkins, NP  Christell Faith, PA-C  Marrianne Mood, PA-C  Cadence Ha[place], [place]  Laurann Montana, NP    Other Instructions None

## 2020-08-22 ENCOUNTER — Ambulatory Visit: Payer: Medicare Other | Admitting: Cardiology

## 2020-09-19 ENCOUNTER — Other Ambulatory Visit: Payer: Self-pay | Admitting: Internal Medicine

## 2020-09-19 DIAGNOSIS — I272 Pulmonary hypertension, unspecified: Secondary | ICD-10-CM

## 2020-09-19 DIAGNOSIS — I38 Endocarditis, valve unspecified: Secondary | ICD-10-CM

## 2020-09-19 DIAGNOSIS — Z01810 Encounter for preprocedural cardiovascular examination: Secondary | ICD-10-CM

## 2020-09-20 ENCOUNTER — Ambulatory Visit (INDEPENDENT_AMBULATORY_CARE_PROVIDER_SITE_OTHER): Payer: Medicare Other

## 2020-09-20 ENCOUNTER — Other Ambulatory Visit: Payer: Self-pay

## 2020-09-20 ENCOUNTER — Encounter
Admission: RE | Admit: 2020-09-20 | Discharge: 2020-09-20 | Disposition: A | Discharge status: Home / Self Care | Payer: Medicare Other | Source: Ambulatory Visit | Attending: Internal Medicine | Admitting: Internal Medicine

## 2020-09-20 DIAGNOSIS — Z01810 Encounter for preprocedural cardiovascular examination: Secondary | ICD-10-CM | POA: Diagnosis present

## 2020-09-20 DIAGNOSIS — E1169 Type 2 diabetes mellitus with other specified complication: Secondary | ICD-10-CM

## 2020-09-20 DIAGNOSIS — I272 Pulmonary hypertension, unspecified: Secondary | ICD-10-CM

## 2020-09-20 DIAGNOSIS — E785 Hyperlipidemia, unspecified: Secondary | ICD-10-CM | POA: Diagnosis not present

## 2020-09-20 DIAGNOSIS — I38 Endocarditis, valve unspecified: Secondary | ICD-10-CM | POA: Diagnosis not present

## 2020-09-20 DIAGNOSIS — I25118 Atherosclerotic heart disease of native coronary artery with other forms of angina pectoris: Secondary | ICD-10-CM

## 2020-09-20 LAB — NM MYOCAR MULTI W/SPECT W/WALL MOTION / EF
LV dias vol: 44 mL (ref 46–106)
LV sys vol: 15 mL
Peak HR: 89 {beats}/min
Percent HR: 60 %
Rest HR: 60 {beats}/min
SDS: 1
SRS: 3
SSS: 0
TID: 0.79

## 2020-09-20 LAB — ECHOCARDIOGRAM COMPLETE
AR max vel: 2.25 cm2
AV Area VTI: 2.18 cm2
AV Area mean vel: 2.04 cm2
AV Mean grad: 5 mmHg
AV Peak grad: 9.1 mmHg
Ao pk vel: 1.51 m/s
Area-P 1/2: 3.7 cm2
Single Plane A4C EF: 61.5 %

## 2020-09-20 MED ORDER — REGADENOSON 0.4 MG/5ML IV SOLN
0.4000 mg | Freq: Once | INTRAVENOUS | Status: AC
  Administered 2020-09-20: 0.4 mg via INTRAVENOUS
  Filled 2020-09-20: qty 5

## 2020-09-20 MED ORDER — TECHNETIUM TC 99M TETROFOSMIN IV KIT
10.9200 | PACK | Freq: Once | INTRAVENOUS | Status: AC | PRN
  Administered 2020-09-20: 10.92 via INTRAVENOUS

## 2020-09-20 MED ORDER — REGADENOSON 0.4 MG/5ML IV SOLN
0.4000 mg | Freq: Once | INTRAVENOUS | Status: DC
  Filled 2020-09-20: qty 5

## 2020-09-20 MED ORDER — TECHNETIUM TC 99M TETROFOSMIN IV KIT
30.0000 | PACK | Freq: Once | INTRAVENOUS | Status: AC | PRN
  Administered 2020-09-20: 29.9 via INTRAVENOUS

## 2020-10-13 ENCOUNTER — Telehealth (INDEPENDENT_AMBULATORY_CARE_PROVIDER_SITE_OTHER): Payer: Self-pay

## 2020-10-13 NOTE — Telephone Encounter (Addendum)
Spoke with the patient and she is scheduled with Dr. Delana Meyer for insertion of right arm HERO graft on 11/24/20 at the [facility]. Pre-op phone call is  on 11/17/20 between 8-1 pm. Pre-surgical instructions were discussed and will be mailed.

## 2020-10-25 ENCOUNTER — Other Ambulatory Visit: Payer: Self-pay | Admitting: Family Medicine

## 2020-10-25 DIAGNOSIS — Z1231 Encounter for screening mammogram for malignant neoplasm of breast: Secondary | ICD-10-CM

## 2020-11-14 DIAGNOSIS — E1142 Type 2 diabetes mellitus with diabetic polyneuropathy: Secondary | ICD-10-CM | POA: Insufficient documentation

## 2020-11-14 DIAGNOSIS — E11319 Type 2 diabetes mellitus with unspecified diabetic retinopathy without macular edema: Secondary | ICD-10-CM | POA: Insufficient documentation

## 2020-11-16 ENCOUNTER — Other Ambulatory Visit (INDEPENDENT_AMBULATORY_CARE_PROVIDER_SITE_OTHER): Payer: Self-pay | Admitting: Nurse Practitioner

## 2020-11-17 ENCOUNTER — Encounter
Admission: RE | Admit: 2020-11-17 | Discharge: 2020-11-17 | Disposition: A | Discharge status: Home / Self Care | Payer: Medicare Other | Source: Ambulatory Visit | Attending: Vascular Surgery | Admitting: Vascular Surgery

## 2020-11-17 ENCOUNTER — Other Ambulatory Visit
Admission: RE | Admit: 2020-11-17 | Discharge: 2020-11-17 | Disposition: A | Discharge status: Home / Self Care | Payer: Medicare Other | Source: Ambulatory Visit | Attending: Vascular Surgery | Admitting: Vascular Surgery

## 2020-11-17 ENCOUNTER — Telehealth: Payer: Self-pay | Admitting: *Deleted

## 2020-11-17 ENCOUNTER — Other Ambulatory Visit: Payer: Self-pay

## 2020-11-17 DIAGNOSIS — Z01818 Encounter for other preprocedural examination: Secondary | ICD-10-CM | POA: Insufficient documentation

## 2020-11-17 HISTORY — DX: Unspecified cataract: H26.9

## 2020-11-17 HISTORY — DX: Pneumonia, unspecified organism: J18.9

## 2020-11-17 HISTORY — DX: Carpal tunnel syndrome, left upper limb: G56.02

## 2020-11-17 HISTORY — DX: End stage renal disease: N18.6

## 2020-11-17 LAB — CBC WITH DIFFERENTIAL/PLATELET
Abs Immature Granulocytes: 0.02 10*3/uL (ref 0.00–0.07)
Basophils Absolute: 0 10*3/uL (ref 0.0–0.1)
Basophils Relative: 1 %
Eosinophils Absolute: 0.4 10*3/uL (ref 0.0–0.5)
Eosinophils Relative: 4 %
HCT: 32.7 % — ABNORMAL LOW (ref 36.0–46.0)
Hemoglobin: 10.5 g/dL — ABNORMAL LOW (ref 12.0–15.0)
Immature Granulocytes: 0 %
Lymphocytes Relative: 25 %
Lymphs Abs: 2.1 10*3/uL (ref 0.7–4.0)
MCH: 28 pg (ref 26.0–34.0)
MCHC: 32.1 g/dL (ref 30.0–36.0)
MCV: 87.2 fL (ref 80.0–100.0)
Monocytes Absolute: 0.8 10*3/uL (ref 0.1–1.0)
Monocytes Relative: 10 %
Neutro Abs: 4.9 10*3/uL (ref 1.7–7.7)
Neutrophils Relative %: 60 %
Platelets: 231 10*3/uL (ref 150–400)
RBC: 3.75 MIL/uL — ABNORMAL LOW (ref 3.87–5.11)
RDW: 16.4 % — ABNORMAL HIGH (ref 11.5–15.5)
WBC: 8.2 10*3/uL (ref 4.0–10.5)
nRBC: 0 % (ref 0.0–0.2)

## 2020-11-17 LAB — BASIC METABOLIC PANEL
Anion gap: 9 (ref 5–15)
BUN: 30 mg/dL — ABNORMAL HIGH (ref 8–23)
CO2: 32 mmol/L (ref 22–32)
Calcium: 8.5 mg/dL — ABNORMAL LOW (ref 8.9–10.3)
Chloride: 100 mmol/L (ref 98–111)
Creatinine, Ser: 6.08 mg/dL — ABNORMAL HIGH (ref 0.44–1.00)
GFR, Estimated: 7 mL/min — ABNORMAL LOW (ref >60)
Glucose, Bld: 122 mg/dL — ABNORMAL HIGH (ref 70–99)
Potassium: 3.7 mmol/L (ref 3.5–5.1)
Sodium: 141 mmol/L (ref 135–145)

## 2020-11-17 LAB — APTT: aPTT: 37 seconds — ABNORMAL HIGH (ref 24–36)

## 2020-11-17 LAB — PROTIME-INR
INR: 1.1 (ref 0.8–1.2)
Prothrombin Time: 13.9 seconds (ref 11.4–15.2)

## 2020-11-17 NOTE — Telephone Encounter (Signed)
    Patient Name: Kelli Nguyen  DOB: 01/28/1946 MRN: [id number]  Primary Cardiologist: Nelva Bush, MD  Chart reviewed as part of pre-operative protocol coverage. She was seen by Dr. Saunders Revel 08/2020 for preop assessment for the same procedure. She has an history of CAD s/p CABG in 2008 without subsequent intervention. She was recommended to undergo an echocardiogram and stress test at that time for preop evaluation given limited activity. Echo showed EF 55-60%, G2DD, no RWMA, and no significant valvular abnormalities. NST was without ischemia. She was cleared to proceed with HERO graft at that time, however there was no comment on or specific request to hold aspirin.  Dr. Saunders Revel - can you comment on recommendations for holding aspirin prior to HERO graft scheduled 11/24/20. Please route your response back to P CV DIV PREOP. Thanks!  Abigail Butts, PA-C 11/17/2020, 2:52 PM

## 2020-11-17 NOTE — Patient Instructions (Addendum)
Your procedure is scheduled on: Friday, July 22 Report to the Registration Desk on the 1st floor of the [facility]. To find out your arrival time, please call [phone] between 1PM - 3PM on: Thursday, July 21  REMEMBER: Instructions that are not followed completely may result in serious medical risk, up to and including death; or upon the discretion of your surgeon and anesthesiologist your surgery may need to be rescheduled.  Do not eat food after midnight the night before surgery.  No gum chewing, lozengers or hard candies.  You may however, drink water up to 2 hours before you are scheduled to arrive for your surgery. Do not drink anything within 2 hours of your scheduled arrival time.  TAKE THESE MEDICATIONS THE MORNING OF SURGERY WITH A SIP OF WATER:  Pantoprazole (Protonix) - (take one the night before and one on the morning of surgery - helps to prevent nausea after surgery.) Pravastatin Timolol eye drop Cephalexin (Keflex)  Continue taking the aspirin up until the day of surgery. Just do NOT take the morning of surgery. Resume the aspirin AFTER surgery per Dr. Nino Parsley instruction.  One week prior to surgery: starting July 15 Stop Anti-inflammatories (NSAIDS) such as Advil, Aleve, Ibuprofen, Motrin, Naproxen, Naprosyn and Aspirin based products such as Excedrin, Goodys Powder, BC Powder. Stop ANY OVER THE COUNTER supplements until after surgery. You may however, continue to take Tylenol if needed for pain up until the day of surgery.  No Alcohol for 24 hours before or after surgery.  On the morning of surgery brush your teeth with toothpaste and water, you may rinse your mouth with mouthwash if you wish. Do not swallow any toothpaste or mouthwash.  Do not wear jewelry, make-up, hairpins, clips or nail polish.  Do not wear lotions, powders, or perfumes.   Do not shave body from the neck down 48 hours prior to surgery just in case you cut yourself which could leave a  site for infection.  Also, freshly shaved skin may become irritated if using the CHG soap.  Contact lenses, hearing aids and dentures may not be worn into surgery.  Do not bring valuables to the hospital. [facility] is not responsible for any missing/lost belongings or valuables.   Use CHG Soap as directed on instruction sheet.  Notify your doctor if there is any change in your medical condition (cold, fever, infection).  Wear comfortable clothing (specific to your surgery type) to the hospital.  If you are being discharged the day of surgery, you will not be allowed to drive home. You will need a responsible adult (18 years or older) to drive you home and stay with you that night.   If you are taking public transportation, you will need to have a responsible adult (18 years or older) with you. Please confirm with your physician that it is acceptable to use public transportation.   Please call the [place] Dept. at [phone] if you have any questions about these instructions.  Surgery Visitation Policy:  Patients undergoing a surgery or procedure may have one family member or support person with them as long as that person is not COVID-19 positive or experiencing its symptoms.  That person may remain in the waiting area during the procedure.

## 2020-11-17 NOTE — Telephone Encounter (Signed)
Request for pre-operative cardiac clearance Received: Today Kelli Kitchens, NP  P Cv Div Preop Callback Request for pre-operative cardiac clearance:     NOTE: Please review ECG performed in PAT on 11/17/2020 and comment on any significant changes noted.   1. What type of surgery is being performed?  INSERTION OF HERO VASCULAR ACCESS DEVICE (GRAFT)   2. When is this surgery scheduled?  11/24/2020     3. Are there any medications that need to be held prior to surgery?  ASA per cardiology recommendations   4. Practice name and name of physician performing surgery?  Performing surgeon: Dr. Hortencia Pilar, MD  Requesting clearance: Honor Loh, FNP-C       5. Anesthesia type (none, local, MAC, general)? General   6. What is the office phone and fax number?    Phone: [phone]  Fax: [phone]   ATTENTION: Unable to create telephone message as per your standard workflow. Directed by [facility] providers to send requests for cardiac clearance to this pool for appropriate distribution to provider covering pre-operative clearances.   Honor Loh, MSN, APRN, FNP-C, CEN  [facility]  Peri-operative Services Nurse Practitioner  Phone: [phone]  11/17/20 1:49 PM

## 2020-11-20 ENCOUNTER — Encounter: Payer: Self-pay | Admitting: Vascular Surgery

## 2020-11-20 NOTE — Telephone Encounter (Signed)
If possible, I recommend continuation of aspirin 81 mg daily in the perioperative period.  If it must be held due to high risk for bleeding, aspirin should be stopped 5 days before the procedure and restarted as soon as it is safe to do so.  Nelva Bush, MD [facility]

## 2020-11-20 NOTE — Telephone Encounter (Signed)
   Primary Cardiologist: Nelva Bush, MD  Chart reviewed as part of pre-operative protocol coverage. Given past medical history and time since last visit, based on ACC/AHA guidelines, Kelli Nguyen would be at acceptable risk for the planned procedure without further cardiovascular testing.   Per Dr. Saunders Revel: If possible, I recommend continuation of aspirin 81 mg daily in the perioperative period.  If it must be held due to high risk for bleeding, aspirin should be stopped 5 days before the procedure and restarted as soon as it is safe to do so.  Patient was advised that if she develops new symptoms prior to surgery to contact our office to arrange a follow-up appointment.  She verbalized understanding.  I will route this recommendation to the requesting party via Epic fax function and remove from pre-op pool.  Please call with questions.  Jossie Ng. Mikhaila Roh NP-C    11/20/2020, 11:47 AM [place] [address] Office [phone] Fax [phone]

## 2020-11-21 ENCOUNTER — Encounter: Payer: Self-pay | Admitting: Vascular Surgery

## 2020-11-21 NOTE — Progress Notes (Signed)
Perioperative Services  Pre-Admission/Anesthesia Testing Clinical Review  Date: 11/21/20  Patient Demographics:  Name: Kelli Nguyen DOB:   Jun 27, 1945 MRN:   [id number]  Planned Surgical Procedure(s):    Case: [id number] Date/Time: 11/24/20 0715   Procedure: INSERTION OF HERO VASCULAR ACCESS DEVICE (GRAFT) (Right)   Anesthesia type: General   Pre-op diagnosis: ESRD   Location: [place] [facility] [place] [facility]   Surgeons: Katha Cabal, MD     NOTE: Available PAT nursing documentation and vital signs have been reviewed. Clinical nursing staff has updated patient's PMH/PSHx, current medication list, and drug allergies/intolerances to ensure comprehensive history available to assist in medical decision making as it pertains to the aforementioned surgical procedure and anticipated anesthetic course. Extensive review of available clinical information performed. [name] PMH and PSHx updated with any diagnoses/procedures that  may have been inadvertently omitted during her intake with the pre-admission testing department's nursing staff.  Clinical Discussion:  Kelli Nguyen is a 75 y.o. female who is submitted for pre-surgical anesthesia review and clearance prior to her undergoing the above procedure. Patient has never been a smoker. Pertinent PMH includes: CAD, pulmonary hypertension, PAD, RBBB, murmur, G2DD, HTN, HLD, T2DM, secondary hyperparathyroidism, ESRD on dialysis, GERD (on daily PPI), anemia, OA, polyneuropathy  Patient is followed by cardiology (End, MD). She was last seen in the cardiology clinic on 08/21/2020; notes reviewed.  At the time of her clinic visit, patient doing well overall from a cardiovascular perspective.  She denied any chest pain, shortness of breath, PND, orthopnea, palpitations, vertiginous symptoms, or presyncope/syncope.  Patient with chronic bilateral lower extremity edema.  Additionally, patient with complaints of LEFT lower extremity  pain following an injury to her hip in 2011; requires rolling walker for ambulation.  PMH significant for cardiovascular diagnoses.  Patient underwent a three-vessel CABG at [facility] in 2008 with Dr. Meade Maw, MD. records regarding this procedure are limited, and were therefore adapted from current cardiologist's documentation.  LIMA-LAD, SVG-OM1, and SVG-PDA bypass grafts were placed.  Myocardial perfusion imaging study performed on 12/01/2013 revealed a moderate in size, severe, partially reversible perfusion defect in the apical, apical inferior, and apical lateral segments consistent with scar and peri-infarct ischemia.  Systolic function was globally normal with an EF of >65%.  TTE performed on 12/03/2013 revealed an LVEF of 55%.  There were regional wall motion abnormalities, LVH, segmental contractile left ventricular dysfunction, degenerative mitral valve disease, a dilated left atrium, and aortic sclerosis without evidence of stenosis.  Diagnostic left heart catheterization was performed on 02/26/2014 revealing multivessel native CAD; 70% stenosis in the distal LM, 80% stenosis in the proximal LAD, 70-80% stenosis in the mid LCx, and chronic CTO of the RCA.  SVG-OM1, and SVG-PDA bypass grafts were patent; LIMA-LAD graft with 80-90% stenosis at the anastomosis and also and 80-90% stenosis in the distal LAD after the anastomosis.  LVEDP elevated at 16 mmHg.  Last TTE was performed on 07/02/2017 revealing a normal left ventricular systolic function with an EF of >55%.  There was mild to moderate tricuspid regurgitation, mild mitral valve regurgitation, and a moderate elevated PASP of 34 mmHg (see full interpretation of cardiovascular testing below).  Blood pressure well controlled at 114/68; takes no medications.  Patient is on a statin for her HLD.  P T2DM well controlled with diet lifestyle modification; last Hgb A1c 5.2% when checked on 11/13/2020. Functional capacity, as defined by DASI, is  documented as being </= 4 METS.  No changes were made to patient's medication regimen.  Given patient's symptoms and exam, the decision was made to repeat her noninvasive studies (MPI and TTE) to assess for functional abnormalities and underlying ischemia.  Since being seen by cardiology, patient has undergone the recommended cardiovascular testing.  Myocardial perfusion imaging study performed on 09/20/2020 revealed a hyperdynamic LVEF of >65% and no evidence of stress-induced myocardial ischemia or arrhythmia.  Subsequent TTE revealed an LVEF of 55 to 60%, G2DD, and an elevated PASP of 40.7 mm/Hg. Patient to follow-up with outpatient cardiology in 6 months or sooner if needed.  Patient is scheduled for a HERO graft insertion on 11/24/2020 with Dr. Hortencia Pilar, MD.  Given patient's past medical history significant for cardiovascular diagnoses, presurgical cardiac clearance was sought by the PAT team.  Per cardiology, "there is some stiffening of the LV and some mild elevation in her PA pressures, which can be seen in patients with kidney failure and diabetes.  Her stress test does not show evidence of a significant blockage.  Overall the findings are reassuring.  I think it is reasonable for her to proceed with HERO graft placement without additional cardiovascular testing or intervention". This patient is on daily antiplatelet therapy.  Cardiology recommended that patient continue this medication throughout the perioperative period.  This has been discussed with vascular surgery who has cleared patient to continue her aspirin.  Patient denies previous perioperative complications with anesthesia in the past. In review of the available records, it is noted that patient underwent a general anesthetic course here (ASA III) in 01/2019 without documented complications.   Vitals with BMI 11/17/2020 08/21/2020 07/17/2020  Height 4\' 11"  4\' 11"  4\' 11"   Weight 161 lbs 166 lbs 165 lbs  BMI 32.5 79.39 03.00   Systolic - 923 300  Diastolic - 68 69  Pulse - 67 67    Providers/Specialists:   NOTE: Primary physician provider listed below. Patient may have been seen by APP or partner within same practice.   PROVIDER ROLE / SPECIALTY LAST OV  Schnier, Dolores Lory, MD Vascular Surgery 07/17/2020  Crissie Figures, PA-C Primary Care Provider ???  End, Harrell Gave, MD Cardiology 08/21/2020  Trinda Pascal, MD Nephrology ???   Allergies:  Penicillins, Codeine, Codeine sulfate, Contrast media [iodinated diagnostic agents], Gadobutrol, Gadolinium derivatives, Metrizamide, and Statins  Current Home Medications:   No current facility-administered medications for this encounter.    acetaminophen (TYLENOL) 500 MG tablet   aspirin 81 MG tablet   cephALEXin (KEFLEX) 500 MG capsule   cetirizine (ZYRTEC) 10 MG tablet   cinacalcet (SENSIPAR) 30 MG tablet   docusate sodium (COLACE) 100 MG capsule   latanoprost (XALATAN) 0.005 % ophthalmic solution   pantoprazole (PROTONIX) 40 MG tablet   pravastatin (PRAVACHOL) 20 MG tablet   sevelamer carbonate (RENVELA) 800 MG tablet   Timolol Maleate 0.5 % (DAILY) SOLN   History:   Past Medical History:  Diagnosis Date   Anemia    Carpal tunnel syndrome of left wrist    Cataract, bilateral    Chronic cough    Closed displaced fracture of left femoral neck ([place]) 03/09/2017   Coronary artery disease    s/p 3v CABG (LIMA-LAD, SVG-OM1, SVG-PDA) in 2008   Diabetes mellitus without complication (HCC)    ESRD (end stage renal disease) on dialysis (HCC)    T-TH-SAT   GERD (gastroesophageal reflux disease)    Grade II diastolic dysfunction    HLD (hyperlipidemia)    Hypertension    Murmur  OA (osteoarthritis)    PAD (peripheral artery disease) (HCC)    Pneumonia    Polyneuropathy    feet and hands   Pulmonary hypertension (HCC)    RBBB    Secondary hyperparathyroidism of renal origin ([place])    Sepsis ([place]) 06/28/2017   Wears dentures    partial upper and  lower   Past Surgical History:  Procedure Laterality Date   A/V SHUNT INTERVENTION N/A 10/16/2016   Procedure: A/V Shunt Intervention;  Surgeon: Katha Cabal, MD;  Location: [place] [facility];  Service: Cardiovascular;  Laterality: N/A;   A/V SHUNTOGRAM Left 10/16/2016   Procedure: A/V Shuntogram;  Surgeon: Katha Cabal, MD;  Location: [place] [facility];  Service: Cardiovascular;  Laterality: Left;   ABDOMINAL HYSTERECTOMY     CARDIAC CATHETERIZATION N/A 02/26/2014   CARPAL TUNNEL RELEASE Left 09/27/2015   Procedure: CARPAL TUNNEL RELEASE;  Surgeon: Leanor Kail, MD;  Location: [facility];  Service: Orthopedics;  Laterality: Left;   CATARACT EXTRACTION W/PHACO Right 09/23/2016   Procedure: CATARACT EXTRACTION PHACO AND INTRAOCULAR LENS PLACEMENT (IOC) complicated diabetic right;  Surgeon: Ronnell Freshwater, MD;  Location: [place];  Service: Ophthalmology;  Laterality: Right;  Diabetic - diet controlled Potassium draw before surgery   CATARACT EXTRACTION W/PHACO Left 01/20/2017   Procedure: CATARACT EXTRACTION PHACO AND INTRAOCULAR LENS PLACEMENT ([place]) LEFT DIABETIC;  Surgeon: Eulogio Bear, MD;  Location: [place];  Service: Ophthalmology;  Laterality: Left;  Diabetic - diet controlled Potassium draw prior to procedure  8:00 ARRIVAL   COLONOSCOPY     COLONOSCOPY WITH PROPOFOL N/A 11/28/2014   Procedure: COLONOSCOPY WITH PROPOFOL;  Surgeon: Manya Silvas, MD;  [facility];  Service: Endoscopy;  Laterality: N/A;   CORONARY ARTERY BYPASS GRAFT N/A 2008   3v; LIMA-LAD, SVG-OM1, SVG-PDA; Location: [facility]; Surgeon: Jannett Celestine, MD   DIALYSIS/PERMA CATHETER INSERTION Left 01/21/2019   Procedure: DIALYSIS/PERMA CATHETER EXCHANGE;  Surgeon: Algernon Huxley, MD;  Location: [place] [facility];  Service: Cardiovascular;  Laterality: Left;   HEMIARTHROPLASTY HIP Left 2018   INSERTION OF DIALYSIS CATHETER Left 01/19/2019   Procedure:  INSERTION OF DIALYSIS CATHETER;  Surgeon: Katha Cabal, MD;  Location: [facility];  Service: Vascular;  Laterality: Left;   LIGATIONS OF HERO GRAFT Left 01/19/2019   Procedure: EXCISION LEFT HERO GRAFT;  Surgeon: Katha Cabal, MD;  Location: [facility];  Service: Vascular;  Laterality: Left;   NEPHRECTOMY Left    PERIPHERAL VASCULAR CATHETERIZATION Left 01/18/2015   Procedure: A/V Shuntogram/Fistulagram;  Surgeon: Katha Cabal, MD;  Location: [place] [facility];  Service: Cardiovascular;  Laterality: Left;   PERIPHERAL VASCULAR CATHETERIZATION Left 01/18/2015   Procedure: A/V Shunt Intervention;  Surgeon: Katha Cabal, MD;  Location: [place] [facility];  Service: Cardiovascular;  Laterality: Left;   PERIPHERAL VASCULAR CATHETERIZATION N/A 08/10/2015   Procedure: Thrombectomy;  Surgeon: Algernon Huxley, MD;  Location: [place] [facility];  Service: Cardiovascular;  Laterality: N/A;   PERIPHERAL VASCULAR CATHETERIZATION N/A 08/10/2015   Procedure: A/V Shuntogram/Fistulagram;  Surgeon: Algernon Huxley, MD;  Location: [place] [facility];  Service: Cardiovascular;  Laterality: N/A;   PERIPHERAL VASCULAR CATHETERIZATION N/A 08/10/2015   Procedure: A/V Shunt Intervention;  Surgeon: Algernon Huxley, MD;  Location: [place] [facility];  Service: Cardiovascular;  Laterality: N/A;   PERIPHERAL VASCULAR THROMBECTOMY Left 01/12/2019   Procedure: PERIPHERAL VASCULAR THROMBECTOMY;  Surgeon: Katha Cabal, MD;  Location: [place] [facility];  Service: Cardiovascular;  Laterality: Left;   REMOVAL OF A DIALYSIS CATHETER Right 01/19/2019   Procedure: REMOVAL OF A DIALYSIS CATHETER;  Surgeon: Katha Cabal, MD;  Location: [facility];  Service: Vascular;  Laterality: Right;   RENAL SHUNTS     UPPER EXTREMITY VENOGRAPHY Right 03/20/2020   Procedure: UPPER EXTREMITY VENOGRAPHY;  Surgeon: Algernon Huxley, MD;  Location: [place] [facility];  Service: Cardiovascular;  Laterality: Right;   Family  History  Problem Relation Age of Onset   Hypertension Sister    Social History   Tobacco Use   Smoking status: Never   Smokeless tobacco: Never  Vaping Use   Vaping Use: Never used  Substance Use Topics   Alcohol use: No   Drug use: No    Pertinent Clinical Results:  LABS: Labs reviewed: Acceptable for surgery.  No visits with results within 3 Day(s) from this visit.  Latest known visit with results is:  Hospital Outpatient Visit on 11/17/2020  Component Date Value Ref Range Status   aPTT 11/17/2020 37 (A) 24 - 36 seconds Final   Comment:        IF BASELINE aPTT IS ELEVATED, SUGGEST PATIENT RISK ASSESSMENT BE USED TO DETERMINE APPROPRIATE ANTICOAGULANT THERAPY. Performed at [facility], [address] [place], [place] [address]    WBC 11/17/2020 8.2  4.0 - 10.5 K/uL Final   RBC 11/17/2020 3.75 (A) 3.87 - 5.11 MIL/uL Final   Hemoglobin 11/17/2020 10.5 (A) 12.0 - 15.0 g/dL Final   HCT 11/17/2020 32.7 (A) 36.0 - 46.0 % Final   MCV 11/17/2020 87.2  80.0 - 100.0 fL Final   MCH 11/17/2020 28.0  26.0 - 34.0 pg Final   MCHC 11/17/2020 32.1  30.0 - 36.0 g/dL Final   RDW 11/17/2020 16.4 (A) 11.5 - 15.5 % Final   Platelets 11/17/2020 231  150 - 400 K/uL Final   nRBC 11/17/2020 0.0  0.0 - 0.2 % Final   Neutrophils Relative % 11/17/2020 60  % Final   Neutro Abs 11/17/2020 4.9  1.7 - 7.7 K/uL Final   Lymphocytes Relative 11/17/2020 25  % Final   Lymphs Abs 11/17/2020 2.1  0.7 - 4.0 K/uL Final   Monocytes Relative 11/17/2020 10  % Final   Monocytes Absolute 11/17/2020 0.8  0.1 - 1.0 K/uL Final   Eosinophils Relative 11/17/2020 4  % Final   Eosinophils Absolute 11/17/2020 0.4  0.0 - 0.5 K/uL Final   Basophils Relative 11/17/2020 1  % Final   Basophils Absolute 11/17/2020 0.0  0.0 - 0.1 K/uL Final   Immature Granulocytes 11/17/2020 0  % Final   Abs Immature Granulocytes 11/17/2020 0.02  0.00 - 0.07 K/uL Final   Performed at [facility], [place], [place] [address]   Sodium 11/17/2020 141  135 - 145 mmol/L Final   Potassium 11/17/2020 3.7  3.5 - 5.1 mmol/L Final   Chloride 11/17/2020 100  98 - 111 mmol/L Final   CO2 11/17/2020 32  22 - 32 mmol/L Final   Glucose, Bld 11/17/2020 122 (A) 70 - 99 mg/dL Final   Glucose reference range applies only to samples taken after fasting for at least 8 hours.   BUN 11/17/2020 30 (A) 8 - 23 mg/dL Final   Creatinine, Ser 11/17/2020 6.08 (A) 0.44 - 1.00 mg/dL Final   Calcium 11/17/2020 8.5 (A) 8.9 - 10.3 mg/dL Final   GFR, Estimated 11/17/2020 7 (A) >60 mL/min Final  Comment: (NOTE) Calculated using the CKD-EPI Creatinine Equation (2021)    Anion gap 11/17/2020 9  5 - 15 Final   Performed at [facility], [address] [place], [place] [address]   ABO/RH(D) 11/17/2020 A POS   Final   Antibody Screen 11/17/2020 POS   Final   Sample Expiration 11/17/2020 12/01/2020,2359   Final   Extend sample reason 11/17/2020    Final                   Value:NO TRANSFUSIONS OR PREGNANCY IN THE PAST 3 MONTHS Performed at [facility], [address] [place], [place] [address]    Antibody Identification 29/92/4268 PENDING   Incomplete   Prothrombin Time 11/17/2020 13.9  11.4 - 15.2 seconds Final   INR 11/17/2020 1.1  0.8 - 1.2 Final   Comment: (NOTE) INR goal varies based on device and disease states. Performed at [facility], [address] [place], [place] [address]     ECG: Date: 11/17/2020 Time ECG obtained: 1334 PM Rate: 70 bpm Rhythm:  Sinus rhythm with occasional PVCs; RBBB Axis (leads I and aVF): Left axis deviation Intervals: PR 196 ms. QRS 122 ms. QTc 457 ms. ST segment and T wave changes: No evidence of acute ST segment elevation or depression; evidence of age undetermined inferior infarct Comparison: Similar to previous tracing obtained on 01/19/2019   IMAGING / PROCEDURES: LEXISCAN performed on 09/20/2020 LVEF hyperdynamic at greater than 65% No  evidence of stress-induced myocardial ischemia or arrhythmia Normal low risk study  TRANSTHORACIC ECHOCARDIOGRAM performed on 09/20/2020 Left ventricular ejection fraction, by estimation, is 55 to 60%. The  left ventricle has normal function. The left ventricle has no regional wall motion abnormalities. Left ventricular diastolic parameters are consistent with Grade II diastolic dysfunction (pseudonormalization).  Right ventricular systolic function is low normal. The right ventricular size is normal. There is moderately elevated pulmonary artery systolic pressure. The estimated right ventricular systolic pressure is 22.2 mmHg.  The mitral valve was not well visualized. No evidence of mitral valve regurgitation.  The aortic valve was not well visualized. Aortic valve regurgitation is not visualized.  The inferior vena cava is normal in size with greater than 50% respiratory variability, suggesting right atrial pressure of 3 mmHg.  LEFT HEART CATHETERIZATION AND CORONARY ANGIOGRAPHY performed on 02/26/2014 Multivessel native CAD including 70% stenosis in the distal LM, 80% stenosis of proximal LAD, 70-80% stenosis in the mid LCx, and chronic CTO in the RCA. SVG-OM1 and SVG-PDA bypass grafts widely patent LIMA-LAD appears to have an 80-90% stenosis at the anastomosis, and also and 80-90% stenosis at the distal LAD after the anastomosis. Elevated LVEDP at 16 mmHg Very tortuous left subclavian artery with a LIMA takeoff; very difficult engagement of LIMA via femoral approach Recommendations: Optimize medical therapy  Impression and Plan:  LELAH RENNAKER has been referred for pre-anesthesia review and clearance prior to her undergoing the planned anesthetic and procedural courses. Available labs, pertinent testing, and imaging results were personally reviewed by me. This patient has been appropriately cleared by cardiology with an overall ACCEPTABLE risk of significant perioperative cardiovascular  complications.  Based on clinical review performed today (11/21/20), barring any significant acute changes in the patient's overall condition, it is anticipated that she will be able to proceed with the planned surgical intervention. Any acute changes in clinical condition may necessitate her procedure being postponed and/or cancelled. Patient will meet with anesthesia team (MD and/or CRNA) on the day of  her procedure for preoperative evaluation/assessment. Questions regarding anesthetic course will be fielded at that time.   Pre-surgical instructions were reviewed with the patient during her PAT appointment and questions were fielded by PAT clinical staff. Patient was advised that if any questions or concerns arise prior to her procedure then she should return a call to PAT and/or her surgeon's office to discuss.  Honor Loh, MSN, APRN, FNP-C, CEN [facility]  Peri-operative Services Nurse Practitioner Phone: [phone] Fax: [phone] 11/21/20 10:30 AM  NOTE: This note has been prepared using Dragon dictation software. Despite my best ability to proofread, there is always the potential that unintentional transcriptional errors may still occur from this process.

## 2020-11-24 ENCOUNTER — Other Ambulatory Visit: Payer: Self-pay

## 2020-11-24 ENCOUNTER — Ambulatory Visit: Payer: Medicare Other | Admitting: Urgent Care

## 2020-11-24 ENCOUNTER — Encounter
Admission: RE | Disposition: A | Discharge status: Home / Self Care | Payer: Self-pay | Source: Home / Self Care | Attending: Vascular Surgery

## 2020-11-24 ENCOUNTER — Encounter: Payer: Self-pay | Admitting: Vascular Surgery

## 2020-11-24 ENCOUNTER — Ambulatory Visit
Admission: RE | Admit: 2020-11-24 | Discharge: 2020-11-24 | Disposition: A | Discharge status: Home / Self Care | Payer: Medicare Other | Attending: Vascular Surgery | Admitting: Vascular Surgery

## 2020-11-24 ENCOUNTER — Ambulatory Visit: Payer: Medicare Other

## 2020-11-24 DIAGNOSIS — I25119 Atherosclerotic heart disease of native coronary artery with unspecified angina pectoris: Secondary | ICD-10-CM

## 2020-11-24 DIAGNOSIS — T82510A Breakdown (mechanical) of surgically created arteriovenous fistula, initial encounter: Secondary | ICD-10-CM | POA: Diagnosis present

## 2020-11-24 DIAGNOSIS — Z888 Allergy status to other drugs, medicaments and biological substances status: Secondary | ICD-10-CM | POA: Insufficient documentation

## 2020-11-24 DIAGNOSIS — I871 Compression of vein: Secondary | ICD-10-CM | POA: Insufficient documentation

## 2020-11-24 DIAGNOSIS — E1122 Type 2 diabetes mellitus with diabetic chronic kidney disease: Secondary | ICD-10-CM | POA: Insufficient documentation

## 2020-11-24 DIAGNOSIS — T8241XS Breakdown (mechanical) of vascular dialysis catheter, sequela: Secondary | ICD-10-CM | POA: Diagnosis not present

## 2020-11-24 DIAGNOSIS — Z79899 Other long term (current) drug therapy: Secondary | ICD-10-CM | POA: Diagnosis not present

## 2020-11-24 DIAGNOSIS — N186 End stage renal disease: Secondary | ICD-10-CM | POA: Diagnosis not present

## 2020-11-24 DIAGNOSIS — Y841 Kidney dialysis as the cause of abnormal reaction of the patient, or of later complication, without mention of misadventure at the time of the procedure: Secondary | ICD-10-CM | POA: Diagnosis not present

## 2020-11-24 DIAGNOSIS — Z7982 Long term (current) use of aspirin: Secondary | ICD-10-CM | POA: Diagnosis not present

## 2020-11-24 DIAGNOSIS — Z880 Allergy status to penicillin: Secondary | ICD-10-CM | POA: Diagnosis not present

## 2020-11-24 DIAGNOSIS — Z885 Allergy status to narcotic agent status: Secondary | ICD-10-CM | POA: Diagnosis not present

## 2020-11-24 DIAGNOSIS — Z91041 Radiographic dye allergy status: Secondary | ICD-10-CM | POA: Insufficient documentation

## 2020-11-24 DIAGNOSIS — Z992 Dependence on renal dialysis: Secondary | ICD-10-CM | POA: Insufficient documentation

## 2020-11-24 DIAGNOSIS — Z0000 Encounter for general adult medical examination without abnormal findings: Secondary | ICD-10-CM

## 2020-11-24 DIAGNOSIS — I120 Hypertensive chronic kidney disease with stage 5 chronic kidney disease or end stage renal disease: Secondary | ICD-10-CM | POA: Diagnosis not present

## 2020-11-24 HISTORY — DX: Other ill-defined heart diseases: I51.89

## 2020-11-24 HISTORY — DX: End stage renal disease: N18.6

## 2020-11-24 HISTORY — DX: Polyneuropathy, unspecified: G62.9

## 2020-11-24 HISTORY — DX: Unspecified right bundle-branch block: I45.10

## 2020-11-24 HISTORY — DX: Unspecified osteoarthritis, unspecified site: M19.90

## 2020-11-24 HISTORY — DX: Secondary hyperparathyroidism of renal origin: N25.81

## 2020-11-24 HISTORY — PX: VASCULAR ACCESS DEVICE INSERTION: SHX5158

## 2020-11-24 HISTORY — DX: Hyperlipidemia, unspecified: E78.5

## 2020-11-24 HISTORY — DX: Peripheral vascular disease, unspecified: I73.9

## 2020-11-24 HISTORY — DX: Cardiac murmur, unspecified: R01.1

## 2020-11-24 HISTORY — DX: Pulmonary hypertension, unspecified: I27.20

## 2020-11-24 LAB — POCT I-STAT, CHEM 8
BUN: 29 mg/dL — ABNORMAL HIGH (ref 8–23)
Calcium, Ion: 1.06 mmol/L — ABNORMAL LOW (ref 1.15–1.40)
Chloride: 99 mmol/L (ref 98–111)
Creatinine, Ser: 5.9 mg/dL — ABNORMAL HIGH (ref 0.44–1.00)
Glucose, Bld: 124 mg/dL — ABNORMAL HIGH (ref 70–99)
HCT: 37 % (ref 36.0–46.0)
Hemoglobin: 12.6 g/dL (ref 12.0–15.0)
Potassium: 3.7 mmol/L (ref 3.5–5.1)
Sodium: 139 mmol/L (ref 135–145)
TCO2: 31 mmol/L (ref 22–32)

## 2020-11-24 LAB — GLUCOSE, CAPILLARY: Glucose-Capillary: 143 mg/dL — ABNORMAL HIGH (ref 70–99)

## 2020-11-24 SURGERY — INSERTION, CATHETER, HERO
Anesthesia: General | Laterality: Right

## 2020-11-24 MED ORDER — VANCOMYCIN HCL IN DEXTROSE 1-5 GM/200ML-% IV SOLN
INTRAVENOUS | Status: AC
  Administered 2020-11-24: 1000 mg via INTRAVENOUS
  Filled 2020-11-24: qty 200

## 2020-11-24 MED ORDER — ONDANSETRON HCL 4 MG/2ML IJ SOLN: 4.0000 mg | Freq: Once | INTRAMUSCULAR | Status: DC | PRN

## 2020-11-24 MED ORDER — SUGAMMADEX SODIUM 200 MG/2ML IV SOLN
INTRAVENOUS | Status: DC | PRN
  Administered 2020-11-24: 400 mg via INTRAVENOUS

## 2020-11-24 MED ORDER — HEPARIN SODIUM (PORCINE) 5000 UNIT/ML IJ SOLN
INTRAMUSCULAR | Status: AC
  Filled 2020-11-24: qty 1

## 2020-11-24 MED ORDER — ALBUMIN HUMAN 5 % IV SOLN
INTRAVENOUS | Status: AC
  Administered 2020-11-24: 12.5 g via INTRAVENOUS
  Filled 2020-11-24: qty 250

## 2020-11-24 MED ORDER — LIDOCAINE HCL (CARDIAC) PF 100 MG/5ML IV SOSY
PREFILLED_SYRINGE | INTRAVENOUS | Status: DC | PRN
  Administered 2020-11-24: 50 mg via INTRAVENOUS

## 2020-11-24 MED ORDER — KETAMINE HCL 10 MG/ML IJ SOLN
INTRAMUSCULAR | Status: DC | PRN
  Administered 2020-11-24 (×2): 10 mg via INTRAVENOUS
  Administered 2020-11-24: 30 mg via INTRAVENOUS

## 2020-11-24 MED ORDER — HEPARIN SODIUM (PORCINE) 1000 UNIT/ML IJ SOLN
INTRAMUSCULAR | Status: DC | PRN
  Administered 2020-11-24: 3000 [IU] via INTRAVENOUS

## 2020-11-24 MED ORDER — HYDROCODONE-ACETAMINOPHEN 5-325 MG PO TABS: 1.0000 | ORAL_TABLET | Freq: Four times a day (QID) | ORAL | 0 refills | Status: DC | PRN

## 2020-11-24 MED ORDER — PROPOFOL 10 MG/ML IV BOLUS
INTRAVENOUS | Status: AC
  Filled 2020-11-24: qty 20

## 2020-11-24 MED ORDER — VISTASEAL 10 ML SINGLE DOSE KIT
PACK | CUTANEOUS | Status: DC | PRN
  Administered 2020-11-24: 10 mL via TOPICAL

## 2020-11-24 MED ORDER — MIDAZOLAM HCL 2 MG/2ML IJ SOLN
INTRAMUSCULAR | Status: AC
  Filled 2020-11-24: qty 2

## 2020-11-24 MED ORDER — VASOPRESSIN 20 UNIT/ML IV SOLN
INTRAVENOUS | Status: DC | PRN
  Administered 2020-11-24: 12 [IU] via INTRAVENOUS
  Administered 2020-11-24 (×2): 1 [IU] via INTRAVENOUS

## 2020-11-24 MED ORDER — ONDANSETRON HCL 4 MG/2ML IJ SOLN
INTRAMUSCULAR | Status: DC | PRN
  Administered 2020-11-24: 4 mg via INTRAVENOUS

## 2020-11-24 MED ORDER — VANCOMYCIN HCL IN DEXTROSE 1-5 GM/200ML-% IV SOLN: 1000.0000 mg | INTRAVENOUS | Status: AC

## 2020-11-24 MED ORDER — DEXAMETHASONE SODIUM PHOSPHATE 10 MG/ML IJ SOLN
INTRAMUSCULAR | Status: DC | PRN
Start: 2020-11-24 — End: 2020-11-24
  Administered 2020-11-24: 10 mg via INTRAVENOUS

## 2020-11-24 MED ORDER — VISTASEAL 10 ML SINGLE DOSE KIT
PACK | CUTANEOUS | Status: AC
  Filled 2020-11-24: qty 10

## 2020-11-24 MED ORDER — BUPIVACAINE HCL (PF) 0.5 % IJ SOLN
INTRAMUSCULAR | Status: AC
  Filled 2020-11-24: qty 30

## 2020-11-24 MED ORDER — HEMOSTATIC AGENTS (NO CHARGE) OPTIME
TOPICAL | Status: DC | PRN
  Administered 2020-11-24: 1 via TOPICAL

## 2020-11-24 MED ORDER — ROCURONIUM BROMIDE 100 MG/10ML IV SOLN
INTRAVENOUS | Status: DC | PRN
  Administered 2020-11-24: 20 mg via INTRAVENOUS
  Administered 2020-11-24: 10 mg via INTRAVENOUS
  Administered 2020-11-24: 50 mg via INTRAVENOUS
  Administered 2020-11-24: 20 mg via INTRAVENOUS

## 2020-11-24 MED ORDER — BUPIVACAINE LIPOSOME 1.3 % IJ SUSP
INTRAMUSCULAR | Status: AC
  Filled 2020-11-24: qty 20

## 2020-11-24 MED ORDER — PHENYLEPHRINE HCL (PRESSORS) 10 MG/ML IV SOLN
INTRAVENOUS | Status: AC
  Filled 2020-11-24: qty 1

## 2020-11-24 MED ORDER — CHLORHEXIDINE GLUCONATE 0.12 % MT SOLN
OROMUCOSAL | Status: AC
  Administered 2020-11-24: 15 mL via OROMUCOSAL
  Filled 2020-11-24: qty 15

## 2020-11-24 MED ORDER — EPHEDRINE SULFATE 50 MG/ML IJ SOLN
INTRAMUSCULAR | Status: DC | PRN
  Administered 2020-11-24: 10 mg via INTRAVENOUS
  Administered 2020-11-24: 5 mg via INTRAVENOUS
  Administered 2020-11-24: 10 mg via INTRAVENOUS

## 2020-11-24 MED ORDER — ALBUMIN HUMAN 5 % IV SOLN: 12.5000 g | Freq: Once | INTRAVENOUS | Status: AC

## 2020-11-24 MED ORDER — CHLORHEXIDINE GLUCONATE CLOTH 2 % EX PADS: 6.0000 | MEDICATED_PAD | Freq: Once | CUTANEOUS | Status: DC

## 2020-11-24 MED ORDER — SODIUM CHLORIDE 0.9 % IV SOLN
INTRAVENOUS | Status: DC | PRN
  Administered 2020-11-24: 30 ug/min via INTRAVENOUS

## 2020-11-24 MED ORDER — SODIUM CHLORIDE 0.9 % IV SOLN: INTRAVENOUS | Status: DC | PRN

## 2020-11-24 MED ORDER — ACETAMINOPHEN 10 MG/ML IV SOLN
INTRAVENOUS | Status: DC | PRN
  Administered 2020-11-24: 1000 mg via INTRAVENOUS

## 2020-11-24 MED ORDER — ORAL CARE MOUTH RINSE: 15.0000 mL | Freq: Once | OROMUCOSAL | Status: AC

## 2020-11-24 MED ORDER — ACETAMINOPHEN 10 MG/ML IV SOLN
INTRAVENOUS | Status: AC
  Filled 2020-11-24: qty 100

## 2020-11-24 MED ORDER — BUPIVACAINE HCL (PF) 0.5 % IJ SOLN
INTRAMUSCULAR | Status: DC | PRN
  Administered 2020-11-24: 30 mL

## 2020-11-24 MED ORDER — BUPIVACAINE LIPOSOME 1.3 % IJ SUSP
INTRAMUSCULAR | Status: DC | PRN
  Administered 2020-11-24: 20 mL

## 2020-11-24 MED ORDER — MIDAZOLAM HCL 2 MG/2ML IJ SOLN
INTRAMUSCULAR | Status: DC | PRN
  Administered 2020-11-24: 2 mg via INTRAVENOUS

## 2020-11-24 MED ORDER — FENTANYL CITRATE (PF) 100 MCG/2ML IJ SOLN
INTRAMUSCULAR | Status: DC | PRN
  Administered 2020-11-24: 25 ug via INTRAVENOUS
  Administered 2020-11-24: 50 ug via INTRAVENOUS
  Administered 2020-11-24: 25 ug via INTRAVENOUS

## 2020-11-24 MED ORDER — PHENYLEPHRINE HCL (PRESSORS) 10 MG/ML IV SOLN
INTRAVENOUS | Status: DC | PRN
  Administered 2020-11-24 (×7): 100 ug via INTRAVENOUS
  Administered 2020-11-24: 200 ug via INTRAVENOUS

## 2020-11-24 MED ORDER — CHLORHEXIDINE GLUCONATE 0.12 % MT SOLN: 15.0000 mL | Freq: Once | OROMUCOSAL | Status: AC

## 2020-11-24 MED ORDER — FENTANYL CITRATE (PF) 100 MCG/2ML IJ SOLN: 25.0000 ug | INTRAMUSCULAR | Status: DC | PRN

## 2020-11-24 MED ORDER — PROPOFOL 10 MG/ML IV BOLUS
INTRAVENOUS | Status: DC | PRN
  Administered 2020-11-24: 80 mg via INTRAVENOUS

## 2020-11-24 MED ORDER — SODIUM CHLORIDE 0.9 % IV SOLN: INTRAVENOUS | Status: DC

## 2020-11-24 MED ORDER — KETAMINE HCL 50 MG/5ML IJ SOSY
PREFILLED_SYRINGE | INTRAMUSCULAR | Status: AC
  Filled 2020-11-24: qty 5

## 2020-11-24 MED ORDER — DIPHENHYDRAMINE HCL 50 MG/ML IJ SOLN
INTRAMUSCULAR | Status: DC | PRN
  Administered 2020-11-24: 50 mg via INTRAVENOUS

## 2020-11-24 MED ORDER — DIPHENHYDRAMINE HCL 50 MG/ML IJ SOLN
INTRAMUSCULAR | Status: AC
  Filled 2020-11-24: qty 1

## 2020-11-24 MED ORDER — SODIUM CHLORIDE 0.9 % IV SOLN
INTRAVENOUS | Status: DC | PRN
  Administered 2020-11-24: 100 mL via INTRAMUSCULAR

## 2020-11-24 MED ORDER — FENTANYL CITRATE (PF) 100 MCG/2ML IJ SOLN
INTRAMUSCULAR | Status: AC
  Filled 2020-11-24: qty 2

## 2020-11-24 SURGICAL SUPPLY — 89 items
ADH SKN CLS APL DERMABOND .7 (GAUZE/BANDAGES/DRESSINGS) ×2
APL PRP STRL LF DISP 70% ISPRP (MISCELLANEOUS) ×3
APPLIER CLIP 9.375 SM OPEN (CLIP)
APR CLP SM 9.3 20 MLT OPN (CLIP)
BAG DECANTER FOR FLEXI CONT (MISCELLANEOUS) ×2 IMPLANT
BALLN DORADO 10X80X80 (BALLOONS) ×2
BALLOON DORADO 10X80X80 (BALLOONS) ×1 IMPLANT
BLADE SURG 15 STRL LF DISP TIS (BLADE) ×1 IMPLANT
BLADE SURG 15 STRL SS (BLADE) ×2
BLADE SURG SZ11 CARB STEEL (BLADE) ×2 IMPLANT
BNDG ELASTIC 6X5.8 VLCR STR LF (GAUZE/BANDAGES/DRESSINGS) ×2 IMPLANT
BOOT SUTURE AID YELLOW STND (SUTURE) ×2 IMPLANT
BRUSH SCRUB EZ  4% CHG (MISCELLANEOUS) ×2
BRUSH SCRUB EZ 4% CHG (MISCELLANEOUS) ×2 IMPLANT
CANISTER SUCT 1200ML W/VALVE (MISCELLANEOUS) IMPLANT
CANNULA 5F STIFF (CANNULA) ×2 IMPLANT
CATH BEACON 5 .035 40 KMP TP (CATHETERS) ×1 IMPLANT
CATH BEACON 5 .038 40 KMP TP (CATHETERS) ×1
CHLORAPREP W/TINT 26 (MISCELLANEOUS) ×6 IMPLANT
CLIP APPLIE 9.375 SM OPEN (CLIP) IMPLANT
COMPONENT HERO ACCESSORY KIT (VASCULAR PRODUCTS) ×2 IMPLANT
COMPONENT HERO ARTERIAL GRAFT (Vascular Products) ×2 IMPLANT
COMPONENT HERO VENOUS OVERFLOW (Vascular Products) ×2 IMPLANT
COVER PROBE FLX POLY STRL (MISCELLANEOUS) ×2 IMPLANT
DERMABOND ADVANCED (GAUZE/BANDAGES/DRESSINGS) ×2
DERMABOND ADVANCED .7 DNX12 (GAUZE/BANDAGES/DRESSINGS) ×2 IMPLANT
DEVICE TORQUE (MISCELLANEOUS) ×2 IMPLANT
DRAPE 3/4 80X56 (DRAPES) ×2 IMPLANT
DRAPE C-ARM XRAY 36X54 (DRAPES) ×2 IMPLANT
DRAPE INCISE IOBAN 66X45 STRL (DRAPES) ×2 IMPLANT
DRESSING SURGICEL FIBRLLR 1X2 (HEMOSTASIS) ×1 IMPLANT
DRSG OPSITE POSTOP 4X6 (GAUZE/BANDAGES/DRESSINGS) ×2 IMPLANT
DRSG SURGICEL FIBRILLAR 1X2 (HEMOSTASIS) ×2
ELECT CAUTERY BLADE 6.4 (BLADE) ×2 IMPLANT
ELECT REM PT RETURN 9FT ADLT (ELECTROSURGICAL) ×2
ELECTRODE REM PT RTRN 9FT ADLT (ELECTROSURGICAL) ×1 IMPLANT
GAUZE 4X4 16PLY ~~LOC~~+RFID DBL (SPONGE) ×6 IMPLANT
GLIDEWIRE STIFF .35X180X3 HYDR (WIRE) ×2 IMPLANT
GLOVE SURG ENC MOIS LTX SZ7 (GLOVE) ×4 IMPLANT
GLOVE SURG SYN 8.0 (GLOVE) ×6 IMPLANT
GLOVE SURG UNDER LTX SZ7.5 (GLOVE) ×4 IMPLANT
GOWN STRL REUS W/ TWL LRG LVL3 (GOWN DISPOSABLE) ×2 IMPLANT
GOWN STRL REUS W/ TWL XL LVL3 (GOWN DISPOSABLE) ×3 IMPLANT
GOWN STRL REUS W/TWL LRG LVL3 (GOWN DISPOSABLE) ×4
GOWN STRL REUS W/TWL XL LVL3 (GOWN DISPOSABLE) ×6
GUIDEWIRE SUPER STIFF .035X180 (WIRE) ×2 IMPLANT
IV NS 500ML (IV SOLUTION) ×2
IV NS 500ML BAXH (IV SOLUTION) ×1 IMPLANT
KIT ENCORE 26 ADVANTAGE (KITS) ×2 IMPLANT
KIT TURNOVER KIT A (KITS) ×2 IMPLANT
LABEL OR SOLS (LABEL) ×2 IMPLANT
LOOP RED MAXI  1X406MM (MISCELLANEOUS) ×1
LOOP VESSEL MAXI 1X406 RED (MISCELLANEOUS) ×1 IMPLANT
LOOP VESSEL MINI 0.8X406 BLUE (MISCELLANEOUS) ×1 IMPLANT
LOOPS BLUE MINI 0.8X406MM (MISCELLANEOUS) ×1
MANIFOLD NEPTUNE II (INSTRUMENTS) ×2 IMPLANT
NEEDLE FILTER BLUNT 18X 1/2SAF (NEEDLE) ×1
NEEDLE FILTER BLUNT 18X1 1/2 (NEEDLE) ×1 IMPLANT
NEEDLE HYPO 25X1 1.5 SAFETY (NEEDLE) IMPLANT
NS IRRIG 500ML POUR BTL (IV SOLUTION) ×2 IMPLANT
PACK ANGIOGRAPHY (CUSTOM PROCEDURE TRAY) ×2 IMPLANT
PACK BASIN MAJOR ARMC (MISCELLANEOUS) ×2 IMPLANT
PACK UNIVERSAL (MISCELLANEOUS) ×2 IMPLANT
PAD PREP 24X41 OB/GYN DISP (PERSONAL CARE ITEMS) ×2 IMPLANT
SHEATH BRITE TIP 8FRX11 (SHEATH) ×2 IMPLANT
SPONGE T-LAP 18X18 ~~LOC~~+RFID (SPONGE) ×4 IMPLANT
STOCKINETTE STRL 4IN 9604848 (GAUZE/BANDAGES/DRESSINGS) ×2 IMPLANT
SUT ETHIBOND 0 (SUTURE) ×2 IMPLANT
SUT GTX CV-6 30 (SUTURE) ×4 IMPLANT
SUT MNCRL 4-0 (SUTURE) ×2
SUT MNCRL 4-0 27XMFL (SUTURE) ×1
SUT MNCRL AB 4-0 PS2 18 (SUTURE) ×2 IMPLANT
SUT MNCRL+ 5-0 UNDYED PC-3 (SUTURE) ×1 IMPLANT
SUT MONOCRYL 5-0 (SUTURE) ×1
SUT PROLENE 6 0 BV (SUTURE) ×20 IMPLANT
SUT SILK 2 0 (SUTURE) ×2
SUT SILK 2 0 SH (SUTURE) ×2 IMPLANT
SUT SILK 2-0 18XBRD TIE 12 (SUTURE) ×1 IMPLANT
SUT SILK 3 0 (SUTURE) ×2
SUT SILK 3-0 18XBRD TIE 12 (SUTURE) ×1 IMPLANT
SUT SILK 4 0 (SUTURE) ×2
SUT SILK 4-0 18XBRD TIE 12 (SUTURE) ×1 IMPLANT
SUT VIC AB 3-0 SH 27 (SUTURE) ×6
SUT VIC AB 3-0 SH 27X BRD (SUTURE) ×3 IMPLANT
SUT VICRYL+ 3-0 27IN RB-1 (SUTURE) ×2 IMPLANT
SUTURE MNCRL 4-0 27XMF (SUTURE) ×1 IMPLANT
SYR 10ML LL (SYRINGE) IMPLANT
SYR 20ML LL LF (SYRINGE) ×2 IMPLANT
SYR 5ML LL (SYRINGE) ×2 IMPLANT

## 2020-11-24 NOTE — Anesthesia Preprocedure Evaluation (Signed)
Anesthesia Evaluation  Patient identified by MRN, date of birth, ID band Patient awake    Reviewed: Allergy & Precautions, H&P , NPO status , Patient's Chart, lab work & pertinent test results  History of Anesthesia Complications Negative for: history of anesthetic complications  Airway Mallampati: III  TM Distance: >3 FB Neck ROM: limited    Dental  (+) Poor Dentition, Chipped, Missing, Dental Advidsory Given, Partial Upper   Pulmonary neg shortness of breath, pneumonia, resolved, neg COPD, Recent URI ,    Pulmonary exam normal        Cardiovascular Exercise Tolerance: Poor hypertension, Pt. on medications (-) angina+ CAD and + Peripheral Vascular Disease  (-) DOE Normal cardiovascular exam(-) dysrhythmias (-) Valvular Problems/Murmurs     Neuro/Psych  Headaches,  Neuromuscular disease negative psych ROS   GI/Hepatic Neg liver ROS, GERD  Controlled and Medicated,  Endo/Other  diabetes, Type 2  Renal/GU Dialysis and ESRFRenal disease  negative genitourinary   Musculoskeletal  (+) Arthritis ,   Abdominal   Peds  Hematology  (+) Blood dyscrasia, anemia ,   Anesthesia Other Findings Anemia    Carpal tunnel syndrome of left wrist  Cataract, bilateral    Chronic cough    Chronic kidney disease  Closed displaced fracture of left femoral neck (HCC) 03/09/2017   Coronary artery disease   Diabetes mellitus without complication ([place])    Dialysis patient ([place])  T-TH-SAT  ESRD (end stage renal disease) ([place]) GERD (gastroesophageal reflux disease) Hypertension    Neuropathy  hands  Pneumonia    Sepsis ([place]) 06/28/2017   Wears dentures  partial upper and lower   1. Left ventricular ejection fraction, by estimation, is 55 to 60%.   Reproductive/Obstetrics negative OB ROS                            Anesthesia Physical  Anesthesia Plan  ASA: 4  Anesthesia Plan: General   Post-op Pain  Management:    Induction: Intravenous  PONV Risk Score and Plan: 3 and Ondansetron, Dexamethasone and Treatment may vary due to age or medical condition  Airway Management Planned: Oral ETT  Additional Equipment:   Intra-op Plan:   Post-operative Plan: Extubation in OR  Informed Consent: I have reviewed the patients History and Physical, chart, labs and discussed the procedure including the risks, benefits and alternatives for the proposed anesthesia with the patient or authorized representative who has indicated his/her understanding and acceptance.     Dental Advisory Given  Plan Discussed with: Anesthesiologist, CRNA and Surgeon  Anesthesia Plan Comments:         Anesthesia Quick Evaluation

## 2020-11-24 NOTE — Anesthesia Postprocedure Evaluation (Signed)
Anesthesia Post Note  Patient: Kelli Nguyen  Procedure(s) Performed: INSERTION OF HERO VASCULAR ACCESS DEVICE (GRAFT) (Right)  Patient location during evaluation: PACU Anesthesia Type: General Level of consciousness: awake and alert, awake and oriented Pain management: pain level controlled Vital Signs Assessment: post-procedure vital signs reviewed and stable Respiratory status: spontaneous breathing, nonlabored ventilation and respiratory function stable Cardiovascular status: blood pressure returned to baseline and stable Postop Assessment: no apparent nausea or vomiting Anesthetic complications: no   No notable events documented.   Last Vitals:  Vitals:   11/24/20 1342 11/24/20 1357  BP: (!) 96/47 (!) 98/50  Pulse: 75 76  Resp: 18 18  Temp:  (!) 36.3 C  SpO2: 100% 96%    Last Pain:  Vitals:   11/24/20 1357  TempSrc: Temporal  PainSc: 0-No pain                 Phill Mutter

## 2020-11-24 NOTE — Discharge Instructions (Signed)

## 2020-11-24 NOTE — Anesthesia Procedure Notes (Signed)
Procedure Name: Intubation Date/Time: 11/24/2020 7:40 AM Performed by: Biagio Borg, CRNA Pre-anesthesia Checklist: Patient identified, Emergency Drugs available, Suction available and Patient being monitored Patient Re-evaluated:Patient Re-evaluated prior to induction Oxygen Delivery Method: Circle system utilized Preoxygenation: Pre-oxygenation with 100% oxygen Induction Type: IV induction Ventilation: Mask ventilation without difficulty Laryngoscope Size: McGraph and 3 Grade View: Grade I Tube type: Oral Number of attempts: 1 Airway Equipment and Method: Stylet Placement Confirmation: ETT inserted through vocal cords under direct vision, positive ETCO2 and breath sounds checked- equal and bilateral Tube secured with: Tape Dental Injury: Teeth and Oropharynx as per pre-operative assessment

## 2020-11-24 NOTE — H&P (Signed)
@LOGO @   MRN : [id number]  Kelli Nguyen is a 75 y.o. (08/05/1945) female who presents with chief complaint of No chief complaint on file. [name]  History of Present Illness:  Patient returns the office for follow-up care status post excision of an infected left arm hero graft on January 19, 2019.  She did require exchange of her left IJ tunneled catheter 2 days later secondary to malfunction of the catheter.  A 27 tip to cuff catheter was placed at that time.  She reports her catheter is been working well.   Most recently she had a venogram of the right in 03/2020: Right upper extremity venogram, right jugular venogram, and superior venacavogram with percutaneous transluminal angioplasty of the right innominate vein with 8 mm diameter by 6 cm length Lutonix drug-coated angioplasty balloon    The patient returns to the office for followup she continues to have significant left lower extremity pain apparently she is undergoing injections for her hip. There have been no interval changes in lower extremity symptoms. No interval shortening of the patient's claudication distance or development of rest pain symptoms. No new ulcers or wounds have occurred since the last visit.   There have been no significant changes to the patient's overall health care.   The patient denies amaurosis fugax or recent TIA symptoms. There are no recent neurological changes noted. The patient denies history of DVT, PE or superficial thrombophlebitis. The patient denies recent episodes of angina or shortness of breath.   Previous ABI Rt=[place] and Lt=[place]  (biphasic signals)  Current Meds  Medication Sig   acetaminophen (TYLENOL) 500 MG tablet Take 500 mg by mouth every 6 (six) hours as needed for moderate pain or headache.   aspirin 81 MG tablet Take 81 mg by mouth daily.   cephALEXin (KEFLEX) 500 MG capsule Take 500 mg by mouth 2 (two) times daily.   cetirizine (ZYRTEC) 10 MG tablet Take 10 mg by mouth daily.   cinacalcet  (SENSIPAR) 30 MG tablet Take 30 mg by mouth Every Tuesday,Thursday,and Saturday with dialysis.   docusate sodium (COLACE) 100 MG capsule Take 100 mg by mouth daily.   latanoprost (XALATAN) 0.005 % ophthalmic solution Place 1 drop into both eyes at bedtime.   pantoprazole (PROTONIX) 40 MG tablet Take 40 mg by mouth daily.   pravastatin (PRAVACHOL) 20 MG tablet Take 20 mg by mouth daily.   sevelamer carbonate (RENVELA) 800 MG tablet Take 1,600 mg by mouth 3 (three) times daily with meals.   Timolol Maleate 0.5 % (DAILY) SOLN Place 1 drop into both eyes every morning.    Past Medical History:  Diagnosis Date   Anemia    Carpal tunnel syndrome of left wrist    Cataract, bilateral    Chronic cough    Closed displaced fracture of left femoral neck ([place]) 03/09/2017   Coronary artery disease    s/p 3v CABG (LIMA-LAD, SVG-OM1, SVG-PDA) in 2008   Diabetes mellitus without complication (HCC)    ESRD (end stage renal disease) on dialysis ([place])    T-TH-SAT   GERD (gastroesophageal reflux disease)    Grade II diastolic dysfunction    HLD (hyperlipidemia)    Hypertension    Murmur    OA (osteoarthritis)    PAD (peripheral artery disease) (HCC)    Pneumonia    Polyneuropathy    feet and hands   Pulmonary hypertension (HCC)    RBBB    Secondary hyperparathyroidism of renal origin ([place])  Sepsis ([place]) 06/28/2017   Wears dentures    partial upper and lower    Past Surgical History:  Procedure Laterality Date   A/V SHUNT INTERVENTION N/A 10/16/2016   Procedure: A/V Shunt Intervention;  Surgeon: Katha Cabal, MD;  Location: [facility];  Service: Cardiovascular;  Laterality: N/A;   A/V SHUNTOGRAM Left 10/16/2016   Procedure: A/V Shuntogram;  Surgeon: Katha Cabal, MD;  Location: [place];  Service: Cardiovascular;  Laterality: Left;   ABDOMINAL HYSTERECTOMY     CARDIAC CATHETERIZATION N/A 02/26/2014   CARPAL TUNNEL RELEASE Left 09/27/2015   Procedure:  CARPAL TUNNEL RELEASE;  Surgeon: Leanor Kail, MD;  Location: [facility];  Service: Orthopedics;  Laterality: Left;   CATARACT EXTRACTION W/PHACO Right 09/23/2016   Procedure: CATARACT EXTRACTION PHACO AND INTRAOCULAR LENS PLACEMENT (I[facility]) complicated diabetic right;  Surgeon: Ronnell Freshwater, MD;  Location: [facility];  Service: Ophthalmology;  Laterality: Right;  Diabetic - diet controlled Potassium draw before surgery   CATARACT EXTRACTION W/PHACO Left 01/20/2017   Procedure: CATARACT EXTRACTION PHACO AND INTRAOCULAR LENS PLACEMENT ([place]) LEFT DIABETIC;  Surgeon: Eulogio Bear, MD;  Location: [facility];  Service: Ophthalmology;  Laterality: Left;  Diabetic - diet controlled Potassium draw prior to procedure  8:00 ARRIVAL   COLONOSCOPY     COLONOSCOPY WITH PROPOFOL N/A 11/28/2014   Procedure: COLONOSCOPY WITH PROPOFOL;  Surgeon: Manya Silvas, MD;  Location: [facility];  Service: Endoscopy;  Laterality: N/A;   CORONARY ARTERY BYPASS GRAFT N/A 2008   3v; LIMA-LAD, SVG-OM1, SVG-PDA; Location: [facility]; Surgeon: Jannett Celestine, MD   DIALYSIS/PERMA CATHETER INSERTION Left 01/21/2019   Procedure: DIALYSIS/PERMA CATHETER EXCHANGE;  Surgeon: Algernon Huxley, MD;  Location: [place] [facility];  Service: Cardiovascular;  Laterality: Left;   HEMIARTHROPLASTY HIP Left 2018   INSERTION OF DIALYSIS CATHETER Left 01/19/2019   Procedure: INSERTION OF DIALYSIS CATHETER;  Surgeon: Katha Cabal, MD;  Location: [facility];  Service: Vascular;  Laterality: Left;   LIGATIONS OF HERO GRAFT Left 01/19/2019   Procedure: EXCISION LEFT HERO GRAFT;  Surgeon: Katha Cabal, MD;  Location: [facility];  Service: Vascular;  Laterality: Left;   NEPHRECTOMY Left    PERIPHERAL VASCULAR CATHETERIZATION Left 01/18/2015   Procedure: A/V Shuntogram/Fistulagram;  Surgeon: Katha Cabal, MD;  Location: [place] [facility];  Service: Cardiovascular;  Laterality: Left;   PERIPHERAL  VASCULAR CATHETERIZATION Left 01/18/2015   Procedure: A/V Shunt Intervention;  Surgeon: Katha Cabal, MD;  Location: [place] [facility];  Service: Cardiovascular;  Laterality: Left;   PERIPHERAL VASCULAR CATHETERIZATION N/A 08/10/2015   Procedure: Thrombectomy;  Surgeon: Algernon Huxley, MD;  Location: [place] [facility];  Service: Cardiovascular;  Laterality: N/A;   PERIPHERAL VASCULAR CATHETERIZATION N/A 08/10/2015   Procedure: A/V Shuntogram/Fistulagram;  Surgeon: Algernon Huxley, MD;  Location: [place] [facility];  Service: Cardiovascular;  Laterality: N/A;   PERIPHERAL VASCULAR CATHETERIZATION N/A 08/10/2015   Procedure: A/V Shunt Intervention;  Surgeon: Algernon Huxley, MD;  Location: [facility] [facility];  Service: Cardiovascular;  Laterality: N/A;   PERIPHERAL VASCULAR THROMBECTOMY Left 01/12/2019   Procedure: PERIPHERAL VASCULAR THROMBECTOMY;  Surgeon: Katha Cabal, MD;  Location: [place] [facility];  Service: Cardiovascular;  Laterality: Left;   REMOVAL OF A DIALYSIS CATHETER Right 01/19/2019   Procedure: REMOVAL OF A DIALYSIS CATHETER;  Surgeon: Katha Cabal, MD;  Location: [facility];  Service: Vascular;  Laterality: Right;   RENAL SHUNTS  UPPER EXTREMITY VENOGRAPHY Right 03/20/2020   Procedure: UPPER EXTREMITY VENOGRAPHY;  Surgeon: Algernon Huxley, MD;  Location: [place] [facility];  Service: Cardiovascular;  Laterality: Right;    Social History Social History   Tobacco Use   Smoking status: Never   Smokeless tobacco: Never  Vaping Use   Vaping Use: Never used  Substance Use Topics   Alcohol use: No   Drug use: No    Family History Family History  Problem Relation Age of Onset   Hypertension Sister     Allergies  Allergen Reactions   Penicillins Hives, Itching and Swelling    Has patient had a PCN reaction causing immediate rash, facial/tongue/throat swelling, SOB or lightheadedness with hypotension: No Has patient had a PCN reaction causing severe  rash involving mucus membranes or skin necrosis: No Has patient had a PCN reaction that required hospitalization: No Has patient had a PCN reaction occurring within the last 10 years: No If all of the above answers are "NO", then may proceed with Cephalosporin use.  Penicillin allergy assessment completed on 03/22/20. Patient has received and tolerated cepholosporins incuding cefepime, ceftriaxone.    Codeine Hives   Codeine Sulfate Nausea Only   Contrast Media [Iodinated Diagnostic Agents] Hives   Gadobutrol Hives   Gadolinium Derivatives Hives   Metrizamide Hives   Statins Other (See Comments)    muscle pain     REVIEW OF SYSTEMS (Negative unless checked)  Constitutional: [] Weight loss  [] Fever  [] Chills Cardiac: [] Chest pain   [] Chest pressure   [] Palpitations   [] Shortness of breath when laying flat   [] Shortness of breath with exertion. Vascular:  [] Pain in legs with walking   [] Pain in legs at rest  [] History of DVT   [] Phlebitis   [] Swelling in legs   [] Varicose veins   [] Non-healing ulcers Pulmonary:   [] Uses home oxygen   [] Productive cough   [] Hemoptysis   [] Wheeze  [] COPD   [] Asthma Neurologic:  [] Dizziness   [] Seizures   [] History of stroke   [] History of TIA  [] Aphasia   [] Vissual changes   [] Weakness or numbness in arm   [] Weakness or numbness in leg Musculoskeletal:   [] Joint swelling   [] Joint pain   [] Low back pain Hematologic:  [] Easy bruising  [] Easy bleeding   [] Hypercoagulable state   [] Anemic Gastrointestinal:  [] Diarrhea   [] Vomiting  [] Gastroesophageal reflux/heartburn   [] Difficulty swallowing. Genitourinary:  [x] Chronic kidney disease   [] Difficult urination  [] Frequent urination   [] Blood in urine Skin:  [] Rashes   [] Ulcers  Psychological:  [] History of anxiety   []  History of major depression.  Physical Examination  Vitals:   11/24/20 0644  BP: (!) 128/55  Pulse: 67  Resp: 16  Temp: 97.9 F (36.6 C)  TempSrc: Oral  SpO2: 100%  Weight: 73 kg   Height: 4\' 11"  (1.499 m)   Body mass index is 32.51 kg/m. Gen: WD/WN, NAD Head: [place]/AT, No temporalis wasting.  Ear/Nose/Throat: Hearing grossly intact, nares w/o erythema or drainage Eyes: PER, EOMI, sclera nonicteric.  Neck: Supple, no large masses.   Pulmonary:  Good air movement, no audible wheezing bilaterally, no use of accessory muscles.  Cardiac: RRR, no JVD Vascular:  Vessel Right Left  Radial Palpable Palpable  Brachial Palpable Palpable  Gastrointestinal: Non-distended. No guarding/no peritoneal signs.  Musculoskeletal: M/S 5/5 throughout.  No deformity or atrophy.  Neurologic: CN 2-12 intact. Symmetrical.  Speech is fluent. Motor exam as listed above. Psychiatric: Judgment intact, Mood & affect  appropriate for pt's clinical situation. Dermatologic: No rashes or ulcers noted.  No changes consistent with cellulitis. Lymph : No lichenification or skin changes of chronic lymphedema.  CBC Lab Results  Component Value Date   WBC 8.2 11/17/2020   HGB 12.6 11/24/2020   HCT 37.0 11/24/2020   MCV 87.2 11/17/2020   PLT 231 11/17/2020    BMET    Component Value Date/Time   NA 139 11/24/2020 0642   NA 135 (L) 03/29/2013 1409   K 3.7 11/24/2020 0642   K 4.5 03/29/2013 1409   CL 99 11/24/2020 0642   CL 97 (L) 03/29/2013 1409   CO2 32 11/17/2020 1328   CO2 27 03/29/2013 1409   GLUCOSE 124 (H) 11/24/2020 0642   GLUCOSE 97 03/29/2013 1409   BUN 29 (H) 11/24/2020 0642   BUN 48 (H) 03/29/2013 1409   CREATININE 5.90 (H) 11/24/2020 0642   CREATININE 12.06 (H) 03/29/2013 1409   CALCIUM 8.5 (L) 11/17/2020 1328   CALCIUM 8.8 03/29/2013 1409   GFRNONAA 7 (L) 11/17/2020 1328   GFRNONAA 3 (L) 03/29/2013 1409   GFRAA 3 (L) 01/21/2019 0504   GFRAA 3 (L) 03/29/2013 1409   Estimated Creatinine Clearance: 7.2 mL/min (A) (by C-G formula based on SCr of 5.9 mg/dL (H)).  COAG Lab Results  Component Value Date   INR 1.1 11/17/2020   INR 1.0 01/19/2019    Radiology No results  found.   Assessment/Plan 1. Complication of vascular access for dialysis, sequela Recommend:   At this time the patient does not have appropriate extremity access for dialysis   Patient should have a right arm HERO created.   The risks, benefits and alternative therapies were reviewed in detail with the patient.  All questions were answered.  The patient agrees to proceed with surgery.     2. ESRD (end stage renal disease) ([place]) At the present time the patient has adequate dialysis access.   Continue hemodialysis as ordered without interruption.  Avoid nephrotoxic medications and dehydration.  Further plans per nephrology   3. Superior vena cava compression syndrome Patient will require a HERO graft   4. Coronary artery disease involving native coronary artery of native heart with angina pectoris ([place]) Continue cardiac and antihypertensive medications as already ordered and reviewed, no changes at this time.   Continue statin as ordered and reviewed, no changes at this time   Nitrates PRN for chest pain   Hortencia Pilar, MD  11/24/2020 7:21 AM

## 2020-11-24 NOTE — Transfer of Care (Signed)
Immediate Anesthesia Transfer of Care Note  Patient: Kelli Nguyen  Procedure(s) Performed: INSERTION OF HERO VASCULAR ACCESS DEVICE (GRAFT) (Right)  Patient Location: PACU  Anesthesia Type:General  Level of Consciousness: alert   Airway & Oxygen Therapy: Patient Spontanous Breathing and Patient connected to face mask oxygen  Post-op Assessment: Report given to RN and Post -op Vital signs reviewed and stable  Post vital signs: Reviewed and stable  Last Vitals:  Vitals Value Taken Time  BP 98/80 11/24/20 1134  Temp    Pulse 76 11/24/20 1137  Resp 22 11/24/20 1137  SpO2 100 % 11/24/20 1137  Vitals shown include unvalidated device data.  Last Pain:  Vitals:   11/24/20 0644  TempSrc: Oral  PainSc: 0-No pain         Complications: No notable events documented.

## 2020-11-24 NOTE — Op Note (Signed)
OPERATIVE NOTE   PROCEDURE: Creation of right arm prosthetic A-V graft, HERO graft Repair of right brachial artery kink causing distal artery occlusion with resection of brachial artery kink and primary end-to-end anastomosis Resection of multiple nonfunctioning AV grafts right arm Placement of venous outflow, intravascular portion of HERO graft Introduction catheter into superior vena cava Percutaneous transluminal angioplasty to 10 mm vein. Ultrasound-guided placement of 8 French venous sheath, right IJ vein  PRE-OPERATIVE DIAGNOSIS: End-stage renal disease requiring hemodialysis, superior vena cava syndrome with central venous stenosis, with multiple past grafts and history of multiple nonfunctioning AV grafts  POST-OPERATIVE DIAGNOSIS: Same; hemodynamically significant kink of the right brachial artery   SURGEON: Hortencia Pilar, MD thank you  ASSISTANT(S): Ms. Hezzie Bump  ANESTHESIA: general  ESTIMATED BLOOD LOSS: 50 cc  FINDING(S): 1.  Central venous stricture noted greater than 80%; kink of the right brachial artery causing severe flow limitation and nonpalpable pulse distally.  Multiple previously placed nonfunctioning grafts right upper arm obstructing placement of the new hero graft.  SPECIMEN(S): Segments of the resected brachial artery to pathology as well as multiple segments of old graft for permanent section  INDICATIONS:   Kelli Nguyen is a 75 y.o. y.o. female who presents with catheter based access. The patient has undergone evaluation in preparation for a upper arm access. The patient appears to be a candidate for a HERO graft. The risks and benefits as well as alternatives have been reviewed in detail with the patient. The patient has had multiple upper extremity access in the past. All questions are answered. Patient voices understanding and wishes to proceed with right arm hero graft area  DESCRIPTION: After obtaining full informed written consent, the  patient was brought back to the operating room and placed supine upon the operating table.  The patient received IV antibiotics prior to induction.  After obtaining adequate anesthesia, the patient was positioned supine with his neck extended slightly and rotated to the right the left neck chest wall and entire left arm is then prepped and draped in the standard fashion appropriate time out is called.    A first assistant is required in order to allow for a safe and more efficient operation.  Duties include retraction of tissues to allow for optimal exposure, assisting with suture ligation of vessels as well as maintaining a clear field of view with suction as needed.  Further duties include assisting with patient positioning during the surgery as well as wound closure.  I believe that this procedure requires a first assistant in order for it to be performed at a level in keeping with the high standards of this institution.   Ultrasound is placed in a sterile sleeve. Ultrasound is utilized secondary to lack of appropriate landmarks and to avoid vascular injury. Under real-time visualization the right IJ vein.  It is noted to be echo lucent and compressible indicating patency.  Image is recorded for the record.  Under real time visualization a microneedle is inserted without difficulty, micro-sheath is then placed over the microwire.  Under fluoroscopic guidance stiff angled Glidewire was advanced into the venous system and with the aid of a Kumpe catheter negotiated into the inferior vena cava.  A Amplatz wire was then advanced through the Kumpe catheter and an 8 French sheath is placed. Hand injection contrast is then utilized to demonstrate the central venous anatomy and a string sign is noted in the right innominate vein extending to the superior vena cava.   A 10 mm  x 8 cm [place] balloon is then selected and advanced over the Amplatz wire across the lesion and inflated to 16 atm for 1 minute. Follow-up  imaging demonstrates a significant improvement. This will allow for passage of the venous outflow component of the HERO graft.  The venous outflow component and the associated sheath and dilators are then opened on the sterile field and prepped in the usual fashion.  The 8 French sheath was removed and the dilators were advanced over the Amplatz wire and subsequently the dilator and peel-away sheath is inserted under direct fluoroscopic guidance. The venous outflow portion of the HERO graft is then advanced over the wire through the peel-away sheath without difficulty and under fluoroscopic guidance positioned with its tip well into the inferior vena cava. Peel-away sheath is removed as is the dilator wire and the outflow component is flushed copiously with heparinized saline and clamped.  Attention is then turned to the brachial artery just above the antecubital crease. A linear incision is made overlying the brachial impulse through the existing scar approximately 1 fingerbreadth above the antecubital crease.  Dissection is carried down through the soft tissues.  Fascia is opened linearly and the neurovascular bundle is identified.  Brachial artery is then identified.  As the dissection proceeds I uncover the brachial artery is kinked at 180 degrees palpation distal to the kink is negative for a impulse suggesting the kink itself is profoundly hemodynamically significant.  Given this unusual circumstance which is completely unrelated and separate from placement of a hero graft repair was both indicated and mandatory.  The brachial artery was dissected free from the surrounding veins and tissues and looped proximally and distally with Silastic Vesseloops.  The dissection extended for 3 to 4 cm both proximally and distally to the kink quite a bit more extensive than would normally be required for creation of an AV graft anastomosis.  The brachial artery was then marked with a surgical marker to maintain  orientation and clamped proximally and distally with profunda femoris vascular clamps.  Approximately 1 cm proximal and distal to the kink the brachial artery was transected.  The kinked segment was then passed off the field as part of the specimen for the case.  The brachial artery was then approximated in an end-to-end fashion.  I then reconstructed the artery using 6-0 Prolene in an interrupted fashion.  Flushing maneuvers were performed and flow was reestablished distally.  A single additional 6-0 Prolene was required for hemostasis.  Excellent palpable impulse is now noted distal to the reconstruction.  Having successfully repaired outflow to the hand I elected to move forward with the procedure.  A second incision is then made in the deltopectoral groove and the dissection is carried down through the soft tissues to expose the fascia covering the pectoralis muscle.   Next, creating a suitable pathway for the new PTFE portion of the hero graft was required.  I then marked with a surgical marker on the skin the proper pathway for the PTFE segment.  However, again as an extenuating and unusual additional problem the patient has at least 3 previous grafts that I could identify.  2 of these were completely blocking any ability to tunnel the new graft in a usable fashion and therefore resection of these old nonfunctioning graft was required to complete the case.  I achieved this by creating 2 linear incisions approximately 1 to 2 cm lateral to the marked path on her arm.  Once the skin was opened  and the subcutaneous tissues were dissected I identified the segments of PTFE graft these were grasped with a Claiborne Billings and then dissected using both Metzenbaum scissors and Bovie cautery.  In both circumstances a 4 to 5 cm segment of graft was removed so that a new graft could be placed in a subcutaneous position that would allow easy access by the dialysis centers.  Hemostasis was controlled in both of these incisions via  Bovie cautery and also interrupted 3-0 Vicryl suture ligatures.  Having resected these grafts there was now an adequate place to tunnel the new graft.  Using the Bard tunneling device a tunnel was then created from the brachial artery exposure to the deltopectoral incision. The PTFE portion of the hero is then pulled subcutaneously and the grommet is positioned in the center of the deltopectoral incision.  The intravascular portion is then pulled subcutaneously from the neck insertion site to the deltopectoral incision using a large Kelly clamp.  Radiology is returned to the OR and under real-time visualization the venous outflow component is pulled back so that the tip is in the mid atrium it is then tunneled from the neck counterincision to the deltopectoral incision and connected to the PTFE portion of the hero graft without difficulty. 0 Ethibond is used to secure this connection.    Graft is approximated to the brachial artery and then trimmed to an appropriate bevel. Brachial artery was then controlled with the vessel loops, arteriotomy is made and extended with Potts scissors. 6-0 Prolene stay suture is placed and an end graft to side brachial artery anastomosis fashioned with CV 6 Gore suture. Flushing maneuvers were performed and flow is reestablished to the hand.    Flow was now established through the graft and an excellent thrill is noted. Radial pulses maintain.  Exparel and Marcaine were then infiltrated into the soft tissues at all of the incisions.  All 3 wounds were then irrigated with sterile saline the brachial as well as the deltopectoral incisions are closed in layers using 3-0 Vicryl for the subcutaneous layers followed by 4-0 Monocryl subcuticular to close skin. Neck counterincision was closed with 4-0 Monocryl subcuticular. Dermabond is applied to all 3 incisions.  Patient tolerated procedure well there were no immediate complications. Taken to the recovery room in good  condition.  COMPLICATIONS: None  CONDITION: Good  Hortencia Pilar. [facility] Office: [phone]  11/24/2020,11:22 AM

## 2020-11-24 NOTE — Interval H&P Note (Signed)
History and Physical Interval Note:  11/24/2020 7:24 AM  Kelli Nguyen  has presented today for surgery, with the diagnosis of ESRD.  The various methods of treatment have been discussed with the patient and family. After consideration of risks, benefits and other options for treatment, the patient has consented to  Procedure(s): INSERTION OF HERO VASCULAR ACCESS DEVICE (GRAFT) (Right) as a surgical intervention.  The patient's history has been reviewed, patient examined, no change in status, stable for surgery.  I have reviewed the patient's chart and labs.  Questions were answered to the patient's satisfaction.     Hortencia Pilar

## 2020-11-25 ENCOUNTER — Encounter: Payer: Self-pay | Admitting: Emergency Medicine

## 2020-11-25 ENCOUNTER — Other Ambulatory Visit: Payer: Self-pay

## 2020-11-25 ENCOUNTER — Emergency Department: Payer: Medicare Other

## 2020-11-25 ENCOUNTER — Emergency Department
Admission: EM | Admit: 2020-11-25 | Discharge: 2020-11-25 | Disposition: A | Discharge status: Home / Self Care | Payer: Medicare Other | Attending: Emergency Medicine | Admitting: Emergency Medicine

## 2020-11-25 DIAGNOSIS — I2510 Atherosclerotic heart disease of native coronary artery without angina pectoris: Secondary | ICD-10-CM | POA: Diagnosis not present

## 2020-11-25 DIAGNOSIS — Z20822 Contact with and (suspected) exposure to covid-19: Secondary | ICD-10-CM | POA: Diagnosis not present

## 2020-11-25 DIAGNOSIS — I132 Hypertensive heart and chronic kidney disease with heart failure and with stage 5 chronic kidney disease, or end stage renal disease: Secondary | ICD-10-CM | POA: Insufficient documentation

## 2020-11-25 DIAGNOSIS — R42 Dizziness and giddiness: Secondary | ICD-10-CM | POA: Insufficient documentation

## 2020-11-25 DIAGNOSIS — Z85520 Personal history of malignant carcinoid tumor of kidney: Secondary | ICD-10-CM | POA: Diagnosis not present

## 2020-11-25 DIAGNOSIS — Z951 Presence of aortocoronary bypass graft: Secondary | ICD-10-CM | POA: Diagnosis not present

## 2020-11-25 DIAGNOSIS — N186 End stage renal disease: Secondary | ICD-10-CM | POA: Insufficient documentation

## 2020-11-25 DIAGNOSIS — I5030 Unspecified diastolic (congestive) heart failure: Secondary | ICD-10-CM | POA: Diagnosis not present

## 2020-11-25 DIAGNOSIS — Z7982 Long term (current) use of aspirin: Secondary | ICD-10-CM | POA: Diagnosis not present

## 2020-11-25 DIAGNOSIS — I959 Hypotension, unspecified: Secondary | ICD-10-CM | POA: Insufficient documentation

## 2020-11-25 DIAGNOSIS — Z79899 Other long term (current) drug therapy: Secondary | ICD-10-CM | POA: Insufficient documentation

## 2020-11-25 DIAGNOSIS — E1122 Type 2 diabetes mellitus with diabetic chronic kidney disease: Secondary | ICD-10-CM | POA: Diagnosis not present

## 2020-11-25 DIAGNOSIS — Z992 Dependence on renal dialysis: Secondary | ICD-10-CM | POA: Diagnosis not present

## 2020-11-25 LAB — LACTIC ACID, PLASMA
Lactic Acid, Venous: 1.9 mmol/L (ref 0.5–1.9)
Lactic Acid, Venous: 2.3 mmol/L (ref 0.5–1.9)
Lactic Acid, Venous: 2.1 mmol/L (ref 0.5–1.9)

## 2020-11-25 LAB — HEPATIC FUNCTION PANEL
ALT: 10 U/L (ref 0–44)
AST: 20 U/L (ref 15–41)
Albumin: 3.8 g/dL (ref 3.5–5.0)
Alkaline Phosphatase: 55 U/L (ref 38–126)
Bilirubin, Direct: <0.1 mg/dL (ref 0.0–0.2)
Total Bilirubin: 0.9 mg/dL (ref 0.3–1.2)
Total Protein: 7.1 g/dL (ref 6.5–8.1)

## 2020-11-25 LAB — BASIC METABOLIC PANEL
Anion gap: 12 (ref 5–15)
BUN: 46 mg/dL — ABNORMAL HIGH (ref 8–23)
CO2: 27 mmol/L (ref 22–32)
Calcium: 8 mg/dL — ABNORMAL LOW (ref 8.9–10.3)
Chloride: 97 mmol/L — ABNORMAL LOW (ref 98–111)
Creatinine, Ser: 7.38 mg/dL — ABNORMAL HIGH (ref 0.44–1.00)
GFR, Estimated: 5 mL/min — ABNORMAL LOW (ref >60)
Glucose, Bld: 126 mg/dL — ABNORMAL HIGH (ref 70–99)
Potassium: 3.7 mmol/L (ref 3.5–5.1)
Sodium: 136 mmol/L (ref 135–145)

## 2020-11-25 LAB — APTT: aPTT: 26 seconds (ref 24–36)

## 2020-11-25 LAB — RESP PANEL BY RT-PCR (FLU A&B, COVID) ARPGX2
Influenza A by PCR: NEGATIVE
Influenza B by PCR: NEGATIVE
SARS Coronavirus 2 by RT PCR: NEGATIVE

## 2020-11-25 LAB — CBC
HCT: 25.7 % — ABNORMAL LOW (ref 36.0–46.0)
Hemoglobin: 8.2 g/dL — ABNORMAL LOW (ref 12.0–15.0)
MCH: 27.6 pg (ref 26.0–34.0)
MCHC: 31.9 g/dL (ref 30.0–36.0)
MCV: 86.5 fL (ref 80.0–100.0)
Platelets: 188 10*3/uL (ref 150–400)
RBC: 2.97 MIL/uL — ABNORMAL LOW (ref 3.87–5.11)
RDW: 15.9 % — ABNORMAL HIGH (ref 11.5–15.5)
WBC: 13 10*3/uL — ABNORMAL HIGH (ref 4.0–10.5)
nRBC: 0 % (ref 0.0–0.2)

## 2020-11-25 LAB — TROPONIN I (HIGH SENSITIVITY)
Troponin I (High Sensitivity): 41 ng/L — ABNORMAL HIGH (ref <18)
Troponin I (High Sensitivity): 37 ng/L — ABNORMAL HIGH (ref <18)

## 2020-11-25 LAB — PROCALCITONIN: Procalcitonin: 0.34 ng/mL

## 2020-11-25 LAB — PROTIME-INR
INR: 1.1 (ref 0.8–1.2)
Prothrombin Time: 13.9 seconds (ref 11.4–15.2)

## 2020-11-25 MED ORDER — LACTATED RINGERS IV BOLUS
500.0000 mL | Freq: Once | INTRAVENOUS | Status: AC
  Administered 2020-11-25: 500 mL via INTRAVENOUS

## 2020-11-25 NOTE — ED Provider Notes (Signed)
[facility] Emergency Department Provider Note  ____________________________________________   None    (approximate)  I have reviewed the triage vital signs and the nursing notes.   HISTORY  Chief Complaint Hypotension and Dizziness   HPI Kelli Nguyen is a 75 y.o. female with a past medical history of anemia, CAD, HTN, HDL, PAD, OA, pulmonary pretension, right bundle branch block, and ESRD status post recent right AV graft creation yesterday repair of right brachial artery kink causing distal occlusion presents from dialysis for evaluation of treatment to be hypotensive.  Patient states she felt a little dizzy this morning otherwise has been feeling fine and has not had any chest pain, extremity pain, vomiting, diarrhea, blood in her stool or recent injuries or falls.  She denies any rashes, extremity pain, focal weakness or any other acute concerns.  She is now on a blood thinner.  She states that she went on dialysis sessions last week.         Past Medical History:  Diagnosis Date   Anemia    Carpal tunnel syndrome of left wrist    Cataract, bilateral    Chronic cough    Closed displaced fracture of left femoral neck ([place]) 03/09/2017   Coronary artery disease    s/p 3v CABG (LIMA-LAD, SVG-OM1, SVG-PDA) in 2008   Diabetes mellitus without complication ([place])    ESRD (end stage renal disease) on dialysis ([place])    T-TH-SAT   GERD (gastroesophageal reflux disease)    Grade II diastolic dysfunction    HLD (hyperlipidemia)    Hypertension    Murmur    OA (osteoarthritis)    PAD (peripheral artery disease) (HCC)    Pneumonia    Polyneuropathy    feet and hands   Pulmonary hypertension ([place])    RBBB    Secondary hyperparathyroidism of renal origin ([place])    Sepsis ([place]) 06/28/2017   Wears dentures    partial upper and lower    Patient Active Problem List   Diagnosis Date Noted   Pulmonary hypertension, unspecified ([place]) 08/21/2020    Valvular heart disease 08/21/2020   Risk for falls 12/30/2019   Atherosclerosis of native arteries of extremity with intermittent claudication ([place]) 09/27/2019   Leg pain 09/06/2019   Pain due to onychomycosis of toenails of both feet 05/24/2019   Type 2 diabetes mellitus with vascular disease ([place]) 05/24/2019   Bacterial infection, unspecified 02/04/2019   ESRD (end stage renal disease) ([place]) 01/21/2019   Septic shock ([place])    Infected prosthetic vascular graft ([place]) 01/19/2019   GERD (gastroesophageal reflux disease) 01/18/2019   Arthritis 01/18/2019   Chronic pain syndrome 01/18/2019   Constipation 01/18/2019   Glaucoma 01/18/2019   Muscle weakness 01/18/2019   Pain of left hip joint 01/18/2019   Pressure ulcer of sacral region 01/18/2019   Diabetes mellitus with kidney complication ([place]) 73/71/0626   Obesity (BMI 30-39.9) 11/12/2017   Red blood cell antibody positive 11/12/2017   Sepsis, unspecified organism ([place]) 06/28/2017   Closed displaced fracture of left femoral neck ([place]) 03/09/2017   End stage renal failure on dialysis ([place]) 94/85/4627   Complication of vascular access for dialysis 03/17/2016   Superior vena cava compression syndrome 03/17/2016   Hyperlipidemia 03/17/2016   Moderate protein-calorie malnutrition ([place]) 03/11/2016   Bilateral hand numbness 02/22/2015   Bilateral hand pain 02/22/2015   Hand weakness 02/22/2015   History of adenomatous polyp of colon 10/12/2014   Pruritus, unspecified 07/25/2014   Left  ventricular dysfunction 05/25/2014   Iron deficiency anemia, unspecified 04/25/2014   Iron deficiency anemia 04/25/2014   Carpal tunnel syndrome on right 04/07/2014   Allergy, unspecified, sequela 01/21/2014   Pain, unspecified 01/21/2014   Diarrhea, unspecified 01/21/2014   Cancer of kidney ([place]) 10/06/2013   Coagulation defect, unspecified ([place]) 07/21/2013   Fever, unspecified 07/21/2013   Headache, unspecified 07/21/2013   Shortness of breath  07/21/2013   Encounter for immunization 02/15/2013   Hypotension 11/10/2012   CAD (coronary artery disease) 11/10/2012   Coronary artery disease involving native coronary artery of native heart without angina pectoris 11/10/2012   Anemia in chronic kidney disease 06/26/2009   Dependence on renal dialysis ([place]) 06/26/2009   Secondary hyperparathyroidism of renal origin ([place]) 02/28/2003   Hypertensive chronic kidney disease with stage 5 chronic kidney disease or end stage renal disease ([place]) 04/01/2001   Type 2 diabetes mellitus without complications ([place]) 99/83/3825   End stage renal disease ([place]) 04/01/2001    Past Surgical History:  Procedure Laterality Date   A/V SHUNT INTERVENTION N/A 10/16/2016   Procedure: A/V Shunt Intervention;  Surgeon: Katha Cabal, MD;  Location: [place] [facility];  Service: Cardiovascular;  Laterality: N/A;   A/V SHUNTOGRAM Left 10/16/2016   Procedure: A/V Shuntogram;  Surgeon: Katha Cabal, MD;  Location: [place] [facility];  Service: Cardiovascular;  Laterality: Left;   ABDOMINAL HYSTERECTOMY     CARDIAC CATHETERIZATION N/A 02/26/2014   CARPAL TUNNEL RELEASE Left 09/27/2015   Procedure: CARPAL TUNNEL RELEASE;  Surgeon: Leanor Kail, MD;  Location: [facility];  Service: Orthopedics;  Laterality: Left;   CATARACT EXTRACTION W/PHACO Right 09/23/2016   Procedure: CATARACT EXTRACTION PHACO AND INTRAOCULAR LENS PLACEMENT (IOC) complicated diabetic right;  Surgeon: Ronnell Freshwater, MD;  Location: [place];  Service: Ophthalmology;  Laterality: Right;  Diabetic - diet controlled Potassium draw before surgery   CATARACT EXTRACTION W/PHACO Left 01/20/2017   Procedure: CATARACT EXTRACTION PHACO AND INTRAOCULAR LENS PLACEMENT ([place]) LEFT DIABETIC;  Surgeon: Eulogio Bear, MD;  Location: [place];  Service: Ophthalmology;  Laterality: Left;  Diabetic - diet controlled Potassium draw prior to procedure  8:00 ARRIVAL    COLONOSCOPY     COLONOSCOPY WITH PROPOFOL N/A 11/28/2014   Procedure: COLONOSCOPY WITH PROPOFOL;  Surgeon: Manya Silvas, MD;  [facility];  Service: Endoscopy;  Laterality: N/A;   CORONARY ARTERY BYPASS GRAFT N/A 2008   3v; LIMA-LAD, SVG-OM1, SVG-PDA; Location: [facility]; Surgeon: Jannett Celestine, MD   DIALYSIS/PERMA CATHETER INSERTION Left 01/21/2019   Procedure: DIALYSIS/PERMA CATHETER EXCHANGE;  Surgeon: Algernon Huxley, MD;  Location: [place] [facility];  Service: Cardiovascular;  Laterality: Left;   HEMIARTHROPLASTY HIP Left 2018   INSERTION OF DIALYSIS CATHETER Left 01/19/2019   Procedure: INSERTION OF DIALYSIS CATHETER;  Surgeon: Katha Cabal, MD;  Location: [facility];  Service: Vascular;  Laterality: Left;   LIGATIONS OF HERO GRAFT Left 01/19/2019   Procedure: EXCISION LEFT HERO GRAFT;  Surgeon: Katha Cabal, MD;  Location: [facility];  Service: Vascular;  Laterality: Left;   NEPHRECTOMY Left    PERIPHERAL VASCULAR CATHETERIZATION Left 01/18/2015   Procedure: A/V Shuntogram/Fistulagram;  Surgeon: Katha Cabal, MD;  Location: [place] [facility];  Service: Cardiovascular;  Laterality: Left;   PERIPHERAL VASCULAR CATHETERIZATION Left 01/18/2015   Procedure: A/V Shunt Intervention;  Surgeon: Katha Cabal, MD;  Location: [place] [facility];  Service: Cardiovascular;  Laterality: Left;   PERIPHERAL VASCULAR CATHETERIZATION N/A 08/10/2015  Procedure: Thrombectomy;  Surgeon: Algernon Huxley, MD;  Location: [place] [facility];  Service: Cardiovascular;  Laterality: N/A;   PERIPHERAL VASCULAR CATHETERIZATION N/A 08/10/2015   Procedure: A/V Shuntogram/Fistulagram;  Surgeon: Algernon Huxley, MD;  Location: [place] [facility];  Service: Cardiovascular;  Laterality: N/A;   PERIPHERAL VASCULAR CATHETERIZATION N/A 08/10/2015   Procedure: A/V Shunt Intervention;  Surgeon: Algernon Huxley, MD;  Location: [place] [facility];  Service: Cardiovascular;  Laterality: N/A;    PERIPHERAL VASCULAR THROMBECTOMY Left 01/12/2019   Procedure: PERIPHERAL VASCULAR THROMBECTOMY;  Surgeon: Katha Cabal, MD;  Location: [place] [facility];  Service: Cardiovascular;  Laterality: Left;   REMOVAL OF A DIALYSIS CATHETER Right 01/19/2019   Procedure: REMOVAL OF A DIALYSIS CATHETER;  Surgeon: Katha Cabal, MD;  Location: [facility];  Service: Vascular;  Laterality: Right;   RENAL SHUNTS     UPPER EXTREMITY VENOGRAPHY Right 03/20/2020   Procedure: UPPER EXTREMITY VENOGRAPHY;  Surgeon: Algernon Huxley, MD;  Location: [place] [facility];  Service: Cardiovascular;  Laterality: Right;   VASCULAR ACCESS DEVICE INSERTION Right 11/24/2020   Procedure: INSERTION OF HERO VASCULAR ACCESS DEVICE (GRAFT);  Surgeon: Katha Cabal, MD;  Location: [facility];  Service: Vascular;  Laterality: Right;    Prior to Admission medications   Medication Sig Start Date End Date Taking? Authorizing Provider  acetaminophen (TYLENOL) 500 MG tablet Take 500 mg by mouth every 6 (six) hours as needed for moderate pain or headache.    [provider]  aspirin 81 MG tablet Take 81 mg by mouth daily.    [provider]  cephALEXin (KEFLEX) 500 MG capsule Take 500 mg by mouth 2 (two) times daily. 10/30/20   [provider]  cetirizine (ZYRTEC) 10 MG tablet Take 10 mg by mouth daily.    [provider]  cinacalcet (SENSIPAR) 30 MG tablet Take 30 mg by mouth Every Tuesday,Thursday,and Saturday with dialysis.    [provider]  docusate sodium (COLACE) 100 MG capsule Take 100 mg by mouth daily.    [provider]  HYDROcodone-acetaminophen (NORCO) 5-325 MG tablet Take 1-2 tablets by mouth every 6 (six) hours as needed for moderate pain or severe pain. 11/24/20   Schnier, Dolores Lory, MD  latanoprost (XALATAN) 0.005 % ophthalmic solution Place 1 drop into both eyes at bedtime. 07/28/15   [provider]  pantoprazole (PROTONIX) 40 MG tablet Take 40 mg  by mouth daily. 05/22/20   [provider]  pravastatin (PRAVACHOL) 20 MG tablet Take 20 mg by mouth daily.    [provider]  sevelamer carbonate (RENVELA) 800 MG tablet Take 1,600 mg by mouth 3 (three) times daily with meals. 07/13/13   [provider]  Timolol Maleate 0.5 % (DAILY) SOLN Place 1 drop into both eyes every morning. 04/12/19   [provider]    Allergies Penicillins, Codeine, Codeine sulfate, Contrast media [iodinated diagnostic agents], Gadobutrol, Gadolinium derivatives, Metrizamide, and Statins  Family History  Problem Relation Age of Onset   Hypertension Sister     Social History Social History   Tobacco Use   Smoking status: Never   Smokeless tobacco: Never  Vaping Use   Vaping Use: Never used  Substance Use Topics   Alcohol use: No   Drug use: No    Review of Systems  Review of Systems  Constitutional:  Negative for chills and fever.  HENT:  Negative for sore throat.   Eyes:  Negative  for pain.  Respiratory:  Negative for cough and stridor.   Cardiovascular:  Negative for chest pain.  Gastrointestinal:  Negative for vomiting.  Genitourinary:  Negative for dysuria.  Musculoskeletal:  Negative for myalgias.  Skin:  Negative for rash.  Neurological:  Positive for dizziness. Negative for seizures, loss of consciousness and headaches.  Psychiatric/Behavioral:  Negative for suicidal ideas.   All other systems reviewed and are negative.    ____________________________________________   PHYSICAL EXAM:  VITAL SIGNS: ED Triage Vitals  Enc Vitals Group     BP 11/25/20 0835 (!) 84/38     Pulse Rate 11/25/20 0835 78     Resp 11/25/20 0835 18     Temp 11/25/20 0835 98 F (36.7 C)     Temp Source 11/25/20 0835 Oral     SpO2 11/25/20 0835 97 %     Weight 11/25/20 0835 162 lb (73.5 kg)     Height 11/25/20 0835 4\' 11"  (1.499 m)     Head Circumference --      Peak Flow --      Pain Score 11/25/20 0839 0     Pain Loc  --      Pain Edu? --      Excl. in [place]? --    Vitals:   11/25/20 1430 11/25/20 1500  BP: (!) 101/53 115/60  Pulse:    Resp:    Temp:    SpO2:     Physical Exam Vitals and nursing note reviewed.  Constitutional:      General: She is not in acute distress.    Appearance: She is well-developed.  HENT:     Head: Normocephalic and atraumatic.     Right Ear: External ear normal.     Left Ear: External ear normal.     Nose: Nose normal.     Mouth/Throat:     Mouth: Mucous membranes are dry.  Eyes:     Conjunctiva/sclera: Conjunctivae normal.  Cardiovascular:     Rate and Rhythm: Normal rate and regular rhythm.     Heart sounds: No murmur heard. Pulmonary:     Effort: Pulmonary effort is normal. No respiratory distress.     Breath sounds: Normal breath sounds.  Abdominal:     Palpations: Abdomen is soft.     Tenderness: There is no abdominal tenderness.  Musculoskeletal:     Cervical back: Neck supple.  Skin:    General: Skin is warm and dry.     Capillary Refill: Capillary refill takes 2 to 3 seconds.  Neurological:     Mental Status: She is alert and oriented to person, place, and time.  Psychiatric:        Mood and Affect: Mood normal.    Lungs are clear bilaterally.  Right upper extremity surgical site appears clean dry and intact without bleeding induration significant tenderness warmth edema or other drainage.  Dressing over graft appears clean and intact.  Patient is alert and oriented x4. ____________________________________________   LABS (all labs ordered are listed, but only abnormal results are displayed)  Labs Reviewed  BASIC METABOLIC PANEL - Abnormal; Notable for the following components:      Result Value   Chloride 97 (*)    Glucose, Bld 126 (*)    BUN 46 (*)    Creatinine, Ser 7.38 (*)    Calcium 8.0 (*)    GFR, Estimated 5 (*)    All other components within normal limits  CBC - Abnormal; Notable for the  following components:   WBC 13.0 (*)     RBC 2.97 (*)    Hemoglobin 8.2 (*)    HCT 25.7 (*)    RDW 15.9 (*)    All other components within normal limits  LACTIC ACID, PLASMA - Abnormal; Notable for the following components:   Lactic Acid, Venous 2.3 (*)    All other components within normal limits  LACTIC ACID, PLASMA - Abnormal; Notable for the following components:   Lactic Acid, Venous 2.1 (*)    All other components within normal limits  TROPONIN I (HIGH SENSITIVITY) - Abnormal; Notable for the following components:   Troponin I (High Sensitivity) 41 (*)    All other components within normal limits  TROPONIN I (HIGH SENSITIVITY) - Abnormal; Notable for the following components:   Troponin I (High Sensitivity) 37 (*)    All other components within normal limits  RESP PANEL BY RT-PCR (FLU A&B, COVID) ARPGX2  CULTURE, BLOOD (SINGLE)  LACTIC ACID, PLASMA  HEPATIC FUNCTION PANEL  PROCALCITONIN  PROTIME-INR  APTT  URINALYSIS, COMPLETE (UACMP) WITH MICROSCOPIC  LACTIC ACID, PLASMA  CBG MONITORING, ED  TYPE AND SCREEN   ____________________________________________  EKG  Normal sinus rhythm with a ventricular to 78, right bundle branch block with some nonspecific changes anterior leads without other clear evidence of acute ischemia. ____________________________________________  RADIOLOGY  ED MD interpretation: Central lines in appropriate position on chest x-ray without evidence of edema, focal consolidation, pneumothorax, large effusion or other clear acute thoracic process  Official radiology report(s): DG Chest Portable 1 View  Result Date: 11/25/2020 CLINICAL DATA:  Hypotension.  Dialysis patient. EXAM: PORTABLE CHEST 1 VIEW COMPARISON:  January 20, 2019 FINDINGS: Right left central lines terminate near the caval atrial junction/just within the right atrium. The heart, hila, and mediastinum are normal. No pneumothorax. No nodules or masses. Mild atelectasis in the bases. No overt edema. No other acute  abnormalities. IMPRESSION: No cause for the patient's symptoms identified. Central lines are in good position. Mild bibasilar atelectasis. Electronically Signed   By: Dorise Bullion III M.D   On: 11/25/2020 10:29    ____________________________________________   PROCEDURES  Procedure(s) performed (including Critical Care):  Procedures   ____________________________________________   INITIAL IMPRESSION / ASSESSMENT AND PLAN / ED COURSE      Patient presents with above to history exam consistent with dizziness associate with low blood pressure seen at dialysis earlier today.  On arrival she was hypotensive with BP of 84/38 with otherwise stable vital signs on room air.  Differential includes low blood pressure from dehydration or decreased p.o. intake, pericardial effusion causing tamponade, anemia from recent surgery, distributive shock from acute infectious process, ACS and arrhythmia.  Other than feeling a little bit dizzy patient denies any recent associated sick symptoms with no obvious foci of infection on exam.  Chest x-ray shows no evidence of pneumonia.  Patient does not make any urine.  BMP remarkable for BUN and creatinine consistent with patient's history of ESRD and ongoing yesterday without any other significant electrode or metabolic derangements.  CBC shows mild leukocytosis with WBC count of 13 and hemoglobin 8.2.  Discussed these findings with on-call vascular surgeon Dr. Bridgett Larsson stated that leukocytosis is likely reactive in the setting of recent surgery and that given hemoglobin is close to baseline it was 12.6 yesterday did not believe patient had significant blood loss from her surgery required further infectious work-up from a surgical perspective.  Hepatic function panel unremarkable.  Procalcitonin slightly elevated  0.34.  Troponins stable at 41 and 37.  Given otherwise relatively reassuring ECG with patient denying any chest pain suspicion for ACS.  No significant  arrhythmia identified.  Low suspicion for PE at this time as patient denies any chest pain, cough, shortness of breath and has no tachycardia, tachypnea or hypoxia.  COVID influenza PCR is negative.  Lactic acid 1.9 and 2.3 down to 2.1 after IVF.  On reassessment patient states she longer feels any dizziness and her BP is improved to 111/58.  Seems she is chronically with systolics in the 09W and was discharged with systolics in the 11B yesterday after procedure.  Given she is asymptomatic below is reassuring work-up without clear source other than possibly being a little bit dehydrated I think she is stable for discharge with close outpatient PCP and vascular follow-up.      ____________________________________________   FINAL CLINICAL IMPRESSION(S) / ED DIAGNOSES  Final diagnoses:  Hypotension, unspecified hypotension type    Medications  lactated ringers bolus 500 mL (0 mLs Intravenous Stopped 11/25/20 1302)     ED Discharge Orders     None        Note:  This document was prepared using Dragon voice recognition software and may include unintentional dictation errors.    Lucrezia Starch, MD 11/25/20 1520

## 2020-11-25 NOTE — ED Provider Notes (Signed)
Emergency Medicine Provider Triage Evaluation Note  ITATI BROCKSMITH , a 75 y.o. female  was evaluated in triage.  Pt complains of dizziness, low BP.  Review of Systems  Positive: dizziness Negative: Fever, cough, CP, N/V/D, injury, bleeding   Physical Exam  BP (!) 84/38 (BP Location: Left Wrist)   Pulse 78   Temp 98 F (36.7 C) (Oral)   Resp 18   Ht 4\' 11"  (1.499 m)   Wt 73.5 kg   SpO2 97%   BMI 32.72 kg/m  Gen:   Awake, no distress   Resp:  Normal effort MSK:   Moves extremities without difficulty  Other:    Medical Decision Making  Medically screening exam initiated at 11:12 AM.  Appropriate orders placed.  ONEIDA MCKAMEY was informed that the remainder of the evaluation will be completed by another provider, this initial triage assessment does not replace that evaluation, and the importance of remaining in the ED until their evaluation is complete.     Lucrezia Starch, MD 11/25/20 [phone]

## 2020-11-25 NOTE — ED Triage Notes (Signed)
Pt reports that she was at dialysis this am and they were unable to it because her B/P was in 70's over 50's. She reports that she is feeling a little lightheaded

## 2020-11-27 LAB — SURGICAL PATHOLOGY

## 2020-11-28 ENCOUNTER — Encounter: Payer: Self-pay | Admitting: Vascular Surgery

## 2020-11-28 LAB — TYPE AND SCREEN
ABO/RH(D): A POS
Antibody Screen: POSITIVE
Unit division: 0
Unit division: 0

## 2020-11-28 LAB — BPAM RBC
Blood Product Expiration Date: 202208192359
Blood Product Expiration Date: 202208252359
Unit Type and Rh: 6200
Unit Type and Rh: 6200

## 2020-11-30 ENCOUNTER — Encounter: Payer: Self-pay | Admitting: Emergency Medicine

## 2020-11-30 ENCOUNTER — Observation Stay
Admission: EM | Admit: 2020-11-30 | Discharge: 2020-12-02 | Disposition: A | Discharge status: Home / Self Care | Payer: Medicare Other | Attending: Internal Medicine | Admitting: Internal Medicine

## 2020-11-30 ENCOUNTER — Other Ambulatory Visit: Payer: Self-pay

## 2020-11-30 DIAGNOSIS — T8452XA Infection and inflammatory reaction due to internal left hip prosthesis, initial encounter: Secondary | ICD-10-CM | POA: Diagnosis present

## 2020-11-30 DIAGNOSIS — R778 Other specified abnormalities of plasma proteins: Secondary | ICD-10-CM | POA: Diagnosis present

## 2020-11-30 DIAGNOSIS — I2510 Atherosclerotic heart disease of native coronary artery without angina pectoris: Secondary | ICD-10-CM | POA: Diagnosis present

## 2020-11-30 DIAGNOSIS — N186 End stage renal disease: Principal | ICD-10-CM

## 2020-11-30 DIAGNOSIS — Z79899 Other long term (current) drug therapy: Secondary | ICD-10-CM | POA: Insufficient documentation

## 2020-11-30 DIAGNOSIS — Z7982 Long term (current) use of aspirin: Secondary | ICD-10-CM | POA: Diagnosis not present

## 2020-11-30 DIAGNOSIS — D631 Anemia in chronic kidney disease: Secondary | ICD-10-CM | POA: Diagnosis present

## 2020-11-30 DIAGNOSIS — R7989 Other specified abnormal findings of blood chemistry: Secondary | ICD-10-CM | POA: Diagnosis present

## 2020-11-30 DIAGNOSIS — Z992 Dependence on renal dialysis: Secondary | ICD-10-CM | POA: Insufficient documentation

## 2020-11-30 DIAGNOSIS — I132 Hypertensive heart and chronic kidney disease with heart failure and with stage 5 chronic kidney disease, or end stage renal disease: Secondary | ICD-10-CM | POA: Insufficient documentation

## 2020-11-30 DIAGNOSIS — Z20822 Contact with and (suspected) exposure to covid-19: Secondary | ICD-10-CM | POA: Diagnosis not present

## 2020-11-30 DIAGNOSIS — R55 Syncope and collapse: Secondary | ICD-10-CM

## 2020-11-30 DIAGNOSIS — D649 Anemia, unspecified: Secondary | ICD-10-CM | POA: Diagnosis present

## 2020-11-30 DIAGNOSIS — E785 Hyperlipidemia, unspecified: Secondary | ICD-10-CM | POA: Diagnosis present

## 2020-11-30 DIAGNOSIS — R2681 Unsteadiness on feet: Secondary | ICD-10-CM | POA: Insufficient documentation

## 2020-11-30 DIAGNOSIS — I5032 Chronic diastolic (congestive) heart failure: Secondary | ICD-10-CM | POA: Diagnosis present

## 2020-11-30 DIAGNOSIS — I10 Essential (primary) hypertension: Secondary | ICD-10-CM | POA: Diagnosis present

## 2020-11-30 DIAGNOSIS — I5020 Unspecified systolic (congestive) heart failure: Secondary | ICD-10-CM | POA: Diagnosis not present

## 2020-11-30 DIAGNOSIS — E1169 Type 2 diabetes mellitus with other specified complication: Secondary | ICD-10-CM | POA: Diagnosis present

## 2020-11-30 DIAGNOSIS — R531 Weakness: Secondary | ICD-10-CM | POA: Diagnosis present

## 2020-11-30 DIAGNOSIS — E876 Hypokalemia: Secondary | ICD-10-CM | POA: Diagnosis present

## 2020-11-30 DIAGNOSIS — E1122 Type 2 diabetes mellitus with diabetic chronic kidney disease: Secondary | ICD-10-CM | POA: Diagnosis not present

## 2020-11-30 DIAGNOSIS — E1129 Type 2 diabetes mellitus with other diabetic kidney complication: Secondary | ICD-10-CM | POA: Diagnosis present

## 2020-11-30 LAB — BASIC METABOLIC PANEL
Anion gap: 16 — ABNORMAL HIGH (ref 5–15)
BUN: 26 mg/dL — ABNORMAL HIGH (ref 8–23)
CO2: 27 mmol/L (ref 22–32)
Calcium: 8 mg/dL — ABNORMAL LOW (ref 8.9–10.3)
Chloride: 92 mmol/L — ABNORMAL LOW (ref 98–111)
Creatinine, Ser: 5.65 mg/dL — ABNORMAL HIGH (ref 0.44–1.00)
GFR, Estimated: 7 mL/min — ABNORMAL LOW (ref >60)
Glucose, Bld: 131 mg/dL — ABNORMAL HIGH (ref 70–99)
Potassium: 3.4 mmol/L — ABNORMAL LOW (ref 3.5–5.1)
Sodium: 135 mmol/L (ref 135–145)

## 2020-11-30 LAB — BPAM RBC
Blood Product Expiration Date: 202208192359
Blood Product Expiration Date: 202208252359
Unit Type and Rh: 6200
Unit Type and Rh: 6200

## 2020-11-30 LAB — TROPONIN I (HIGH SENSITIVITY)
Troponin I (High Sensitivity): 17 ng/L (ref <18)
Troponin I (High Sensitivity): 18 ng/L — ABNORMAL HIGH (ref <18)

## 2020-11-30 LAB — PREPARE RBC (CROSSMATCH)

## 2020-11-30 LAB — TYPE AND SCREEN
ABO/RH(D): A POS
Antibody Screen: POSITIVE
Unit division: 0
Unit division: 0

## 2020-11-30 LAB — APTT: aPTT: 34 seconds (ref 24–36)

## 2020-11-30 LAB — RETICULOCYTES
Immature Retic Fract: 31.9 % — ABNORMAL HIGH (ref 2.3–15.9)
RBC.: 2.8 MIL/uL — ABNORMAL LOW (ref 3.87–5.11)
Retic Count, Absolute: 82.3 10*3/uL (ref 19.0–186.0)
Retic Ct Pct: 2.9 % (ref 0.4–3.1)

## 2020-11-30 LAB — FOLATE: Folate: 12.4 ng/mL (ref >5.9)

## 2020-11-30 LAB — CULTURE, BLOOD (SINGLE): Culture: NO GROWTH

## 2020-11-30 LAB — CBC
HCT: 24.2 % — ABNORMAL LOW (ref 36.0–46.0)
Hemoglobin: 7.7 g/dL — ABNORMAL LOW (ref 12.0–15.0)
MCH: 27.5 pg (ref 26.0–34.0)
MCHC: 31.8 g/dL (ref 30.0–36.0)
MCV: 86.4 fL (ref 80.0–100.0)
Platelets: 241 10*3/uL (ref 150–400)
RBC: 2.8 MIL/uL — ABNORMAL LOW (ref 3.87–5.11)
RDW: 16.3 % — ABNORMAL HIGH (ref 11.5–15.5)
WBC: 10.1 10*3/uL (ref 4.0–10.5)
nRBC: 0 % (ref 0.0–0.2)

## 2020-11-30 LAB — IRON AND TIBC
Iron: 49 ug/dL (ref 28–170)
Saturation Ratios: 22 % (ref 10.4–31.8)
TIBC: 224 ug/dL — ABNORMAL LOW (ref 250–450)
UIBC: 175 ug/dL

## 2020-11-30 LAB — RESP PANEL BY RT-PCR (FLU A&B, COVID) ARPGX2
Influenza A by PCR: NEGATIVE
Influenza B by PCR: NEGATIVE
SARS Coronavirus 2 by RT PCR: NEGATIVE

## 2020-11-30 LAB — PROTIME-INR
INR: 1.1 (ref 0.8–1.2)
Prothrombin Time: 14 seconds (ref 11.4–15.2)

## 2020-11-30 LAB — FERRITIN: Ferritin: 1275 ng/mL — ABNORMAL HIGH (ref 11–307)

## 2020-11-30 LAB — GLUCOSE, CAPILLARY: Glucose-Capillary: 105 mg/dL — ABNORMAL HIGH (ref 70–99)

## 2020-11-30 MED ORDER — LATANOPROST 0.005 % OP SOLN
1.0000 [drp] | Freq: Every day | OPHTHALMIC | Status: DC
  Administered 2020-11-30 – 2020-12-01 (×2): 1 [drp] via OPHTHALMIC
  Filled 2020-11-30: qty 2.5

## 2020-11-30 MED ORDER — CINACALCET HCL 30 MG PO TABS
30.0000 mg | ORAL_TABLET | ORAL | Status: DC
  Filled 2020-11-30: qty 1

## 2020-11-30 MED ORDER — HYDROCODONE-ACETAMINOPHEN 5-325 MG PO TABS: 1.0000 | ORAL_TABLET | Freq: Four times a day (QID) | ORAL | Status: DC | PRN

## 2020-11-30 MED ORDER — PANTOPRAZOLE SODIUM 40 MG PO TBEC
40.0000 mg | DELAYED_RELEASE_TABLET | Freq: Every day | ORAL | Status: DC
  Administered 2020-11-30 – 2020-12-02 (×3): 40 mg via ORAL
  Filled 2020-11-30 (×3): qty 1

## 2020-11-30 MED ORDER — SODIUM CHLORIDE 0.9 % IV SOLN: 10.0000 mL/h | Freq: Once | INTRAVENOUS | Status: DC

## 2020-11-30 MED ORDER — HYDROXYZINE HCL 10 MG PO TABS
10.0000 mg | ORAL_TABLET | Freq: Three times a day (TID) | ORAL | Status: DC | PRN
  Filled 2020-11-30: qty 1

## 2020-11-30 MED ORDER — SODIUM CHLORIDE 0.9 % IV SOLN: 100.0000 mL | INTRAVENOUS | Status: DC | PRN

## 2020-11-30 MED ORDER — ASPIRIN EC 81 MG PO TBEC
81.0000 mg | DELAYED_RELEASE_TABLET | Freq: Every day | ORAL | Status: DC
  Administered 2020-11-30 – 2020-12-02 (×3): 81 mg via ORAL
  Filled 2020-11-30 (×3): qty 1

## 2020-11-30 MED ORDER — PENTAFLUOROPROP-TETRAFLUOROETH EX AERO
1.0000 "application " | INHALATION_SPRAY | CUTANEOUS | Status: DC | PRN
  Filled 2020-11-30: qty 30

## 2020-11-30 MED ORDER — LORATADINE 10 MG PO TABS: 10.0000 mg | ORAL_TABLET | Freq: Every day | ORAL | Status: DC | PRN

## 2020-11-30 MED ORDER — ALTEPLASE 2 MG IJ SOLR
2.0000 mg | Freq: Once | INTRAMUSCULAR | Status: DC | PRN
  Filled 2020-11-30: qty 2

## 2020-11-30 MED ORDER — LIDOCAINE-PRILOCAINE 2.5-2.5 % EX CREA
1.0000 "application " | TOPICAL_CREAM | CUTANEOUS | Status: DC | PRN
  Filled 2020-11-30: qty 5

## 2020-11-30 MED ORDER — PRAVASTATIN SODIUM 20 MG PO TABS
20.0000 mg | ORAL_TABLET | Freq: Every day | ORAL | Status: DC
  Administered 2020-11-30 – 2020-12-02 (×3): 20 mg via ORAL
  Filled 2020-11-30 (×3): qty 1

## 2020-11-30 MED ORDER — LIDOCAINE HCL (PF) 1 % IJ SOLN
5.0000 mL | INTRAMUSCULAR | Status: DC | PRN
  Filled 2020-11-30: qty 5

## 2020-11-30 MED ORDER — SEVELAMER CARBONATE 800 MG PO TABS
1600.0000 mg | ORAL_TABLET | Freq: Three times a day (TID) | ORAL | Status: DC
  Administered 2020-12-01 – 2020-12-02 (×3): 1600 mg via ORAL
  Filled 2020-11-30 (×3): qty 2

## 2020-11-30 MED ORDER — HEPARIN SODIUM (PORCINE) 1000 UNIT/ML DIALYSIS
1000.0000 [IU] | INTRAMUSCULAR | Status: DC | PRN
  Administered 2020-11-30: 3800 [IU] via INTRAVENOUS_CENTRAL
  Filled 2020-11-30 (×2): qty 1

## 2020-11-30 MED ORDER — ACETAMINOPHEN 325 MG PO TABS
650.0000 mg | ORAL_TABLET | Freq: Four times a day (QID) | ORAL | Status: DC | PRN
  Administered 2020-11-30: 650 mg via ORAL
  Filled 2020-11-30: qty 2

## 2020-11-30 MED ORDER — CEPHALEXIN 500 MG PO CAPS
500.0000 mg | ORAL_CAPSULE | Freq: Two times a day (BID) | ORAL | Status: DC
  Administered 2020-11-30 – 2020-12-02 (×4): 500 mg via ORAL
  Filled 2020-11-30 (×4): qty 1

## 2020-11-30 MED ORDER — DOCUSATE SODIUM 100 MG PO CAPS
100.0000 mg | ORAL_CAPSULE | Freq: Every day | ORAL | Status: DC
  Administered 2020-12-02: 100 mg via ORAL
  Filled 2020-11-30 (×2): qty 1

## 2020-11-30 MED ORDER — TIMOLOL MALEATE 0.5 % OP SOLN
1.0000 [drp] | Freq: Every morning | OPHTHALMIC | Status: DC
  Administered 2020-12-01 – 2020-12-02 (×2): 1 [drp] via OPHTHALMIC
  Filled 2020-11-30: qty 5

## 2020-11-30 MED ORDER — CHLORHEXIDINE GLUCONATE CLOTH 2 % EX PADS
6.0000 | MEDICATED_PAD | Freq: Every day | CUTANEOUS | Status: DC
  Administered 2020-11-30 – 2020-12-02 (×2): 6 via TOPICAL
  Filled 2020-11-30: qty 6

## 2020-11-30 NOTE — H&P (Addendum)
History and Physical    [name] SHF:[id number] DOB: 03-11-1946 DOA: 11/30/2020  Referring MD/NP/PA:   PCP: Crissie Figures, PA-C   Patient coming from:  The patient is coming from home.  At baseline, pt is independent for most of ADL.        Chief Complaint: Weakness, dizziness  HPI: Kelli Nguyen is a 75 y.o. female with medical history significant of ESRD-HD (TTS), hypertension, hyperlipidemia, diabetes mellitus, GERD, right bundle blockage, pulmonary hypertension, dCHF, PVD, CAD, anemia, who presents with weakness and dizziness.  Patient states that in the past 5 days, she has been having generalized weakness, dizziness, lightheadedness.  She feels like she is going to pass out but did not.  No unilateral numbness or tinglings in extremities, no facial droop or slurred speech.  Denies chest pain and shortness of breath.  She has mild dry cough. No fever or chills.  She does not have nausea, vomiting, diarrhea or abdominal pain. Patient denies dark stool or rectal bleeding.  She states that her blood pressure has been low.   Of note, pt recently had vascular surgery for HERO vascular graft placement to right arm on 11/24/20. Before the procedure, she had dizziness and generalized weakness. She was evaluated in ED and found that her hemoglobin was lower than it was a few weeks ago before the surgery.  The ED physician spoke with the vascular surgeon who said she should be fine. Pt states that she is taking antibiotics chronically for infection of prosthetic left hip joint. She is following up with infectious disease in [facility].  ED Course: pt was found to have hemoglobin 7.7 (10.5 on 11/17/2020, 12.6 on 11/24/2020, 8.2 on 11/25/2020), negative FOBT, WBC 10.1, negative COVID PCR, INR 1.1, troponin level 17, 18, potassium 3.4, bicarbonate 27, creatinine 5.65, BUN 26, temperature normal, blood pressure 101/56, heart rate 88, RR 19, oxygen saturation 99% on room air.  Patient is placed on MedSurg  bed for observation.  Dr. Holley Raring of renal is consulted.  Review of Systems:   General: no fevers, chills, no body weight gain,  has fatigue HEENT: no blurry vision, hearing changes or sore throat Respiratory: no dyspnea, has coughing, no wheezing CV: no chest pain, no palpitations GI: no nausea, vomiting, abdominal pain, diarrhea, constipation GU: no dysuria, burning on urination, increased urinary frequency, hematuria  Ext: has leg edema Neuro: no unilateral weakness, numbness, or tingling, no vision change or hearing loss. Has dizziness and lightheadedness Skin: no rash, no skin tear. MSK: No muscle spasm, no deformity, no limitation of range of movement in spin.  Has left hip pain. Heme: No easy bruising.  Travel history: No recent long distant travel.  Allergy:  Allergies  Allergen Reactions   Penicillins Hives, Itching and Swelling    Has patient had a PCN reaction causing immediate rash, facial/tongue/throat swelling, SOB or lightheadedness with hypotension: No Has patient had a PCN reaction causing severe rash involving mucus membranes or skin necrosis: No Has patient had a PCN reaction that required hospitalization: No Has patient had a PCN reaction occurring within the last 10 years: No If all of the above answers are "NO", then may proceed with Cephalosporin use.  Penicillin allergy assessment completed on 03/22/20. Patient has received and tolerated cepholosporins incuding cefepime, ceftriaxone.    Codeine Hives   Codeine Sulfate Nausea Only   Contrast Media [Iodinated Diagnostic Agents] Hives   Gadobutrol Hives   Gadolinium Derivatives Hives   Metrizamide Hives   Statins  Other (See Comments)    muscle pain    Past Medical History:  Diagnosis Date   Anemia    Carpal tunnel syndrome of left wrist    Cataract, bilateral    Chronic cough    Closed displaced fracture of left femoral neck ([place]) 03/09/2017   Coronary artery disease    s/p 3v CABG (LIMA-LAD, SVG-OM1,  SVG-PDA) in 2008   Diabetes mellitus without complication (HCC)    ESRD (end stage renal disease) on dialysis ([place])    T-TH-SAT   GERD (gastroesophageal reflux disease)    Grade II diastolic dysfunction    HLD (hyperlipidemia)    Hypertension    Murmur    OA (osteoarthritis)    PAD (peripheral artery disease) (HCC)    Pneumonia    Polyneuropathy    feet and hands   Pulmonary hypertension ([place])    RBBB    Secondary hyperparathyroidism of renal origin ([place])    Sepsis ([place]) 06/28/2017   Wears dentures    partial upper and lower    Past Surgical History:  Procedure Laterality Date   A/V SHUNT INTERVENTION N/A 10/16/2016   Procedure: A/V Shunt Intervention;  Surgeon: Katha Cabal, MD;  Location: [place] [facility];  Service: Cardiovascular;  Laterality: N/A;   A/V SHUNTOGRAM Left 10/16/2016   Procedure: A/V Shuntogram;  Surgeon: Katha Cabal, MD;  Location: [place] [facility];  Service: Cardiovascular;  Laterality: Left;   ABDOMINAL HYSTERECTOMY     CARDIAC CATHETERIZATION N/A 02/26/2014   CARPAL TUNNEL RELEASE Left 09/27/2015   Procedure: CARPAL TUNNEL RELEASE;  Surgeon: Leanor Kail, MD;  Location: [facility];  Service: Orthopedics;  Laterality: Left;   CATARACT EXTRACTION W/PHACO Right 09/23/2016   Procedure: CATARACT EXTRACTION PHACO AND INTRAOCULAR LENS PLACEMENT (IOC) complicated diabetic right;  Surgeon: Ronnell Freshwater, MD;  Location: [place];  Service: Ophthalmology;  Laterality: Right;  Diabetic - diet controlled Potassium draw before surgery   CATARACT EXTRACTION W/PHACO Left 01/20/2017   Procedure: CATARACT EXTRACTION PHACO AND INTRAOCULAR LENS PLACEMENT ([place]) LEFT DIABETIC;  Surgeon: Eulogio Bear, MD;  Location: [place];  Service: Ophthalmology;  Laterality: Left;  Diabetic - diet controlled Potassium draw prior to procedure  8:00 ARRIVAL   COLONOSCOPY     COLONOSCOPY WITH PROPOFOL N/A 11/28/2014   Procedure:  COLONOSCOPY WITH PROPOFOL;  Surgeon: Manya Silvas, MD;  Location: [facility];  Service: Endoscopy;  Laterality: N/A;   CORONARY ARTERY BYPASS GRAFT N/A 2008   3v; LIMA-LAD, SVG-OM1, SVG-PDA; Location: [facility]; Surgeon: Jannett Celestine, MD   DIALYSIS/PERMA CATHETER INSERTION Left 01/21/2019   Procedure: DIALYSIS/PERMA CATHETER EXCHANGE;  Surgeon: Algernon Huxley, MD;  Location: [place] [facility];  Service: Cardiovascular;  Laterality: Left;   HEMIARTHROPLASTY HIP Left 2018   INSERTION OF DIALYSIS CATHETER Left 01/19/2019   Procedure: INSERTION OF DIALYSIS CATHETER;  Surgeon: Katha Cabal, MD;  Location: [facility];  Service: Vascular;  Laterality: Left;   LIGATIONS OF HERO GRAFT Left 01/19/2019   Procedure: EXCISION LEFT HERO GRAFT;  Surgeon: Katha Cabal, MD;  Location: [facility];  Service: Vascular;  Laterality: Left;   NEPHRECTOMY Left    PERIPHERAL VASCULAR CATHETERIZATION Left 01/18/2015   Procedure: A/V Shuntogram/Fistulagram;  Surgeon: Katha Cabal, MD;  Location: [place] [facility];  Service: Cardiovascular;  Laterality: Left;   PERIPHERAL VASCULAR CATHETERIZATION Left 01/18/2015   Procedure: A/V Shunt Intervention;  Surgeon: Katha Cabal, MD;  Location: [place] [facility];  Service: Cardiovascular;  Laterality: Left;   PERIPHERAL VASCULAR CATHETERIZATION N/A 08/10/2015   Procedure: Thrombectomy;  Surgeon: Algernon Huxley, MD;  Location: [place] [facility];  Service: Cardiovascular;  Laterality: N/A;   PERIPHERAL VASCULAR CATHETERIZATION N/A 08/10/2015   Procedure: A/V Shuntogram/Fistulagram;  Surgeon: Algernon Huxley, MD;  Location: [place] [facility];  Service: Cardiovascular;  Laterality: N/A;   PERIPHERAL VASCULAR CATHETERIZATION N/A 08/10/2015   Procedure: A/V Shunt Intervention;  Surgeon: Algernon Huxley, MD;  Location: [place] [facility];  Service: Cardiovascular;  Laterality: N/A;   PERIPHERAL VASCULAR THROMBECTOMY Left 01/12/2019   Procedure: PERIPHERAL  VASCULAR THROMBECTOMY;  Surgeon: Katha Cabal, MD;  Location: [place] [facility];  Service: Cardiovascular;  Laterality: Left;   REMOVAL OF A DIALYSIS CATHETER Right 01/19/2019   Procedure: REMOVAL OF A DIALYSIS CATHETER;  Surgeon: Katha Cabal, MD;  Location: [facility];  Service: Vascular;  Laterality: Right;   RENAL SHUNTS     UPPER EXTREMITY VENOGRAPHY Right 03/20/2020   Procedure: UPPER EXTREMITY VENOGRAPHY;  Surgeon: Algernon Huxley, MD;  Location: [place] [facility];  Service: Cardiovascular;  Laterality: Right;   VASCULAR ACCESS DEVICE INSERTION Right 11/24/2020   Procedure: INSERTION OF HERO VASCULAR ACCESS DEVICE (GRAFT);  Surgeon: Katha Cabal, MD;  Location: [facility];  Service: Vascular;  Laterality: Right;    Social History:  reports that she has never smoked. She has never used smokeless tobacco. She reports that she does not drink alcohol and does not use drugs.  Family History:  Family History  Problem Relation Age of Onset   Hypertension Sister      Prior to Admission medications   Medication Sig Start Date End Date Taking? Authorizing Provider  acetaminophen (TYLENOL) 500 MG tablet Take 500 mg by mouth every 6 (six) hours as needed for moderate pain or headache.    [provider]  aspirin 81 MG tablet Take 81 mg by mouth daily.    [provider]  cephALEXin (KEFLEX) 500 MG capsule Take 500 mg by mouth 2 (two) times daily. 10/30/20   [provider]  cetirizine (ZYRTEC) 10 MG tablet Take 10 mg by mouth daily.    [provider]  cinacalcet (SENSIPAR) 30 MG tablet Take 30 mg by mouth Every Tuesday,Thursday,and Saturday with dialysis.    [provider]  docusate sodium (COLACE) 100 MG capsule Take 100 mg by mouth daily.    [provider]  doxycycline (VIBRA-TABS) 100 MG tablet Take 100 mg by mouth 2 (two) times daily. 11/21/20   [provider]  HYDROcodone-acetaminophen (NORCO) 5-325 MG tablet  Take 1-2 tablets by mouth every 6 (six) hours as needed for moderate pain or severe pain. 11/24/20   Schnier, Dolores Lory, MD  latanoprost (XALATAN) 0.005 % ophthalmic solution Place 1 drop into both eyes at bedtime. 07/28/15   [provider]  pantoprazole (PROTONIX) 40 MG tablet Take 40 mg by mouth daily. 05/22/20   [provider]  pravastatin (PRAVACHOL) 20 MG tablet Take 20 mg by mouth daily.    [provider]  sevelamer carbonate (RENVELA) 800 MG tablet Take 1,600 mg by mouth 3 (three) times daily with meals. 07/13/13   [provider]  Timolol Maleate 0.5 % (DAILY) SOLN Place 1 drop into both eyes every morning. 04/12/19   [provider]    Physical Exam: Vitals:   11/30/20 1045 11/30/20 1108 11/30/20 1115 11/30/20 1130  BP:  (!) 100/51 (!) 97/55 [name])  97/52  Pulse:      Resp: 16  14 17   Temp:  98.6 F (37 C)    TempSrc:  Oral    SpO2:  100% 99% 100%  Weight:      Height:       General: Not in acute distress HEENT:       Eyes: PERRL, EOMI, no scleral icterus.       ENT: No discharge from the ears and nose, no pharynx injection, no tonsillar enlargement.        Neck: No JVD, no bruit, no mass felt. Heme: No neck lymph node enlargement. Cardiac: S1/S2, RRR, No gallops or rubs. Respiratory: No rales, wheezing, rhonchi or rubs. GI: Soft, nondistended, nontender, no rebound pain, no organomegaly, BS present. GU: No hematuria Ext: has trace leg edema bilaterally. 1+DP/PT pulse bilaterally. Musculoskeletal: No joint deformities, No joint redness or warmth, no limitation of ROM in spin. Skin: No rashes.  Neuro: Alert, oriented X3, cranial nerves II-XII grossly intact, moves all extremities. Psych: Patient is not psychotic, no suicidal or hemocidal ideation.  Labs on Admission: I have personally reviewed following labs and imaging studies  CBC: Recent Labs  Lab 11/24/20 0642 11/25/20 0929 11/30/20 0035  WBC  --  13.0* 10.1  HGB 12.6  8.2* 7.7*  HCT 37.0 25.7* 24.2*  MCV  --  86.5 86.4  PLT  --  188 245   Basic Metabolic Panel: Recent Labs  Lab 11/24/20 0642 11/25/20 0929 11/30/20 0035  NA 139 136 135  K 3.7 3.7 3.4*  CL 99 97* 92*  CO2  --  27 27  GLUCOSE 124* 126* 131*  BUN 29* 46* 26*  CREATININE 5.90* 7.38* 5.65*  CALCIUM  --  8.0* 8.0*   GFR: Estimated Creatinine Clearance: 7.5 mL/min (A) (by C-G formula based on SCr of 5.65 mg/dL (H)). Liver Function Tests: Recent Labs  Lab 11/25/20 0929  AST 20  ALT 10  ALKPHOS 55  BILITOT 0.9  PROT 7.1  ALBUMIN 3.8   No results for input(s): LIPASE, AMYLASE in the last 168 hours. No results for input(s): AMMONIA in the last 168 hours. Coagulation Profile: Recent Labs  Lab 11/25/20 1236 11/30/20 0523  INR 1.1 1.1   Cardiac Enzymes: No results for input(s): CKTOTAL, CKMB, CKMBINDEX, TROPONINI in the last 168 hours. BNP (last 3 results) No results for input(s): PROBNP in the last 8760 hours. HbA1C: No results for input(s): HGBA1C in the last 72 hours. CBG: Recent Labs  Lab 11/24/20 1134  GLUCAP 143*   Lipid Profile: No results for input(s): CHOL, HDL, LDLCALC, TRIG, CHOLHDL, LDLDIRECT in the last 72 hours. Thyroid Function Tests: No results for input(s): TSH, T4TOTAL, FREET4, T3FREE, THYROIDAB in the last 72 hours. Anemia Panel: Recent Labs    11/30/20 0035 11/30/20 0258  FOLATE  --  12.4  FERRITIN  --  1,275*  TIBC  --  224*  IRON  --  49  RETICCTPCT 2.9  --    Urine analysis: No results found for: COLORURINE, APPEARANCEUR, LABSPEC, PHURINE, GLUCOSEU, HGBUR, BILIRUBINUR, KETONESUR, PROTEINUR, UROBILINOGEN, NITRITE, LEUKOCYTESUR Sepsis Labs: @LABRCNTIP (procalcitonin:4,lacticidven:4) ) Recent Results (from the past 240 hour(s))  Resp Panel by RT-PCR (Flu A&B, Covid) Nasopharyngeal Swab     Status: None   Collection Time: 11/25/20 12:36 PM   Specimen: Nasopharyngeal Swab; Nasopharyngeal(NP) swabs in vial transport medium  Result  Value Ref Range Status   SARS Coronavirus 2 by RT PCR NEGATIVE NEGATIVE Final    Comment: (  NOTE) SARS-CoV-2 target nucleic acids are NOT DETECTED.  The SARS-CoV-2 RNA is generally detectable in upper respiratory specimens during the acute phase of infection. The lowest concentration of SARS-CoV-2 viral copies this assay can detect is 138 copies/mL. A negative result does not preclude SARS-Cov-2 infection and should not be used as the sole basis for treatment or other patient management decisions. A negative result may occur with  improper specimen collection/handling, submission of specimen other than nasopharyngeal swab, presence of viral mutation(s) within the areas targeted by this assay, and inadequate number of viral copies(<138 copies/mL). A negative result must be combined with clinical observations, patient history, and epidemiological information. The expected result is Negative.  Fact Sheet for Patients:  EntrepreneurPulse.com.au  Fact Sheet for Healthcare Providers:  IncredibleEmployment.be  This test is no t yet approved or cleared by the [place] FDA and  has been authorized for detection and/or diagnosis of SARS-CoV-2 by FDA under an Emergency Use Authorization (EUA). This EUA will remain  in effect (meaning this test can be used) for the duration of the COVID-19 declaration under Section 564(b)(1) of the Act, 21 U.S.C.section 360bbb-3(b)(1), unless the authorization is terminated  or revoked sooner.       Influenza A by PCR NEGATIVE NEGATIVE Final   Influenza B by PCR NEGATIVE NEGATIVE Final    Comment: (NOTE) The Xpert Xpress SARS-CoV-2/FLU/RSV plus assay is intended as an aid in the diagnosis of influenza from Nasopharyngeal swab specimens and should not be used as a sole basis for treatment. Nasal washings and aspirates are unacceptable for Xpert Xpress SARS-CoV-2/FLU/RSV testing.  Fact Sheet for  Patients: EntrepreneurPulse.com.au  Fact Sheet for Healthcare Providers: IncredibleEmployment.be  This test is not yet approved or cleared by the [place] FDA and has been authorized for detection and/or diagnosis of SARS-CoV-2 by FDA under an Emergency Use Authorization (EUA). This EUA will remain in effect (meaning this test can be used) for the duration of the COVID-19 declaration under Section 564(b)(1) of the Act, 21 U.S.C. section 360bbb-3(b)(1), unless the authorization is terminated or revoked.  Performed at [facility], [address] [place], [place] [address]   Blood culture (routine single)     Status: None   Collection Time: 11/25/20 12:36 PM   Specimen: BLOOD LEFT ARM  Result Value Ref Range Status   Specimen Description BLOOD LEFT ARM  Final   Special Requests   Final    BOTTLES DRAWN AEROBIC AND ANAEROBIC Blood Culture results may not be optimal due to an inadequate volume of blood received in culture bottles   Culture   Final    NO GROWTH 5 DAYS Performed at [facility], [address] [place], [place] [address]    Report Status 11/30/2020 FINAL  Final  Resp Panel by RT-PCR (Flu A&B, Covid) Nasopharyngeal Swab     Status: None   Collection Time: 11/30/20  5:23 AM   Specimen: Nasopharyngeal Swab; Nasopharyngeal(NP) swabs in vial transport medium  Result Value Ref Range Status   SARS Coronavirus 2 by RT PCR NEGATIVE NEGATIVE Final    Comment: (NOTE) SARS-CoV-2 target nucleic acids are NOT DETECTED.  The SARS-CoV-2 RNA is generally detectable in upper respiratory specimens during the acute phase of infection. The lowest concentration of SARS-CoV-2 viral copies this assay can detect is 138 copies/mL. A negative result does not preclude SARS-Cov-2 infection and should not be used as the sole basis for treatment or other patient management decisions. A negative result may occur with  improper  specimen collection/handling, submission of specimen other than nasopharyngeal swab, presence of viral mutation(s) within the areas targeted by this assay, and inadequate number of viral copies(<138 copies/mL). A negative result must be combined with clinical observations, patient history, and epidemiological information. The expected result is Negative.  Fact Sheet for Patients:  EntrepreneurPulse.com.au  Fact Sheet for Healthcare Providers:  IncredibleEmployment.be  This test is no t yet approved or cleared by the [place] FDA and  has been authorized for detection and/or diagnosis of SARS-CoV-2 by FDA under an Emergency Use Authorization (EUA). This EUA will remain  in effect (meaning this test can be used) for the duration of the COVID-19 declaration under Section 564(b)(1) of the Act, 21 U.S.C.section 360bbb-3(b)(1), unless the authorization is terminated  or revoked sooner.       Influenza A by PCR NEGATIVE NEGATIVE Final   Influenza B by PCR NEGATIVE NEGATIVE Final    Comment: (NOTE) The Xpert Xpress SARS-CoV-2/FLU/RSV plus assay is intended as an aid in the diagnosis of influenza from Nasopharyngeal swab specimens and should not be used as a sole basis for treatment. Nasal washings and aspirates are unacceptable for Xpert Xpress SARS-CoV-2/FLU/RSV testing.  Fact Sheet for Patients: EntrepreneurPulse.com.au  Fact Sheet for Healthcare Providers: IncredibleEmployment.be  This test is not yet approved or cleared by the [place] FDA and has been authorized for detection and/or diagnosis of SARS-CoV-2 by FDA under an Emergency Use Authorization (EUA). This EUA will remain in effect (meaning this test can be used) for the duration of the COVID-19 declaration under Section 564(b)(1) of the Act, 21 U.S.C. section 360bbb-3(b)(1), unless the authorization is terminated or revoked.  Performed at  [facility], [address] [place], [place] [address]      Radiological Exams on Admission: No results found.   EKG: I have personally reviewed.  Sinus rhythm, QTC 505, RAD, right bundle blockage, low voltage, early R wave progression  Assessment/Plan Principal Problem:   Symptomatic anemia Active Problems:   End stage renal failure on dialysis (HCC)   Hyperlipidemia   CAD (coronary artery disease)   Diabetes mellitus with kidney complication (HCC)   Anemia in ESRD (end-stage renal disease) (HCC)   HTN (hypertension)   Chronic diastolic CHF (congestive heart failure) (HCC)   Hypokalemia   Elevated troponin   Prosthetic joint infection of left hip (HCC)   Symptomatic anemia and anemia in ESRD (end-stage renal disease) : Patient's lightheadedness and dizziness are likely due to symptomatic anemia.  No focal neurodeficit on physical examination.  Low suspicions for stroke.  Hemoglobin dropped from 10.5 to 7.7.  Patient does not have rectal bleeding or dark stool.  FOBT is negative.  Does not seem to have GI bleeding.  His anemia is likely due to ESRD.  -will place on med-surg bed for obs -transfuse 1 unit of blood -check anemia panel -PT/OT  End stage renal failure on dialysis (TTS) -Dr. Holley Raring of renal is consulted for HD  Hyperlipidemia -Pravastatin  CAD (coronary artery disease) and elevated trop: Trop 17 -->18. No chest pain.  Minimally elevated troponins likely due to decreased clearance secondary to ESRD. -Aspirin, pravastatin  Diet controlled diabetes mellitus with kidney complication ([place]): No H0W on record.  Blood sugar 131.  Patient not taking medications currently -Check blood sugar every morning  HTN (hypertension): Blood pressure soft.  Patient is not taking medications currently -Monitor blood pressure closely  Chronic diastolic CHF (congestive heart failure) ([place]): 2D echo on 09/20/2020 showed  EF of 55 to 60% with grade 2 diastolic dysfunction.   Patient has trace leg edema, no shortness of breath, CHF seem to be compensated. -Volume management per renal by dialysis  Hypokalemia: Potassium 3.4 -Expecting correction with dialysis  Prosthetic joint infection of left hip [facility]): Patient taking Keflex chronically.  No fever or leukocytosis.  No sepsis. -Continue home Keflex          DVT ppx: SCD Code Status: Full code Family Communication:  Yes, patient's   daughter by phone Disposition Plan:  Anticipate discharge back to previous environment Consults called:  Dr. Holley Raring of renal Admission status and Level of care: Med-Surg:    for obs        Status is: Observation  The patient remains OBS appropriate and will d/c before 2 midnights.  Dispo: The patient is from: Home              Anticipated d/c is to: Home              Patient currently is not medically stable to d/c.   Difficult to place patient No          Date of Service 11/30/2020    [facility]   If 7PM-7AM, please contact night-coverage [email or web address] 11/30/2020, 11:38 AM

## 2020-11-30 NOTE — ED Notes (Signed)
IV team at the bedside. 

## 2020-11-30 NOTE — ED Triage Notes (Signed)
Pt to triage via w/c with no distress noted; reports that her daughter was concerned over BP at home and st she had dizziness tonight; dialysis sched for morning

## 2020-11-30 NOTE — ED Notes (Signed)
Reviewed pt's results; acuity level changed; charge nurse notified

## 2020-11-30 NOTE — Consult Note (Signed)
[facility]  CONSULT NOTE    Date: 11/30/2020                  Patient Name:  Kelli Nguyen  MRN: [id number]  DOB: [birth year]  Age / Sex: 75 y.o., female         PCP: Crissie Figures, PA-C                 Service Requesting Consult: [place]                 Reason for Consult: ESRD            History of Present Illness: [name] is a 75 y.o.  female with HLD, hypertension, diabetes, pulmonary hypertension, dCHF anemia and ESRD on dialysis, who was admitted to [facility] on 11/30/2020 for Symptomatic anemia [D64.9]   Patient currently receives dialysis at [facility] under the supervision of [facility]. She presents to the ED with complains of dizziness and weakness she felt was from low blood pressures. Her daughter encouraged her to come to ED. Denies nausea, vomiting, and diarrhea. Denies shortness of breath but has a cough. Denies dark stool or any signs of bleeding. Hgb found to be 7.7 on arrival, baseline 12.6 on 11/17/20. Recent Hero graft placement on 11/24/20, but symptomatic prior to procedure.   We have been consulted to manage dialysis during this admission.    Medications: Outpatient medications: (Not in a hospital admission)   Current medications: Current Facility-Administered Medications  Medication Dose Route Frequency Provider Last Rate Last Admin   0.9 %  sodium chloride infusion  10 mL/hr Intravenous Once Hinda Kehr, MD       0.9 %  sodium chloride infusion  100 mL Intravenous PRN Lateef, Munsoor, MD       0.9 %  sodium chloride infusion  100 mL Intravenous PRN Lateef, Munsoor, MD       acetaminophen (TYLENOL) tablet 650 mg  650 mg Oral Q6H PRN Ivor Costa, MD       alteplase (CATHFLO ACTIVASE) injection 2 mg  2 mg Intracatheter Once PRN Lateef, Munsoor, MD       Chlorhexidine Gluconate Cloth 2 % PADS 6 each  6 each Topical Q0600 Lateef, Munsoor, MD       heparin injection 1,000 Units  1,000 Units Dialysis PRN Lateef, Munsoor, MD        hydrOXYzine (ATARAX/VISTARIL) tablet 10 mg  10 mg Oral TID PRN Ivor Costa, MD       lidocaine (PF) (XYLOCAINE) 1 % injection 5 mL  5 mL Intradermal PRN Lateef, Munsoor, MD       lidocaine-prilocaine (EMLA) cream 1 application  1 application Topical PRN Lateef, Munsoor, MD       pentafluoroprop-tetrafluoroeth (GEBAUERS) aerosol 1 application  1 application Topical PRN Lateef, Munsoor, MD       Current Outpatient Medications  Medication Sig Dispense Refill   aspirin 81 MG tablet Take 81 mg by mouth daily.     cephALEXin (KEFLEX) 500 MG capsule Take 500 mg by mouth 2 (two) times daily.     latanoprost (XALATAN) 0.005 % ophthalmic solution Place 1 drop into both eyes at bedtime.     pantoprazole (PROTONIX) 40 MG tablet Take 40 mg by mouth daily.     pravastatin (PRAVACHOL) 20 MG tablet Take 20 mg by mouth daily.     sevelamer carbonate (RENVELA) 800 MG tablet Take 1,600 mg by mouth 3 (three) times  daily with meals.     Timolol Maleate 0.5 % (DAILY) SOLN Place 1 drop into both eyes every morning.     acetaminophen (TYLENOL) 500 MG tablet Take 500 mg by mouth every 6 (six) hours as needed for moderate pain or headache.     cetirizine (ZYRTEC) 10 MG tablet Take 10 mg by mouth daily.     cinacalcet (SENSIPAR) 30 MG tablet Take 30 mg by mouth Every Tuesday,Thursday,and Saturday with dialysis.     docusate sodium (COLACE) 100 MG capsule Take 100 mg by mouth daily.     doxycycline (VIBRA-TABS) 100 MG tablet Take 100 mg by mouth 2 (two) times daily. (Patient not taking: No sig reported)     HYDROcodone-acetaminophen (NORCO) 5-325 MG tablet Take 1-2 tablets by mouth every 6 (six) hours as needed for moderate pain or severe pain. 40 tablet 0      Allergies: Allergies  Allergen Reactions   Penicillins Hives, Itching and Swelling    Has patient had a PCN reaction causing immediate rash, facial/tongue/throat swelling, SOB or lightheadedness with hypotension: No Has patient had a PCN reaction causing  severe rash involving mucus membranes or skin necrosis: No Has patient had a PCN reaction that required hospitalization: No Has patient had a PCN reaction occurring within the last 10 years: No If all of the above answers are "NO", then may proceed with Cephalosporin use.  Penicillin allergy assessment completed on 03/22/20. Patient has received and tolerated cepholosporins incuding cefepime, ceftriaxone.    Codeine Hives   Codeine Sulfate Nausea Only   Contrast Media [Iodinated Diagnostic Agents] Hives   Gadobutrol Hives   Gadolinium Derivatives Hives   Metrizamide Hives   Statins Other (See Comments)    muscle pain      Past Medical History: Past Medical History:  Diagnosis Date   Anemia    Carpal tunnel syndrome of left wrist    Cataract, bilateral    Chronic cough    Closed displaced fracture of left femoral neck ([place]) 03/09/2017   Coronary artery disease    s/p 3v CABG (LIMA-LAD, SVG-OM1, SVG-PDA) in 2008   Diabetes mellitus without complication (HCC)    ESRD (end stage renal disease) on dialysis ([place])    T-TH-SAT   GERD (gastroesophageal reflux disease)    Grade II diastolic dysfunction    HLD (hyperlipidemia)    Hypertension    Murmur    OA (osteoarthritis)    PAD (peripheral artery disease) (HCC)    Pneumonia    Polyneuropathy    feet and hands   Pulmonary hypertension (HCC)    RBBB    Secondary hyperparathyroidism of renal origin ([place])    Sepsis ([place]) 06/28/2017   Wears dentures    partial upper and lower     Past Surgical History: Past Surgical History:  Procedure Laterality Date   A/V SHUNT INTERVENTION N/A 10/16/2016   Procedure: A/V Shunt Intervention;  Surgeon: Katha Cabal, MD;  Location: [place] [facility];  Service: Cardiovascular;  Laterality: N/A;   A/V SHUNTOGRAM Left 10/16/2016   Procedure: A/V Shuntogram;  Surgeon: Katha Cabal, MD;  Location: [place] [facility];  Service: Cardiovascular;  Laterality: Left;   ABDOMINAL  HYSTERECTOMY     CARDIAC CATHETERIZATION N/A 02/26/2014   CARPAL TUNNEL RELEASE Left 09/27/2015   Procedure: CARPAL TUNNEL RELEASE;  Surgeon: Leanor Kail, MD;  Location: [facility];  Service: Orthopedics;  Laterality: Left;   CATARACT EXTRACTION W/PHACO Right 09/23/2016   Procedure: CATARACT EXTRACTION  PHACO AND INTRAOCULAR LENS PLACEMENT (IOC) complicated diabetic right;  Surgeon: Ronnell Freshwater, MD;  Location: [place];  Service: Ophthalmology;  Laterality: Right;  Diabetic - diet controlled Potassium draw before surgery   CATARACT EXTRACTION W/PHACO Left 01/20/2017   Procedure: CATARACT EXTRACTION PHACO AND INTRAOCULAR LENS PLACEMENT ([place]) LEFT DIABETIC;  Surgeon: Eulogio Bear, MD;  Location: [place];  Service: Ophthalmology;  Laterality: Left;  Diabetic - diet controlled Potassium draw prior to procedure  8:00 ARRIVAL   COLONOSCOPY     COLONOSCOPY WITH PROPOFOL N/A 11/28/2014   Procedure: COLONOSCOPY WITH PROPOFOL;  Surgeon: Manya Silvas, MD;  Location: [facility];  Service: Endoscopy;  Laterality: N/A;   CORONARY ARTERY BYPASS GRAFT N/A 2008   3v; LIMA-LAD, SVG-OM1, SVG-PDA; Location: [facility]; Surgeon: Jannett Celestine, MD   DIALYSIS/PERMA CATHETER INSERTION Left 01/21/2019   Procedure: DIALYSIS/PERMA CATHETER EXCHANGE;  Surgeon: Algernon Huxley, MD;  Location: [place] [facility];  Service: Cardiovascular;  Laterality: Left;   HEMIARTHROPLASTY HIP Left 2018   INSERTION OF DIALYSIS CATHETER Left 01/19/2019   Procedure: INSERTION OF DIALYSIS CATHETER;  Surgeon: Katha Cabal, MD;  Location: [facility];  Service: Vascular;  Laterality: Left;   LIGATIONS OF HERO GRAFT Left 01/19/2019   Procedure: EXCISION LEFT HERO GRAFT;  Surgeon: Katha Cabal, MD;  Location: [facility];  Service: Vascular;  Laterality: Left;   NEPHRECTOMY Left    PERIPHERAL VASCULAR CATHETERIZATION Left 01/18/2015   Procedure: A/V Shuntogram/Fistulagram;  Surgeon: Katha Cabal, MD;  Location: [place] [facility];  Service: Cardiovascular;  Laterality: Left;   PERIPHERAL VASCULAR CATHETERIZATION Left 01/18/2015   Procedure: A/V Shunt Intervention;  Surgeon: Katha Cabal, MD;  Location: [place] [facility];  Service: Cardiovascular;  Laterality: Left;   PERIPHERAL VASCULAR CATHETERIZATION N/A 08/10/2015   Procedure: Thrombectomy;  Surgeon: Algernon Huxley, MD;  Location: [place] [facility];  Service: Cardiovascular;  Laterality: N/A;   PERIPHERAL VASCULAR CATHETERIZATION N/A 08/10/2015   Procedure: A/V Shuntogram/Fistulagram;  Surgeon: Algernon Huxley, MD;  Location: [place] [facility];  Service: Cardiovascular;  Laterality: N/A;   PERIPHERAL VASCULAR CATHETERIZATION N/A 08/10/2015   Procedure: A/V Shunt Intervention;  Surgeon: Algernon Huxley, MD;  Location: [place] [facility];  Service: Cardiovascular;  Laterality: N/A;   PERIPHERAL VASCULAR THROMBECTOMY Left 01/12/2019   Procedure: PERIPHERAL VASCULAR THROMBECTOMY;  Surgeon: Katha Cabal, MD;  Location: [place] [facility];  Service: Cardiovascular;  Laterality: Left;   REMOVAL OF A DIALYSIS CATHETER Right 01/19/2019   Procedure: REMOVAL OF A DIALYSIS CATHETER;  Surgeon: Katha Cabal, MD;  Location: [facility];  Service: Vascular;  Laterality: Right;   RENAL SHUNTS     UPPER EXTREMITY VENOGRAPHY Right 03/20/2020   Procedure: UPPER EXTREMITY VENOGRAPHY;  Surgeon: Algernon Huxley, MD;  Location: [place] [facility];  Service: Cardiovascular;  Laterality: Right;   VASCULAR ACCESS DEVICE INSERTION Right 11/24/2020   Procedure: INSERTION OF HERO VASCULAR ACCESS DEVICE (GRAFT);  Surgeon: Katha Cabal, MD;  Location: [facility];  Service: Vascular;  Laterality: Right;     Family History: Family History  Problem Relation Age of Onset   Hypertension Sister      Social History: Social History   Socioeconomic History   Marital status: Widowed    Spouse name: Not on file   Number of children: 5    Years of education: Not on file   Highest education level: Not on file  Occupational History  Not on file  Tobacco Use   Smoking status: Never   Smokeless tobacco: Never  Vaping Use   Vaping Use: Never used  Substance and Sexual Activity   Alcohol use: No   Drug use: No   Sexual activity: Not on file  Other Topics Concern   Not on file  Social History Narrative   Lives with son   Social Determinants of Health   Financial Resource Strain: Not on file  Food Insecurity: Not on file  Transportation Needs: Not on file  Physical Activity: Not on file  Stress: Not on file  Social Connections: Not on file  Intimate Partner Violence: Not on file     Review of Systems: Review of Systems  Constitutional:  Positive for malaise/fatigue.  Respiratory:  Positive for cough.   Cardiovascular:  Positive for leg swelling.  Neurological:  Positive for dizziness and weakness.  All other systems reviewed and are negative.  Vital Signs: Blood pressure (!) 87/49, pulse 75, temperature 98.6 F (37 C), temperature source Oral, resp. rate 20, height 4\' 11"  (1.499 m), weight 73 kg, SpO2 96 %.  Weight trends: Filed Weights   11/30/20 0028  Weight: 73 kg    Physical Exam: General: NAD, laying on stretcher  Head: Normocephalic, atraumatic. Moist oral mucosal membranes  Eyes: Anicteric  Lungs:  Clear to auscultation, normal effort  Heart: Regular rate and rhythm  Abdomen:  Soft, nontender  Extremities:  1+ peripheral edema.  Neurologic: Nonfocal, moving all four extremities  Skin: No lesions  Access: Rt Permcath     Lab results: Basic Metabolic Panel: Recent Labs  Lab 11/24/20 0642 11/25/20 0929 11/30/20 0035  NA 139 136 135  K 3.7 3.7 3.4*  CL 99 97* 92*  CO2  --  27 27  GLUCOSE 124* 126* 131*  BUN 29* 46* 26*  CREATININE 5.90* 7.38* 5.65*  CALCIUM  --  8.0* 8.0*    Liver Function Tests: Recent Labs  Lab 11/25/20 0929  AST 20  ALT 10  ALKPHOS 55  BILITOT 0.9   PROT 7.1  ALBUMIN 3.8   No results for input(s): LIPASE, AMYLASE in the last 168 hours. No results for input(s): AMMONIA in the last 168 hours.  CBC: Recent Labs  Lab 11/24/20 0642 11/25/20 0929 11/30/20 0035  WBC  --  13.0* 10.1  HGB 12.6 8.2* 7.7*  HCT 37.0 25.7* 24.2*  MCV  --  86.5 86.4  PLT  --  188 241    Cardiac Enzymes: No results for input(s): CKTOTAL, CKMB, CKMBINDEX, TROPONINI in the last 168 hours.  BNP: Invalid input(s): POCBNP  CBG: Recent Labs  Lab 11/24/20 1134  GLUCAP 143*    Microbiology: Results for orders placed or performed during the hospital encounter of 11/30/20  Resp Panel by RT-PCR (Flu A&B, Covid) Nasopharyngeal Swab     Status: None   Collection Time: 11/30/20  5:23 AM   Specimen: Nasopharyngeal Swab; Nasopharyngeal(NP) swabs in vial transport medium  Result Value Ref Range Status   SARS Coronavirus 2 by RT PCR NEGATIVE NEGATIVE Final    Comment: (NOTE) SARS-CoV-2 target nucleic acids are NOT DETECTED.  The SARS-CoV-2 RNA is generally detectable in upper respiratory specimens during the acute phase of infection. The lowest concentration of SARS-CoV-2 viral copies this assay can detect is 138 copies/mL. A negative result does not preclude SARS-Cov-2 infection and should not be used as the sole basis for treatment or other patient management decisions. A negative result may occur  with  improper specimen collection/handling, submission of specimen other than nasopharyngeal swab, presence of viral mutation(s) within the areas targeted by this assay, and inadequate number of viral copies(<138 copies/mL). A negative result must be combined with clinical observations, patient history, and epidemiological information. The expected result is Negative.  Fact Sheet for Patients:  EntrepreneurPulse.com.au  Fact Sheet for Healthcare Providers:  IncredibleEmployment.be  This test is no t yet approved or  cleared by the [place] FDA and  has been authorized for detection and/or diagnosis of SARS-CoV-2 by FDA under an Emergency Use Authorization (EUA). This EUA will remain  in effect (meaning this test can be used) for the duration of the COVID-19 declaration under Section 564(b)(1) of the Act, 21 U.S.C.section 360bbb-3(b)(1), unless the authorization is terminated  or revoked sooner.       Influenza A by PCR NEGATIVE NEGATIVE Final   Influenza B by PCR NEGATIVE NEGATIVE Final    Comment: (NOTE) The Xpert Xpress SARS-CoV-2/FLU/RSV plus assay is intended as an aid in the diagnosis of influenza from Nasopharyngeal swab specimens and should not be used as a sole basis for treatment. Nasal washings and aspirates are unacceptable for Xpert Xpress SARS-CoV-2/FLU/RSV testing.  Fact Sheet for Patients: EntrepreneurPulse.com.au  Fact Sheet for Healthcare Providers: IncredibleEmployment.be  This test is not yet approved or cleared by the [place] FDA and has been authorized for detection and/or diagnosis of SARS-CoV-2 by FDA under an Emergency Use Authorization (EUA). This EUA will remain in effect (meaning this test can be used) for the duration of the COVID-19 declaration under Section 564(b)(1) of the Act, 21 U.S.C. section 360bbb-3(b)(1), unless the authorization is terminated or revoked.  Performed at [facility], [address] [place], [place] [address]     Coagulation Studies: Recent Labs    11/30/20 0523  LABPROT 14.0  INR 1.1    Urinalysis: No results for input(s): COLORURINE, LABSPEC, PHURINE, GLUCOSEU, HGBUR, BILIRUBINUR, KETONESUR, PROTEINUR, UROBILINOGEN, NITRITE, LEUKOCYTESUR in the last 72 hours.  Invalid input(s): APPERANCEUR    Imaging: No results found.   Assessment & Plan: Ms. RAEONNA MILO is a 75 y.o.  female with HLD, hypertension, diabetes, pulmonary hypertension, dCHF anemia and ESRD on  dialysis, who was admitted to [facility] on 11/30/2020 for Near syncope [R55] ESRD on hemodialysis ([place]) [N18.6, Z99.2] Symptomatic anemia [D64.9]   [facility] [address]/TTS/ Rt Permcath  End Stage Renal Disease on dialysis Will maintain outpatient schedule, if possible Received dialysis today, hypotension eliminated UF removal. Low to no UF removal ordered based on loss of volume. Became dizzy and received 243ml NS bolus. BP remained low but no other symptoms reported. May consider another treatment tomorrow based on fluid status, will evaluate tomorrow.   2. Anemia of chronic kidney disease  Lab Results  Component Value Date   HGB 7.7 (L) 11/30/2020   Hgb below goal Will order EPO with next treatment  3. Secondary Hyperparathyroidism:  Lab Results  Component Value Date   CALCIUM 8.0 (L) 11/30/2020   CAION 1.06 (L) 11/24/2020    Calcium low, will order Phosphorus with am labs No binders prescribed at this time     LOS: 0 Darrah Dredge 7/28/20222:25 PM

## 2020-11-30 NOTE — ED Notes (Signed)
MD at the bedside  

## 2020-11-30 NOTE — ED Provider Notes (Signed)
[facility] Emergency Department Provider Note  ____________________________________________   Event Date/Time   First MD Initiated Contact with Patient 11/30/20 0425     (approximate)  I have reviewed the triage vital signs and the nursing notes.   HISTORY  Chief Complaint Dizziness    HPI Kelli Nguyen is a 75 y.o. female who presents for evaluation of generalized weakness and near syncope and low blood pressure.  She has had these episodes for a few days.  She recently had vascular surgery to establish a different dialysis access port.  She was seen after that in the emergency department for similar episode where she passed out or almost passed out.  She was evaluated and found that her hemoglobin was considerably lower than it was a few weeks ago before the surgery.  The ED physician spoke with the vascular surgeon who said she should be fine and that the surgery did not result in a significant blood loss.  In the 5 days or so since the last visit, her symptoms of continued but not worsened.  She said that she feels generally tired and gets more tired and dizzy when she gets up and moves around.  She is denying shortness of breath.  She has not had any nausea or vomiting.  She denies chest pain.  She is due for dialysis this morning but is not feeling volume overloaded she has not had any swelling.  She has had no focal numbness or weakness.     Past Medical History:  Diagnosis Date   Anemia    Carpal tunnel syndrome of left wrist    Cataract, bilateral    Chronic cough    Closed displaced fracture of left femoral neck ([place]) 03/09/2017   Coronary artery disease    s/p 3v CABG (LIMA-LAD, SVG-OM1, SVG-PDA) in 2008   Diabetes mellitus without complication ([place])    ESRD (end stage renal disease) on dialysis ([place])    T-TH-SAT   GERD (gastroesophageal reflux disease)    Grade II diastolic dysfunction    HLD (hyperlipidemia)    Hypertension    Murmur     OA (osteoarthritis)    PAD (peripheral artery disease) (HCC)    Pneumonia    Polyneuropathy    feet and hands   Pulmonary hypertension (HCC)    RBBB    Secondary hyperparathyroidism of renal origin ([place])    Sepsis ([place]) 06/28/2017   Wears dentures    partial upper and lower    Patient Active Problem List   Diagnosis Date Noted   Symptomatic anemia 11/30/2020   Pulmonary hypertension, unspecified ([place]) 08/21/2020   Valvular heart disease 08/21/2020   Risk for falls 12/30/2019   Atherosclerosis of native arteries of extremity with intermittent claudication ([place]) 09/27/2019   Leg pain 09/06/2019   Pain due to onychomycosis of toenails of both feet 05/24/2019   Type 2 diabetes mellitus with vascular disease ([place]) 05/24/2019   Bacterial infection, unspecified 02/04/2019   ESRD (end stage renal disease) ([place]) 01/21/2019   Septic shock (HCC)    Infected prosthetic vascular graft ([place]) 01/19/2019   GERD (gastroesophageal reflux disease) 01/18/2019   Arthritis 01/18/2019   Chronic pain syndrome 01/18/2019   Constipation 01/18/2019   Glaucoma 01/18/2019   Muscle weakness 01/18/2019   Pain of left hip joint 01/18/2019   Pressure ulcer of sacral region 01/18/2019   Diabetes mellitus with kidney complication ([place]) 31/49/7026   Obesity (BMI 30-39.9) 11/12/2017   Red blood cell  antibody positive 11/12/2017   Sepsis, unspecified organism ([place]) 06/28/2017   Closed displaced fracture of left femoral neck ([place]) 03/09/2017   End stage renal failure on dialysis ([place]) 22/97/9892   Complication of vascular access for dialysis 03/17/2016   Superior vena cava compression syndrome 03/17/2016   Hyperlipidemia 03/17/2016   Moderate protein-calorie malnutrition ([place]) 03/11/2016   Bilateral hand numbness 02/22/2015   Bilateral hand pain 02/22/2015   Hand weakness 02/22/2015   History of adenomatous polyp of colon 10/12/2014   Pruritus, unspecified 07/25/2014   Left ventricular dysfunction  05/25/2014   Iron deficiency anemia, unspecified 04/25/2014   Iron deficiency anemia 04/25/2014   Carpal tunnel syndrome on right 04/07/2014   Allergy, unspecified, sequela 01/21/2014   Pain, unspecified 01/21/2014   Diarrhea, unspecified 01/21/2014   Cancer of kidney ([place]) 10/06/2013   Coagulation defect, unspecified ([place]) 07/21/2013   Fever, unspecified 07/21/2013   Headache, unspecified 07/21/2013   Shortness of breath 07/21/2013   Encounter for immunization 02/15/2013   Hypotension 11/10/2012   CAD (coronary artery disease) 11/10/2012   Coronary artery disease involving native coronary artery of native heart without angina pectoris 11/10/2012   Anemia in chronic kidney disease 06/26/2009   Dependence on renal dialysis ([place]) 06/26/2009   Secondary hyperparathyroidism of renal origin ([place]) 02/28/2003   Hypertensive chronic kidney disease with stage 5 chronic kidney disease or end stage renal disease ([place]) 04/01/2001   Type 2 diabetes mellitus without complications ([place]) 11/94/1740   End stage renal disease ([place]) 04/01/2001    Past Surgical History:  Procedure Laterality Date   A/V SHUNT INTERVENTION N/A 10/16/2016   Procedure: A/V Shunt Intervention;  Surgeon: Katha Cabal, MD;  Location: [place] [facility];  Service: Cardiovascular;  Laterality: N/A;   A/V SHUNTOGRAM Left 10/16/2016   Procedure: A/V Shuntogram;  Surgeon: Katha Cabal, MD;  Location: [place] [facility];  Service: Cardiovascular;  Laterality: Left;   ABDOMINAL HYSTERECTOMY     CARDIAC CATHETERIZATION N/A 02/26/2014   CARPAL TUNNEL RELEASE Left 09/27/2015   Procedure: CARPAL TUNNEL RELEASE;  Surgeon: Leanor Kail, MD;  Location: [facility];  Service: Orthopedics;  Laterality: Left;   CATARACT EXTRACTION W/PHACO Right 09/23/2016   Procedure: CATARACT EXTRACTION PHACO AND INTRAOCULAR LENS PLACEMENT (IOC) complicated diabetic right;  Surgeon: Ronnell Freshwater, MD;  Location: [place];  Service: Ophthalmology;  Laterality: Right;  Diabetic - diet controlled Potassium draw before surgery   CATARACT EXTRACTION W/PHACO Left 01/20/2017   Procedure: CATARACT EXTRACTION PHACO AND INTRAOCULAR LENS PLACEMENT ([employer]) LEFT DIABETIC;  Surgeon: Eulogio Bear, MD;  Location: [place];  Service: Ophthalmology;  Laterality: Left;  Diabetic - diet controlled Potassium draw prior to procedure  8:00 ARRIVAL   COLONOSCOPY     COLONOSCOPY WITH PROPOFOL N/A 11/28/2014   Procedure: COLONOSCOPY WITH PROPOFOL;  Surgeon: Manya Silvas, MD;  Location: [facility];  Service: Endoscopy;  Laterality: N/A;   CORONARY ARTERY BYPASS GRAFT N/A 2008   3v; LIMA-LAD, SVG-OM1, SVG-PDA; Location: [facility]; Surgeon: Jannett Celestine, MD   DIALYSIS/PERMA CATHETER INSERTION Left 01/21/2019   Procedure: DIALYSIS/PERMA CATHETER EXCHANGE;  Surgeon: Algernon Huxley, MD;  Location: [place] [facility];  Service: Cardiovascular;  Laterality: Left;   HEMIARTHROPLASTY HIP Left 2018   INSERTION OF DIALYSIS CATHETER Left 01/19/2019   Procedure: INSERTION OF DIALYSIS CATHETER;  Surgeon: Katha Cabal, MD;  Location: [facility];  Service: Vascular;  Laterality: Left;   LIGATIONS OF HERO GRAFT Left 01/19/2019   Procedure:  EXCISION LEFT HERO GRAFT;  Surgeon: Katha Cabal, MD;  Location: [facility];  Service: Vascular;  Laterality: Left;   NEPHRECTOMY Left    PERIPHERAL VASCULAR CATHETERIZATION Left 01/18/2015   Procedure: A/V Shuntogram/Fistulagram;  Surgeon: Katha Cabal, MD;  Location: [place] [facility];  Service: Cardiovascular;  Laterality: Left;   PERIPHERAL VASCULAR CATHETERIZATION Left 01/18/2015   Procedure: A/V Shunt Intervention;  Surgeon: Katha Cabal, MD;  Location: [place] [facility];  Service: Cardiovascular;  Laterality: Left;   PERIPHERAL VASCULAR CATHETERIZATION N/A 08/10/2015   Procedure: Thrombectomy;  Surgeon: Algernon Huxley, MD;  Location: [place] [facility];  Service:  Cardiovascular;  Laterality: N/A;   PERIPHERAL VASCULAR CATHETERIZATION N/A 08/10/2015   Procedure: A/V Shuntogram/Fistulagram;  Surgeon: Algernon Huxley, MD;  Location: [place] [facility];  Service: Cardiovascular;  Laterality: N/A;   PERIPHERAL VASCULAR CATHETERIZATION N/A 08/10/2015   Procedure: A/V Shunt Intervention;  Surgeon: Algernon Huxley, MD;  Location: [place] [facility];  Service: Cardiovascular;  Laterality: N/A;   PERIPHERAL VASCULAR THROMBECTOMY Left 01/12/2019   Procedure: PERIPHERAL VASCULAR THROMBECTOMY;  Surgeon: Katha Cabal, MD;  Location: [place] [facility];  Service: Cardiovascular;  Laterality: Left;   REMOVAL OF A DIALYSIS CATHETER Right 01/19/2019   Procedure: REMOVAL OF A DIALYSIS CATHETER;  Surgeon: Katha Cabal, MD;  Location: [facility];  Service: Vascular;  Laterality: Right;   RENAL SHUNTS     UPPER EXTREMITY VENOGRAPHY Right 03/20/2020   Procedure: UPPER EXTREMITY VENOGRAPHY;  Surgeon: Algernon Huxley, MD;  Location: [place] [facility];  Service: Cardiovascular;  Laterality: Right;   VASCULAR ACCESS DEVICE INSERTION Right 11/24/2020   Procedure: INSERTION OF HERO VASCULAR ACCESS DEVICE (GRAFT);  Surgeon: Katha Cabal, MD;  Location: [facility];  Service: Vascular;  Laterality: Right;    Prior to Admission medications   Medication Sig Start Date End Date Taking? Authorizing Provider  acetaminophen (TYLENOL) 500 MG tablet Take 500 mg by mouth every 6 (six) hours as needed for moderate pain or headache.    [provider]  aspirin 81 MG tablet Take 81 mg by mouth daily.    [provider]  cephALEXin (KEFLEX) 500 MG capsule Take 500 mg by mouth 2 (two) times daily. 10/30/20   [provider]  cetirizine (ZYRTEC) 10 MG tablet Take 10 mg by mouth daily.    [provider]  cinacalcet (SENSIPAR) 30 MG tablet Take 30 mg by mouth Every Tuesday,Thursday,and Saturday with dialysis.    [provider]  docusate sodium  (COLACE) 100 MG capsule Take 100 mg by mouth daily.    [provider]  doxycycline (VIBRA-TABS) 100 MG tablet Take 100 mg by mouth 2 (two) times daily. 11/21/20   [provider]  HYDROcodone-acetaminophen (NORCO) 5-325 MG tablet Take 1-2 tablets by mouth every 6 (six) hours as needed for moderate pain or severe pain. 11/24/20   Schnier, Dolores Lory, MD  latanoprost (XALATAN) 0.005 % ophthalmic solution Place 1 drop into both eyes at bedtime. 07/28/15   [provider]  pantoprazole (PROTONIX) 40 MG tablet Take 40 mg by mouth daily. 05/22/20   [provider]  pravastatin (PRAVACHOL) 20 MG tablet Take 20 mg by mouth daily.    [provider]  sevelamer carbonate (RENVELA) 800 MG tablet Take 1,600 mg by mouth 3 (three) times daily with meals. 07/13/13   [provider]  Timolol Maleate 0.5 % (DAILY) SOLN Place 1 drop into both  eyes every morning. 04/12/19   [provider]    Allergies Penicillins, Codeine, Codeine sulfate, Contrast media [iodinated diagnostic agents], Gadobutrol, Gadolinium derivatives, Metrizamide, and Statins  Family History  Problem Relation Age of Onset   Hypertension Sister     Social History Social History   Tobacco Use   Smoking status: Never   Smokeless tobacco: Never  Vaping Use   Vaping Use: Never used  Substance Use Topics   Alcohol use: No   Drug use: No    Review of Systems Constitutional: No fever/chills.  General malaise and weakness. Eyes: No visual changes. ENT: No sore throat. Cardiovascular: Denies chest pain.  Positive for dizziness when she gets up and moves around. Respiratory: Denies shortness of breath. Gastrointestinal: No abdominal pain.  No nausea, no vomiting.  No diarrhea.  No constipation. Genitourinary: Negative for dysuria. Musculoskeletal: Negative for neck pain.  Negative for back pain. Integumentary: Negative for rash. Neurological: Negative for headaches, focal  weakness or numbness.   ____________________________________________   PHYSICAL EXAM:  VITAL SIGNS: ED Triage Vitals  Enc Vitals Group     BP 11/30/20 0020 (!) 122/40     Pulse Rate 11/30/20 0020 85     Resp 11/30/20 0020 18     Temp 11/30/20 0020 98.5 F (36.9 C)     Temp Source 11/30/20 0020 Oral     SpO2 11/30/20 0020 100 %     Weight 11/30/20 0028 73 kg (161 lb)     Height 11/30/20 0028 1.499 m (4\' 11" )     Head Circumference --      Peak Flow --      Pain Score 11/30/20 0028 0     Pain Loc --      Pain Edu? --      Excl. in [place]? --     Constitutional: Alert and oriented.  Eyes: Conjunctivae are normal.  Head: Atraumatic. Nose: No congestion/rhinnorhea. Mouth/Throat: Patient is wearing a mask. Neck: No stridor.  No meningeal signs.   Cardiovascular: Normal rate, regular rhythm. Good peripheral circulation.  New vascular access site in the left arm. Respiratory: Normal respiratory effort.  No retractions. Gastrointestinal: Soft and nontender. No distention.  Digital rectal exam notable for light yellow stool, Hemoccult negative, quality control passed, ED nurse present throughout exam as chaperone. Musculoskeletal: No lower extremity tenderness nor edema. No gross deformities of extremities except for the current vascular access in the left arm and the previous access in the right arm. Neurologic:  Normal speech and language. No gross focal neurologic deficits are appreciated.  Skin:  Skin is warm, dry and intact. Psychiatric: Mood and affect are normal. Speech and behavior are normal.  ____________________________________________   LABS (all labs ordered are listed, but only abnormal results are displayed)  Labs Reviewed  CBC - Abnormal; Notable for the following components:      Result Value   RBC 2.80 (*)    Hemoglobin 7.7 (*)    HCT 24.2 (*)    RDW 16.3 (*)    All other components within normal limits  BASIC METABOLIC PANEL - Abnormal; Notable for the  following components:   Potassium 3.4 (*)    Chloride 92 (*)    Glucose, Bld 131 (*)    BUN 26 (*)    Creatinine, Ser 5.65 (*)    Calcium 8.0 (*)    GFR, Estimated 7 (*)    Anion gap 16 (*)    All other components within normal limits  TROPONIN I (HIGH SENSITIVITY) - Abnormal; Notable for the following components:   Troponin I (High Sensitivity) 18 (*)    All other components within normal limits  RESP PANEL BY RT-PCR (FLU A&B, COVID) ARPGX2  PROTIME-INR  TYPE AND SCREEN  PREPARE RBC (CROSSMATCH)  TROPONIN I (HIGH SENSITIVITY)   ____________________________________________  EKG  ED ECG REPORT I, Hinda Kehr, the attending physician, personally viewed and interpreted this ECG.  Date: 11/30/2020 EKG Time: 00: 37 Rate: 84 Rhythm: Normal sinus rhythm QRS Axis: normal Intervals: Right bundle branch block ST/T Wave abnormalities: Non-specific ST segment / T-wave changes, but no clear evidence of acute ischemia. Narrative Interpretation: no definitive evidence of acute ischemia; does not meet STEMI criteria.  ____________________________________________   PROCEDURES   Procedure(s) performed (including Critical Care):  .1-3 Lead EKG Interpretation  Date/Time: 11/30/2020 6:50 AM Performed by: Hinda Kehr, MD Authorized by: Hinda Kehr, MD     Interpretation: normal     ECG rate:  80   ECG rate assessment: normal     Rhythm: sinus rhythm     Ectopy: none     Conduction: normal   .Critical Care  Date/Time: 11/30/2020 6:50 AM Performed by: Hinda Kehr, MD Authorized by: Hinda Kehr, MD   Critical care provider statement:    Critical care time (minutes):  30   Critical care time was exclusive of:  Separately billable procedures and treating other patients   Critical care was necessary to treat or prevent imminent or life-threatening deterioration of the following conditions:  Circulatory failure (symptomatic anemia requiring blood transfusion)   Critical  care was time spent personally by me on the following activities:  Development of treatment plan with patient or surrogate, discussions with consultants, evaluation of patient's response to treatment, examination of patient, obtaining history from patient or surrogate, ordering and performing treatments and interventions, ordering and review of laboratory studies, ordering and review of radiographic studies, pulse oximetry, re-evaluation of patient's condition and review of old charts   ____________________________________________   [place] / MDM / [place] / ED COURSE  As part of my medical decision making, I reviewed the following data within the [place] notes reviewed and incorporated, Labs reviewed , EKG interpreted , Old chart reviewed, Discussed with admitting physician , and Notes from prior ED visits   Differential diagnosis includes, but is not limited to, symptomatic anemia, metabolic or electrolyte abnormality, ACS, PE.  The patient is on the cardiac monitor to evaluate for evidence of arrhythmia and/or significant heart rate changes.  Patient's vital signs are stable and within normal limits and she is in no respiratory distress although this is her day for dialysis.  Basic metabolic panel is consistent with end-stage renal disease on hemodialysis but otherwise reassuring.  CBC is notable for a hemoglobin of 7.7 which is lower than it was on her last visit.  Her rectal exam was negative for occult blood.  Unclear why the patient is newly anemic but she seems to be suffering from symptomatic anemia which is likely the result of her malaise, fatigue, and near syncope as well as some hypertension earlier (orthostatic).  I had my usual and customary discussion of risks and benefits of blood transfusion and she agreed.  I called and spoke by phone with Dr. Holley Raring with nephrology to to ask about the blood transfusion and dialysis this morning.   He recommended 1 unit transfused over 4 hours and they will consult later today to determine  appointment and she may need dialysis since this is her day.  No evidence of acute infection.  Blood transfusion ordered.  I discussed the case in person with Dr. Damita Dunnings with the hospitalist service and she will admit.         ____________________________________________  FINAL CLINICAL IMPRESSION(S) / ED DIAGNOSES  Final diagnoses:  Symptomatic anemia  Near syncope  ESRD on hemodialysis ([place])     MEDICATIONS GIVEN DURING THIS VISIT:  Medications  0.9 %  sodium chloride infusion (has no administration in time range)     ED Discharge Orders     None        Note:  This document was prepared using Dragon voice recognition software and may include unintentional dictation errors.   Hinda Kehr, MD 11/30/20 [phone]

## 2020-11-30 NOTE — Progress Notes (Addendum)
Pt admitted to 1c room 127 @ 1600   Post dialysis BS 105   Pt. Wishes to eat dinner then start RBC transfusion

## 2020-11-30 NOTE — Progress Notes (Signed)
OT Cancellation Note  Patient Details Name: LADASHA SCHNACKENBERG MRN: [id number] DOB: 1946/04/07   Cancelled Treatment:    Reason Eval/Treat Not Completed: Patient at procedure or test/ unavailable. Order received and chart reviewed. Pt noted to be off the floor for HD at this time. Will follow up at later date/time as able to initiate services.   Dessie Coma, M.S. OTR/L  11/30/20, 12:23 PM  ascom [phone]

## 2020-11-30 NOTE — ED Notes (Signed)
Pt to dialysis at this time

## 2020-11-30 NOTE — Progress Notes (Signed)
This patient was ordered to be dialyzed for 3hrs to a net Uf between 22mL and 583mL Patient arrived with soft BP in the low 90's, but asymptomatic, parameters were given by Nephrology Np to maintain a MAP of 60 as long as pt remains asymptomatic. During HD, pt became increasingly hypotensive and then c/o symptoms, Uf was turned off and fluids were given for symptoms. Sherlyn Hay Np was in the suite and made aware. Pt responded well to the intervention. Uf remained off for remaining HD time due to asymptomatic hypotension. Post HD, vital signs are stable and patient denies comaplaints

## 2020-11-30 NOTE — Progress Notes (Signed)
K.Stegmayer PA-C with vascular DR.Schnier consulted via secure text related to pt with recent [place] (GRAFT) on 11/24/20 pt. Right arm significantly swollen, will elevate arm to aid in swelling and Stegmayer PA-C will see patient on 07/29 for further evaluation.

## 2020-12-01 DIAGNOSIS — D649 Anemia, unspecified: Secondary | ICD-10-CM | POA: Diagnosis not present

## 2020-12-01 LAB — BASIC METABOLIC PANEL
Anion gap: 13 (ref 5–15)
BUN: 16 mg/dL (ref 8–23)
CO2: 29 mmol/L (ref 22–32)
Calcium: 8.2 mg/dL — ABNORMAL LOW (ref 8.9–10.3)
Chloride: 96 mmol/L — ABNORMAL LOW (ref 98–111)
Creatinine, Ser: 4.36 mg/dL — ABNORMAL HIGH (ref 0.44–1.00)
GFR, Estimated: 10 mL/min — ABNORMAL LOW (ref >60)
Glucose, Bld: 91 mg/dL (ref 70–99)
Potassium: 3.6 mmol/L (ref 3.5–5.1)
Sodium: 138 mmol/L (ref 135–145)

## 2020-12-01 LAB — CBC
HCT: 28.9 % — ABNORMAL LOW (ref 36.0–46.0)
Hemoglobin: 9.5 g/dL — ABNORMAL LOW (ref 12.0–15.0)
MCH: 27.9 pg (ref 26.0–34.0)
MCHC: 32.9 g/dL (ref 30.0–36.0)
MCV: 84.8 fL (ref 80.0–100.0)
Platelets: 217 10*3/uL (ref 150–400)
RBC: 3.41 MIL/uL — ABNORMAL LOW (ref 3.87–5.11)
RDW: 16.7 % — ABNORMAL HIGH (ref 11.5–15.5)
WBC: 8.4 10*3/uL (ref 4.0–10.5)
nRBC: 0 % (ref 0.0–0.2)

## 2020-12-01 LAB — GLUCOSE, CAPILLARY
Glucose-Capillary: 85 mg/dL (ref 70–99)
Glucose-Capillary: 154 mg/dL — ABNORMAL HIGH (ref 70–99)

## 2020-12-01 LAB — PHOSPHORUS: Phosphorus: 3.3 mg/dL (ref 2.5–4.6)

## 2020-12-01 LAB — VITAMIN B12: Vitamin B-12: 459 pg/mL (ref 180–914)

## 2020-12-01 MED ORDER — MIDODRINE HCL 5 MG PO TABS: 5.0000 mg | ORAL_TABLET | Freq: Once | ORAL | Status: DC

## 2020-12-01 NOTE — Progress Notes (Signed)
Priscella Mann, MD notified of VS changes   Will continue to monitor    12/01/20 0916 12/01/20 0920  Vitals  BP (!) 127/54 (!) 127/53  MAP (mmHg) 74 72  BP Location Left Arm Left Arm  BP Method Automatic Automatic  Patient Position (if appropriate) Sitting Standing  Pulse Rate 85 (!) 48  MEWS COLOR  MEWS Score Color Green Green  MEWS Score  MEWS Temp 0 0  MEWS Systolic 0 0  MEWS Pulse 0 1  MEWS RR 0 0  MEWS LOC 0 0  MEWS Score 0 1

## 2020-12-01 NOTE — Progress Notes (Addendum)
[facility]  ROUNDING NOTE   Subjective:   [name] is a 75 y.o.  female with HLD, hypertension, diabetes, pulmonary hypertension, dCHF anemia and ESRD on dialysis, who was admitted to [facility] on 11/30/2020 for Near syncope [R55] ESRD on hemodialysis ([place]) [N18.6, Z99.2] Symptomatic anemia [D64.9]   Patient seen resting in bed  States she feels well, weak but improved Tolerating meals  Objective:  Vital signs in last 24 hours:  Temp:  [97.8 F (36.6 C)-99.1 F (37.3 C)] 98.1 F (36.7 C) (07/29 0755) Pulse Rate:  [48-88] 48 (07/29 0920) Resp:  [14-20] 17 (07/29 0755) BP: (80-129)/(39-92) 127/53 (07/29 0920) SpO2:  [95 %-100 %] 100 % (07/29 0755)  Weight change:  Filed Weights   11/30/20 0028  Weight: 73 kg    Intake/Output: I/O last 3 completed shifts: In: 340 [Blood:340] Out: -583    Intake/Output this shift:  No intake/output data recorded.  Physical Exam: General: NAD, laying in bed  Head: Normocephalic, atraumatic. Moist oral mucosal membranes  Eyes: Anicteric  Lungs:  Clear to auscultation, normal effort  Heart: Regular rate and rhythm  Abdomen:  Soft, nontender  Extremities:  1+ peripheral edema.  Neurologic: Nonfocal, moving all four extremities  Skin: No lesions  Access: Rt Permcath    Basic Metabolic Panel: Recent Labs  Lab 11/25/20 0929 11/30/20 0035 12/01/20 0635  NA 136 135 138  K 3.7 3.4* 3.6  CL 97* 92* 96*  CO2 27 27 29   GLUCOSE 126* 131* 91  BUN 46* 26* 16  CREATININE 7.38* 5.65* 4.36*  CALCIUM 8.0* 8.0* 8.2*    Liver Function Tests: Recent Labs  Lab 11/25/20 0929  AST 20  ALT 10  ALKPHOS 55  BILITOT 0.9  PROT 7.1  ALBUMIN 3.8   No results for input(s): LIPASE, AMYLASE in the last 168 hours. No results for input(s): AMMONIA in the last 168 hours.  CBC: Recent Labs  Lab 11/25/20 0929 11/30/20 0035 12/01/20 0635  WBC 13.0* 10.1 8.4  HGB 8.2* 7.7* 9.5*  HCT 25.7* 24.2* 28.9*  MCV 86.5 86.4 84.8   PLT 188 241 217    Cardiac Enzymes: No results for input(s): CKTOTAL, CKMB, CKMBINDEX, TROPONINI in the last 168 hours.  BNP: Invalid input(s): POCBNP  CBG: Recent Labs  Lab 11/24/20 1134 11/30/20 1642 12/01/20 0749  GLUCAP 143* 105* 85    Microbiology: Results for orders placed or performed during the hospital encounter of 11/30/20  Resp Panel by RT-PCR (Flu A&B, Covid) Nasopharyngeal Swab     Status: None   Collection Time: 11/30/20  5:23 AM   Specimen: Nasopharyngeal Swab; Nasopharyngeal(NP) swabs in vial transport medium  Result Value Ref Range Status   SARS Coronavirus 2 by RT PCR NEGATIVE NEGATIVE Final    Comment: (NOTE) SARS-CoV-2 target nucleic acids are NOT DETECTED.  The SARS-CoV-2 RNA is generally detectable in upper respiratory specimens during the acute phase of infection. The lowest concentration of SARS-CoV-2 viral copies this assay can detect is 138 copies/mL. A negative result does not preclude SARS-Cov-2 infection and should not be used as the sole basis for treatment or other patient management decisions. A negative result may occur with  improper specimen collection/handling, submission of specimen other than nasopharyngeal swab, presence of viral mutation(s) within the areas targeted by this assay, and inadequate number of viral copies(<138 copies/mL). A negative result must be combined with clinical observations, patient history, and epidemiological information. The expected result is Negative.  Fact Sheet for  Patients:  EntrepreneurPulse.com.au  Fact Sheet for Healthcare Providers:  IncredibleEmployment.be  This test is no t yet approved or cleared by the [place] FDA and  has been authorized for detection and/or diagnosis of SARS-CoV-2 by FDA under an Emergency Use Authorization (EUA). This EUA will remain  in effect (meaning this test can be used) for the duration of the COVID-19 declaration under  Section 564(b)(1) of the Act, 21 U.S.C.section 360bbb-3(b)(1), unless the authorization is terminated  or revoked sooner.       Influenza A by PCR NEGATIVE NEGATIVE Final   Influenza B by PCR NEGATIVE NEGATIVE Final    Comment: (NOTE) The Xpert Xpress SARS-CoV-2/FLU/RSV plus assay is intended as an aid in the diagnosis of influenza from Nasopharyngeal swab specimens and should not be used as a sole basis for treatment. Nasal washings and aspirates are unacceptable for Xpert Xpress SARS-CoV-2/FLU/RSV testing.  Fact Sheet for Patients: EntrepreneurPulse.com.au  Fact Sheet for Healthcare Providers: IncredibleEmployment.be  This test is not yet approved or cleared by the [place] FDA and has been authorized for detection and/or diagnosis of SARS-CoV-2 by FDA under an Emergency Use Authorization (EUA). This EUA will remain in effect (meaning this test can be used) for the duration of the COVID-19 declaration under Section 564(b)(1) of the Act, 21 U.S.C. section 360bbb-3(b)(1), unless the authorization is terminated or revoked.  Performed at [facility], [address] [place], [place] [address]     Coagulation Studies: Recent Labs    11/30/20 0523  LABPROT 14.0  INR 1.1    Urinalysis: No results for input(s): COLORURINE, LABSPEC, PHURINE, GLUCOSEU, HGBUR, BILIRUBINUR, KETONESUR, PROTEINUR, UROBILINOGEN, NITRITE, LEUKOCYTESUR in the last 72 hours.  Invalid input(s): APPERANCEUR    Imaging: No results found.   Medications:     aspirin EC  81 mg Oral Daily   cephALEXin  500 mg Oral BID   Chlorhexidine Gluconate Cloth  6 each Topical Q0600   [START ON 12/02/2020] cinacalcet  30 mg Oral Q T,Th,Sa-HD   docusate sodium  100 mg Oral Daily   latanoprost  1 drop Both Eyes QHS   pantoprazole  40 mg Oral Daily   pravastatin  20 mg Oral Daily   sevelamer carbonate  1,600 mg Oral TID WC   timolol  1 drop Both Eyes q  morning   acetaminophen, alteplase, heparin, HYDROcodone-acetaminophen, hydrOXYzine, lidocaine (PF), lidocaine-prilocaine, loratadine, pentafluoroprop-tetrafluoroeth  Assessment/ Plan:  [name] is a 75 y.o.  female  with HLD, hypertension, diabetes, pulmonary hypertension, dCHF anemia and ESRD on dialysis, who was admitted to [facility] on 11/30/2020 for Near syncope [R55] ESRD on hemodialysis ([place]) [N18.6, Z99.2] Symptomatic anemia [D64.9]   [facility] [address]/TTS/ Rt Permcath  End Stage Renal Disease on dialysis Will maintain outpatient schedule, if possible Received dialysis yesterday, due to hypotension were not able to remove desired UF. No acute need for dialysis. Next treatment scheduled for Saturday. Midodrine 5mg  ordered prior to dialysis.  2. Anemia of chronic kidney disease  Lab Results  Component Value Date   HGB 9.5 (L) 12/01/2020   Received 1 unit of RBC's yesterday  Hgb improved today  Will continue to monitor  3. Secondary Hyperparathyroidism:  Lab Results  Component Value Date   CALCIUM 8.2 (L) 12/01/2020   CAION 1.06 (L) 11/24/2020    Calcium low, phosphorus 3.3 at goal     LOS: 0 Shandrika Ambers 7/29/202210:25 AM

## 2020-12-01 NOTE — Evaluation (Signed)
Occupational Therapy Evaluation Patient Details Name: Kelli Nguyen MRN: [id number] DOB: 11/25/1945 Today's Date: 12/01/2020    History of Present Illness 75 y.o. female who presents with weakness and dizziness; pt diagnosed with symptomatic anemia. Medical history significant of ESRD-HD (TTS), hypertension, hyperlipidemia, diabetes mellitus, GERD, right bundle blockage, pulmonary hypertension, dCHF, PVD, CAD, anemia.   Clinical Impression   Kelli Nguyen presents today with increased weakness, limited endurance, impaired balance, and reduced ability to engage in fxl mobility tasks. Pt reports sharing an apt with her son, with daughter visiting daily, with the two children able to provide any asst pt needs. The children regularly asst with transportation and shopping, pt is usually able to meet the remainder of her basic needs. Pt reports that she has felt increasingly weak in past month; she denies pain, describing the swelling in her UE as "uncomfortable." Denies any falls in previous 6 months. Uses RW for ambulation. Today Kelli Nguyen is able to perform bed mobility with SUPV, transfers w/ CGA, UB dressing with SUPV, LB dressing with Min A, grooming with Mod I. She does require rest breaks between activities. Kelli Nguyen reports dizziness with sit<stand, saying she feels "like there is water swishing around in my head." Pt is eager to improve her strength, endurance, and balance. Recommend OT while pt is hospitalized, and regular outpatient OT post DC to address recent decline in fxl mobility.     Follow Up Recommendations  Outpatient OT    Equipment Recommendations  None recommended by OT    Recommendations for Other Services       Precautions / Restrictions Precautions Precautions: None Precaution Comments: be aware of orthostatics and HR Restrictions Weight Bearing Restrictions: No      Mobility Bed Mobility Overal bed mobility: Needs Assistance Bed Mobility: Sit to Supine      Sit to supine: Supervision   General bed mobility comments: extra time/effort    Transfers Overall transfer level: Needs assistance Equipment used: Rolling walker (2 wheeled) Transfers: Sit to/from Stand Sit to Stand: Min guard         General transfer comment: x 3 reps. CGA for safety, pt endorses dizziness, headache with sit<stand    Balance Overall balance assessment: Needs assistance Sitting-balance support: No upper extremity supported;Feet supported Sitting balance-Leahy Scale: Good     Standing balance support: Bilateral upper extremity supported Standing balance-Leahy Scale: Good Standing balance comment: Dizziness with standing                           ADL either performed or assessed with clinical judgement   ADL Overall ADL's : Needs assistance/impaired Eating/Feeding: Independent   Grooming: Wash/dry hands;Wash/dry face;Applying deodorant;Modified independent           Upper Body Dressing : Supervision/safety   Lower Body Dressing: Minimal assistance Lower Body Dressing Details (indicate cue type and reason): donning socks                     Vision Patient Visual Report: No change from baseline       Perception     Praxis      Pertinent Vitals/Pain Pain Assessment: No/denies pain     Hand Dominance Right   Extremity/Trunk Assessment Upper Extremity Assessment Upper Extremity Assessment: Generalized weakness (edema, b/l UE, more significant on R)   Lower Extremity Assessment Lower Extremity Assessment: Overall WFL for tasks assessed   Cervical / Trunk Assessment Cervical / Trunk Assessment:  Kyphotic   Communication Communication Communication: No difficulties   Cognition Arousal/Alertness: Awake/alert Behavior During Therapy: WFL for tasks assessed/performed Overall Cognitive Status: Within Functional Limits for tasks assessed                                 General Comments: pleasant, A&Ox4    General Comments  b/l UE edema    Exercises Other Exercises Other Exercises: Mobility, transfers, dressing, grooming. Educ re: role of OT, POC, HH vs OP rehab therapy, falls prevention, safety, ECS   Shoulder Instructions      Home Living Family/patient expects to be discharged to:: Private residence Living Arrangements: Children (lives with son, daughter comes by daily) Available Help at Discharge: Family;Available 24 hours/day Type of Home: Apartment Home Access: Stairs to enter Entrance Stairs-Number of Steps: 2 Entrance Stairs-Rails: Right;Left;Can reach both Home Layout: One level     Bathroom Shower/Tub: Teacher, early years/pre: Standard Bathroom Accessibility: No   Home Equipment: Bedside commode;Walker - 4 wheels;Shower seat;Cane - single point   Additional Comments: is able to borrow a WC when needed      Prior Functioning/Environment Level of Independence: Independent with assistive device(s)        Comments: Lives with son however pt is independent in all ADLs and IADLs. Her daughter takes her to appointments. She states her son is available at all times to assist if necessary. No falls in the past 6 months.        OT Problem List: Decreased strength;Decreased activity tolerance;Impaired balance (sitting and/or standing);Impaired UE functional use;Obesity;Increased edema      OT Treatment/Interventions: Self-care/ADL training;Patient/family education;Therapeutic exercise;Balance training;Energy conservation;Therapeutic activities    OT Goals(Current goals can be found in the care plan section) Acute Rehab OT Goals Patient Stated Goal: to get home and feel better OT Goal Formulation: With patient Time For Goal Achievement: 12/15/20 Potential to Achieve Goals: Good ADL Goals Pt Will Perform Lower Body Dressing: with modified independence Pt Will Transfer to Toilet: with modified independence (using LRAD) Pt/caregiver will Perform Home Exercise  Program: Increased ROM;Increased strength;Independently (to increase strength, endurance, flexibility, and balance) Additional ADL Goal #1: Pt will be able to identify/demonstrate 2+ energy conservation strategies  OT Frequency: Min 1X/week   Barriers to D/C:            Co-evaluation              AM-PAC OT "6 Clicks" Daily Activity     Outcome Measure Help from another person eating meals?: None Help from another person taking care of personal grooming?: None Help from another person toileting, which includes using toliet, bedpan, or urinal?: A Little Help from another person bathing (including washing, rinsing, drying)?: A Little Help from another person to put on and taking off regular upper body clothing?: None Help from another person to put on and taking off regular lower body clothing?: A Little 6 Click Score: 21   End of Session Equipment Utilized During Treatment: Rolling walker  Activity Tolerance: Patient tolerated treatment well Patient left: in bed;with call bell/phone within reach;with bed alarm set  OT Visit Diagnosis: Unsteadiness on feet (R26.81);Muscle weakness (generalized) (M62.81);Dizziness and giddiness (R42)                Time: 5093-2671 OT Time Calculation (min): 25 min Charges:  OT General Charges $OT Visit: 1 Visit OT Evaluation $OT Eval Low Complexity: 1 Low OT Treatments $  Self Care/Home Management : 23-37 mins Josiah Lobo, PhD, MS, OTR/L 12/01/20, 1:50 PM

## 2020-12-01 NOTE — Plan of Care (Signed)
1 unit of PRBC infused this shift without incident. Improvement in BP, other VSS. Pt denies pain or n/v. Remained free from falls or injury. Bed low and in locked position with call bell within reach.   Problem: Clinical Measurements: Goal: Cardiovascular complication will be avoided 11/30/2020 2359 by Wende Crease, RN Outcome: Progressing 11/30/2020 2014 by Wende Crease, RN Outcome: Progressing   Problem: Clinical Measurements: Goal: Diagnostic test results will improve 11/30/2020 2359 by Wende Crease, RN Outcome: Progressing 11/30/2020 2014 by Wende Crease, RN Outcome: Progressing   Problem: Activity: Goal: Risk for activity intolerance will decrease 11/30/2020 2359 by Wende Crease, RN Outcome: Progressing 11/30/2020 2014 by Wende Crease, RN Outcome: Progressing   Problem: Cardiac: Goal: Will achieve and/or maintain adequate cardiac output 11/30/2020 2359 by Wende Crease, RN Outcome: Progressing 11/30/2020 2014 by Wende Crease, RN Outcome: Progressing

## 2020-12-01 NOTE — Evaluation (Signed)
Physical Therapy Evaluation Patient Details Name: Kelli Nguyen MRN: [id number] DOB: 05-29-1945 Today's Date: 12/01/2020   History of Present Illness  75 y.o. female who presents with weakness and dizziness; pt diagnosed with symptomatic anemia. Medical history significant of ESRD-HD (TTS), hypertension, hyperlipidemia, diabetes mellitus, GERD, right bundle blockage, pulmonary hypertension, dCHF, PVD, CAD, anemia.  Clinical Impression  Pt received supine in bed, RN in room asking to boost pt in bed. PT asked if pt could sit up to EOB for session instead, pt agreeable. Orthostatic symptoms were reported upon sitting and again standing. Orthostatics were measured with RN present, RN documented. No significant change in BP however significant drop in HR when standing. First STS caused sharp pain in L hip (s/p THA 2018) - pt states this is normal. Pt presents with decreased strength in BLE and difficulty with bed mobility. Decreased functional endurance, decreased walking distance and decreased gait velocity with mild gait deficits including decreased stride length. Pt ended session sitting EOB to eat her breakfast, RN present in room. Pt considering SNF for increased rehab to return to baseline strength and activity. Pt would benefit from skilled PT to address above deficits and promote optimal return to PLOF.     Follow Up Recommendations SNF (if pt refuses, PT rec HHPT, she will have 24 hr assistance from her son)    Equipment Recommendations  None recommended by PT    Recommendations for Other Services       Precautions / Restrictions Precautions Precautions: None Precaution Comments: be aware of orthostatics and HR Restrictions Weight Bearing Restrictions: No      Mobility  Bed Mobility Overal bed mobility: Needs Assistance Bed Mobility: Supine to Sit     Supine to sit: Min assist;HOB elevated     General bed mobility comments: Assist to lift trunk, increased time. Pt did not  use bed rails.    Transfers Overall transfer level: Needs assistance Equipment used: Rolling walker (2 wheeled) Transfers: Sit to/from Stand Sit to Stand: Supervision         General transfer comment: x2 reps. Close SUP for safety as pt reported dizziness with position change.  Ambulation/Gait Ambulation/Gait assistance: Supervision Gait Distance (Feet): 20 Feet Assistive device: Rolling walker (2 wheeled) Gait Pattern/deviations: Step-through pattern;Decreased step length - right;Decreased step length - left;Decreased stride length;Trunk flexed;Narrow base of support Gait velocity: decreased   General Gait Details: Pt ambulated 55ft using RW, close SUP for safety although symptoms had cleared prior to ambulation.  Stairs            Wheelchair Mobility    Modified Rankin (Stroke Patients Only)       Balance Overall balance assessment: Needs assistance Sitting-balance support: No upper extremity supported;Feet supported Sitting balance-Leahy Scale: Normal       Standing balance-Leahy Scale: Good Standing balance comment: SUP for safety due to symptomatic position changes                             Pertinent Vitals/Pain Pain Assessment: No/denies pain    Home Living Family/patient expects to be discharged to:: Private residence Living Arrangements: Children Available Help at Discharge: Family;Available 24 hours/day Type of Home: Apartment Home Access: Stairs to enter Entrance Stairs-Rails: Right;Left;Can reach both Entrance Stairs-Number of Steps: 2 Home Layout: One level Home Equipment: Bedside commode;Walker - 4 wheels;Shower seat;Cane - single point Additional Comments: is able to borrow a WC when needed    Prior Function  Level of Independence: Independent with assistive device(s)         Comments: Lives with son however pt is independent in all ADLs and IADLs. Her daughter takes her to appointments. She states her son is available at  all times to assist if necessary. No falls in the past 6 months.     Hand Dominance   Dominant Hand: Right    Extremity/Trunk Assessment   Upper Extremity Assessment Upper Extremity Assessment: Generalized weakness    Lower Extremity Assessment Lower Extremity Assessment: Overall WFL for tasks assessed (MMT: generalized 4-4+/5 BLE)    Cervical / Trunk Assessment Cervical / Trunk Assessment: Kyphotic  Communication   Communication: No difficulties  Cognition Arousal/Alertness: Awake/alert Behavior During Therapy: WFL for tasks assessed/performed Overall Cognitive Status: Within Functional Limits for tasks assessed                                 General Comments: A&Ox4      General Comments      Exercises Other Exercises Other Exercises: Pt educated on PT role, POC, orthostatics, safety with mobility. Orthostatics were measured with RN present, RN documented. No significant change in BP however significant drop in HR when standing. First STS caused sharp pain in L hip (s/p THA 2018) - pt states this is normal. Seated rest break taken before second stand and ambulation.   Assessment/Plan    PT Assessment Patient needs continued PT services  PT Problem List Decreased strength;Decreased mobility;Decreased activity tolerance;Decreased balance       PT Treatment Interventions Therapeutic activities;DME instruction;Gait training;Therapeutic exercise;Patient/family education;Stair training;Balance training;Functional mobility training;Neuromuscular re-education    PT Goals (Current goals can be found in the Care Plan section)  Acute Rehab PT Goals Patient Stated Goal: to return to prior functional status before she "got sick" PT Goal Formulation: With patient Time For Goal Achievement: 12/15/20 Potential to Achieve Goals: Fair    Frequency Min 2X/week   Barriers to discharge   N/A    Co-evaluation               AM-PAC PT "6 Clicks" Mobility   Outcome Measure Help needed turning from your back to your side while in a flat bed without using bedrails?: A Little Help needed moving from lying on your back to sitting on the side of a flat bed without using bedrails?: A Little Help needed moving to and from a bed to a chair (including a wheelchair)?: A Little Help needed standing up from a chair using your arms (e.g., wheelchair or bedside chair)?: A Little Help needed to walk in hospital room?: A Little Help needed climbing 3-5 steps with a railing? : A Little 6 Click Score: 18    End of Session Equipment Utilized During Treatment: Gait belt Activity Tolerance: Patient tolerated treatment well;Patient limited by fatigue Patient left: in bed;with nursing/sitter in room;with call bell/phone within reach Nurse Communication: Mobility status;Precautions PT Visit Diagnosis: Unsteadiness on feet (R26.81);Other abnormalities of gait and mobility (R26.89);Muscle weakness (generalized) (M62.81);Dizziness and giddiness (R42)    Time: 0973-5329 PT Time Calculation (min) (ACUTE ONLY): 18 min   Charges:   PT Evaluation $PT Eval Moderate Complexity: 1 Mod PT Treatments $Therapeutic Activity: 8-22 mins        Patrina Levering PT, DPT 12/01/20 10:38 AM [phone]   [facility] 12/01/2020, 10:38 AM

## 2020-12-01 NOTE — Progress Notes (Signed)
Hemodialysis patient known at [place] ([address]) TTS 6:40am. Patient uses [facility] transportation and stated no dialysis concerns. Please contact me with any dialysis placement concerns.  Elvera Bicker Dialysis Coordinator [phone]

## 2020-12-02 DIAGNOSIS — D649 Anemia, unspecified: Secondary | ICD-10-CM | POA: Diagnosis not present

## 2020-12-02 LAB — CBC WITH DIFFERENTIAL/PLATELET
Abs Immature Granulocytes: 0.07 10*3/uL (ref 0.00–0.07)
Basophils Absolute: 0 10*3/uL (ref 0.0–0.1)
Basophils Relative: 0 %
Eosinophils Absolute: 0.4 10*3/uL (ref 0.0–0.5)
Eosinophils Relative: 4 %
HCT: 27 % — ABNORMAL LOW (ref 36.0–46.0)
Hemoglobin: 8.7 g/dL — ABNORMAL LOW (ref 12.0–15.0)
Immature Granulocytes: 1 %
Lymphocytes Relative: 12 %
Lymphs Abs: 1.1 10*3/uL (ref 0.7–4.0)
MCH: 27.7 pg (ref 26.0–34.0)
MCHC: 32.2 g/dL (ref 30.0–36.0)
MCV: 86 fL (ref 80.0–100.0)
Monocytes Absolute: 1 10*3/uL (ref 0.1–1.0)
Monocytes Relative: 11 %
Neutro Abs: 6.9 10*3/uL (ref 1.7–7.7)
Neutrophils Relative %: 72 %
Platelets: 203 10*3/uL (ref 150–400)
RBC: 3.14 MIL/uL — ABNORMAL LOW (ref 3.87–5.11)
RDW: 16.8 % — ABNORMAL HIGH (ref 11.5–15.5)
WBC: 9.6 10*3/uL (ref 4.0–10.5)
nRBC: 0 % (ref 0.0–0.2)

## 2020-12-02 LAB — BASIC METABOLIC PANEL
Anion gap: 15 (ref 5–15)
BUN: 31 mg/dL — ABNORMAL HIGH (ref 8–23)
CO2: 25 mmol/L (ref 22–32)
Calcium: 8.3 mg/dL — ABNORMAL LOW (ref 8.9–10.3)
Chloride: 96 mmol/L — ABNORMAL LOW (ref 98–111)
Creatinine, Ser: 6.81 mg/dL — ABNORMAL HIGH (ref 0.44–1.00)
GFR, Estimated: 6 mL/min — ABNORMAL LOW (ref >60)
Glucose, Bld: 108 mg/dL — ABNORMAL HIGH (ref 70–99)
Potassium: 4 mmol/L (ref 3.5–5.1)
Sodium: 136 mmol/L (ref 135–145)

## 2020-12-02 MED ORDER — EPOETIN ALFA 10000 UNIT/ML IJ SOLN
6000.0000 [IU] | INTRAMUSCULAR | Status: DC
Start: 2020-12-02 — End: 2020-12-02
  Administered 2020-12-02: 6000 [IU] via INTRAVENOUS
  Filled 2020-12-02: qty 1

## 2020-12-02 MED ORDER — MIDODRINE HCL 5 MG PO TABS: 5.0000 mg | ORAL_TABLET | ORAL | Status: AC

## 2020-12-02 MED ORDER — FLUTICASONE PROPIONATE 50 MCG/ACT NA SUSP
2.0000 | Freq: Every day | NASAL | Status: DC
  Administered 2020-12-02: 10:00:00 2 via NASAL
  Filled 2020-12-02: qty 16

## 2020-12-02 MED ORDER — MIDODRINE HCL 5 MG PO TABS
5.0000 mg | ORAL_TABLET | ORAL | Status: DC
  Administered 2020-12-02: 5 mg via ORAL
  Filled 2020-12-02: qty 1

## 2020-12-02 NOTE — Discharge Summary (Signed)
Physician Discharge Summary  Kelli Nguyen ZLD:[id number] DOB: 13-Apr-1946 DOA: 11/30/2020  PCP: Crissie Figures, PA-C  Admit date: 11/30/2020 Discharge date: 12/02/2020  Admitted From: Home Disposition: Home with home health  Recommendations for Outpatient Follow-up:  Follow up with PCP in 1-2 weeks Follow-up with nephrology as directed  Home Health: Yes, home health PT and OT Equipment/Devices: None  Discharge Condition: Stable CODE STATUS: Full Diet recommendation: Renal  Brief/Interim Summary: 75 y.o. female with medical history significant of ESRD-HD (TTS), hypertension, hyperlipidemia, diabetes mellitus, GERD, right bundle blockage, pulmonary hypertension, dCHF, PVD, CAD, anemia, who presents with weakness and dizziness.   Patient states that in the past 5 days, she has been having generalized weakness, dizziness, lightheadedness.  She feels like she is going to pass out but did not.  No unilateral numbness or tinglings in extremities, no facial droop or slurred speech.  Denies chest pain and shortness of breath.  She has mild dry cough. No fever or chills.  She does not have nausea, vomiting, diarrhea or abdominal pain. Patient denies dark stool or rectal bleeding.  She states that her blood pressure has been low.   Of note, pt recently had vascular surgery for HERO vascular graft placement to right arm on 11/24/20. Before the procedure, she had dizziness and generalized weakness. She was evaluated in ED and found that her hemoglobin was lower than it was a few weeks ago before the surgery.  The ED physician spoke with the vascular surgeon who said she should be fine. Pt states that she is taking antibiotics chronically for infection of prosthetic left hip joint. She is following up with infectious disease in [facility].  On day of discharge patient underwent hemodialysis successfully.  0.5 L fluid removed.  Hemoglobin stable.  Patient decided on discharge home with home health versus skilled  nursing facility.   Discharge Diagnoses:  Principal Problem:   Symptomatic anemia Active Problems:   End stage renal failure on dialysis (HCC)   Hyperlipidemia   CAD (coronary artery disease)   Diabetes mellitus with kidney complication (HCC)   Anemia in ESRD (end-stage renal disease) (HCC)   HTN (hypertension)   Chronic diastolic CHF (congestive heart failure) (HCC)   Hypokalemia   Elevated troponin   Prosthetic joint infection of left hip (HCC)  Symptomatic anemia in the setting of ESRD Patient presented lightheaded and dizziness.  Likely due to symptomatic anemia.  Hemoglobin 7.7 from baseline 10.5.  No evidence of blood loss.  Transfuse 1 unit PRBC with good result.  Was seen by therapy and initial recommendation was for skilled nursing facility however patient elected to go home with home health.  I think this is a reasonable option.  Patient will be discharged home and home health has been ordered.  Discharge Instructions  Discharge Instructions     Diet - low sodium heart healthy   Complete by: As directed    Increase activity slowly   Complete by: As directed    No wound care   Complete by: As directed       Allergies as of 12/02/2020       Reactions   Penicillins Hives, Itching, Swelling   Has patient had a PCN reaction causing immediate rash, facial/tongue/throat swelling, SOB or lightheadedness with hypotension: No Has patient had a PCN reaction causing severe rash involving mucus membranes or skin necrosis: No Has patient had a PCN reaction that required hospitalization: No Has patient had a PCN reaction occurring within the last 10 years:  No If all of the above answers are "NO", then may proceed with Cephalosporin use. Penicillin allergy assessment completed on 03/22/20. Patient has received and tolerated cepholosporins incuding cefepime, ceftriaxone.    Codeine Hives   Codeine Sulfate Nausea Only   Contrast Media [iodinated Diagnostic Agents] Hives    Gadobutrol Hives   Gadolinium Derivatives Hives   Metrizamide Hives   Statins Other (See Comments)   muscle pain        Medication List     STOP taking these medications    doxycycline 100 MG tablet Commonly known as: VIBRA-TABS       TAKE these medications    aspirin 81 MG tablet Take 81 mg by mouth daily.   cephALEXin 500 MG capsule Commonly known as: KEFLEX Take 500 mg by mouth 2 (two) times daily.   cetirizine 10 MG tablet Commonly known as: ZYRTEC Take 10 mg by mouth daily.   cinacalcet 30 MG tablet Commonly known as: SENSIPAR Take 30 mg by mouth Every Tuesday,Thursday,and Saturday with dialysis.   docusate sodium 100 MG capsule Commonly known as: COLACE Take 100 mg by mouth daily.   HYDROcodone-acetaminophen 5-325 MG tablet Commonly known as: Norco Take 1-2 tablets by mouth every 6 (six) hours as needed for moderate pain or severe pain.   latanoprost 0.005 % ophthalmic solution Commonly known as: XALATAN Place 1 drop into both eyes at bedtime.   midodrine 5 MG tablet Commonly known as: PROAMATINE Take 1 tablet (5 mg total) by mouth Every Tuesday,Thursday,and Saturday with dialysis. Please give 30 minutes prior to dialysis. Do NOT hold for dialysis. GIVE DOSE DURING DAYLIGHT HOURS ONLY Start taking on: December 05, 2020   pantoprazole 40 MG tablet Commonly known as: PROTONIX Take 40 mg by mouth daily.   pravastatin 20 MG tablet Commonly known as: PRAVACHOL Take 20 mg by mouth daily.   sevelamer carbonate 800 MG tablet Commonly known as: RENVELA Take 1,600 mg by mouth 3 (three) times daily with meals.   Timolol Maleate (Once-Daily) 0.5 % Soln Place 1 drop into both eyes every morning.   TYLENOL 500 MG tablet Generic drug: acetaminophen Take 500 mg by mouth every 6 (six) hours as needed for moderate pain or headache.        Allergies  Allergen Reactions   Penicillins Hives, Itching and Swelling    Has patient had a PCN reaction causing  immediate rash, facial/tongue/throat swelling, SOB or lightheadedness with hypotension: No Has patient had a PCN reaction causing severe rash involving mucus membranes or skin necrosis: No Has patient had a PCN reaction that required hospitalization: No Has patient had a PCN reaction occurring within the last 10 years: No If all of the above answers are "NO", then may proceed with Cephalosporin use.  Penicillin allergy assessment completed on 03/22/20. Patient has received and tolerated cepholosporins incuding cefepime, ceftriaxone.    Codeine Hives   Codeine Sulfate Nausea Only   Contrast Media [Iodinated Diagnostic Agents] Hives   Gadobutrol Hives   Gadolinium Derivatives Hives   Metrizamide Hives   Statins Other (See Comments)    muscle pain    Consultations: Nephrology   Procedures/Studies: DG Chest Portable 1 View  Result Date: 11/25/2020 CLINICAL DATA:  Hypotension.  Dialysis patient. EXAM: PORTABLE CHEST 1 VIEW COMPARISON:  January 20, 2019 FINDINGS: Right left central lines terminate near the caval atrial junction/just within the right atrium. The heart, hila, and mediastinum are normal. No pneumothorax. No nodules or masses. Mild atelectasis in the  bases. No overt edema. No other acute abnormalities. IMPRESSION: No cause for the patient's symptoms identified. Central lines are in good position. Mild bibasilar atelectasis. Electronically Signed   By: Dorise Bullion III M.D   On: 11/25/2020 10:29   DG C-Arm 1-60 Min-No Report  Result Date: 11/24/2020 Fluoroscopy was utilized by the requesting physician.  No radiographic interpretation.   (Echo, Carotid, EGD, Colonoscopy, ERCP)    Subjective: Seen and examined the day of discharge.  Stable no distress.  Underwent hemodialysis on discharge day without issue.  Discharge Exam: Vitals:   12/02/20 1300 12/02/20 1315  BP: (!) 101/52   Pulse: 74 75  Resp: 14 14  Temp:    SpO2: 99% 99%   Vitals:   12/02/20 1230 12/02/20  1245 12/02/20 1300 12/02/20 1315  BP: (!) 95/49 (!) 95/50 (!) 101/52   Pulse: 75 73 74 75  Resp: 20 20 14 14   Temp:      TempSrc:      SpO2: 100% 99% 99% 99%  Weight:      Height:        General: Pt is alert, awake, not in acute distress Cardiovascular: RRR, S1/S2 +, no rubs, no gallops Respiratory: CTA bilaterally, no wheezing, no rhonchi Abdominal: Soft, NT, ND, bowel sounds + Extremities: no edema, no cyanosis    The results of significant diagnostics from this hospitalization (including imaging, microbiology, ancillary and laboratory) are listed below for reference.     Microbiology: Recent Results (from the past 240 hour(s))  Resp Panel by RT-PCR (Flu A&B, Covid) Nasopharyngeal Swab     Status: None   Collection Time: 11/25/20 12:36 PM   Specimen: Nasopharyngeal Swab; Nasopharyngeal(NP) swabs in vial transport medium  Result Value Ref Range Status   SARS Coronavirus 2 by RT PCR NEGATIVE NEGATIVE Final    Comment: (NOTE) SARS-CoV-2 target nucleic acids are NOT DETECTED.  The SARS-CoV-2 RNA is generally detectable in upper respiratory specimens during the acute phase of infection. The lowest concentration of SARS-CoV-2 viral copies this assay can detect is 138 copies/mL. A negative result does not preclude SARS-Cov-2 infection and should not be used as the sole basis for treatment or other patient management decisions. A negative result may occur with  improper specimen collection/handling, submission of specimen other than nasopharyngeal swab, presence of viral mutation(s) within the areas targeted by this assay, and inadequate number of viral copies(<138 copies/mL). A negative result must be combined with clinical observations, patient history, and epidemiological information. The expected result is Negative.  Fact Sheet for Patients:  EntrepreneurPulse.com.au  Fact Sheet for Healthcare Providers:   IncredibleEmployment.be  This test is no t yet approved or cleared by the [place] FDA and  has been authorized for detection and/or diagnosis of SARS-CoV-2 by FDA under an Emergency Use Authorization (EUA). This EUA will remain  in effect (meaning this test can be used) for the duration of the COVID-19 declaration under Section 564(b)(1) of the Act, 21 U.S.C.section 360bbb-3(b)(1), unless the authorization is terminated  or revoked sooner.       Influenza A by PCR NEGATIVE NEGATIVE Final   Influenza B by PCR NEGATIVE NEGATIVE Final    Comment: (NOTE) The Xpert Xpress SARS-CoV-2/FLU/RSV plus assay is intended as an aid in the diagnosis of influenza from Nasopharyngeal swab specimens and should not be used as a sole basis for treatment. Nasal washings and aspirates are unacceptable for Xpert Xpress SARS-CoV-2/FLU/RSV testing.  Fact Sheet for Patients: EntrepreneurPulse.com.au  Fact Sheet for  Healthcare Providers: IncredibleEmployment.be  This test is not yet approved or cleared by the [place] and has been authorized for detection and/or diagnosis of SARS-CoV-2 by FDA under an Emergency Use Authorization (EUA). This EUA will remain in effect (meaning this test can be used) for the duration of the COVID-19 declaration under Section 564(b)(1) of the Act, 21 U.S.C. section 360bbb-3(b)(1), unless the authorization is terminated or revoked.  Performed at [facility], [address] [place], [place] [address]   Blood culture (routine single)     Status: None   Collection Time: 11/25/20 12:36 PM   Specimen: BLOOD LEFT ARM  Result Value Ref Range Status   Specimen Description BLOOD LEFT ARM  Final   Special Requests   Final    BOTTLES DRAWN AEROBIC AND ANAEROBIC Blood Culture results may not be optimal due to an inadequate volume of blood received in culture bottles   Culture   Final    NO GROWTH 5  DAYS Performed at [facility], [address] [place], [place] [address]    Report Status 11/30/2020 FINAL  Final  Resp Panel by RT-PCR (Flu A&B, Covid) Nasopharyngeal Swab     Status: None   Collection Time: 11/30/20  5:23 AM   Specimen: Nasopharyngeal Swab; Nasopharyngeal(NP) swabs in vial transport medium  Result Value Ref Range Status   SARS Coronavirus 2 by RT PCR NEGATIVE NEGATIVE Final    Comment: (NOTE) SARS-CoV-2 target nucleic acids are NOT DETECTED.  The SARS-CoV-2 RNA is generally detectable in upper respiratory specimens during the acute phase of infection. The lowest concentration of SARS-CoV-2 viral copies this assay can detect is 138 copies/mL. A negative result does not preclude SARS-Cov-2 infection and should not be used as the sole basis for treatment or other patient management decisions. A negative result may occur with  improper specimen collection/handling, submission of specimen other than nasopharyngeal swab, presence of viral mutation(s) within the areas targeted by this assay, and inadequate number of viral copies(<138 copies/mL). A negative result must be combined with clinical observations, patient history, and epidemiological information. The expected result is Negative.  Fact Sheet for Patients:  EntrepreneurPulse.com.au  Fact Sheet for Healthcare Providers:  IncredibleEmployment.be  This test is no t yet approved or cleared by the [place] FDA and  has been authorized for detection and/or diagnosis of SARS-CoV-2 by FDA under an Emergency Use Authorization (EUA). This EUA will remain  in effect (meaning this test can be used) for the duration of the COVID-19 declaration under Section 564(b)(1) of the Act, 21 U.S.C.section 360bbb-3(b)(1), unless the authorization is terminated  or revoked sooner.       Influenza A by PCR NEGATIVE NEGATIVE Final   Influenza B by PCR NEGATIVE NEGATIVE Final     Comment: (NOTE) The Xpert Xpress SARS-CoV-2/FLU/RSV plus assay is intended as an aid in the diagnosis of influenza from Nasopharyngeal swab specimens and should not be used as a sole basis for treatment. Nasal washings and aspirates are unacceptable for Xpert Xpress SARS-CoV-2/FLU/RSV testing.  Fact Sheet for Patients: EntrepreneurPulse.com.au  Fact Sheet for Healthcare Providers: IncredibleEmployment.be  This test is not yet approved or cleared by the [place] FDA and has been authorized for detection and/or diagnosis of SARS-CoV-2 by FDA under an Emergency Use Authorization (EUA). This EUA will remain in effect (meaning this test can be used) for the duration of the COVID-19 declaration under Section 564(b)(1) of the Act, 21 U.S.C. section 360bbb-3(b)(1), unless the authorization is  terminated or revoked.  Performed at [facility], [address] [place], [place] [address]      Labs: BNP (last 3 results) No results for input(s): BNP in the last 8760 hours. Basic Metabolic Panel: Recent Labs  Lab 11/30/20 0035 12/01/20 0635 12/02/20 0820  NA 135 138 136  K 3.4* 3.6 4.0  CL 92* 96* 96*  CO2 27 29 25   GLUCOSE 131* 91 108*  BUN 26* 16 31*  CREATININE 5.65* 4.36* 6.81*  CALCIUM 8.0* 8.2* 8.3*  PHOS  --  3.3  --    Liver Function Tests: No results for input(s): AST, ALT, ALKPHOS, BILITOT, PROT, ALBUMIN in the last 168 hours. No results for input(s): LIPASE, AMYLASE in the last 168 hours. No results for input(s): AMMONIA in the last 168 hours. CBC: Recent Labs  Lab 11/30/20 0035 12/01/20 0635 12/02/20 0930  WBC 10.1 8.4 9.6  NEUTROABS  --   --  6.9  HGB 7.7* 9.5* 8.7*  HCT 24.2* 28.9* 27.0*  MCV 86.4 84.8 86.0  PLT 241 217 203   Cardiac Enzymes: No results for input(s): CKTOTAL, CKMB, CKMBINDEX, TROPONINI in the last 168 hours. BNP: Invalid input(s): POCBNP CBG: Recent Labs  Lab 11/30/20 1642  12/01/20 0749 12/01/20 1128  GLUCAP 105* 85 154*   D-Dimer No results for input(s): DDIMER in the last 72 hours. Hgb A1c No results for input(s): HGBA1C in the last 72 hours. Lipid Profile No results for input(s): CHOL, HDL, LDLCALC, TRIG, CHOLHDL, LDLDIRECT in the last 72 hours. Thyroid function studies No results for input(s): TSH, T4TOTAL, T3FREE, THYROIDAB in the last 72 hours.  Invalid input(s): FREET3 Anemia work up [employer]    11/30/20 0035 11/30/20 0258 12/01/20 0635  VITAMINB12  --   --  459  FOLATE  --  12.4  --   FERRITIN  --  1,275*  --   TIBC  --  224*  --   IRON  --  49  --   RETICCTPCT 2.9  --   --    Urinalysis No results found for: COLORURINE, APPEARANCEUR, LABSPEC, [place], GLUCOSEU, HGBUR, BILIRUBINUR, KETONESUR, PROTEINUR, UROBILINOGEN, NITRITE, LEUKOCYTESUR Sepsis Labs Invalid input(s): PROCALCITONIN,  WBC,  LACTICIDVEN Microbiology Recent Results (from the past 240 hour(s))  Resp Panel by RT-PCR (Flu A&B, Covid) Nasopharyngeal Swab     Status: None   Collection Time: 11/25/20 12:36 PM   Specimen: Nasopharyngeal Swab; Nasopharyngeal(NP) swabs in vial transport medium  Result Value Ref Range Status   SARS Coronavirus 2 by RT PCR NEGATIVE NEGATIVE Final    Comment: (NOTE) SARS-CoV-2 target nucleic acids are NOT DETECTED.  The SARS-CoV-2 RNA is generally detectable in upper respiratory specimens during the acute phase of infection. The lowest concentration of SARS-CoV-2 viral copies this assay can detect is 138 copies/mL. A negative result does not preclude SARS-Cov-2 infection and should not be used as the sole basis for treatment or other patient management decisions. A negative result may occur with  improper specimen collection/handling, submission of specimen other than nasopharyngeal swab, presence of viral mutation(s) within the areas targeted by this assay, and inadequate number of viral copies(<138 copies/mL). A negative result must be  combined with clinical observations, patient history, and epidemiological information. The expected result is Negative.  Fact Sheet for Patients:  EntrepreneurPulse.com.au  Fact Sheet for Healthcare Providers:  IncredibleEmployment.be  This test is no t yet approved or cleared by the [place] FDA and  has been authorized for detection and/or diagnosis  of SARS-CoV-2 by FDA under an Emergency Use Authorization (EUA). This EUA will remain  in effect (meaning this test can be used) for the duration of the COVID-19 declaration under Section 564(b)(1) of the Act, 21 U.S.C.section 360bbb-3(b)(1), unless the authorization is terminated  or revoked sooner.       Influenza A by PCR NEGATIVE NEGATIVE Final   Influenza B by PCR NEGATIVE NEGATIVE Final    Comment: (NOTE) The Xpert Xpress SARS-CoV-2/FLU/RSV plus assay is intended as an aid in the diagnosis of influenza from Nasopharyngeal swab specimens and should not be used as a sole basis for treatment. Nasal washings and aspirates are unacceptable for Xpert Xpress SARS-CoV-2/FLU/RSV testing.  Fact Sheet for Patients: EntrepreneurPulse.com.au  Fact Sheet for Healthcare Providers: IncredibleEmployment.be  This test is not yet approved or cleared by the [place] FDA and has been authorized for detection and/or diagnosis of SARS-CoV-2 by FDA under an Emergency Use Authorization (EUA). This EUA will remain in effect (meaning this test can be used) for the duration of the COVID-19 declaration under Section 564(b)(1) of the Act, 21 U.S.C. section 360bbb-3(b)(1), unless the authorization is terminated or revoked.  Performed at [facility], [address] [place], [place] [address]   Blood culture (routine single)     Status: None   Collection Time: 11/25/20 12:36 PM   Specimen: BLOOD LEFT ARM  Result Value Ref Range Status   Specimen  Description BLOOD LEFT ARM  Final   Special Requests   Final    BOTTLES DRAWN AEROBIC AND ANAEROBIC Blood Culture results may not be optimal due to an inadequate volume of blood received in culture bottles   Culture   Final    NO GROWTH 5 DAYS Performed at [facility], [address] [place], [place] [address]    Report Status 11/30/2020 FINAL  Final  Resp Panel by RT-PCR (Flu A&B, Covid) Nasopharyngeal Swab     Status: None   Collection Time: 11/30/20  5:23 AM   Specimen: Nasopharyngeal Swab; Nasopharyngeal(NP) swabs in vial transport medium  Result Value Ref Range Status   SARS Coronavirus 2 by RT PCR NEGATIVE NEGATIVE Final    Comment: (NOTE) SARS-CoV-2 target nucleic acids are NOT DETECTED.  The SARS-CoV-2 RNA is generally detectable in upper respiratory specimens during the acute phase of infection. The lowest concentration of SARS-CoV-2 viral copies this assay can detect is 138 copies/mL. A negative result does not preclude SARS-Cov-2 infection and should not be used as the sole basis for treatment or other patient management decisions. A negative result may occur with  improper specimen collection/handling, submission of specimen other than nasopharyngeal swab, presence of viral mutation(s) within the areas targeted by this assay, and inadequate number of viral copies(<138 copies/mL). A negative result must be combined with clinical observations, patient history, and epidemiological information. The expected result is Negative.  Fact Sheet for Patients:  EntrepreneurPulse.com.au  Fact Sheet for Healthcare Providers:  IncredibleEmployment.be  This test is no t yet approved or cleared by the [place] FDA and  has been authorized for detection and/or diagnosis of SARS-CoV-2 by FDA under an Emergency Use Authorization (EUA). This EUA will remain  in effect (meaning this test can be used) for the duration of the COVID-19  declaration under Section 564(b)(1) of the Act, 21 U.S.C.section 360bbb-3(b)(1), unless the authorization is terminated  or revoked sooner.       Influenza A by PCR NEGATIVE NEGATIVE Final   Influenza B by PCR NEGATIVE  NEGATIVE Final    Comment: (NOTE) The Xpert Xpress SARS-CoV-2/FLU/RSV plus assay is intended as an aid in the diagnosis of influenza from Nasopharyngeal swab specimens and should not be used as a sole basis for treatment. Nasal washings and aspirates are unacceptable for Xpert Xpress SARS-CoV-2/FLU/RSV testing.  Fact Sheet for Patients: EntrepreneurPulse.com.au  Fact Sheet for Healthcare Providers: IncredibleEmployment.be  This test is not yet approved or cleared by the [place] FDA and has been authorized for detection and/or diagnosis of SARS-CoV-2 by FDA under an Emergency Use Authorization (EUA). This EUA will remain in effect (meaning this test can be used) for the duration of the COVID-19 declaration under Section 564(b)(1) of the Act, 21 U.S.C. section 360bbb-3(b)(1), unless the authorization is terminated or revoked.  Performed at [facility], [address], [place], [place] [address]      Time coordinating discharge: Over 30 minutes  SIGNED:   Sidney Ace, MD  [facility] 12/02/2020, 2:29 PM Pager   If 7PM-7AM, please contact night-coverage

## 2020-12-02 NOTE — Progress Notes (Signed)
[facility]  ROUNDING NOTE   Subjective:   [name] is a 75 y.o.  female with HLD, hypertension, diabetes, pulmonary hypertension, dCHF anemia and ESRD on dialysis, who was admitted to [facility] on 11/30/2020 for Near syncope [R55] ESRD on hemodialysis ([place]) [N18.6, Z99.2] Symptomatic anemia [D64.9]   Patient seen on first floor Patient offers no new specific physical complaints  Objective:  Vital signs in last 24 hours:  Temp:  [98.3 F (36.8 C)-98.6 F (37 C)] 98.5 F (36.9 C) (07/30 0554) Pulse Rate:  [48-85] 79 (07/30 0554) Resp:  [16-18] 18 (07/30 0554) BP: (92-127)/(45-54) 110/51 (07/30 0554) SpO2:  [100 %] 100 % (07/30 0554)  Weight change:  Filed Weights   11/30/20 0028  Weight: 73 kg    Intake/Output: I/O last 3 completed shifts: In: 340 [Blood:340] Out: -    Intake/Output this shift:  No intake/output data recorded.  Physical Exam: General: NAD, laying in bed  Head: Normocephalic, atraumatic. Moist oral mucosal membranes  Eyes: Anicteric  Lungs:  Clear to auscultation, normal effort  Heart: Regular rate and rhythm  Abdomen:  Soft, nontender  Extremities:  1+ peripheral edema.  Neurologic: Nonfocal, moving all four extremities  Skin: No lesions  Access: Rt Permcath     Basic Metabolic Panel: Recent Labs  Lab 11/25/20 0929 11/30/20 0035 12/01/20 0635  NA 136 135 138  K 3.7 3.4* 3.6  CL 97* 92* 96*  CO2 27 27 29   GLUCOSE 126* 131* 91  BUN 46* 26* 16  CREATININE 7.38* 5.65* 4.36*  CALCIUM 8.0* 8.0* 8.2*  PHOS  --   --  3.3    Liver Function Tests: Recent Labs  Lab 11/25/20 0929  AST 20  ALT 10  ALKPHOS 55  BILITOT 0.9  PROT 7.1  ALBUMIN 3.8   No results for input(s): LIPASE, AMYLASE in the last 168 hours. No results for input(s): AMMONIA in the last 168 hours.  CBC: Recent Labs  Lab 11/25/20 0929 11/30/20 0035 12/01/20 0635  WBC 13.0* 10.1 8.4  HGB 8.2* 7.7* 9.5*  HCT 25.7* 24.2* 28.9*  MCV 86.5 86.4  84.8  PLT 188 241 217    Cardiac Enzymes: No results for input(s): CKTOTAL, CKMB, CKMBINDEX, TROPONINI in the last 168 hours.  BNP: Invalid input(s): POCBNP  CBG: Recent Labs  Lab 11/30/20 1642 12/01/20 0749 12/01/20 1128  GLUCAP 105* 85 154*    Microbiology: Results for orders placed or performed during the hospital encounter of 11/30/20  Resp Panel by RT-PCR (Flu A&B, Covid) Nasopharyngeal Swab     Status: None   Collection Time: 11/30/20  5:23 AM   Specimen: Nasopharyngeal Swab; Nasopharyngeal(NP) swabs in vial transport medium  Result Value Ref Range Status   SARS Coronavirus 2 by RT PCR NEGATIVE NEGATIVE Final    Comment: (NOTE) SARS-CoV-2 target nucleic acids are NOT DETECTED.  The SARS-CoV-2 RNA is generally detectable in upper respiratory specimens during the acute phase of infection. The lowest concentration of SARS-CoV-2 viral copies this assay can detect is 138 copies/mL. A negative result does not preclude SARS-Cov-2 infection and should not be used as the sole basis for treatment or other patient management decisions. A negative result may occur with  improper specimen collection/handling, submission of specimen other than nasopharyngeal swab, presence of viral mutation(s) within the areas targeted by this assay, and inadequate number of viral copies(<138 copies/mL). A negative result must be combined with clinical observations, patient history, and epidemiological information. The expected result  is Negative.  Fact Sheet for Patients:  EntrepreneurPulse.com.au  Fact Sheet for Healthcare Providers:  IncredibleEmployment.be  This test is no t yet approved or cleared by the [place] FDA and  has been authorized for detection and/or diagnosis of SARS-CoV-2 by FDA under an Emergency Use Authorization (EUA). This EUA will remain  in effect (meaning this test can be used) for the duration of the COVID-19 declaration  under Section 564(b)(1) of the Act, 21 U.S.C.section 360bbb-3(b)(1), unless the authorization is terminated  or revoked sooner.       Influenza A by PCR NEGATIVE NEGATIVE Final   Influenza B by PCR NEGATIVE NEGATIVE Final    Comment: (NOTE) The Xpert Xpress SARS-CoV-2/FLU/RSV plus assay is intended as an aid in the diagnosis of influenza from Nasopharyngeal swab specimens and should not be used as a sole basis for treatment. Nasal washings and aspirates are unacceptable for Xpert Xpress SARS-CoV-2/FLU/RSV testing.  Fact Sheet for Patients: EntrepreneurPulse.com.au  Fact Sheet for Healthcare Providers: IncredibleEmployment.be  This test is not yet approved or cleared by the [place] FDA and has been authorized for detection and/or diagnosis of SARS-CoV-2 by FDA under an Emergency Use Authorization (EUA). This EUA will remain in effect (meaning this test can be used) for the duration of the COVID-19 declaration under Section 564(b)(1) of the Act, 21 U.S.C. section 360bbb-3(b)(1), unless the authorization is terminated or revoked.  Performed at [facility], [address] [place], [place] [address]     Coagulation Studies: Recent Labs    11/30/20 0523  LABPROT 14.0  INR 1.1    Urinalysis: No results for input(s): COLORURINE, LABSPEC, PHURINE, GLUCOSEU, HGBUR, BILIRUBINUR, KETONESUR, PROTEINUR, UROBILINOGEN, NITRITE, LEUKOCYTESUR in the last 72 hours.  Invalid input(s): APPERANCEUR    Imaging: No results found.   Medications:     aspirin EC  81 mg Oral Daily   cephALEXin  500 mg Oral BID   Chlorhexidine Gluconate Cloth  6 each Topical Q0600   cinacalcet  30 mg Oral Q T,Th,Sa-HD   docusate sodium  100 mg Oral Daily   fluticasone  2 spray Each Nare Daily   latanoprost  1 drop Both Eyes QHS   midodrine  5 mg Oral Q T,Th,Sa-HD   pantoprazole  40 mg Oral Daily   pravastatin  20 mg Oral Daily   sevelamer  carbonate  1,600 mg Oral TID WC   timolol  1 drop Both Eyes q morning   acetaminophen, alteplase, heparin, HYDROcodone-acetaminophen, hydrOXYzine, lidocaine (PF), lidocaine-prilocaine, loratadine, pentafluoroprop-tetrafluoroeth  Assessment/ Plan:  [name] is a 75 y.o.  female  with HLD, hypertension, diabetes, pulmonary hypertension, dCHF anemia and ESRD on dialysis, who was admitted to [facility] on 11/30/2020 for Near syncope [R55] ESRD on hemodialysis ([place]) [N18.6, Z99.2] Symptomatic anemia [D64.9]   [facility] [address]/TTS/ Rt Permcath       1)Renal    End-stage renal disease Patient is on hemodialysis Patient is on Tuesday Thursday Saturday schedule We will dialyze patient today   2) hypotension  Blood pressure is stable  Patient is on midodrine before dialysis  3)Anemia of chronic disease  CBC Latest Ref Rng & Units 12/01/2020 11/30/2020 11/25/2020  WBC 4.0 - 10.5 K/uL 8.4 10.1 13.0(H)  Hemoglobin 12.0 - 15.0 g/dL 9.5(L) 7.7(L) 8.2(L)  Hematocrit 36.0 - 46.0 % 28.9(L) 24.2(L) 25.7(L)  Platelets 150 - 400 K/uL 217 241 188       HGb at goal (9--11) Patient did require PRBC during this  admission  4) Secondary hyperparathyroidism -CKD Mineral-Bone Disorder    Lab Results  Component Value Date   CALCIUM 8.2 (L) 12/01/2020   CAION 1.06 (L) 11/24/2020   PHOS 3.3 12/01/2020    Secondary Hyperparathyroidism present Patient is on Sensipar Phosphorus at goal.   5) diabetes mellitus Primary team is following  6) Electrolytes   BMP Latest Ref Rng & Units 12/01/2020 11/30/2020 11/25/2020  Glucose 70 - 99 mg/dL 91 131(H) 126(H)  BUN 8 - 23 mg/dL 16 26(H) 46(H)  Creatinine 0.44 - 1.00 mg/dL 4.36(H) 5.65(H) 7.38(H)  Sodium 135 - 145 mmol/L 138 135 136  Potassium 3.5 - 5.1 mmol/L 3.6 3.4(L) 3.7  Chloride 98 - 111 mmol/L 96(L) 92(L) 97(L)  CO2 22 - 32 mmol/L 29 27 27   Calcium 8.9 - 10.3 mg/dL 8.2(L) 8.0(L) 8.0(L)      Sodium Normonatremic   Potassium Normokalemic    7)Acid base    Co2 at goal    Plan   We will dialyze patient today We will keep patient on Epogen    LOS: 0 Demaree Liberto s [facility] 7/30/20228:32 AM

## 2020-12-02 NOTE — TOC Initial Note (Addendum)
Transition of Care [facility]) - Initial/Assessment Note    Patient Details  Name: Kelli Nguyen MRN: [id number] Date of Birth: Jan 17, 1946  [facility] CM/SW Contact:    Magnus Ivan, LCSW Phone Number: 12/02/2020, 2:28 PM  Clinical Narrative:             CSW spoke to patient regarding discharge planning. Patient lives with son. Daughter provides transportation. PCP is Crissie Figures. [facility]acy [facility]. Confirmed home address in chart. Patient has a walker. Patient would like to discharge home with home health. Explained agency options. Reached out to [place] agencies to see if anyone can accept her. Waiting for responses.   3:00- Tommi Rumps with [facility] has accepted home health referral for PT and OT.   Expected Discharge Plan: [place] Barriers to Discharge: Barriers Resolved   Patient Goals and CMS Choice Patient states their goals for this hospitalization and ongoing recovery are:: home with home health CMS Medicare.gov Compare Post Acute Care list provided to:: Patient Choice offered to / list presented to : Patient  Expected Discharge Plan and Services Expected Discharge Plan: [place]       Living arrangements for the past 2 months: Single Family Home                           HH Arranged: PT, OT [facility] Agency: [place] ([place]) Date HH Agency Contacted: 12/02/20   Representative spoke with at [place]: Corene Cornea  Prior Living Arrangements/Services Living arrangements for the past 2 months: [place] Lives with:: Adult Children Patient language and need for interpreter reviewed:: Yes Do you feel safe going back to the place where you live?: Yes      Need for Family Participation in Patient Care: Yes (Comment) Care giver support system in place?: Yes (comment) Current home services: DME Criminal Activity/Legal Involvement Pertinent to Current Situation/Hospitalization: No - Comment as  needed  Activities of Daily Living Home Assistive Devices/Equipment: Raised toilet seat with rails, Shower chair with back, Walker (specify type) ADL Screening (condition at time of admission) Patient's cognitive ability adequate to safely complete daily activities?: Yes Is the patient deaf or have difficulty hearing?: No Does the patient have difficulty seeing, even when wearing glasses/contacts?: No Does the patient have difficulty concentrating, remembering, or making decisions?: No Patient able to express need for assistance with ADLs?: Yes Does the patient have difficulty dressing or bathing?: No Independently performs ADLs?: Yes (appropriate for developmental age) Does the patient have difficulty walking or climbing stairs?: Yes Weakness of Legs: Both Weakness of Arms/Hands: Right  Permission Sought/Granted Permission sought to share information with : Facility Art therapist granted to share information with : Yes, Verbal Permission Granted     Permission granted to share info w AGENCY: home health        Emotional Assessment       Orientation: : Oriented to Self, Oriented to Place, Oriented to  Time, Oriented to Situation Alcohol / Substance Use: Not Applicable Psych Involvement: No (comment)  Admission diagnosis:  Near syncope [R55] ESRD on hemodialysis ([place]) [N18.6, Z99.2] Symptomatic anemia [D64.9] Patient Active Problem List   Diagnosis Date Noted   Symptomatic anemia 11/30/2020   Anemia in ESRD (end-stage renal disease) ([place]) 11/30/2020   HTN (hypertension) 11/30/2020   Chronic diastolic CHF (congestive heart failure) ([place]) 11/30/2020   Hypokalemia 11/30/2020   Elevated troponin 11/30/2020  Prosthetic joint infection of left hip ([place]) 11/30/2020   Pulmonary hypertension, unspecified ([place]) 08/21/2020   Valvular heart disease 08/21/2020   Risk for falls 12/30/2019   Atherosclerosis of native arteries of extremity with intermittent  claudication ([place]) 09/27/2019   Leg pain 09/06/2019   Pain due to onychomycosis of toenails of both feet 05/24/2019   Type 2 diabetes mellitus with vascular disease ([place]) 05/24/2019   Bacterial infection, unspecified 02/04/2019   ESRD (end stage renal disease) ([place]) 01/21/2019   Septic shock (HCC)    Infected prosthetic vascular graft ([place]) 01/19/2019   GERD (gastroesophageal reflux disease) 01/18/2019   Arthritis 01/18/2019   Chronic pain syndrome 01/18/2019   Constipation 01/18/2019   Glaucoma 01/18/2019   Muscle weakness 01/18/2019   Pain of left hip joint 01/18/2019   Pressure ulcer of sacral region 01/18/2019   Diabetes mellitus with kidney complication ([place]) 82/50/0370   Obesity (BMI 30-39.9) 11/12/2017   Red blood cell antibody positive 11/12/2017   Sepsis, unspecified organism ([place]) 06/28/2017   Closed displaced fracture of left femoral neck ([place]) 03/09/2017   End stage renal failure on dialysis ([place]) 48/88/9169   Complication of vascular access for dialysis 03/17/2016   Superior vena cava compression syndrome 03/17/2016   Hyperlipidemia 03/17/2016   Moderate protein-calorie malnutrition ([place]) 03/11/2016   Bilateral hand numbness 02/22/2015   Bilateral hand pain 02/22/2015   Hand weakness 02/22/2015   History of adenomatous polyp of colon 10/12/2014   Pruritus, unspecified 07/25/2014   Left ventricular dysfunction 05/25/2014   Iron deficiency anemia, unspecified 04/25/2014   Iron deficiency anemia 04/25/2014   Carpal tunnel syndrome on right 04/07/2014   Allergy, unspecified, sequela 01/21/2014   Pain, unspecified 01/21/2014   Diarrhea, unspecified 01/21/2014   Cancer of kidney ([place]) 10/06/2013   Coagulation defect, unspecified ([place]) 07/21/2013   Fever, unspecified 07/21/2013   Headache, unspecified 07/21/2013   Shortness of breath 07/21/2013   Encounter for immunization 02/15/2013   Hypotension 11/10/2012   CAD (coronary artery disease) 11/10/2012   Coronary  artery disease involving native coronary artery of native heart without angina pectoris 11/10/2012   Anemia in chronic kidney disease 06/26/2009   Dependence on renal dialysis ([place]) 06/26/2009   Secondary hyperparathyroidism of renal origin ([place]) 02/28/2003   Hypertensive chronic kidney disease with stage 5 chronic kidney disease or end stage renal disease ([place]) 04/01/2001   Type 2 diabetes mellitus without complications ([place]) 45/07/8880   End stage renal disease ([place]) 04/01/2001   PCP:  Crissie Figures, PA-C Pharmacy:   [id number] [place] [address] [place] [address] Phone: [phone] Fax: [phone]     Social Determinants of Health (SDOH) Interventions    Readmission Risk Interventions No flowsheet data found.

## 2020-12-02 NOTE — Progress Notes (Signed)
PROGRESS NOTE    Kelli Nguyen  ZYS:[id number] DOB: December 15, 1945 DOA: 11/30/2020 PCP: Crissie Figures, PA-C   Brief Narrative:  75 y.o. female with medical history significant of ESRD-HD (TTS), hypertension, hyperlipidemia, diabetes mellitus, GERD, right bundle blockage, pulmonary hypertension, dCHF, PVD, CAD, anemia, who presents with weakness and dizziness.   Patient states that in the past 5 days, she has been having generalized weakness, dizziness, lightheadedness.  She feels like she is going to pass out but did not.  No unilateral numbness or tinglings in extremities, no facial droop or slurred speech.  Denies chest pain and shortness of breath.  She has mild dry cough. No fever or chills.  She does not have nausea, vomiting, diarrhea or abdominal pain. Patient denies dark stool or rectal bleeding.  She states that her blood pressure has been low.   Of note, pt recently had vascular surgery for HERO vascular graft placement to right arm on 11/24/20. Before the procedure, she had dizziness and generalized weakness. She was evaluated in ED and found that her hemoglobin was lower than it was a few weeks ago before the surgery.  The ED physician spoke with the vascular surgeon who said she should be fine. Pt states that she is taking antibiotics chronically for infection of prosthetic left hip joint. She is following up with infectious disease in [facility].   Assessment & Plan:   Principal Problem:   Symptomatic anemia Active Problems:   End stage renal failure on dialysis (HCC)   Hyperlipidemia   CAD (coronary artery disease)   Diabetes mellitus with kidney complication (HCC)   Anemia in ESRD (end-stage renal disease) (HCC)   HTN (hypertension)   Chronic diastolic CHF (congestive heart failure) (HCC)   Hypokalemia   Elevated troponin   Prosthetic joint infection of left hip (HCC)  Symptomatic anemia and anemia in ESRD (end-stage renal disease) : Patient's lightheadedness and dizziness are  likely due to symptomatic anemia.  No focal neurodeficit on physical examination.  Low suspicions for stroke.  Hemoglobin dropped from 10.5 to 7.7.  Patient does not have rectal bleeding or dark stool.  FOBT is negative.  Does not seem to have GI bleeding.  His anemia is likely due to ESRD.  -Transfused 1 unit PRBC -Post transfusion H&H stable -Nephrology following for inpatient hemodialysis   End stage renal failure on dialysis (TTS) -Dr. Holley Raring of renal is consulted for HD   Hyperlipidemia -Pravastatin   CAD (coronary artery disease) and elevated trop: Trop 17 -->18. No chest pain.  Minimally elevated troponins likely due to decreased clearance secondary to ESRD. -Aspirin, pravastatin   Diet controlled diabetes mellitus with kidney complication ([place]): No X3A on record.  Blood sugar 131.  Patient not taking medications currently -Check blood sugar every morning   HTN (hypertension): Blood pressure soft.  Patient is not taking medications currently -Monitor blood pressure closely   Chronic diastolic CHF (congestive heart failure) ([place]): 2D echo on 09/20/2020 showed EF of 55 to 60% with grade 2 diastolic dysfunction.  Patient has trace leg edema, no shortness of breath, CHF seem to be compensated. -Volume management per renal by dialysis   Hypokalemia: Potassium 3.4 -Expecting correction with dialysis   Prosthetic joint infection of left hip [facility]): Patient taking Keflex chronically.  No fever or leukocytosis.  No sepsis. -Continue home Keflex  DVT prophylaxis: SQ heparin Code Status: Full Family Communication: None  disposition Plan: Status is: Observation  The patient remains OBS appropriate and will d/c before 2  midnights.  Dispo: The patient is from: Home              Anticipated d/c is to: Home              Patient currently is not medically stable to d/c.   Difficult to place patient No       Level of care: Med-Surg  Consultants:  Nephrology  Procedures:   Hemodialysis  Antimicrobials:  Keflex, on chronically   Subjective: Seen and examined.  Symptoms of lightheaded and dizziness have improved.  Objective: Vitals:   12/02/20 1230 12/02/20 1245 12/02/20 1300 12/02/20 1315  BP: (!) 95/49 (!) 95/50 (!) 101/52   Pulse: 75 73 74 75  Resp: 20 20 14 14   Temp:      TempSrc:      SpO2: 100% 99% 99% 99%  Weight:      Height:        Intake/Output Summary (Last 24 hours) at 12/02/2020 1433 Last data filed at 12/02/2020 1245 Gross per 24 hour  Intake --  Output 504 ml  Net -504 ml   Filed Weights   11/30/20 0028  Weight: 73 kg    Examination:  General exam: Appears calm and comfortable  Respiratory system: Clear to auscultation. Respiratory effort normal. Cardiovascular system: S1 & S2 heard, RRR. No JVD, murmurs, rubs, gallops or clicks. No pedal edema. Gastrointestinal system: Abdomen is nondistended, soft and nontender. No organomegaly or masses felt. Normal bowel sounds heard. Central nervous system: Alert and oriented. No focal neurological deficits. Extremities: Symmetric 5 x 5 power. Skin: No rashes, lesions or ulcers Psychiatry: Judgement and insight appear normal. Mood & affect appropriate.     Data Reviewed: I have personally reviewed following labs and imaging studies  CBC: Recent Labs  Lab 11/30/20 0035 12/01/20 0635 12/02/20 0930  WBC 10.1 8.4 9.6  NEUTROABS  --   --  6.9  HGB 7.7* 9.5* 8.7*  HCT 24.2* 28.9* 27.0*  MCV 86.4 84.8 86.0  PLT 241 217 161   Basic Metabolic Panel: Recent Labs  Lab 11/30/20 0035 12/01/20 0635 12/02/20 0820  NA 135 138 136  K 3.4* 3.6 4.0  CL 92* 96* 96*  CO2 27 29 25   GLUCOSE 131* 91 108*  BUN 26* 16 31*  CREATININE 5.65* 4.36* 6.81*  CALCIUM 8.0* 8.2* 8.3*  PHOS  --  3.3  --    GFR: Estimated Creatinine Clearance: 6.2 mL/min (A) (by C-G formula based on SCr of 6.81 mg/dL (H)). Liver Function Tests: No results for input(s): AST, ALT, ALKPHOS, BILITOT, PROT,  ALBUMIN in the last 168 hours. No results for input(s): LIPASE, AMYLASE in the last 168 hours. No results for input(s): AMMONIA in the last 168 hours. Coagulation Profile: Recent Labs  Lab 11/30/20 0523  INR 1.1   Cardiac Enzymes: No results for input(s): CKTOTAL, CKMB, CKMBINDEX, TROPONINI in the last 168 hours. BNP (last 3 results) No results for input(s): PROBNP in the last 8760 hours. HbA1C: No results for input(s): HGBA1C in the last 72 hours. CBG: Recent Labs  Lab 11/30/20 1642 12/01/20 0749 12/01/20 1128  GLUCAP 105* 85 154*   Lipid Profile: No results for input(s): CHOL, HDL, LDLCALC, TRIG, CHOLHDL, LDLDIRECT in the last 72 hours. Thyroid Function Tests: No results for input(s): TSH, T4TOTAL, FREET4, T3FREE, THYROIDAB in the last 72 hours. Anemia Panel: Recent Labs    11/30/20 0035 11/30/20 0258 12/01/20 0635  [id number]  --   --  459  FOLATE  --  12.4  --   FERRITIN  --  1,275*  --   TIBC  --  224*  --   IRON  --  49  --   RETICCTPCT 2.9  --   --    Sepsis Labs: No results for input(s): PROCALCITON, LATICACIDVEN in the last 168 hours.  Recent Results (from the past 240 hour(s))  Resp Panel by RT-PCR (Flu A&B, Covid) Nasopharyngeal Swab     Status: None   Collection Time: 11/25/20 12:36 PM   Specimen: Nasopharyngeal Swab; Nasopharyngeal(NP) swabs in vial transport medium  Result Value Ref Range Status   SARS Coronavirus 2 by RT PCR NEGATIVE NEGATIVE Final    Comment: (NOTE) SARS-CoV-2 target nucleic acids are NOT DETECTED.  The SARS-CoV-2 RNA is generally detectable in upper respiratory specimens during the acute phase of infection. The lowest concentration of SARS-CoV-2 viral copies this assay can detect is 138 copies/mL. A negative result does not preclude SARS-Cov-2 infection and should not be used as the sole basis for treatment or other patient management decisions. A negative result may occur with  improper specimen collection/handling,  submission of specimen other than nasopharyngeal swab, presence of viral mutation(s) within the areas targeted by this assay, and inadequate number of viral copies(<138 copies/mL). A negative result must be combined with clinical observations, patient history, and epidemiological information. The expected result is Negative.  Fact Sheet for Patients:  EntrepreneurPulse.com.au  Fact Sheet for Healthcare Providers:  IncredibleEmployment.be  This test is no t yet approved or cleared by the [place] FDA and  has been authorized for detection and/or diagnosis of SARS-CoV-2 by FDA under an Emergency Use Authorization (EUA). This EUA will remain  in effect (meaning this test can be used) for the duration of the COVID-19 declaration under Section 564(b)(1) of the Act, 21 U.S.C.section 360bbb-3(b)(1), unless the authorization is terminated  or revoked sooner.       Influenza A by PCR NEGATIVE NEGATIVE Final   Influenza B by PCR NEGATIVE NEGATIVE Final    Comment: (NOTE) The Xpert Xpress SARS-CoV-2/FLU/RSV plus assay is intended as an aid in the diagnosis of influenza from Nasopharyngeal swab specimens and should not be used as a sole basis for treatment. Nasal washings and aspirates are unacceptable for Xpert Xpress SARS-CoV-2/FLU/RSV testing.  Fact Sheet for Patients: EntrepreneurPulse.com.au  Fact Sheet for Healthcare Providers: IncredibleEmployment.be  This test is not yet approved or cleared by the [place] FDA and has been authorized for detection and/or diagnosis of SARS-CoV-2 by FDA under an Emergency Use Authorization (EUA). This EUA will remain in effect (meaning this test can be used) for the duration of the COVID-19 declaration under Section 564(b)(1) of the Act, 21 U.S.C. section 360bbb-3(b)(1), unless the authorization is terminated or revoked.  Performed at [facility], [address] [place], [place] [address]   Blood culture (routine single)     Status: None   Collection Time: 11/25/20 12:36 PM   Specimen: BLOOD LEFT ARM  Result Value Ref Range Status   Specimen Description BLOOD LEFT ARM  Final   Special Requests   Final    BOTTLES DRAWN AEROBIC AND ANAEROBIC Blood Culture results may not be optimal due to an inadequate volume of blood received in culture bottles   Culture   Final    NO GROWTH 5 DAYS Performed at [facility], [address], [place], [place] [address]    Report Status 11/30/2020 FINAL  Final  Resp Panel by RT-PCR (Flu  A&B, Covid) Nasopharyngeal Swab     Status: None   Collection Time: 11/30/20  5:23 AM   Specimen: Nasopharyngeal Swab; Nasopharyngeal(NP) swabs in vial transport medium  Result Value Ref Range Status   SARS Coronavirus 2 by RT PCR NEGATIVE NEGATIVE Final    Comment: (NOTE) SARS-CoV-2 target nucleic acids are NOT DETECTED.  The SARS-CoV-2 RNA is generally detectable in upper respiratory specimens during the acute phase of infection. The lowest concentration of SARS-CoV-2 viral copies this assay can detect is 138 copies/mL. A negative result does not preclude SARS-Cov-2 infection and should not be used as the sole basis for treatment or other patient management decisions. A negative result may occur with  improper specimen collection/handling, submission of specimen other than nasopharyngeal swab, presence of viral mutation(s) within the areas targeted by this assay, and inadequate number of viral copies(<138 copies/mL). A negative result must be combined with clinical observations, patient history, and epidemiological information. The expected result is Negative.  Fact Sheet for Patients:  EntrepreneurPulse.com.au  Fact Sheet for Healthcare Providers:  IncredibleEmployment.be  This test is no t yet approved or cleared by the [place] FDA and  has been  authorized for detection and/or diagnosis of SARS-CoV-2 by FDA under an Emergency Use Authorization (EUA). This EUA will remain  in effect (meaning this test can be used) for the duration of the COVID-19 declaration under Section 564(b)(1) of the Act, 21 U.S.C.section 360bbb-3(b)(1), unless the authorization is terminated  or revoked sooner.       Influenza A by PCR NEGATIVE NEGATIVE Final   Influenza B by PCR NEGATIVE NEGATIVE Final    Comment: (NOTE) The Xpert Xpress SARS-CoV-2/FLU/RSV plus assay is intended as an aid in the diagnosis of influenza from Nasopharyngeal swab specimens and should not be used as a sole basis for treatment. Nasal washings and aspirates are unacceptable for Xpert Xpress SARS-CoV-2/FLU/RSV testing.  Fact Sheet for Patients: EntrepreneurPulse.com.au  Fact Sheet for Healthcare Providers: IncredibleEmployment.be  This test is not yet approved or cleared by the [place] FDA and has been authorized for detection and/or diagnosis of SARS-CoV-2 by FDA under an Emergency Use Authorization (EUA). This EUA will remain in effect (meaning this test can be used) for the duration of the COVID-19 declaration under Section 564(b)(1) of the Act, 21 U.S.C. section 360bbb-3(b)(1), unless the authorization is terminated or revoked.  Performed at [facility], [address], [place], [place] [address]          Radiology Studies: No results found.      Scheduled Meds:  aspirin EC  81 mg Oral Daily   cephALEXin  500 mg Oral BID   Chlorhexidine Gluconate Cloth  6 each Topical Q0600   cinacalcet  30 mg Oral Q T,Th,Sa-HD   docusate sodium  100 mg Oral Daily   epoetin (EPOGEN/PROCRIT) injection  6,000 Units Intravenous Q T,Th,Sa-HD   fluticasone  2 spray Each Nare Daily   latanoprost  1 drop Both Eyes QHS   midodrine  5 mg Oral Q T,Th,Sa-HD   pantoprazole  40 mg Oral Daily   pravastatin  20 mg Oral Daily    sevelamer carbonate  1,600 mg Oral TID WC   timolol  1 drop Both Eyes q morning   Continuous Infusions:   LOS: 0 days    Time spent: 25 minutes    Sidney Ace, MD [facility] Pager [phone]  If 7PM-7AM, please contact night-coverage  12/02/2020, 2:33 PM

## 2020-12-02 NOTE — Progress Notes (Signed)
Pt tolerated HD without incident.  UF goal of 0.5L achieved, no c/o offered. Report given to primary RN.

## 2020-12-03 LAB — BPAM RBC
Blood Product Expiration Date: 202208172359
Blood Product Expiration Date: 202208192359
Blood Product Expiration Date: 202208252359
ISSUE DATE / TIME: 202207282055
Unit Type and Rh: 5100
Unit Type and Rh: 6200
Unit Type and Rh: 6200

## 2020-12-03 LAB — TYPE AND SCREEN
ABO/RH(D): A POS
Antibody Screen: POSITIVE
PT AG Type: POSITIVE
Unit division: 0
Unit division: 0
Unit division: 0

## 2020-12-12 ENCOUNTER — Other Ambulatory Visit (INDEPENDENT_AMBULATORY_CARE_PROVIDER_SITE_OTHER): Payer: Self-pay | Admitting: Vascular Surgery

## 2020-12-12 ENCOUNTER — Telehealth (INDEPENDENT_AMBULATORY_CARE_PROVIDER_SITE_OTHER): Payer: Self-pay | Admitting: Vascular Surgery

## 2020-12-12 DIAGNOSIS — N186 End stage renal disease: Secondary | ICD-10-CM

## 2020-12-12 DIAGNOSIS — Z95828 Presence of other vascular implants and grafts: Secondary | ICD-10-CM

## 2020-12-12 NOTE — Telephone Encounter (Signed)
Made patient aware of medical advice.

## 2020-12-12 NOTE — Telephone Encounter (Signed)
Daughter called stating that patient is having tightness/numbness of both hands and feet slightly discolored but sometimes gets cold. Daughter states symptoms have worsened since procedure 11/24/20 insertion of hero graft (GS). Patient is due to come in tomorrow but needed to know if she should go to ED today. Please advise.

## 2020-12-13 ENCOUNTER — Ambulatory Visit (INDEPENDENT_AMBULATORY_CARE_PROVIDER_SITE_OTHER): Payer: Medicare Other | Admitting: Nurse Practitioner

## 2020-12-13 ENCOUNTER — Ambulatory Visit (INDEPENDENT_AMBULATORY_CARE_PROVIDER_SITE_OTHER): Payer: Medicare Other

## 2020-12-13 ENCOUNTER — Encounter (INDEPENDENT_AMBULATORY_CARE_PROVIDER_SITE_OTHER): Payer: Self-pay | Admitting: Nurse Practitioner

## 2020-12-13 ENCOUNTER — Other Ambulatory Visit: Payer: Self-pay

## 2020-12-13 VITALS — BP 93/55 | HR 83 | Ht 59.0 in | Wt 160.0 lb

## 2020-12-13 DIAGNOSIS — Z95828 Presence of other vascular implants and grafts: Secondary | ICD-10-CM | POA: Diagnosis not present

## 2020-12-13 DIAGNOSIS — N186 End stage renal disease: Secondary | ICD-10-CM

## 2020-12-13 DIAGNOSIS — E1159 Type 2 diabetes mellitus with other circulatory complications: Secondary | ICD-10-CM

## 2020-12-13 DIAGNOSIS — E782 Mixed hyperlipidemia: Secondary | ICD-10-CM

## 2020-12-13 NOTE — Progress Notes (Signed)
Subjective:    Patient ID: Kelli Nguyen, female    DOB: 08-Jan-1946, 75 y.o.   MRN: [id number] Chief Complaint  Patient presents with  . Follow-up    2 wk [facility] post insertion of hero hda     Kelli Nguyen is a 75 year old female that returns today following placement of a right upper extremity hero graft on 11/24/2020.  The patient notes that she has had swelling in her arm as well as her hand following the procedure.  What is also concern to the patient is the feeling of a cold sensation and some numbness in her right hand.  She notes that she did have some slight numbness before her procedure but is worse now.  Also concern for the patient she is decreased function within her first 2 fingers.  She notes that this only happened following her procedure.  She denies any open wounds or ulcerations.  She denies any fevers or chills.  Noninvasive studies today show flow volume of 1355 with no evidence of significant stenosis in the new hero graft  .  Radial artery shows antegrade flows.   Review of Systems  Skin:  Positive for color change and wound.  All other systems reviewed and are negative.     Objective:   Physical Exam Vitals reviewed.  HENT:     Head: Normocephalic.  Cardiovascular:     Rate and Rhythm: Normal rate.     Pulses:          Radial pulses are 0 on the right side.  Pulmonary:     Effort: Pulmonary effort is normal.  Skin:    General: Skin is warm.  Neurological:     Mental Status: She is alert and oriented to person, place, and time.  Psychiatric:        Mood and Affect: Mood normal.        Behavior: Behavior normal.        Thought Content: Thought content normal.        Judgment: Judgment normal.    BP (!) 93/55   Pulse 83   Ht 4\' 11"  (1.499 m)   Wt 160 lb (72.6 kg)   BMI 32.32 kg/m   Past Medical History:  Diagnosis Date  . Anemia   . Carpal tunnel syndrome of left wrist   . Cataract, bilateral   . Chronic cough   . Closed displaced fracture  of left femoral neck ([place]) 03/09/2017  . Coronary artery disease    s/p 3v CABG (LIMA-LAD, SVG-OM1, SVG-PDA) in 2008  . Diabetes mellitus without complication ([place])   . ESRD (end stage renal disease) on dialysis ([place])    T-TH-SAT  . GERD (gastroesophageal reflux disease)   . Grade II diastolic dysfunction   . HLD (hyperlipidemia)   . Hypertension   . Murmur   . OA (osteoarthritis)   . PAD (peripheral artery disease) ([place])   . Pneumonia   . Polyneuropathy    feet and hands  . Pulmonary hypertension ([place])   . RBBB   . Secondary hyperparathyroidism of renal origin ([place])   . Sepsis ([place]) 06/28/2017  . Wears dentures    partial upper and lower    Social History   Socioeconomic History  . Marital status: Widowed    Spouse name: Not on file  . Number of children: 5  . Years of education: Not on file  . Highest education level: Not on file  Occupational History  . Not on  file  Tobacco Use  . Smoking status: Never  . Smokeless tobacco: Never  Vaping Use  . Vaping Use: Never used  Substance and Sexual Activity  . Alcohol use: No  . Drug use: No  . Sexual activity: Not on file  Other Topics Concern  . Not on file  Social History Narrative   Lives with son   Social Determinants of Health   Financial Resource Strain: Not on file  Food Insecurity: Not on file  Transportation Needs: Not on file  Physical Activity: Not on file  Stress: Not on file  Social Connections: Not on file  Intimate Partner Violence: Not on file    Past Surgical History:  Procedure Laterality Date  . A/V SHUNT INTERVENTION N/A 10/16/2016   Procedure: A/V Shunt Intervention;  Surgeon: Katha Cabal, MD;  Location: [place] [facility];  Service: Cardiovascular;  Laterality: N/A;  . A/V SHUNTOGRAM Left 10/16/2016   Procedure: A/V Shuntogram;  Surgeon: Katha Cabal, MD;  Location: [place] [facility];  Service: Cardiovascular;  Laterality: Left;  . ABDOMINAL HYSTERECTOMY    . CARDIAC  CATHETERIZATION N/A 02/26/2014  . CARPAL TUNNEL RELEASE Left 09/27/2015   Procedure: CARPAL TUNNEL RELEASE;  Surgeon: Leanor Kail, MD;  Location: [facility];  Service: Orthopedics;  Laterality: Left;  . CATARACT EXTRACTION W/PHACO Right 09/23/2016   Procedure: CATARACT EXTRACTION PHACO AND INTRAOCULAR LENS PLACEMENT (IOC) complicated diabetic right;  Surgeon: Ronnell Freshwater, MD;  Location: [place];  Service: Ophthalmology;  Laterality: Right;  Diabetic - diet controlled Potassium draw before surgery  . CATARACT EXTRACTION W/PHACO Left 01/20/2017   Procedure: CATARACT EXTRACTION PHACO AND INTRAOCULAR LENS PLACEMENT ([place]) LEFT DIABETIC;  Surgeon: Eulogio Bear, MD;  Location: [place];  Service: Ophthalmology;  Laterality: Left;  Diabetic - diet controlled Potassium draw prior to procedure  8:00 ARRIVAL  . COLONOSCOPY    . COLONOSCOPY WITH PROPOFOL N/A 11/28/2014   Procedure: COLONOSCOPY WITH PROPOFOL;  Surgeon: Manya Silvas, MD;  Location: [facility];  Service: Endoscopy;  Laterality: N/A;  . CORONARY ARTERY BYPASS GRAFT N/A 2008   3v; LIMA-LAD, SVG-OM1, SVG-PDA; Location: [facility]; Surgeon: Jannett Celestine, MD  . DIALYSIS/PERMA CATHETER INSERTION Left 01/21/2019   Procedure: DIALYSIS/PERMA CATHETER EXCHANGE;  Surgeon: Algernon Huxley, MD;  Location: [place] [facility];  Service: Cardiovascular;  Laterality: Left;  . HEMIARTHROPLASTY HIP Left 2018  . INSERTION OF DIALYSIS CATHETER Left 01/19/2019   Procedure: INSERTION OF DIALYSIS CATHETER;  Surgeon: Katha Cabal, MD;  Location: [facility];  Service: Vascular;  Laterality: Left;  . LIGATIONS OF HERO GRAFT Left 01/19/2019   Procedure: EXCISION LEFT HERO GRAFT;  Surgeon: Katha Cabal, MD;  Location: [facility];  Service: Vascular;  Laterality: Left;  . NEPHRECTOMY Left   . PERIPHERAL VASCULAR CATHETERIZATION Left 01/18/2015   Procedure: A/V Shuntogram/Fistulagram;  Surgeon: Katha Cabal, MD;   Location: [place] [facility];  Service: Cardiovascular;  Laterality: Left;  . PERIPHERAL VASCULAR CATHETERIZATION Left 01/18/2015   Procedure: A/V Shunt Intervention;  Surgeon: Katha Cabal, MD;  Location: [place] [facility];  Service: Cardiovascular;  Laterality: Left;  . PERIPHERAL VASCULAR CATHETERIZATION N/A 08/10/2015   Procedure: Thrombectomy;  Surgeon: Algernon Huxley, MD;  Location: [place] [facility];  Service: Cardiovascular;  Laterality: N/A;  . PERIPHERAL VASCULAR CATHETERIZATION N/A 08/10/2015   Procedure: A/V Shuntogram/Fistulagram;  Surgeon: Algernon Huxley, MD;  Location: [place] [facility];  Service: Cardiovascular;  Laterality: N/A;  .  PERIPHERAL VASCULAR CATHETERIZATION N/A 08/10/2015   Procedure: A/V Shunt Intervention;  Surgeon: Algernon Huxley, MD;  Location: [place] [facility];  Service: Cardiovascular;  Laterality: N/A;  . PERIPHERAL VASCULAR THROMBECTOMY Left 01/12/2019   Procedure: PERIPHERAL VASCULAR THROMBECTOMY;  Surgeon: Katha Cabal, MD;  Location: [place] [facility];  Service: Cardiovascular;  Laterality: Left;  . REMOVAL OF A DIALYSIS CATHETER Right 01/19/2019   Procedure: REMOVAL OF A DIALYSIS CATHETER;  Surgeon: Katha Cabal, MD;  Location: [facility];  Service: Vascular;  Laterality: Right;  . RENAL SHUNTS    . UPPER EXTREMITY VENOGRAPHY Right 03/20/2020   Procedure: UPPER EXTREMITY VENOGRAPHY;  Surgeon: Algernon Huxley, MD;  Location: [place] [facility];  Service: Cardiovascular;  Laterality: Right;  [name] VASCULAR ACCESS DEVICE INSERTION Right 11/24/2020   Procedure: INSERTION OF HERO VASCULAR ACCESS DEVICE (GRAFT);  Surgeon: Katha Cabal, MD;  Location: [facility];  Service: Vascular;  Laterality: Right;    Family History  Problem Relation Age of Onset  . Hypertension Sister     Allergies  Allergen Reactions  . Penicillins Hives, Itching and Swelling    Has patient had a PCN reaction causing immediate rash, facial/tongue/throat swelling,  SOB or lightheadedness with hypotension: No Has patient had a PCN reaction causing severe rash involving mucus membranes or skin necrosis: No Has patient had a PCN reaction that required hospitalization: No Has patient had a PCN reaction occurring within the last 10 years: No If all of the above answers are "NO", then may proceed with Cephalosporin use.  Penicillin allergy assessment completed on 03/22/20. Patient has received and tolerated cepholosporins incuding cefepime, ceftriaxone.   . Codeine Hives  . Codeine Sulfate Nausea Only  . Contrast Media [Iodinated Diagnostic Agents] Hives  . Gadobutrol Hives  . Gadolinium Derivatives Hives  . Metrizamide Hives  . Statins Other (See Comments)    muscle pain    CBC Latest Ref Rng & Units 12/02/2020 12/01/2020 11/30/2020  WBC 4.0 - 10.5 K/uL 9.6 8.4 10.1  Hemoglobin 12.0 - 15.0 g/dL 8.7(L) 9.5(L) 7.7(L)  Hematocrit 36.0 - 46.0 % 27.0(L) 28.9(L) 24.2(L)  Platelets 150 - 400 K/uL 203 217 241      CMP     Component Value Date/Time   NA 136 12/02/2020 0820   NA 135 (L) 03/29/2013 1409   K 4.0 12/02/2020 0820   K 4.5 03/29/2013 1409   CL 96 (L) 12/02/2020 0820   CL 97 (L) 03/29/2013 1409   CO2 25 12/02/2020 0820   CO2 27 03/29/2013 1409   GLUCOSE 108 (H) 12/02/2020 0820   GLUCOSE 97 03/29/2013 1409   BUN 31 (H) 12/02/2020 0820   BUN 48 (H) 03/29/2013 1409   CREATININE 6.81 (H) 12/02/2020 0820   CREATININE 12.06 (H) 03/29/2013 1409   CALCIUM 8.3 (L) 12/02/2020 0820   CALCIUM 8.8 03/29/2013 1409   PROT 7.1 11/25/2020 0929   ALBUMIN 3.8 11/25/2020 0929   AST 20 11/25/2020 0929   ALT 10 11/25/2020 0929   ALKPHOS 55 11/25/2020 0929   BILITOT 0.9 11/25/2020 0929   GFRNONAA 6 (L) 12/02/2020 0820   GFRNONAA 3 (L) 03/29/2013 1409   GFRAA 3 (L) 01/21/2019 0504   GFRAA 3 (L) 03/29/2013 1409     No results found.     Assessment & Plan:   1. ESRD (end stage renal disease) ([place]) Based on noninvasive studies today the patient has  a good flow volume in her new hero graft however it  is only been about 2 weeks since placement and so we will give an additional 2 weeks before we allow use.  However she does have symptoms that are concerning for steal syndrome.  Notably the feeling of cold sensation as well as the decreased functionality of her first 2 fingers.  After discussion with the patient, out of an abundance of caution we will proceed with a right upper extremity angiogram to evaluate for possible steal as well as to treat if noted.  I discussed the risk, benefits, and alternatives and the patient is agreeable to proceed.  We will contact the patient to schedule her procedure.  We will have her follow-up in office following.  2. Mixed hyperlipidemia Continue statin as ordered and reviewed, no changes at this time   3. Type 2 diabetes mellitus with vascular disease ([place]) Continue hypoglycemic medications as already ordered, these medications have been reviewed and there are no changes at this time.  Hgb A1C to be monitored as already arranged by primary service    Current Outpatient Medications on File Prior to Visit  Medication Sig Dispense Refill  . acetaminophen (TYLENOL) 500 MG tablet Take 500 mg by mouth every 6 (six) hours as needed for moderate pain or headache.    [name] aspirin 81 MG tablet Take 81 mg by mouth daily.    . cephALEXin (KEFLEX) 500 MG capsule Take 500 mg by mouth 2 (two) times daily.    . cetirizine (ZYRTEC) 10 MG tablet Take 10 mg by mouth daily.    . cinacalcet (SENSIPAR) 30 MG tablet Take 30 mg by mouth Every Tuesday,Thursday,and Saturday with dialysis.    [name] docusate sodium (COLACE) 100 MG capsule Take 100 mg by mouth daily.    [name] HYDROcodone-acetaminophen (NORCO) 5-325 MG tablet Take 1-2 tablets by mouth every 6 (six) hours as needed for moderate pain or severe pain. 40 tablet 0  . latanoprost (XALATAN) 0.005 % ophthalmic solution Place 1 drop into both eyes at bedtime.    . midodrine (PROAMATINE)  5 MG tablet Take 1 tablet (5 mg total) by mouth Every Tuesday,Thursday,and Saturday with dialysis. Please give 30 minutes prior to dialysis. Do NOT hold for dialysis. GIVE DOSE DURING DAYLIGHT HOURS ONLY    . pantoprazole (PROTONIX) 40 MG tablet Take 40 mg by mouth daily.    . pravastatin (PRAVACHOL) 20 MG tablet Take 20 mg by mouth daily.    . sevelamer carbonate (RENVELA) 800 MG tablet Take 1,600 mg by mouth 3 (three) times daily with meals.    . Timolol Maleate 0.5 % (DAILY) SOLN Place 1 drop into both eyes every morning.     No current facility-administered medications on file prior to visit.    There are no Patient Instructions on file for this visit. No follow-ups on file.   Kris Hartmann, NP

## 2020-12-13 NOTE — H&P (View-Only) (Signed)
Subjective:    Patient ID: Kelli Nguyen, female    DOB: 10/23/1945, 75 y.o.   MRN: [id number] Chief Complaint  Patient presents with  . Follow-up    2 wk [facility] post insertion of hero hda     Kelli Nguyen is a 75 year old female that returns today following placement of a right upper extremity hero graft on 11/24/2020.  The patient notes that she has had swelling in her arm as well as her hand following the procedure.  What is also concern to the patient is the feeling of a cold sensation and some numbness in her right hand.  She notes that she did have some slight numbness before her procedure but is worse now.  Also concern for the patient she is decreased function within her first 2 fingers.  She notes that this only happened following her procedure.  She denies any open wounds or ulcerations.  She denies any fevers or chills.  Noninvasive studies today show flow volume of 1355 with no evidence of significant stenosis in the new hero graft  .  Radial artery shows antegrade flows.   Review of Systems  Skin:  Positive for color change and wound.  All other systems reviewed and are negative.     Objective:   Physical Exam Vitals reviewed.  HENT:     Head: Normocephalic.  Cardiovascular:     Rate and Rhythm: Normal rate.     Pulses:          Radial pulses are 0 on the right side.  Pulmonary:     Effort: Pulmonary effort is normal.  Skin:    General: Skin is warm.  Neurological:     Mental Status: She is alert and oriented to person, place, and time.  Psychiatric:        Mood and Affect: Mood normal.        Behavior: Behavior normal.        Thought Content: Thought content normal.        Judgment: Judgment normal.    BP (!) 93/55   Pulse 83   Ht 4\' 11"  (1.499 m)   Wt 160 lb (72.6 kg)   BMI 32.32 kg/m   Past Medical History:  Diagnosis Date  . Anemia   . Carpal tunnel syndrome of left wrist   . Cataract, bilateral   . Chronic cough   . Closed displaced fracture  of left femoral neck ([place]) 03/09/2017  . Coronary artery disease    s/p 3v CABG (LIMA-LAD, SVG-OM1, SVG-PDA) in 2008  . Diabetes mellitus without complication ([place])   . ESRD (end stage renal disease) on dialysis ([place])    T-TH-SAT  . GERD (gastroesophageal reflux disease)   . Grade II diastolic dysfunction   . HLD (hyperlipidemia)   . Hypertension   . Murmur   . OA (osteoarthritis)   . PAD (peripheral artery disease) ([place])   . Pneumonia   . Polyneuropathy    feet and hands  . Pulmonary hypertension ([place])   . RBBB   . Secondary hyperparathyroidism of renal origin ([place])   . Sepsis ([place]) 06/28/2017  . Wears dentures    partial upper and lower    Social History   Socioeconomic History  . Marital status: Widowed    Spouse name: Not on file  . Number of children: 5  . Years of education: Not on file  . Highest education level: Not on file  Occupational History  . Not on  file  Tobacco Use  . Smoking status: Never  . Smokeless tobacco: Never  Vaping Use  . Vaping Use: Never used  Substance and Sexual Activity  . Alcohol use: No  . Drug use: No  . Sexual activity: Not on file  Other Topics Concern  . Not on file  Social History Narrative   Lives with son   Social Determinants of Health   Financial Resource Strain: Not on file  Food Insecurity: Not on file  Transportation Needs: Not on file  Physical Activity: Not on file  Stress: Not on file  Social Connections: Not on file  Intimate Partner Violence: Not on file    Past Surgical History:  Procedure Laterality Date  . A/V SHUNT INTERVENTION N/A 10/16/2016   Procedure: A/V Shunt Intervention;  Surgeon: Katha Cabal, MD;  Location: [place] [facility];  Service: Cardiovascular;  Laterality: N/A;  . A/V SHUNTOGRAM Left 10/16/2016   Procedure: A/V Shuntogram;  Surgeon: Katha Cabal, MD;  Location: [place] [facility];  Service: Cardiovascular;  Laterality: Left;  . ABDOMINAL HYSTERECTOMY    . CARDIAC  CATHETERIZATION N/A 02/26/2014  . CARPAL TUNNEL RELEASE Left 09/27/2015   Procedure: CARPAL TUNNEL RELEASE;  Surgeon: Leanor Kail, MD;  Location: [facility];  Service: Orthopedics;  Laterality: Left;  . CATARACT EXTRACTION W/PHACO Right 09/23/2016   Procedure: CATARACT EXTRACTION PHACO AND INTRAOCULAR LENS PLACEMENT (IOC) complicated diabetic right;  Surgeon: Ronnell Freshwater, MD;  Location: [place];  Service: Ophthalmology;  Laterality: Right;  Diabetic - diet controlled Potassium draw before surgery  . CATARACT EXTRACTION W/PHACO Left 01/20/2017   Procedure: CATARACT EXTRACTION PHACO AND INTRAOCULAR LENS PLACEMENT ([place]) LEFT DIABETIC;  Surgeon: Eulogio Bear, MD;  Location: [place];  Service: Ophthalmology;  Laterality: Left;  Diabetic - diet controlled Potassium draw prior to procedure  8:00 ARRIVAL  . COLONOSCOPY    . COLONOSCOPY WITH PROPOFOL N/A 11/28/2014   Procedure: COLONOSCOPY WITH PROPOFOL;  Surgeon: Manya Silvas, MD;  Location: [facility];  Service: Endoscopy;  Laterality: N/A;  . CORONARY ARTERY BYPASS GRAFT N/A 2008   3v; LIMA-LAD, SVG-OM1, SVG-PDA; Location: [facility]; Surgeon: Jannett Celestine, MD  . DIALYSIS/PERMA CATHETER INSERTION Left 01/21/2019   Procedure: DIALYSIS/PERMA CATHETER EXCHANGE;  Surgeon: Algernon Huxley, MD;  Location: [place] [facility];  Service: Cardiovascular;  Laterality: Left;  . HEMIARTHROPLASTY HIP Left 2018  . INSERTION OF DIALYSIS CATHETER Left 01/19/2019   Procedure: INSERTION OF DIALYSIS CATHETER;  Surgeon: Katha Cabal, MD;  Location: [facility];  Service: Vascular;  Laterality: Left;  . LIGATIONS OF HERO GRAFT Left 01/19/2019   Procedure: EXCISION LEFT HERO GRAFT;  Surgeon: Katha Cabal, MD;  Location: [facility];  Service: Vascular;  Laterality: Left;  . NEPHRECTOMY Left   . PERIPHERAL VASCULAR CATHETERIZATION Left 01/18/2015   Procedure: A/V Shuntogram/Fistulagram;  Surgeon: Katha Cabal, MD;   Location: [place] [facility];  Service: Cardiovascular;  Laterality: Left;  . PERIPHERAL VASCULAR CATHETERIZATION Left 01/18/2015   Procedure: A/V Shunt Intervention;  Surgeon: Katha Cabal, MD;  Location: [place] [facility];  Service: Cardiovascular;  Laterality: Left;  . PERIPHERAL VASCULAR CATHETERIZATION N/A 08/10/2015   Procedure: Thrombectomy;  Surgeon: Algernon Huxley, MD;  Location: [place] [facility];  Service: Cardiovascular;  Laterality: N/A;  . PERIPHERAL VASCULAR CATHETERIZATION N/A 08/10/2015   Procedure: A/V Shuntogram/Fistulagram;  Surgeon: Algernon Huxley, MD;  Location: [place] [facility];  Service: Cardiovascular;  Laterality: N/A;  .  PERIPHERAL VASCULAR CATHETERIZATION N/A 08/10/2015   Procedure: A/V Shunt Intervention;  Surgeon: Algernon Huxley, MD;  Location: [place] [facility];  Service: Cardiovascular;  Laterality: N/A;  . PERIPHERAL VASCULAR THROMBECTOMY Left 01/12/2019   Procedure: PERIPHERAL VASCULAR THROMBECTOMY;  Surgeon: Katha Cabal, MD;  Location: [place] [facility];  Service: Cardiovascular;  Laterality: Left;  . REMOVAL OF A DIALYSIS CATHETER Right 01/19/2019   Procedure: REMOVAL OF A DIALYSIS CATHETER;  Surgeon: Katha Cabal, MD;  Location: [facility];  Service: Vascular;  Laterality: Right;  . RENAL SHUNTS    . UPPER EXTREMITY VENOGRAPHY Right 03/20/2020   Procedure: UPPER EXTREMITY VENOGRAPHY;  Surgeon: Algernon Huxley, MD;  Location: [place] [facility];  Service: Cardiovascular;  Laterality: Right;  [name] VASCULAR ACCESS DEVICE INSERTION Right 11/24/2020   Procedure: INSERTION OF HERO VASCULAR ACCESS DEVICE (GRAFT);  Surgeon: Katha Cabal, MD;  Location: [facility];  Service: Vascular;  Laterality: Right;    Family History  Problem Relation Age of Onset  . Hypertension Sister     Allergies  Allergen Reactions  . Penicillins Hives, Itching and Swelling    Has patient had a PCN reaction causing immediate rash, facial/tongue/throat swelling,  SOB or lightheadedness with hypotension: No Has patient had a PCN reaction causing severe rash involving mucus membranes or skin necrosis: No Has patient had a PCN reaction that required hospitalization: No Has patient had a PCN reaction occurring within the last 10 years: No If all of the above answers are "NO", then may proceed with Cephalosporin use.  Penicillin allergy assessment completed on 03/22/20. Patient has received and tolerated cepholosporins incuding cefepime, ceftriaxone.   . Codeine Hives  . Codeine Sulfate Nausea Only  . Contrast Media [Iodinated Diagnostic Agents] Hives  . Gadobutrol Hives  . Gadolinium Derivatives Hives  . Metrizamide Hives  . Statins Other (See Comments)    muscle pain    CBC Latest Ref Rng & Units 12/02/2020 12/01/2020 11/30/2020  WBC 4.0 - 10.5 K/uL 9.6 8.4 10.1  Hemoglobin 12.0 - 15.0 g/dL 8.7(L) 9.5(L) 7.7(L)  Hematocrit 36.0 - 46.0 % 27.0(L) 28.9(L) 24.2(L)  Platelets 150 - 400 K/uL 203 217 241      CMP     Component Value Date/Time   NA 136 12/02/2020 0820   NA 135 (L) 03/29/2013 1409   K 4.0 12/02/2020 0820   K 4.5 03/29/2013 1409   CL 96 (L) 12/02/2020 0820   CL 97 (L) 03/29/2013 1409   CO2 25 12/02/2020 0820   CO2 27 03/29/2013 1409   GLUCOSE 108 (H) 12/02/2020 0820   GLUCOSE 97 03/29/2013 1409   BUN 31 (H) 12/02/2020 0820   BUN 48 (H) 03/29/2013 1409   CREATININE 6.81 (H) 12/02/2020 0820   CREATININE 12.06 (H) 03/29/2013 1409   CALCIUM 8.3 (L) 12/02/2020 0820   CALCIUM 8.8 03/29/2013 1409   PROT 7.1 11/25/2020 0929   ALBUMIN 3.8 11/25/2020 0929   AST 20 11/25/2020 0929   ALT 10 11/25/2020 0929   ALKPHOS 55 11/25/2020 0929   BILITOT 0.9 11/25/2020 0929   GFRNONAA 6 (L) 12/02/2020 0820   GFRNONAA 3 (L) 03/29/2013 1409   GFRAA 3 (L) 01/21/2019 0504   GFRAA 3 (L) 03/29/2013 1409     No results found.     Assessment & Plan:   1. ESRD (end stage renal disease) ([place]) Based on noninvasive studies today the patient has  a good flow volume in her new hero graft however it  is only been about 2 weeks since placement and so we will give an additional 2 weeks before we allow use.  However she does have symptoms that are concerning for steal syndrome.  Notably the feeling of cold sensation as well as the decreased functionality of her first 2 fingers.  After discussion with the patient, out of an abundance of caution we will proceed with a right upper extremity angiogram to evaluate for possible steal as well as to treat if noted.  I discussed the risk, benefits, and alternatives and the patient is agreeable to proceed.  We will contact the patient to schedule her procedure.  We will have her follow-up in office following.  2. Mixed hyperlipidemia Continue statin as ordered and reviewed, no changes at this time   3. Type 2 diabetes mellitus with vascular disease ([place]) Continue hypoglycemic medications as already ordered, these medications have been reviewed and there are no changes at this time.  Hgb A1C to be monitored as already arranged by primary service    Current Outpatient Medications on File Prior to Visit  Medication Sig Dispense Refill  . acetaminophen (TYLENOL) 500 MG tablet Take 500 mg by mouth every 6 (six) hours as needed for moderate pain or headache.    [name] aspirin 81 MG tablet Take 81 mg by mouth daily.    . cephALEXin (KEFLEX) 500 MG capsule Take 500 mg by mouth 2 (two) times daily.    . cetirizine (ZYRTEC) 10 MG tablet Take 10 mg by mouth daily.    . cinacalcet (SENSIPAR) 30 MG tablet Take 30 mg by mouth Every Tuesday,Thursday,and Saturday with dialysis.    [name] docusate sodium (COLACE) 100 MG capsule Take 100 mg by mouth daily.    [name] HYDROcodone-acetaminophen (NORCO) 5-325 MG tablet Take 1-2 tablets by mouth every 6 (six) hours as needed for moderate pain or severe pain. 40 tablet 0  . latanoprost (XALATAN) 0.005 % ophthalmic solution Place 1 drop into both eyes at bedtime.    . midodrine (PROAMATINE)  5 MG tablet Take 1 tablet (5 mg total) by mouth Every Tuesday,Thursday,and Saturday with dialysis. Please give 30 minutes prior to dialysis. Do NOT hold for dialysis. GIVE DOSE DURING DAYLIGHT HOURS ONLY    . pantoprazole (PROTONIX) 40 MG tablet Take 40 mg by mouth daily.    . pravastatin (PRAVACHOL) 20 MG tablet Take 20 mg by mouth daily.    . sevelamer carbonate (RENVELA) 800 MG tablet Take 1,600 mg by mouth 3 (three) times daily with meals.    . Timolol Maleate 0.5 % (DAILY) SOLN Place 1 drop into both eyes every morning.     No current facility-administered medications on file prior to visit.    There are no Patient Instructions on file for this visit. No follow-ups on file.   Kris Hartmann, NP

## 2020-12-15 ENCOUNTER — Telehealth (INDEPENDENT_AMBULATORY_CARE_PROVIDER_SITE_OTHER): Payer: Self-pay | Admitting: Nurse Practitioner

## 2020-12-15 ENCOUNTER — Encounter (INDEPENDENT_AMBULATORY_CARE_PROVIDER_SITE_OTHER): Payer: Self-pay

## 2020-12-15 NOTE — Telephone Encounter (Signed)
Called stating she was returning a phone call from a nurse that was calling to get her scheduled for procedure. Kelli Nguyen angio). I made her aware that someone will call her back at their earliest convenience.

## 2020-12-15 NOTE — Telephone Encounter (Signed)
Patient is schedule for right upper extremity angio with Dr Delana Meyer on 12/20/20 arrival time 6:45 am at the [facility]. Pre procedure instructions were gone over with the patient.

## 2020-12-19 ENCOUNTER — Encounter (INDEPENDENT_AMBULATORY_CARE_PROVIDER_SITE_OTHER): Payer: Self-pay | Admitting: Nurse Practitioner

## 2020-12-19 ENCOUNTER — Other Ambulatory Visit (INDEPENDENT_AMBULATORY_CARE_PROVIDER_SITE_OTHER): Payer: Self-pay | Admitting: Nurse Practitioner

## 2020-12-20 ENCOUNTER — Encounter
Admission: RE | Disposition: A | Discharge status: Home / Self Care | Payer: Self-pay | Source: Home / Self Care | Attending: Vascular Surgery

## 2020-12-20 ENCOUNTER — Ambulatory Visit
Admission: RE | Admit: 2020-12-20 | Discharge: 2020-12-20 | Disposition: A | Discharge status: Home / Self Care | Payer: Medicare Other | Attending: Vascular Surgery | Admitting: Vascular Surgery

## 2020-12-20 ENCOUNTER — Other Ambulatory Visit: Payer: Self-pay

## 2020-12-20 DIAGNOSIS — Z992 Dependence on renal dialysis: Secondary | ICD-10-CM | POA: Insufficient documentation

## 2020-12-20 DIAGNOSIS — Z7982 Long term (current) use of aspirin: Secondary | ICD-10-CM | POA: Diagnosis not present

## 2020-12-20 DIAGNOSIS — E1122 Type 2 diabetes mellitus with diabetic chronic kidney disease: Secondary | ICD-10-CM | POA: Diagnosis not present

## 2020-12-20 DIAGNOSIS — E782 Mixed hyperlipidemia: Secondary | ICD-10-CM | POA: Insufficient documentation

## 2020-12-20 DIAGNOSIS — M79641 Pain in right hand: Secondary | ICD-10-CM | POA: Diagnosis not present

## 2020-12-20 DIAGNOSIS — T82898A Other specified complication of vascular prosthetic devices, implants and grafts, initial encounter: Secondary | ICD-10-CM | POA: Insufficient documentation

## 2020-12-20 DIAGNOSIS — Z79899 Other long term (current) drug therapy: Secondary | ICD-10-CM | POA: Diagnosis not present

## 2020-12-20 DIAGNOSIS — I70221 Atherosclerosis of native arteries of extremities with rest pain, right leg: Secondary | ICD-10-CM | POA: Diagnosis not present

## 2020-12-20 DIAGNOSIS — I120 Hypertensive chronic kidney disease with stage 5 chronic kidney disease or end stage renal disease: Secondary | ICD-10-CM | POA: Diagnosis not present

## 2020-12-20 DIAGNOSIS — N186 End stage renal disease: Secondary | ICD-10-CM | POA: Diagnosis not present

## 2020-12-20 DIAGNOSIS — Z91041 Radiographic dye allergy status: Secondary | ICD-10-CM | POA: Insufficient documentation

## 2020-12-20 DIAGNOSIS — Z888 Allergy status to other drugs, medicaments and biological substances status: Secondary | ICD-10-CM | POA: Diagnosis not present

## 2020-12-20 DIAGNOSIS — I7789 Other specified disorders of arteries and arterioles: Secondary | ICD-10-CM

## 2020-12-20 DIAGNOSIS — Z880 Allergy status to penicillin: Secondary | ICD-10-CM | POA: Insufficient documentation

## 2020-12-20 DIAGNOSIS — Y841 Kidney dialysis as the cause of abnormal reaction of the patient, or of later complication, without mention of misadventure at the time of the procedure: Secondary | ICD-10-CM | POA: Diagnosis not present

## 2020-12-20 DIAGNOSIS — Z885 Allergy status to narcotic agent status: Secondary | ICD-10-CM | POA: Insufficient documentation

## 2020-12-20 HISTORY — PX: UPPER EXTREMITY ANGIOGRAPHY: CATH118270

## 2020-12-20 LAB — POTASSIUM (ARMC VASCULAR LAB ONLY): Potassium (ARMC vascular lab): 3.4 — ABNORMAL LOW (ref 3.5–5.1)

## 2020-12-20 LAB — GLUCOSE, CAPILLARY: Glucose-Capillary: 98 mg/dL (ref 70–99)

## 2020-12-20 SURGERY — UPPER EXTREMITY ANGIOGRAPHY
Anesthesia: Moderate Sedation | Laterality: Right

## 2020-12-20 MED ORDER — DIPHENHYDRAMINE HCL 50 MG/ML IJ SOLN
50.0000 mg | Freq: Once | INTRAMUSCULAR | Status: AC | PRN
  Administered 2020-12-20: 50 mg via INTRAVENOUS

## 2020-12-20 MED ORDER — CLOPIDOGREL BISULFATE 75 MG PO TABS: 75.0000 mg | ORAL_TABLET | Freq: Every day | ORAL | 4 refills | Status: DC

## 2020-12-20 MED ORDER — FAMOTIDINE 20 MG PO TABS
ORAL_TABLET | ORAL | Status: AC
  Filled 2020-12-20: qty 1

## 2020-12-20 MED ORDER — METHYLPREDNISOLONE SODIUM SUCC 125 MG IJ SOLR
125.0000 mg | Freq: Once | INTRAMUSCULAR | Status: AC | PRN
  Administered 2020-12-20: 125 mg via INTRAVENOUS

## 2020-12-20 MED ORDER — DIPHENHYDRAMINE HCL 50 MG/ML IJ SOLN
INTRAMUSCULAR | Status: AC
  Filled 2020-12-20: qty 1

## 2020-12-20 MED ORDER — FENTANYL CITRATE (PF) 100 MCG/2ML IJ SOLN: 12.5000 ug | Freq: Once | INTRAMUSCULAR | Status: DC | PRN

## 2020-12-20 MED ORDER — FAMOTIDINE 20 MG PO TABS
ORAL_TABLET | ORAL | Status: AC
  Filled 2020-12-20: qty 2

## 2020-12-20 MED ORDER — SODIUM CHLORIDE 0.9 % IV SOLN: INTRAVENOUS | Status: DC

## 2020-12-20 MED ORDER — MIDAZOLAM HCL 5 MG/5ML IJ SOLN
INTRAMUSCULAR | Status: AC
  Filled 2020-12-20: qty 5

## 2020-12-20 MED ORDER — SODIUM CHLORIDE 0.9 % IV BOLUS
INTRAVENOUS | Status: AC | PRN
  Administered 2020-12-20: 500 mL/h via INTRAVENOUS

## 2020-12-20 MED ORDER — CLINDAMYCIN PHOSPHATE 300 MG/50ML IV SOLN
INTRAVENOUS | Status: AC
  Administered 2020-12-20: 300 mg via INTRAVENOUS
  Filled 2020-12-20: qty 50

## 2020-12-20 MED ORDER — FENTANYL CITRATE (PF) 100 MCG/2ML IJ SOLN
INTRAMUSCULAR | Status: AC
  Filled 2020-12-20: qty 2

## 2020-12-20 MED ORDER — HEPARIN SODIUM (PORCINE) 1000 UNIT/ML IJ SOLN
INTRAMUSCULAR | Status: DC | PRN
  Administered 2020-12-20: 5000 [IU] via INTRAVENOUS

## 2020-12-20 MED ORDER — MIDAZOLAM HCL 2 MG/2ML IJ SOLN
INTRAMUSCULAR | Status: DC | PRN
  Administered 2020-12-20: 0.5 mg via INTRAVENOUS
  Administered 2020-12-20: 1 mg via INTRAVENOUS
  Administered 2020-12-20: 0.5 mg via INTRAVENOUS
  Administered 2020-12-20: 1 mg via INTRAVENOUS

## 2020-12-20 MED ORDER — NITROGLYCERIN 1 MG/10 ML FOR IR/CATH LAB
INTRA_ARTERIAL | Status: DC | PRN
  Administered 2020-12-20: 250 ug

## 2020-12-20 MED ORDER — NITROGLYCERIN 1 MG/10 ML FOR IR/CATH LAB
INTRA_ARTERIAL | Status: AC
  Filled 2020-12-20: qty 10

## 2020-12-20 MED ORDER — MIDAZOLAM HCL 2 MG/ML PO SYRP: 8.0000 mg | ORAL_SOLUTION | Freq: Once | ORAL | Status: DC | PRN

## 2020-12-20 MED ORDER — METHYLPREDNISOLONE SODIUM SUCC 125 MG IJ SOLR
INTRAMUSCULAR | Status: AC
  Filled 2020-12-20: qty 2

## 2020-12-20 MED ORDER — MORPHINE SULFATE (PF) 4 MG/ML IV SOLN
2.0000 mg | INTRAVENOUS | Status: DC | PRN
Start: 2020-12-20 — End: 2020-12-20

## 2020-12-20 MED ORDER — CLINDAMYCIN PHOSPHATE 300 MG/50ML IV SOLN: 300.0000 mg | Freq: Once | INTRAVENOUS | Status: AC

## 2020-12-20 MED ORDER — FAMOTIDINE 20 MG PO TABS
40.0000 mg | ORAL_TABLET | Freq: Once | ORAL | Status: AC | PRN
  Administered 2020-12-20: 40 mg via ORAL

## 2020-12-20 MED ORDER — HEPARIN SODIUM (PORCINE) 1000 UNIT/ML IJ SOLN
INTRAMUSCULAR | Status: AC
  Filled 2020-12-20: qty 1

## 2020-12-20 MED ORDER — CLOPIDOGREL BISULFATE 300 MG PO TABS: 300.0000 mg | ORAL_TABLET | ORAL | Status: DC

## 2020-12-20 MED ORDER — FENTANYL CITRATE (PF) 100 MCG/2ML IJ SOLN
INTRAMUSCULAR | Status: DC | PRN
  Administered 2020-12-20: 25 ug via INTRAVENOUS
  Administered 2020-12-20 (×2): 50 ug via INTRAVENOUS
  Administered 2020-12-20: 25 ug via INTRAVENOUS

## 2020-12-20 MED ORDER — ONDANSETRON HCL 4 MG/2ML IJ SOLN: 4.0000 mg | Freq: Four times a day (QID) | INTRAMUSCULAR | Status: DC | PRN

## 2020-12-20 SURGICAL SUPPLY — 24 items
BALLN ARMADA 2.0X80X150 (BALLOONS) ×2
BALLN ULTRVRSE 2X150X150 (BALLOONS) ×2
BALLOON ARMADA 2.0X80X150 (BALLOONS) ×1 IMPLANT
BALLOON ULTRVRSE 2X150X150 (BALLOONS) ×1 IMPLANT
CATH ANGIO 5F PIGTAIL 100CM (CATHETERS) ×2 IMPLANT
CATH BEACON 5 .035 100 H1 TIP (CATHETERS) ×2 IMPLANT
CATH SEEKER .018X150 (CATHETERS) ×2 IMPLANT
CATH SEEKER .035X135CM (CATHETERS) ×2 IMPLANT
COVER PROBE U/S 5X48 (MISCELLANEOUS) ×2 IMPLANT
DEVICE STARCLOSE SE CLOSURE (Vascular Products) ×2 IMPLANT
DEVICE TORQUE .025-.038 (MISCELLANEOUS) ×2 IMPLANT
GLIDEWIRE ADV .014X300CM (WIRE) ×2 IMPLANT
GLIDEWIRE ANGLED SS 035X260CM (WIRE) ×2 IMPLANT
KIT ENCORE 26 ADVANTAGE (KITS) ×2 IMPLANT
NEEDLE ENTRY 21GA 7CM ECHOTIP (NEEDLE) ×2 IMPLANT
PACK ANGIOGRAPHY (CUSTOM PROCEDURE TRAY) ×2 IMPLANT
SET INTRO CAPELLA COAXIAL (SET/KITS/TRAYS/PACK) ×2 IMPLANT
SHEATH BRITE TIP 5FRX11 (SHEATH) ×2 IMPLANT
SHEATH SHUTTLE SELECT 6F (SHEATH) ×2 IMPLANT
SYR MEDRAD MARK 7 150ML (SYRINGE) ×2 IMPLANT
TUBING CONTRAST HIGH PRESS 72 (TUBING) ×2 IMPLANT
VALVE CHECKFLO PERFORMER (SHEATH) ×2 IMPLANT
WIRE GUIDERIGHT .035X150 (WIRE) ×2 IMPLANT
WIRE HI TORQ VERSACORE 300 (WIRE) ×2 IMPLANT

## 2020-12-20 NOTE — Interval H&P Note (Signed)
History and Physical Interval Note:  12/20/2020 7:56 AM  Kelli Nguyen  has presented today for surgery, with the diagnosis of RUE Angio   Steal syndrome.  The various methods of treatment have been discussed with the patient and family. After consideration of risks, benefits and other options for treatment, the patient has consented to  Procedure(s): UPPER EXTREMITY ANGIOGRAPHY (Right) as a surgical intervention.  The patient's history has been reviewed, patient examined, no change in status, stable for surgery.  I have reviewed the patient's chart and labs.  Questions were answered to the patient's satisfaction.     Hortencia Pilar

## 2020-12-20 NOTE — Op Note (Signed)
[facility]  Percutaneous Study/Intervention Procedural Note   Date of Surgery: 12/20/2020,11:25 AM  Surgeon: Hortencia Pilar  Pre-operative Diagnosis: Right arm steal syndrome secondary to AV graft; atherosclerotic occlusive disease bilateral upper extremities with rest pain of the right hand; end-stage renal disease requiring hemodialysis  Post-operative diagnosis:  Same  Procedure(s) Performed:  1.  Arch aortogram  2.  Right upper extremity angiography third order catheter placement with additional third order  3.  Percutaneous transluminal angioplasty to 2 mm right ulnar artery  4.  Percutaneous transluminal angioplasty to 2 mm right radial artery  5.  Administration of intra-arterial nitroglycerin right upper extremity 300 mcg total dose  6.  StarClose right common femoral artery    Anesthesia: Conscious sedation was administered by the interventional radiology RN under my direct supervision. IV Versed plus fentanyl were utilized. Continuous ECG, pulse oximetry and blood pressure was monitored throughout the entire procedure.  Conscious sedation was administered for a total of 1 hour 11 minutes 49 seconds.  Sheath: 6 French 90 cm shuttle sheath right common femoral retrograde  Contrast: 70 cc   Fluoroscopy Time: 13.9 minutes  Indications: [name] presented to the office with recently having had a right arm hero graft created.  She is now experiencing increased pain in her fingers particularly the third fourth and fifth fingers of the right hand.  Physical examination as well as noninvasive studies suggest significant atherosclerotic occlusive disease of the distal arm and she is undergoing angiography with the hope for intervention so that we may preserve her dialysis access and correct her steal syndrome.  Risks and benefits of been reviewed all questions were answered patient agrees to proceed  Procedure:  Kelli Nguyen is a 75 y.o. female who was  identified and appropriate procedural time out was performed.  The patient was then placed supine on the table and prepped and draped in the usual sterile fashion.    Ultrasound was used to evaluate the right common femoral artery.  It is echolucent and pulsatile indicating it is patent .   A micropuncture needle was used to access the right common femoral artery under direct ultrasound guidance and an image was recorded for the permanent record.  A microwire was then advanced under fluoroscopic guidance followed by the micro-sheath.  A 0.035 J wire was advanced without resistance and a 5Fr sheath was placed.    Pigtail catheter was then advanced to the level of ascending aorta and and LAO projection of the aortic arch was obtained. The pigtail catheter was exchanged for a H1 catheter. The innominate artery was then selected and the catheter and wire advanced. Hand injection of contrast was then used to create images of the subclavian axillary and brachial arteries was obtained.  Diagnostic interpretation: The aortic arch is opacified with a bolus injection contrast.  It is a type II to type III arch.  There is mild atherosclerotic changes diffusely but there are no large ulcerations noted within the thoracic aorta.  The origins of the great vessels are widely patent.  Of note it is also a bovine arch.  The and Ondemet subclavian axillary and brachial arteries are widely patent.  The hero graft is widely patent and the arterial anastomosis of the hero graft is widely patent.  By injection at the level of the mid brachial artery there is minimal contrast filling the trifurcation in the distal vessels confirming steal syndrome.  Upon selecting the distal brachial and then individually selecting  the radial and the ulnar the following findings were identified: The radial and ulnar arteries are occluded from near their origins down to the level of the wrist.  There is reconstitution of the radial artery just  proximal to the hand there is reconstitution of the ulnar artery at the level of the carpal bones.  The palmar arch is intact and there are digital vessels to each finger.  The interosseous is actually quite large but does not cross the wrist directly and does not yield particularly robust collaterals to fill the hand.  4000 units of heparin was then given and the 5 French sheath was exchanged for a 6 French 90 cm shuttle sheath.  Versa core and catheter were then negotiated distally. And the versa core was exchanged for a 014 advantage wire and the catheter was exchanged for a 150 cm 018 seeker catheter.  The lesion identified in the ulnar artery was then crossed with the advantage wire and the seeker catheter and then treated with a 2 mm x 200 mm Ultraverse balloon.  Inflation was to 12 atm for 1 minute follow-up imaging demonstrated improvement in this lesion with less than 15% residual stenosis.   The catheter was then reintroduced over the wire and negotiated into the radial artery. Selective injection of the radial artery was then performed. This showed occlusion throughout its midportion but reconstitution near the wrist.  The wire was negotiated into the distal radial artery across the stenosis and a 2 mm Ultraversed balloon was used to treat the stenosis.  3 separate inflations were required all of them were to 10 to 12 atm for 1 minute  After successful treatment of the radial the catheter was pulled back the distal brachial artery.  Hand-injection of contrast demonstrated significant spasm and 300 mcg of nitroglycerin were infused.  The seeker catheter was then advanced into the ulnar and selective imaging was performed this demonstrated patency in the ulnar artery with less than 50% residual stenosis throughout its course the catheter was then negotiated into the radial artery where again selective and imaging was performed and there was less than 15% residual stenosis.  After review of the  images the catheter was removed over wire and an right view of the groin was obtained. StarClose device was deployed without difficulty.  Findings:    The aortic arch is opacified with a bolus injection contrast.  It is a type II to type III arch.  There is mild atherosclerotic changes diffusely but there are no large ulcerations noted within the thoracic aorta.  The origins of the great vessels are widely patent.  Of note it is also a bovine arch.  The and Ondemet subclavian axillary and brachial arteries are widely patent.  The hero graft is widely patent and the arterial anastomosis of the hero graft is widely patent.  By injection at the level of the mid brachial artery there is minimal contrast filling the trifurcation in the distal vessels confirming steal syndrome.  Upon selecting the distal brachial and then individually selecting the radial and the ulnar the following findings were identified: The radial and ulnar arteries are occluded from near their origins down to the level of the wrist.  There is reconstitution of the radial artery just proximal to the hand there is reconstitution of the ulnar artery at the level of the carpal bones.  The palmar arch is intact and there are digital vessels to each finger.  The interosseous is actually quite large but does  not cross the wrist directly and does not yield particularly robust collaterals to fill the hand.  Following angioplasty first of the ulnar artery and then the radial artery there is now patency of both where before there was occlusion and there is less than 15% residual stenosis located in both arteries.  Disposition: Patient was taken to the recovery room in stable condition having tolerated the procedure well.  Belenda Cruise Jeron Grahn 12/20/2020,11:25 AM

## 2020-12-21 ENCOUNTER — Encounter: Payer: Self-pay | Admitting: Vascular Surgery

## 2020-12-25 ENCOUNTER — Encounter: Payer: Self-pay | Admitting: Vascular Surgery

## 2021-01-01 ENCOUNTER — Ambulatory Visit (INDEPENDENT_AMBULATORY_CARE_PROVIDER_SITE_OTHER): Payer: Medicare Other | Admitting: Vascular Surgery

## 2021-01-04 ENCOUNTER — Ambulatory Visit (INDEPENDENT_AMBULATORY_CARE_PROVIDER_SITE_OTHER): Payer: Medicare Other | Admitting: Vascular Surgery

## 2021-01-09 ENCOUNTER — Other Ambulatory Visit: Payer: Self-pay

## 2021-01-09 ENCOUNTER — Encounter (INDEPENDENT_AMBULATORY_CARE_PROVIDER_SITE_OTHER): Payer: Self-pay | Admitting: Nurse Practitioner

## 2021-01-09 ENCOUNTER — Ambulatory Visit (INDEPENDENT_AMBULATORY_CARE_PROVIDER_SITE_OTHER): Payer: Medicare Other | Admitting: Nurse Practitioner

## 2021-01-09 VITALS — BP 133/72 | HR 93 | Resp 16 | Wt 159.4 lb

## 2021-01-09 DIAGNOSIS — E1159 Type 2 diabetes mellitus with other circulatory complications: Secondary | ICD-10-CM

## 2021-01-09 DIAGNOSIS — N186 End stage renal disease: Secondary | ICD-10-CM

## 2021-01-09 DIAGNOSIS — E782 Mixed hyperlipidemia: Secondary | ICD-10-CM

## 2021-01-15 ENCOUNTER — Encounter (INDEPENDENT_AMBULATORY_CARE_PROVIDER_SITE_OTHER): Payer: Self-pay | Admitting: Nurse Practitioner

## 2021-01-15 NOTE — Progress Notes (Signed)
Subjective:    Patient ID: Elyn Peers, female    DOB: [birth year], 75 y.o.   MRN: [id number] Chief Complaint  Patient presents with  . Follow-up    [facility] 1 wk post ue angio    Shanin Szymanowski is a 75 year old female that returns today for evaluation of steal syndromes on her right upper extremity hero graft.  The graft was placed on 11/24/2020.  Initially she noted that she had swelling in her arm as well as her hand following the procedure.  She also felt a cold sensation and numbness in that right hand.  She also noted that she had difficulty with moving her first 2 fingers.  There were no wounds or ulcerations.  Following intervention on 12/20/2020 including:  Procedure(s) Performed:             1.  Arch aortogram             2.  Right upper extremity angiography third order catheter placement with additional third order             3.  Percutaneous transluminal angioplasty to 2 mm right ulnar artery             4.  Percutaneous transluminal angioplasty to 2 mm right radial artery             5.  Administration of intra-arterial nitroglycerin right upper extremity 300 mcg total dose             6.  [employer] right common femoral artery  She notes that the cold sensation has resolved.  She still has some numbness but the movement of her first 2 fingers is improved as well.  She still continues to deny having any new wound formation.  She denies any fevers or chills either.   Review of Systems  Musculoskeletal:  Positive for myalgias.  Skin:  Positive for color change. Negative for wound.      Objective:   Physical Exam Vitals reviewed.  HENT:     Head: Normocephalic.  Cardiovascular:     Rate and Rhythm: Normal rate.     Pulses:          Radial pulses are 1+ on the right side.     Arteriovenous access: Right arteriovenous access is present.    Comments: Good thrill and bruit Pulmonary:     Effort: Pulmonary effort is normal.  Skin:    General: Skin is warm and dry.   Neurological:     Mental Status: She is alert and oriented to person, place, and time.     Motor: Weakness present.  Psychiatric:        Mood and Affect: Mood normal.        Behavior: Behavior normal.        Thought Content: Thought content normal.        Judgment: Judgment normal.    BP 133/72 (BP Location: Left Arm)   Pulse 93   Resp 16   Wt 159 lb 6.4 oz (72.3 kg)   BMI 32.19 kg/m   Past Medical History:  Diagnosis Date  . Anemia   . Carpal tunnel syndrome of left wrist   . Cataract, bilateral   . Chronic cough   . Closed displaced fracture of left femoral neck ([place]) 03/09/2017  . Coronary artery disease    s/p 3v CABG (LIMA-LAD, SVG-OM1, SVG-PDA) in 2008  . Diabetes mellitus without complication ([place])   . ESRD (end stage renal  disease) on dialysis ([place])    T-TH-SAT  . GERD (gastroesophageal reflux disease)   . Grade II diastolic dysfunction   . HLD (hyperlipidemia)   . Hypertension   . Murmur   . OA (osteoarthritis)   . PAD (peripheral artery disease) ([place])   . Pneumonia   . Polyneuropathy    feet and hands  . Pulmonary hypertension ([place])   . RBBB   . Secondary hyperparathyroidism of renal origin ([place])   . Sepsis ([place]) 06/28/2017  . Wears dentures    partial upper and lower    Social History   Socioeconomic History  . Marital status: Widowed    Spouse name: Not on file  . Number of children: 5  . Years of education: Not on file  . Highest education level: Not on file  Occupational History  . Not on file  Tobacco Use  . Smoking status: Never  . Smokeless tobacco: Never  Vaping Use  . Vaping Use: Never used  Substance and Sexual Activity  . Alcohol use: No  . Drug use: No  . Sexual activity: Not on file  Other Topics Concern  . Not on file  Social History Narrative   Lives with son   Social Determinants of Health   Financial Resource Strain: Not on file  Food Insecurity: Not on file  Transportation Needs: Not on file  Physical Activity:  Not on file  Stress: Not on file  Social Connections: Not on file  Intimate Partner Violence: Not on file    Past Surgical History:  Procedure Laterality Date  . A/V SHUNT INTERVENTION N/A 10/16/2016   Procedure: A/V Shunt Intervention;  Surgeon: Katha Cabal, MD;  Location: [place] [facility];  Service: Cardiovascular;  Laterality: N/A;  . A/V SHUNTOGRAM Left 10/16/2016   Procedure: A/V Shuntogram;  Surgeon: Katha Cabal, MD;  Location: [place] [facility];  Service: Cardiovascular;  Laterality: Left;  . ABDOMINAL HYSTERECTOMY    . CARDIAC CATHETERIZATION N/A 02/26/2014  . CARPAL TUNNEL RELEASE Left 09/27/2015   Procedure: CARPAL TUNNEL RELEASE;  Surgeon: Leanor Kail, MD;  Location: [facility];  Service: Orthopedics;  Laterality: Left;  . CATARACT EXTRACTION W/PHACO Right 09/23/2016   Procedure: CATARACT EXTRACTION PHACO AND INTRAOCULAR LENS PLACEMENT (IOC) complicated diabetic right;  Surgeon: Ronnell Freshwater, MD;  Location: [facility];  Service: Ophthalmology;  Laterality: Right;  Diabetic - diet controlled Potassium draw before surgery  . CATARACT EXTRACTION W/PHACO Left 01/20/2017   Procedure: CATARACT EXTRACTION PHACO AND INTRAOCULAR LENS PLACEMENT ([place]) LEFT DIABETIC;  Surgeon: Eulogio Bear, MD;  Location: [facility];  Service: Ophthalmology;  Laterality: Left;  Diabetic - diet controlled Potassium draw prior to procedure  8:00 ARRIVAL  . COLONOSCOPY    . COLONOSCOPY WITH PROPOFOL N/A 11/28/2014   Procedure: COLONOSCOPY WITH PROPOFOL;  Surgeon: Manya Silvas, MD;  Location: [facility];  Service: Endoscopy;  Laterality: N/A;  . CORONARY ARTERY BYPASS GRAFT N/A 2008   3v; LIMA-LAD, SVG-OM1, SVG-PDA; Location: [facility]; Surgeon: Jannett Celestine, MD  . DIALYSIS/PERMA CATHETER INSERTION Left 01/21/2019   Procedure: DIALYSIS/PERMA CATHETER EXCHANGE;  Surgeon: Algernon Huxley, MD;  Location: [facility] [place] [facility];  Service: Cardiovascular;   Laterality: Left;  . HEMIARTHROPLASTY HIP Left 2018  . INSERTION OF DIALYSIS CATHETER Left 01/19/2019   Procedure: INSERTION OF DIALYSIS CATHETER;  Surgeon: Katha Cabal, MD;  Location: [facility];  Service: Vascular;  Laterality: Left;  . LIGATIONS OF HERO GRAFT Left 01/19/2019  Procedure: EXCISION LEFT HERO GRAFT;  Surgeon: Katha Cabal, MD;  Location: [facility];  Service: Vascular;  Laterality: Left;  . NEPHRECTOMY Left   . PERIPHERAL VASCULAR CATHETERIZATION Left 01/18/2015   Procedure: A/V Shuntogram/Fistulagram;  Surgeon: Katha Cabal, MD;  Location: [place] [facility];  Service: Cardiovascular;  Laterality: Left;  . PERIPHERAL VASCULAR CATHETERIZATION Left 01/18/2015   Procedure: A/V Shunt Intervention;  Surgeon: Katha Cabal, MD;  Location: [place] [facility];  Service: Cardiovascular;  Laterality: Left;  . PERIPHERAL VASCULAR CATHETERIZATION N/A 08/10/2015   Procedure: Thrombectomy;  Surgeon: Algernon Huxley, MD;  Location: [place] [facility];  Service: Cardiovascular;  Laterality: N/A;  . PERIPHERAL VASCULAR CATHETERIZATION N/A 08/10/2015   Procedure: A/V Shuntogram/Fistulagram;  Surgeon: Algernon Huxley, MD;  Location: [place] [facility];  Service: Cardiovascular;  Laterality: N/A;  . PERIPHERAL VASCULAR CATHETERIZATION N/A 08/10/2015   Procedure: A/V Shunt Intervention;  Surgeon: Algernon Huxley, MD;  Location: [place] [facility];  Service: Cardiovascular;  Laterality: N/A;  . PERIPHERAL VASCULAR THROMBECTOMY Left 01/12/2019   Procedure: PERIPHERAL VASCULAR THROMBECTOMY;  Surgeon: Katha Cabal, MD;  Location: [place] [facility];  Service: Cardiovascular;  Laterality: Left;  . REMOVAL OF A DIALYSIS CATHETER Right 01/19/2019   Procedure: REMOVAL OF A DIALYSIS CATHETER;  Surgeon: Katha Cabal, MD;  Location: [facility];  Service: Vascular;  Laterality: Right;  . RENAL SHUNTS    . UPPER EXTREMITY ANGIOGRAPHY Right 12/20/2020   Procedure: UPPER EXTREMITY  ANGIOGRAPHY;  Surgeon: Katha Cabal, MD;  Location: [facility] [place] [facility];  Service: Cardiovascular;  Laterality: Right;  . UPPER EXTREMITY VENOGRAPHY Right 03/20/2020   Procedure: UPPER EXTREMITY VENOGRAPHY;  Surgeon: Algernon Huxley, MD;  Location: [place] [facility];  Service: Cardiovascular;  Laterality: Right;  [name] VASCULAR ACCESS DEVICE INSERTION Right 11/24/2020   Procedure: INSERTION OF HERO VASCULAR ACCESS DEVICE (GRAFT);  Surgeon: Katha Cabal, MD;  Location: [facility];  Service: Vascular;  Laterality: Right;    Family History  Problem Relation Age of Onset  . Hypertension Sister     Allergies  Allergen Reactions  . Penicillins Hives, Itching and Swelling    Has patient had a PCN reaction causing immediate rash, facial/tongue/throat swelling, SOB or lightheadedness with hypotension: No Has patient had a PCN reaction causing severe rash involving mucus membranes or skin necrosis: No Has patient had a PCN reaction that required hospitalization: No Has patient had a PCN reaction occurring within the last 10 years: No If all of the above answers are "NO", then may proceed with Cephalosporin use.  Penicillin allergy assessment completed on 03/22/20. Patient has received and tolerated cepholosporins incuding cefepime, ceftriaxone.   . Codeine Hives  . Codeine Sulfate Nausea Only  . Contrast Media [Iodinated Diagnostic Agents] Hives  . Gadobutrol Hives  . Gadolinium Derivatives Hives  . Metrizamide Hives  . Statins Other (See Comments)    muscle pain    CBC Latest Ref Rng & Units 12/02/2020 12/01/2020 11/30/2020  WBC 4.0 - 10.5 K/uL 9.6 8.4 10.1  Hemoglobin 12.0 - 15.0 g/dL 8.7(L) 9.5(L) 7.7(L)  Hematocrit 36.0 - 46.0 % 27.0(L) 28.9(L) 24.2(L)  Platelets 150 - 400 K/uL 203 217 241      CMP     Component Value Date/Time   NA 136 12/02/2020 0820   NA 135 (L) 03/29/2013 1409   K 4.0 12/02/2020 0820   K 4.5 03/29/2013 1409   CL 96 (L) 12/02/2020 0820  CL 97 (L)  03/29/2013 1409   CO2 25 12/02/2020 0820   CO2 27 03/29/2013 1409   GLUCOSE 108 (H) 12/02/2020 0820   GLUCOSE 97 03/29/2013 1409   BUN 31 (H) 12/02/2020 0820   BUN 48 (H) 03/29/2013 1409   CREATININE 6.81 (H) 12/02/2020 0820   CREATININE 12.06 (H) 03/29/2013 1409   CALCIUM 8.3 (L) 12/02/2020 0820   CALCIUM 8.8 03/29/2013 1409   PROT 7.1 11/25/2020 0929   ALBUMIN 3.8 11/25/2020 0929   AST 20 11/25/2020 0929   ALT 10 11/25/2020 0929   ALKPHOS 55 11/25/2020 0929   BILITOT 0.9 11/25/2020 0929   GFRNONAA 6 (L) 12/02/2020 0820   GFRNONAA 3 (L) 03/29/2013 1409   GFRAA 3 (L) 01/21/2019 0504   GFRAA 3 (L) 03/29/2013 1409     No results found.     Assessment & Plan:   1. ESRD (end stage renal disease) ([place]) The patient still has some continued numbness post intervention for steal syndrome.  However she is able to move her fingers more freely now.  We had a discussion in regards to steal symptoms and treatment.  We discussed banding as well as ligation versus the alternative of tolerating mild steal symptoms we will approve her fistula for use and see if her symptoms worsen during dialysis or if they are still tolerable.  The patient is also advised that if she develops discoloration or ulcerations of her fingertips she should inform us to let us know.  2. Mixed hyperlipidemia Continue statin as ordered and reviewed, no changes at this time   3. Type 2 diabetes mellitus with vascular disease ([place]) Continue hypoglycemic medications as already ordered, these medications have been reviewed and there are no changes at this time.  Hgb A1C to be monitored as already arranged by primary service    Current Outpatient Medications on File Prior to Visit  Medication Sig Dispense Refill  . acetaminophen (TYLENOL) 500 MG tablet Take 500 mg by mouth every 6 (six) hours as needed for moderate pain or headache.    [name] aspirin 81 MG tablet Take 81 mg by mouth daily.    . cephALEXin (KEFLEX) 500 MG  capsule Take 500 mg by mouth 2 (two) times daily.    . cetirizine (ZYRTEC) 10 MG tablet Take 10 mg by mouth daily.    . cinacalcet (SENSIPAR) 30 MG tablet Take 30 mg by mouth Every Tuesday,Thursday,and Saturday with dialysis.    [name] clopidogrel (PLAVIX) 75 MG tablet Take 1 tablet (75 mg total) by mouth daily. 30 tablet 4  . docusate sodium (COLACE) 100 MG capsule Take 100 mg by mouth daily.    [name] latanoprost (XALATAN) 0.005 % ophthalmic solution Place 1 drop into both eyes at bedtime.    . midodrine (PROAMATINE) 5 MG tablet Take 1 tablet (5 mg total) by mouth Every Tuesday,Thursday,and Saturday with dialysis. Please give 30 minutes prior to dialysis. Do NOT hold for dialysis. GIVE DOSE DURING DAYLIGHT HOURS ONLY    . pantoprazole (PROTONIX) 40 MG tablet Take 40 mg by mouth daily.    . pravastatin (PRAVACHOL) 20 MG tablet Take 20 mg by mouth daily.    . sevelamer carbonate (RENVELA) 800 MG tablet Take 1,600 mg by mouth 3 (three) times daily with meals.    . Timolol Maleate 0.5 % (DAILY) SOLN Place 1 drop into both eyes every morning.    [name] HYDROcodone-acetaminophen (NORCO) 5-325 MG tablet Take 1-2 tablets by mouth every 6 (six) hours as  needed for moderate pain or severe pain. (Patient not taking: Reported on 01/09/2021) 40 tablet 0   No current facility-administered medications on file prior to visit.    There are no Patient Instructions on file for this visit. No follow-ups on file.   Kris Hartmann, NP

## 2021-02-20 ENCOUNTER — Encounter: Payer: Self-pay | Admitting: Podiatry

## 2021-02-20 ENCOUNTER — Encounter (INDEPENDENT_AMBULATORY_CARE_PROVIDER_SITE_OTHER): Payer: Self-pay

## 2021-02-20 ENCOUNTER — Ambulatory Visit (INDEPENDENT_AMBULATORY_CARE_PROVIDER_SITE_OTHER): Payer: Medicare Other | Admitting: Podiatry

## 2021-02-20 ENCOUNTER — Other Ambulatory Visit: Payer: Self-pay

## 2021-02-20 DIAGNOSIS — M79674 Pain in right toe(s): Secondary | ICD-10-CM

## 2021-02-20 DIAGNOSIS — M79675 Pain in left toe(s): Secondary | ICD-10-CM

## 2021-02-20 DIAGNOSIS — B351 Tinea unguium: Secondary | ICD-10-CM | POA: Diagnosis not present

## 2021-02-20 DIAGNOSIS — E1159 Type 2 diabetes mellitus with other circulatory complications: Secondary | ICD-10-CM

## 2021-02-20 NOTE — Progress Notes (Signed)
This patient returns to my office for at risk foot care.  This patient requires this care by a professional since this patient will be at risk due to having diabetes with vascular disease and ESRD.  This patient is unable to cut nails herself since the patient cannot reach her nails.These nails are painful walking and wearing shoes.  This patient presents for at risk foot care today.  General Appearance  Alert, conversant and in no acute stress.  Vascular  Dorsalis pedis and posterior tibial  pulses are not  palpable  bilaterally.  Capillary return is within normal limits  bilaterally. Temperature is within normal limits  bilaterally.  Neurologic  Senn-Weinstein monofilament wire test within normal limits  bilaterally. Muscle power within normal limits bilaterally.  Nails Thick disfigured discolored nails with subungual debris  from hallux to fifth toes bilaterally. No evidence of bacterial infection or drainage bilaterally.  Orthopedic  No limitations of motion  feet .  No crepitus or effusions noted.  No bony pathology or digital deformities noted.  Skin  normotropic skin with no porokeratosis noted bilaterally.  No signs of infections or ulcers noted.     Onychomycosis  Pain in right toes  Pain in left toes  Consent was obtained for treatment procedures.   Mechanical debridement of nails 1-5  bilaterally performed with a nail nipper.  Filed with dremel without incident.    Return office visit   3 months                   Told patient to return for periodic foot care and evaluation due to potential at risk complications.   Malky Rudzinski DPM  

## 2021-02-22 ENCOUNTER — Telehealth (INDEPENDENT_AMBULATORY_CARE_PROVIDER_SITE_OTHER): Payer: Self-pay

## 2021-02-22 NOTE — Telephone Encounter (Signed)
I attempted to contact the patient regarding getting a permcath removed. A message was left for a return call.

## 2021-02-28 NOTE — Telephone Encounter (Signed)
Spoke with the patient to schedule her for a permcath removal and per the patient its to soon. Patient stated she would speak with the dialysis center and call back.

## 2021-03-05 ENCOUNTER — Other Ambulatory Visit (INDEPENDENT_AMBULATORY_CARE_PROVIDER_SITE_OTHER): Payer: Self-pay | Admitting: Nurse Practitioner

## 2021-03-05 DIAGNOSIS — N186 End stage renal disease: Secondary | ICD-10-CM

## 2021-03-05 DIAGNOSIS — T82898S Other specified complication of vascular prosthetic devices, implants and grafts, sequela: Secondary | ICD-10-CM

## 2021-03-05 DIAGNOSIS — Z9862 Peripheral vascular angioplasty status: Secondary | ICD-10-CM

## 2021-03-06 ENCOUNTER — Ambulatory Visit (INDEPENDENT_AMBULATORY_CARE_PROVIDER_SITE_OTHER): Payer: Medicare Other | Admitting: Nurse Practitioner

## 2021-03-06 ENCOUNTER — Encounter (INDEPENDENT_AMBULATORY_CARE_PROVIDER_SITE_OTHER): Payer: Medicare Other

## 2021-03-07 ENCOUNTER — Ambulatory Visit (INDEPENDENT_AMBULATORY_CARE_PROVIDER_SITE_OTHER): Payer: Medicare Other | Admitting: Nurse Practitioner

## 2021-03-07 ENCOUNTER — Telehealth (INDEPENDENT_AMBULATORY_CARE_PROVIDER_SITE_OTHER): Payer: Self-pay

## 2021-03-07 ENCOUNTER — Encounter (INDEPENDENT_AMBULATORY_CARE_PROVIDER_SITE_OTHER): Payer: Self-pay | Admitting: Nurse Practitioner

## 2021-03-07 ENCOUNTER — Ambulatory Visit (INDEPENDENT_AMBULATORY_CARE_PROVIDER_SITE_OTHER): Payer: Medicare Other

## 2021-03-07 ENCOUNTER — Other Ambulatory Visit: Payer: Self-pay

## 2021-03-07 VITALS — BP 97/51 | HR 65 | Ht 59.0 in | Wt 155.0 lb

## 2021-03-07 DIAGNOSIS — T82898S Other specified complication of vascular prosthetic devices, implants and grafts, sequela: Secondary | ICD-10-CM

## 2021-03-07 DIAGNOSIS — E1159 Type 2 diabetes mellitus with other circulatory complications: Secondary | ICD-10-CM

## 2021-03-07 DIAGNOSIS — N186 End stage renal disease: Secondary | ICD-10-CM

## 2021-03-07 DIAGNOSIS — Z9862 Peripheral vascular angioplasty status: Secondary | ICD-10-CM | POA: Diagnosis not present

## 2021-03-07 NOTE — Telephone Encounter (Signed)
Patient was seen in office and scheduled for a permcath removal with Dr. Lucky Cowboy on 03/12/21 with a 11:00 am arrival time to the [facility].pre-procedure instructions were discussed and handed to patient.

## 2021-03-12 ENCOUNTER — Encounter
Admission: RE | Disposition: A | Discharge status: Home / Self Care | Payer: Self-pay | Source: Home / Self Care | Attending: Vascular Surgery

## 2021-03-12 ENCOUNTER — Encounter: Payer: Self-pay | Admitting: Vascular Surgery

## 2021-03-12 ENCOUNTER — Ambulatory Visit
Admission: RE | Admit: 2021-03-12 | Discharge: 2021-03-12 | Disposition: A | Discharge status: Home / Self Care | Payer: Medicare Other | Attending: Vascular Surgery | Admitting: Vascular Surgery

## 2021-03-12 ENCOUNTER — Other Ambulatory Visit (INDEPENDENT_AMBULATORY_CARE_PROVIDER_SITE_OTHER): Payer: Self-pay | Admitting: Nurse Practitioner

## 2021-03-12 DIAGNOSIS — N186 End stage renal disease: Secondary | ICD-10-CM | POA: Insufficient documentation

## 2021-03-12 DIAGNOSIS — I120 Hypertensive chronic kidney disease with stage 5 chronic kidney disease or end stage renal disease: Secondary | ICD-10-CM | POA: Diagnosis not present

## 2021-03-12 DIAGNOSIS — Z4901 Encounter for fitting and adjustment of extracorporeal dialysis catheter: Secondary | ICD-10-CM | POA: Diagnosis present

## 2021-03-12 DIAGNOSIS — I2510 Atherosclerotic heart disease of native coronary artery without angina pectoris: Secondary | ICD-10-CM | POA: Insufficient documentation

## 2021-03-12 DIAGNOSIS — Z992 Dependence on renal dialysis: Secondary | ICD-10-CM

## 2021-03-12 DIAGNOSIS — E1122 Type 2 diabetes mellitus with diabetic chronic kidney disease: Secondary | ICD-10-CM | POA: Diagnosis not present

## 2021-03-12 HISTORY — PX: DIALYSIS/PERMA CATHETER REMOVAL: CATH118289

## 2021-03-12 SURGERY — DIALYSIS/PERMA CATHETER REMOVAL
Anesthesia: LOCAL

## 2021-03-12 SURGICAL SUPPLY — 2 items
FORCEPS HALSTEAD CVD 5IN STRL (INSTRUMENTS) ×2 IMPLANT
TRAY LACERAT/PLASTIC (MISCELLANEOUS) ×2 IMPLANT

## 2021-03-12 NOTE — H&P (Signed)
[facility] Admission History & Physical  MRN : [id number]  Kelli Nguyen is a 75 y.o. (04-May-1946) female who presents with chief complaint of No chief complaint on file. [name]  History of Present Illness: I am asked to evaluate the patient by the dialysis center. The patient was sent here because they have a nonfunctioning tunneled catheter and a functioning right arm HeRO graft.  The patient reports they're not been any problems with any of their dialysis runs. They are reporting good flows with good parameters at dialysis.   Patient denies pain or tenderness overlying the access.  There is no pain with dialysis.  The patient denies hand pain or finger pain consistent with steal syndrome.  No fevers or chills while on dialysis.    No current facility-administered medications for this encounter.    Past Medical History:  Diagnosis Date   Anemia    Carpal tunnel syndrome of left wrist    Cataract, bilateral    Chronic cough    Closed displaced fracture of left femoral neck ([place]) 03/09/2017   Coronary artery disease    s/p 3v CABG (LIMA-LAD, SVG-OM1, SVG-PDA) in 2008   Diabetes mellitus without complication (HCC)    ESRD (end stage renal disease) on dialysis ([place])    T-TH-SAT   GERD (gastroesophageal reflux disease)    Grade II diastolic dysfunction    HLD (hyperlipidemia)    Hypertension    Murmur    OA (osteoarthritis)    PAD (peripheral artery disease) (HCC)    Pneumonia    Polyneuropathy    feet and hands   Pulmonary hypertension ([place])    RBBB    Secondary hyperparathyroidism of renal origin ([place])    Sepsis ([place]) 06/28/2017   Wears dentures    partial upper and lower    Past Surgical History:  Procedure Laterality Date   A/V SHUNT INTERVENTION N/A 10/16/2016   Procedure: A/V Shunt Intervention;  Surgeon: Katha Cabal, MD;  Location: [facility];  Service: Cardiovascular;  Laterality: N/A;   A/V SHUNTOGRAM Left 10/16/2016    Procedure: A/V Shuntogram;  Surgeon: Katha Cabal, MD;  Location: [facility];  Service: Cardiovascular;  Laterality: Left;   ABDOMINAL HYSTERECTOMY     CARDIAC CATHETERIZATION N/A 02/26/2014   CARPAL TUNNEL RELEASE Left 09/27/2015   Procedure: CARPAL TUNNEL RELEASE;  Surgeon: Leanor Kail, MD;  Location: [facility];  Service: Orthopedics;  Laterality: Left;   CATARACT EXTRACTION W/PHACO Right 09/23/2016   Procedure: CATARACT EXTRACTION PHACO AND INTRAOCULAR LENS PLACEMENT (IOC) complicated diabetic right;  Surgeon: Ronnell Freshwater, MD;  Location: [facility];  Service: Ophthalmology;  Laterality: Right;  Diabetic - diet controlled Potassium draw before surgery   CATARACT EXTRACTION W/PHACO Left 01/20/2017   Procedure: CATARACT EXTRACTION PHACO AND INTRAOCULAR LENS PLACEMENT ([place]) LEFT DIABETIC;  Surgeon: Eulogio Bear, MD;  Location: [facility];  Service: Ophthalmology;  Laterality: Left;  Diabetic - diet controlled Potassium draw prior to procedure  8:00 ARRIVAL   COLONOSCOPY     COLONOSCOPY WITH PROPOFOL N/A 11/28/2014   Procedure: COLONOSCOPY WITH PROPOFOL;  Surgeon: Manya Silvas, MD;  Location[facility];  Service: Endoscopy;  Laterality: N/A;   CORONARY ARTERY BYPASS GRAFT N/A 2008   3v; LIMA-LAD, SVG-OM1, SVG-PDA; Location: [facility]; Surgeon: Jannett Celestine, MD   DIALYSIS/PERMA CATHETER INSERTION Left 01/21/2019   Procedure: DIALYSIS/PERMA CATHETER EXCHANGE;  Surgeon: Algernon Huxley, MD;  Location: [place];  Service: Cardiovascular;  Laterality: Left;   HEMIARTHROPLASTY HIP Left 2018   INSERTION OF DIALYSIS CATHETER Left 01/19/2019   Procedure: INSERTION OF DIALYSIS CATHETER;  Surgeon: Katha Cabal, MD;  Location: [facility];  Service: Vascular;  Laterality: Left;   LIGATIONS OF HERO GRAFT Left 01/19/2019   Procedure: EXCISION LEFT HERO GRAFT;  Surgeon: Katha Cabal, MD;  Location: [facility];  Service: Vascular;   Laterality: Left;   NEPHRECTOMY Left    PERIPHERAL VASCULAR CATHETERIZATION Left 01/18/2015   Procedure: A/V Shuntogram/Fistulagram;  Surgeon: Katha Cabal, MD;  Location: [facility];  Service: Cardiovascular;  Laterality: Left;   PERIPHERAL VASCULAR CATHETERIZATION Left 01/18/2015   Procedure: A/V Shunt Intervention;  Surgeon: Katha Cabal, MD;  Location: [facility];  Service: Cardiovascular;  Laterality: Left;   PERIPHERAL VASCULAR CATHETERIZATION N/A 08/10/2015   Procedure: Thrombectomy;  Surgeon: Algernon Huxley, MD;  Location: [facility];  Service: Cardiovascular;  Laterality: N/A;   PERIPHERAL VASCULAR CATHETERIZATION N/A 08/10/2015   Procedure: A/V Shuntogram/Fistulagram;  Surgeon: Algernon Huxley, MD;  Location: [facility];  Service: Cardiovascular;  Laterality: N/A;   PERIPHERAL VASCULAR CATHETERIZATION N/A 08/10/2015   Procedure: A/V Shunt Intervention;  Surgeon: Algernon Huxley, MD;  Location: [facility];  Service: Cardiovascular;  Laterality: N/A;   PERIPHERAL VASCULAR THROMBECTOMY Left 01/12/2019   Procedure: PERIPHERAL VASCULAR THROMBECTOMY;  Surgeon: Katha Cabal, MD;  Location: [facility];  Service: Cardiovascular;  Laterality: Left;   REMOVAL OF A DIALYSIS CATHETER Right 01/19/2019   Procedure: REMOVAL OF A DIALYSIS CATHETER;  Surgeon: Katha Cabal, MD;  Location: [facility];  Service: Vascular;  Laterality: Right;   RENAL SHUNTS     UPPER EXTREMITY ANGIOGRAPHY Right 12/20/2020   Procedure: UPPER EXTREMITY ANGIOGRAPHY;  Surgeon: Katha Cabal, MD;  Location: [facility];  Service: Cardiovascular;  Laterality: Right;   UPPER EXTREMITY VENOGRAPHY Right 03/20/2020   Procedure: UPPER EXTREMITY VENOGRAPHY;  Surgeon: Algernon Huxley, MD;  Location: [facility];  Service: Cardiovascular;  Laterality: Right;   VASCULAR ACCESS DEVICE INSERTION Right 11/24/2020   Procedure: INSERTION OF HERO VASCULAR  ACCESS DEVICE (GRAFT);  Surgeon: Katha Cabal, MD;  Location: [facility];  Service: Vascular;  Laterality: Right;     Social History   Tobacco Use   Smoking status: Never   Smokeless tobacco: Never  Vaping Use   Vaping Use: Never used  Substance Use Topics   Alcohol use: No   Drug use: No     Family History  Problem Relation Age of Onset   Hypertension Sister     No family history of bleeding or clotting disorders, autoimmune disease or porphyria  Allergies  Allergen Reactions   Penicillins Hives, Itching and Swelling    Has patient had a PCN reaction causing immediate rash, facial/tongue/throat swelling, SOB or lightheadedness with hypotension: No Has patient had a PCN reaction causing severe rash involving mucus membranes or skin necrosis: No Has patient had a PCN reaction that required hospitalization: No Has patient had a PCN reaction occurring within the last 10 years: No If all of the above answers are "NO", then may proceed with Cephalosporin use.  Penicillin allergy assessment completed on 03/22/20. Patient has received and tolerated cepholosporins incuding cefepime, ceftriaxone.    Codeine Hives   Codeine Sulfate Nausea Only   Contrast Media [Iodinated Diagnostic Agents] Hives   Gadobutrol Hives   Gadolinium Derivatives  Hives   Metrizamide Hives   Statins Other (See Comments)    muscle pain     REVIEW OF SYSTEMS (Negative unless checked)  Constitutional: [] Weight loss  [] Fever  [] Chills Cardiac: [] Chest pain   [] Chest pressure   [] Palpitations   [] Shortness of breath when laying flat   [] Shortness of breath at rest   [x] Shortness of breath with exertion. Vascular:  [] Pain in legs with walking   [] Pain in legs at rest   [] Pain in legs when laying flat   [] Claudication   [] Pain in feet when walking  [] Pain in feet at rest  [] Pain in feet when laying flat   [] History of DVT   [] Phlebitis   [] Swelling in legs   [] Varicose veins   [] Non-healing  ulcers Pulmonary:   [] Uses home oxygen   [] Productive cough   [] Hemoptysis   [] Wheeze  [] COPD   [] Asthma Neurologic:  [] Dizziness  [] Blackouts   [] Seizures   [] History of stroke   [] History of TIA  [] Aphasia   [] Temporary blindness   [] Dysphagia   [] Weakness or numbness in arms   [] Weakness or numbness in legs Musculoskeletal:  [x] Arthritis   [] Joint swelling   [] Joint pain   [] Low back pain Hematologic:  [] Easy bruising  [] Easy bleeding   [] Hypercoagulable state   [x] Anemic  [] Hepatitis Gastrointestinal:  [] Blood in stool   [] Vomiting blood  [] Gastroesophageal reflux/heartburn   [] Difficulty swallowing. Genitourinary:  [x] Chronic kidney disease   [] Difficult urination  [] Frequent urination  [] Burning with urination   [] Blood in urine Skin:  [] Rashes   [] Ulcers   [] Wounds Psychological:  [] History of anxiety   []  History of major depression.  Physical Examination  There were no vitals filed for this visit. There is no height or weight on file to calculate BMI. Gen: WD/WN, NAD Head: [place]/AT, No temporalis wasting.  Ear/Nose/Throat: Hearing grossly intact, nares w/o erythema or drainage, oropharynx w/o Erythema/Exudate,  Eyes: Conjunctiva clear, sclera non-icteric Neck: Trachea midline.  No JVD.  Pulmonary:  Good air movement, respirations not labored, no use of accessory muscles.  Cardiac: RRR, normal S1, S2. Vascular: palpable flow in right arm HeRO graft Vessel Right Left  Radial Palpable Palpable   Musculoskeletal: M/S 5/5 throughout.  Extremities without ischemic changes.  No deformity or atrophy.  Neurologic: Sensation grossly intact in extremities.  Symmetrical.  Speech is fluent. Motor exam as listed above. Psychiatric: Judgment intact, Mood & affect appropriate for pt's clinical situation. Dermatologic: No rashes or ulcers noted.  No cellulitis or open wounds.    CBC Lab Results  Component Value Date   WBC 9.6 12/02/2020   HGB 8.7 (L) 12/02/2020   HCT 27.0 (L) 12/02/2020    MCV 86.0 12/02/2020   PLT 203 12/02/2020    BMET    Component Value Date/Time   NA 136 12/02/2020 0820   NA 135 (L) 03/29/2013 1409   K 4.0 12/02/2020 0820   K 4.5 03/29/2013 1409   CL 96 (L) 12/02/2020 0820   CL 97 (L) 03/29/2013 1409   CO2 25 12/02/2020 0820   CO2 27 03/29/2013 1409   GLUCOSE 108 (H) 12/02/2020 0820   GLUCOSE 97 03/29/2013 1409   BUN 31 (H) 12/02/2020 0820   BUN 48 (H) 03/29/2013 1409   CREATININE 6.81 (H) 12/02/2020 0820   CREATININE 12.06 (H) 03/29/2013 1409   CALCIUM 8.3 (L) 12/02/2020 0820   CALCIUM 8.8 03/29/2013 1409   GFRNONAA 6 (L) 12/02/2020 0820   GFRNONAA 3 (L) 03/29/2013 1409  GFRAA 3 (L) 01/21/2019 0504   GFRAA 3 (L) 03/29/2013 1409   CrCl cannot be calculated (Patient's most recent lab result is older than the maximum 21 days allowed.).  COAG Lab Results  Component Value Date   INR 1.1 11/30/2020   INR 1.1 11/25/2020   INR 1.1 11/17/2020    Radiology VAS [place] [facility] (AVF, AVG)  Result Date: 03/12/2021 DIALYSIS ACCESS Patient Name:  MARIFER HURD [facility]  Date of Exam:   03/07/2021 Medical Rec #: [id number]        Accession #:    [id number] Date of Birth: [birth year]         Patient Gender: F Patient Age:   46 years Exam Location:  [facility] Procedure:      VAS US DUPLEX DIALYSIS ACCESS (AVF, AVG) Referring Phys: Eulogio Ditch --------------------------------------------------------------------------------  Access Site: Right Upper Extremity. Access Type: Hero Graft. History: Created 11/24/2020           12/20/2020: Right Upper Extremiry Angiography third order placement          with additional third order. PTA to 2 mm Right Ulnar Artery. PTA to 2          mm Right Radial Artery. Administration of Intra-Arterial Nitroglycerine          Right Upper Extremity 300 mcg total dose. Comparison Study: 12/13/2020 Performing Technologist: Almira Coaster RVS  Examination Guidelines: A complete evaluation includes B-mode imaging,  spectral Doppler, color Doppler, and power Doppler as needed of all accessible portions of each vessel. Unilateral testing is considered an integral part of a complete examination. Limited examinations for reoccurring indications may be performed as noted.  Findings: +--------------------+----------+-----------------+--------+ AVF                 PSV (cm/s)Flow Vol (mL/min)Comments +--------------------+----------+-----------------+--------+ Native artery inflow   342           961                +--------------------+----------+-----------------+--------+ AVF Anastomosis        430                              +--------------------+----------+-----------------+--------+  +---------------+----------+-------------+----------+--------+ OUTFLOW VEIN   PSV (cm/s)Diameter (cm)Depth (cm)Describe +---------------+----------+-------------+----------+--------+ Subclavian vein    9                                     +---------------+----------+-------------+----------+--------+ Confluence        159                                    +---------------+----------+-------------+----------+--------+ Shoulder          210                                    +---------------+----------+-------------+----------+--------+ Prox UA           137                                    +---------------+----------+-------------+----------+--------+ Mid UA            153                                    +---------------+----------+-------------+----------+--------+  Dist UA           234                                    +---------------+----------+-------------+----------+--------+  +--------------+-------------+---------+---------+---------+-------------------+               Diameter (cm)  Depth  Branching   PSV       Flow Volume                                  (cm)             (cm/s)       (ml/min)        +--------------+-------------+---------+---------+---------+-------------------+ Rt Rad Art                                      40                        Dist                                                                      +--------------+-------------+---------+---------+---------+-------------------+  Summary: The Right Upper Arm Hero Graft appears to be Patent throughout; Flow Volume appears to be Normal.  *See table(s) above for measurements and observations.  Diagnosing physician: Leotis Pain MD Electronically signed by Leotis Pain MD on 03/12/2021 at 10:10:54 AM.   --------------------------------------------------------------------------------   Final     Assessment/Plan 1.  Complication dialysis device:  Patient's Tunneled catheter is not being used. The patient has an extremity access that is functioning well. Therefore, the patient will undergo removal of the tunneled catheter under local anesthesia.  The risks and benefits were described to the patient.  All questions were answered.  The patient agrees to proceed with angiography and intervention. Potassium will be drawn to ensure that it is an appropriate level prior to performing intervention. 2.  End-stage renal disease requiring hemodialysis:  Patient will continue dialysis therapy without further interruption  3.  Hypertension:  Patient will continue medical management; nephrology is following no changes in oral medications. 4. Diabetes mellitus:  Glucose will be monitored and oral medications been held this morning once the patient has undergone the patient's procedure po intake will be reinitiated and again Accu-Cheks will be used to assess the blood glucose level and treat as needed. The patient will be restarted on the patient's usual hypoglycemic regime 5.  Coronary artery disease:  EKG will be monitored. Nitrates will be used if needed. The patient's oral cardiac medications will be continued.    Leotis Pain,  MD  03/12/2021 11:04 AM

## 2021-03-12 NOTE — Discharge Instructions (Signed)
Tunneled Catheter Removal, Care After Refer to this sheet in the next few weeks. These instructions provide you with information about caring for yourself after your procedure. Your health care provider may also give you more specific instructions. Your treatment has been planned according to current medical practices, but problems sometimes occur. Call your health care provider if you have any problems or questions after your procedure. What can I expect after the procedure? After the procedure, it is common to have: Some mild redness, swelling, and pain around your catheter site.   Follow these instructions at home: Incision care  Check your removal site  every day for signs of infection. Check for: More redness, swelling, or pain. More fluid or blood. Warmth. Pus or a bad smell. Remove your dressing in 48hrs leave open to air  Activity  Return to your normal activities as told by your health care provider. Ask your health care provider what activities are safe for you. Do not lift anything that is heavier than 10 lb (4.5 kg) for 3 days  You may shower tomorrow  Contact a health care provider if: You have more fluid or blood coming from your removal site You have more redness, swelling, or pain at your incisions or around the area where your catheter was removed Your removal site feel warm to the touch. You feel unusually weak. You feel nauseous.. Get help right away if You have swelling in your arm, shoulder, neck, or face. You develop chest pain. You have difficulty breathing. You feel dizzy or light-headed. You have pus or a bad smell coming from your removal site You have a fever. You develop bleeding from your removal site, and your bleeding does not stop. This information is not intended to replace advice given to you by your health care provider. Make sure you discuss any questions you have with your health care provider. Document Released: 04/08/2012 Document Revised:  12/24/2015 Document Reviewed: 01/16/2015 Elsevier Interactive Patient Education  2017 [employer] 

## 2021-03-12 NOTE — Op Note (Signed)
Operative Note     Preoperative diagnosis:   1. ESRD with functional permanent access  Postoperative diagnosis:  1. ESRD with functional permanent access  Procedure:  Removal of left jugular Permcath  Surgeon:  Leotis Pain, MD  Anesthesia:  Local  EBL:  Minimal  Indication for the Procedure:  The patient has a functional permanent dialysis access and no longer needs their permcath.  This can be removed.  Risks and benefits are discussed and informed consent is obtained.  Description of the Procedure:  The patient's left neck, chest and existing catheter were sterilely prepped and draped. The area around the catheter was anesthetized copiously with 1% lidocaine. The catheter was dissected out with curved hemostats until the cuff was freed from the surrounding fibrous sheath. The fiber sheath was transected, and the catheter was then removed in its entirety using gentle traction. Pressure was held and sterile dressings were placed. The patient tolerated the procedure well and was taken to the recovery room in stable condition.     Leotis Pain  03/12/2021, 12:11 PM This note was created with Dragon Medical transcription system. Any errors in dictation are purely unintentional.

## 2021-03-13 ENCOUNTER — Encounter: Payer: Self-pay | Admitting: Vascular Surgery

## 2021-03-17 ENCOUNTER — Other Ambulatory Visit (INDEPENDENT_AMBULATORY_CARE_PROVIDER_SITE_OTHER): Payer: Self-pay | Admitting: Vascular Surgery

## 2021-03-18 ENCOUNTER — Encounter (INDEPENDENT_AMBULATORY_CARE_PROVIDER_SITE_OTHER): Payer: Self-pay | Admitting: Nurse Practitioner

## 2021-03-18 NOTE — Progress Notes (Signed)
Subjective:    Patient ID: Kelli Nguyen, female    DOB: 06/23/1945, 75 y.o.   MRN: [id number] Chief Complaint  Patient presents with  . Follow-up    1 mo [place]    Kelli Nguyen is a 75 year old female that presents today for follow-up following intervention for steal syndrome.  The patient notes that her hand feels much better.  She underwent intervention on 12/20/2020 including:  Procedure(s) Performed:             1.  Arch aortogram             2.  Right upper extremity angiography third order catheter placement with additional third order             3.  Percutaneous transluminal angioplasty to 2 mm right ulnar artery             4.  Percutaneous transluminal angioplasty to 2 mm right radial artery             5.  Administration of intra-arterial nitroglycerin right upper extremity 300 mcg total dose             6.  [employer] right common femoral artery  Today she does that her numbness is improved.  She denies any wounds or ulcerations.  The patient has a right upper extremity hero graft.  She has a flow volume of 961.  With no areas of significant stenosis noted.   Review of Systems  Neurological:  Negative for numbness.  Hematological:  Does not bruise/bleed easily.  All other systems reviewed and are negative.     Objective:   Physical Exam Vitals reviewed.  HENT:     Head: Normocephalic.  Cardiovascular:     Rate and Rhythm: Normal rate.     Pulses:          Radial pulses are 1+ on the right side.     Arteriovenous access: Right arteriovenous access is present.    Comments: Good thrill and bruit Pulmonary:     Effort: Pulmonary effort is normal.  Skin:    General: Skin is warm and dry.  Neurological:     Mental Status: She is alert and oriented to person, place, and time.  Psychiatric:        Mood and Affect: Mood normal.        Behavior: Behavior normal.        Thought Content: Thought content normal.        Judgment: Judgment normal.    BP (!) 97/51    Pulse 65   Ht 4\' 11"  (1.499 m)   Wt 155 lb (70.3 kg)   BMI 31.31 kg/m   Past Medical History:  Diagnosis Date  . Anemia   . Carpal tunnel syndrome of left wrist   . Cataract, bilateral   . Chronic cough   . Closed displaced fracture of left femoral neck ([place]) 03/09/2017  . Coronary artery disease    s/p 3v CABG (LIMA-LAD, SVG-OM1, SVG-PDA) in 2008  . Diabetes mellitus without complication ([place])   . ESRD (end stage renal disease) on dialysis ([place])    T-TH-SAT  . GERD (gastroesophageal reflux disease)   . Grade II diastolic dysfunction   . HLD (hyperlipidemia)   . Hypertension   . Murmur   . OA (osteoarthritis)   . PAD (peripheral artery disease) ([place])   . Pneumonia   . Polyneuropathy    feet and hands  . Pulmonary hypertension ([place])   .  RBBB   . Secondary hyperparathyroidism of renal origin ([place])   . Sepsis ([place]) 06/28/2017  . Wears dentures    partial upper and lower    Social History   Socioeconomic History  . Marital status: Widowed    Spouse name: Not on file  . Number of children: 5  . Years of education: Not on file  . Highest education level: Not on file  Occupational History  . Not on file  Tobacco Use  . Smoking status: Never  . Smokeless tobacco: Never  Vaping Use  . Vaping Use: Never used  Substance and Sexual Activity  . Alcohol use: No  . Drug use: No  . Sexual activity: Not on file  Other Topics Concern  . Not on file  Social History Narrative   Lives with son   Social Determinants of Health   Financial Resource Strain: Not on file  Food Insecurity: Not on file  Transportation Needs: Not on file  Physical Activity: Not on file  Stress: Not on file  Social Connections: Not on file  Intimate Partner Violence: Not on file    Past Surgical History:  Procedure Laterality Date  . A/V SHUNT INTERVENTION N/A 10/16/2016   Procedure: A/V Shunt Intervention;  Surgeon: Katha Cabal, MD;  Location: [facility];  Service:  Cardiovascular;  Laterality: N/A;  . A/V SHUNTOGRAM Left 10/16/2016   Procedure: A/V Shuntogram;  Surgeon: Katha Cabal, MD;  Location: [facility];  Service: Cardiovascular;  Laterality: Left;  . ABDOMINAL HYSTERECTOMY    . CARDIAC CATHETERIZATION N/A 02/26/2014  . CARPAL TUNNEL RELEASE Left 09/27/2015   Procedure: CARPAL TUNNEL RELEASE;  Surgeon: Leanor Kail, MD;  Location: [facility];  Service: Orthopedics;  Laterality: Left;  . CATARACT EXTRACTION W/PHACO Right 09/23/2016   Procedure: CATARACT EXTRACTION PHACO AND INTRAOCULAR LENS PLACEMENT (IOC) complicated diabetic right;  Surgeon: Ronnell Freshwater, MD;  Location: [facility];  Service: Ophthalmology;  Laterality: Right;  Diabetic - diet controlled Potassium draw before surgery  . CATARACT EXTRACTION W/PHACO Left 01/20/2017   Procedure: CATARACT EXTRACTION PHACO AND INTRAOCULAR LENS PLACEMENT ([place]) LEFT DIABETIC;  Surgeon: Eulogio Bear, MD;  Location: [facility];  Service: Ophthalmology;  Laterality: Left;  Diabetic - diet controlled Potassium draw prior to procedure  8:00 ARRIVAL  . COLONOSCOPY    . COLONOSCOPY WITH PROPOFOL N/A 11/28/2014   Procedure: COLONOSCOPY WITH PROPOFOL;  Surgeon: Manya Silvas, MD;  Location: [facility];  Service: Endoscopy;  Laterality: N/A;  . CORONARY ARTERY BYPASS GRAFT N/A 2008   3v; LIMA-LAD, SVG-OM1, SVG-PDA; Location: [facility]; Surgeon: Jannett Celestine, MD  . DIALYSIS/PERMA CATHETER INSERTION Left 01/21/2019   Procedure: DIALYSIS/PERMA CATHETER EXCHANGE;  Surgeon: Algernon Huxley, MD;  Location: [facility];  Service: Cardiovascular;  Laterality: Left;  . DIALYSIS/PERMA CATHETER REMOVAL N/A 03/12/2021   Procedure: DIALYSIS/PERMA CATHETER REMOVAL;  Surgeon: Algernon Huxley, MD;  Location: [facility];  Service: Cardiovascular;  Laterality: N/A;  . HEMIARTHROPLASTY HIP Left 2018  . INSERTION OF DIALYSIS CATHETER Left 01/19/2019   Procedure:  INSERTION OF DIALYSIS CATHETER;  Surgeon: Katha Cabal, MD;  Location: [facility];  Service: Vascular;  Laterality: Left;  . LIGATIONS OF HERO GRAFT Left 01/19/2019   Procedure: EXCISION LEFT HERO GRAFT;  Surgeon: Katha Cabal, MD;  Location: [facility];  Service: Vascular;  Laterality: Left;  . NEPHRECTOMY Left   . PERIPHERAL VASCULAR CATHETERIZATION Left 01/18/2015   Procedure: A/V  Shuntogram/Fistulagram;  Surgeon: Katha Cabal, MD;  Location: [facility];  Service: Cardiovascular;  Laterality: Left;  . PERIPHERAL VASCULAR CATHETERIZATION Left 01/18/2015   Procedure: A/V Shunt Intervention;  Surgeon: Katha Cabal, MD;  Location: [facility];  Service: Cardiovascular;  Laterality: Left;  . PERIPHERAL VASCULAR CATHETERIZATION N/A 08/10/2015   Procedure: Thrombectomy;  Surgeon: Algernon Huxley, MD;  Location: [facility];  Service: Cardiovascular;  Laterality: N/A;  . PERIPHERAL VASCULAR CATHETERIZATION N/A 08/10/2015   Procedure: A/V Shuntogram/Fistulagram;  Surgeon: Algernon Huxley, MD;  Location: [facility];  Service: Cardiovascular;  Laterality: N/A;  . PERIPHERAL VASCULAR CATHETERIZATION N/A 08/10/2015   Procedure: A/V Shunt Intervention;  Surgeon: Algernon Huxley, MD;  Location: [facility];  Service: Cardiovascular;  Laterality: N/A;  . PERIPHERAL VASCULAR THROMBECTOMY Left 01/12/2019   Procedure: PERIPHERAL VASCULAR THROMBECTOMY;  Surgeon: Katha Cabal, MD;  Location: [facility];  Service: Cardiovascular;  Laterality: Left;  . REMOVAL OF A DIALYSIS CATHETER Right 01/19/2019   Procedure: REMOVAL OF A DIALYSIS CATHETER;  Surgeon: Katha Cabal, MD;  Location: [facility];  Service: Vascular;  Laterality: Right;  . RENAL SHUNTS    . UPPER EXTREMITY ANGIOGRAPHY Right 12/20/2020   Procedure: UPPER EXTREMITY ANGIOGRAPHY;  Surgeon: Katha Cabal, MD;  Location: [facility];  Service: Cardiovascular;  Laterality:  Right;  . UPPER EXTREMITY VENOGRAPHY Right 03/20/2020   Procedure: UPPER EXTREMITY VENOGRAPHY;  Surgeon: Algernon Huxley, MD;  Location: [facility];  Service: Cardiovascular;  Laterality: Right;  [name] VASCULAR ACCESS DEVICE INSERTION Right 11/24/2020   Procedure: INSERTION OF HERO VASCULAR ACCESS DEVICE (GRAFT);  Surgeon: Katha Cabal, MD;  Location: [facility];  Service: Vascular;  Laterality: Right;    Family History  Problem Relation Age of Onset  . Hypertension Sister     Allergies  Allergen Reactions  . Penicillins Hives, Itching and Swelling    Has patient had a PCN reaction causing immediate rash, facial/tongue/throat swelling, SOB or lightheadedness with hypotension: No Has patient had a PCN reaction causing severe rash involving mucus membranes or skin necrosis: No Has patient had a PCN reaction that required hospitalization: No Has patient had a PCN reaction occurring within the last 10 years: No If all of the above answers are "NO", then may proceed with Cephalosporin use.  Penicillin allergy assessment completed on 03/22/20. Patient has received and tolerated cepholosporins incuding cefepime, ceftriaxone.   . Codeine Hives  . Codeine Sulfate Nausea Only  . Contrast Media [Iodinated Diagnostic Agents] Hives  . Gadobutrol Hives  . Gadolinium Derivatives Hives  . Metrizamide Hives  . Statins Other (See Comments)    muscle pain    CBC Latest Ref Rng & Units 12/02/2020 12/01/2020 11/30/2020  WBC 4.0 - 10.5 K/uL 9.6 8.4 10.1  Hemoglobin 12.0 - 15.0 g/dL 8.7(L) 9.5(L) 7.7(L)  Hematocrit 36.0 - 46.0 % 27.0(L) 28.9(L) 24.2(L)  Platelets 150 - 400 K/uL 203 217 241      CMP     Component Value Date/Time   NA 136 12/02/2020 0820   NA 135 (L) 03/29/2013 1409   K 4.0 12/02/2020 0820   K 4.5 03/29/2013 1409   CL 96 (L) 12/02/2020 0820   CL 97 (L) 03/29/2013 1409   CO2 25 12/02/2020 0820   CO2 27 03/29/2013 1409   GLUCOSE 108 (H) 12/02/2020 0820   GLUCOSE 97  03/29/2013 1409   BUN 31 (H)  12/02/2020 0820   BUN 48 (H) 03/29/2013 1409   CREATININE 6.81 (H) 12/02/2020 0820   CREATININE 12.06 (H) 03/29/2013 1409   CALCIUM 8.3 (L) 12/02/2020 0820   CALCIUM 8.8 03/29/2013 1409   PROT 7.1 11/25/2020 0929   ALBUMIN 3.8 11/25/2020 0929   AST 20 11/25/2020 0929   ALT 10 11/25/2020 0929   ALKPHOS 55 11/25/2020 0929   BILITOT 0.9 11/25/2020 0929   GFRNONAA 6 (L) 12/02/2020 0820   GFRNONAA 3 (L) 03/29/2013 1409   GFRAA 3 (L) 01/21/2019 0504   GFRAA 3 (L) 03/29/2013 1409     No results found.     Assessment & Plan:   1. ESRD (end stage renal disease) ([place]) Recommend:  The patient is doing well and currently has adequate dialysis access. The patient's dialysis center is not reporting any access issues. Flow pattern is stable when compared to the prior ultrasound.  The patient should have a duplex ultrasound of the dialysis access in 6 months. The patient will follow-up with me in the office after each ultrasound     2. Steal syndrome of hand, sequela This is improved for the patient.  She still has some issues with her hands but that is more orthopedic in nature.  3. Type 2 diabetes mellitus with vascular disease ([place]) Continue hypoglycemic medications as already ordered, these medications have been reviewed and there are no changes at this time.  Hgb A1C to be monitored as already arranged by primary service    Current Outpatient Medications on File Prior to Visit  Medication Sig Dispense Refill  . acetaminophen (TYLENOL) 500 MG tablet Take 500 mg by mouth every 6 (six) hours as needed for moderate pain or headache.    [name] amoxicillin-clavulanate (AUGMENTIN) 500-125 MG tablet Take by mouth daily as needed.    [name] aspirin 81 MG tablet Take 81 mg by mouth daily.    . cephALEXin (KEFLEX) 500 MG capsule Take 500 mg by mouth 2 (two) times daily.    . cetirizine (ZYRTEC) 10 MG tablet Take 10 mg by mouth daily.    . cinacalcet (SENSIPAR) 30 MG  tablet Take 30 mg by mouth Every Tuesday,Thursday,and Saturday with dialysis.    [name] clopidogrel (PLAVIX) 75 MG tablet Take 1 tablet (75 mg total) by mouth daily. 30 tablet 4  . docusate sodium (COLACE) 100 MG capsule Take 100 mg by mouth daily.    [name] HYDROcodone-acetaminophen (NORCO) 5-325 MG tablet Take 1-2 tablets by mouth every 6 (six) hours as needed for moderate pain or severe pain. 40 tablet 0  . latanoprost (XALATAN) 0.005 % ophthalmic solution Place 1 drop into both eyes at bedtime.    . midodrine (PROAMATINE) 5 MG tablet Take 1 tablet (5 mg total) by mouth Every Tuesday,Thursday,and Saturday with dialysis. Please give 30 minutes prior to dialysis. Do NOT hold for dialysis. GIVE DOSE DURING DAYLIGHT HOURS ONLY    . pantoprazole (PROTONIX) 40 MG tablet Take 40 mg by mouth daily.    . pravastatin (PRAVACHOL) 20 MG tablet Take 20 mg by mouth daily.    . sevelamer carbonate (RENVELA) 800 MG tablet Take 1,600 mg by mouth 3 (three) times daily with meals.    . Timolol Maleate 0.5 % (DAILY) SOLN Place 1 drop into both eyes every morning.     No current facility-administered medications on file prior to visit.    There are no Patient Instructions on file for this visit. No follow-ups on file.   Lucretia Roers  [place], NP

## 2021-06-04 ENCOUNTER — Ambulatory Visit: Payer: Medicare Other | Admitting: Podiatry

## 2021-06-06 ENCOUNTER — Other Ambulatory Visit (INDEPENDENT_AMBULATORY_CARE_PROVIDER_SITE_OTHER): Payer: Self-pay | Admitting: Nurse Practitioner

## 2021-06-06 ENCOUNTER — Other Ambulatory Visit: Payer: Self-pay

## 2021-06-06 ENCOUNTER — Ambulatory Visit (INDEPENDENT_AMBULATORY_CARE_PROVIDER_SITE_OTHER): Payer: Medicare Other | Admitting: Nurse Practitioner

## 2021-06-06 ENCOUNTER — Other Ambulatory Visit (INDEPENDENT_AMBULATORY_CARE_PROVIDER_SITE_OTHER): Payer: Medicare Other

## 2021-06-06 VITALS — BP 147/71 | HR 69 | Ht 59.0 in | Wt 153.0 lb

## 2021-06-06 DIAGNOSIS — E1159 Type 2 diabetes mellitus with other circulatory complications: Secondary | ICD-10-CM

## 2021-06-06 DIAGNOSIS — N186 End stage renal disease: Secondary | ICD-10-CM

## 2021-06-06 DIAGNOSIS — E782 Mixed hyperlipidemia: Secondary | ICD-10-CM | POA: Diagnosis not present

## 2021-06-10 ENCOUNTER — Encounter (INDEPENDENT_AMBULATORY_CARE_PROVIDER_SITE_OTHER): Payer: Self-pay | Admitting: Nurse Practitioner

## 2021-06-10 NOTE — Progress Notes (Signed)
Subjective:    Patient ID: Elyn Peers, female    DOB: 01/08/1946, 76 y.o.   MRN: [id number] Chief Complaint  Patient presents with   Follow-up    3 Mo Hda    The patient returns to the office for followup of their dialysis access. The function of the access has been stable. The patient denies increased bleeding time or increased recirculation. Patient denies difficulty with cannulation.  She has no issues with arm swelling.  The patient has had history of steal syndrome and notes that her hand is experiencing somewhat more numbness than previous.  The patient denies redness or swelling at the access site. The patient denies fever or chills at home or while on dialysis.  The patient denies amaurosis fugax or recent TIA symptoms. There are no recent neurological changes noted. The patient denies claudication symptoms or rest pain symptoms. The patient denies history of DVT, PE or superficial thrombophlebitis. The patient denies recent episodes of angina or shortness of breath.   Today the patient has a flow volume of 1281.  There is a small pseudoaneurysm noted.  There is no flow detected in the right mid to distal radial artery.  Antegrade flow in the right ulnar artery.       Review of Systems  Neurological:  Positive for weakness and numbness.  All other systems reviewed and are negative.     Objective:   Physical Exam Vitals reviewed.  HENT:     Head: Normocephalic.  Cardiovascular:     Rate and Rhythm: Normal rate.     Pulses:          Radial pulses are 0 on the right side.  Pulmonary:     Effort: Pulmonary effort is normal.  Skin:    General: Skin is dry.  Neurological:     Mental Status: She is alert and oriented to person, place, and time.     Motor: Weakness present.  Psychiatric:        Mood and Affect: Mood normal.        Behavior: Behavior normal.        Thought Content: Thought content normal.        Judgment: Judgment normal.    BP (!) 147/71     Pulse 69    Ht 4\' 11"  (1.499 m)    Wt 153 lb (69.4 kg)    BMI 30.90 kg/m   Past Medical History:  Diagnosis Date   Anemia    Carpal tunnel syndrome of left wrist    Cataract, bilateral    Chronic cough    Closed displaced fracture of left femoral neck ([place]) 03/09/2017   Coronary artery disease    s/p 3v CABG (LIMA-LAD, SVG-OM1, SVG-PDA) in 2008   Diabetes mellitus without complication (HCC)    ESRD (end stage renal disease) on dialysis ([place])    T-TH-SAT   GERD (gastroesophageal reflux disease)    Grade II diastolic dysfunction    HLD (hyperlipidemia)    Hypertension    Murmur    OA (osteoarthritis)    PAD (peripheral artery disease) (HCC)    Pneumonia    Polyneuropathy    feet and hands   Pulmonary hypertension (HCC)    RBBB    Secondary hyperparathyroidism of renal origin ([place])    Sepsis ([place]) 06/28/2017   Wears dentures    partial upper and lower    Social History   Socioeconomic History   Marital status: Widowed  Spouse name: Not on file   Number of children: 5   Years of education: Not on file   Highest education level: Not on file  Occupational History   Not on file  Tobacco Use   Smoking status: Never   Smokeless tobacco: Never  Vaping Use   Vaping Use: Never used  Substance and Sexual Activity   Alcohol use: No   Drug use: No   Sexual activity: Not on file  Other Topics Concern   Not on file  Social History Narrative   Lives with son   Social Determinants of Health   Financial Resource Strain: Not on file  Food Insecurity: Not on file  Transportation Needs: Not on file  Physical Activity: Not on file  Stress: Not on file  Social Connections: Not on file  Intimate Partner Violence: Not on file    Past Surgical History:  Procedure Laterality Date   A/V SHUNT INTERVENTION N/A 10/16/2016   Procedure: A/V Shunt Intervention;  Surgeon: Katha Cabal, MD;  Location: [facility];  Service: Cardiovascular;  Laterality: N/A;   A/V  SHUNTOGRAM Left 10/16/2016   Procedure: A/V Shuntogram;  Surgeon: Katha Cabal, MD;  Location: [facility];  Service: Cardiovascular;  Laterality: Left;   ABDOMINAL HYSTERECTOMY     CARDIAC CATHETERIZATION N/A 02/26/2014   CARPAL TUNNEL RELEASE Left 09/27/2015   Procedure: CARPAL TUNNEL RELEASE;  Surgeon: Leanor Kail, MD;  Location: [facility];  Service: Orthopedics;  Laterality: Left;   CATARACT EXTRACTION W/PHACO Right 09/23/2016   Procedure: CATARACT EXTRACTION PHACO AND INTRAOCULAR LENS PLACEMENT (IOC) complicated diabetic right;  Surgeon: Ronnell Freshwater, MD;  Location: [facility];  Service: Ophthalmology;  Laterality: Right;  Diabetic - diet controlled Potassium draw before surgery   CATARACT EXTRACTION W/PHACO Left 01/20/2017   Procedure: CATARACT EXTRACTION PHACO AND INTRAOCULAR LENS PLACEMENT ([place]) LEFT DIABETIC;  Surgeon: Eulogio Bear, MD;  Location: [facility];  Service: Ophthalmology;  Laterality: Left;  Diabetic - diet controlled Potassium draw prior to procedure  8:00 ARRIVAL   COLONOSCOPY     COLONOSCOPY WITH PROPOFOL N/A 11/28/2014   Procedure: COLONOSCOPY WITH PROPOFOL;  Surgeon: Manya Silvas, MD;  Location: [facility];  Service: Endoscopy;  Laterality: N/A;   CORONARY ARTERY BYPASS GRAFT N/A 2008   3v; LIMA-LAD, SVG-OM1, SVG-PDA; Location: [facility]; Surgeon: Jannett Celestine, MD   DIALYSIS/PERMA CATHETER INSERTION Left 01/21/2019   Procedure: DIALYSIS/PERMA CATHETER EXCHANGE;  Surgeon: Algernon Huxley, MD;  Location: [facility];  Service: Cardiovascular;  Laterality: Left;   DIALYSIS/PERMA CATHETER REMOVAL N/A 03/12/2021   Procedure: DIALYSIS/PERMA CATHETER REMOVAL;  Surgeon: Algernon Huxley, MD;  Location: [facility];  Service: Cardiovascular;  Laterality: N/A;   HEMIARTHROPLASTY HIP Left 2018   INSERTION OF DIALYSIS CATHETER Left 01/19/2019   Procedure: INSERTION OF DIALYSIS CATHETER;  Surgeon: Katha Cabal, MD;  Location: [facility];  Service: Vascular;  Laterality: Left;   LIGATIONS OF HERO GRAFT Left 01/19/2019   Procedure: EXCISION LEFT HERO GRAFT;  Surgeon: Katha Cabal, MD;  Location: [facility];  Service: Vascular;  Laterality: Left;   NEPHRECTOMY Left    PERIPHERAL VASCULAR CATHETERIZATION Left 01/18/2015   Procedure: A/V Shuntogram/Fistulagram;  Surgeon: Katha Cabal, MD;  Location: [facility];  Service: Cardiovascular;  Laterality: Left;   PERIPHERAL VASCULAR CATHETERIZATION Left 01/18/2015   Procedure: A/V Shunt Intervention;  Surgeon: Katha Cabal, MD;  Location: [facility];  Service: Cardiovascular;  Laterality: Left;   PERIPHERAL VASCULAR CATHETERIZATION N/A 08/10/2015   Procedure: Thrombectomy;  Surgeon: Algernon Huxley, MD;  Location: [facility];  Service: Cardiovascular;  Laterality: N/A;   PERIPHERAL VASCULAR CATHETERIZATION N/A 08/10/2015   Procedure: A/V Shuntogram/Fistulagram;  Surgeon: Algernon Huxley, MD;  Location: [facility];  Service: Cardiovascular;  Laterality: N/A;   PERIPHERAL VASCULAR CATHETERIZATION N/A 08/10/2015   Procedure: A/V Shunt Intervention;  Surgeon: Algernon Huxley, MD;  Location: [facility];  Service: Cardiovascular;  Laterality: N/A;   PERIPHERAL VASCULAR THROMBECTOMY Left 01/12/2019   Procedure: PERIPHERAL VASCULAR THROMBECTOMY;  Surgeon: Katha Cabal, MD;  Location: [facility];  Service: Cardiovascular;  Laterality: Left;   REMOVAL OF A DIALYSIS CATHETER Right 01/19/2019   Procedure: REMOVAL OF A DIALYSIS CATHETER;  Surgeon: Katha Cabal, MD;  Location: [facility];  Service: Vascular;  Laterality: Right;   RENAL SHUNTS     UPPER EXTREMITY ANGIOGRAPHY Right 12/20/2020   Procedure: UPPER EXTREMITY ANGIOGRAPHY;  Surgeon: Katha Cabal, MD;  Location: [facility];  Service: Cardiovascular;  Laterality: Right;   UPPER EXTREMITY VENOGRAPHY Right 03/20/2020    Procedure: UPPER EXTREMITY VENOGRAPHY;  Surgeon: Algernon Huxley, MD;  Location: [facility];  Service: Cardiovascular;  Laterality: Right;   VASCULAR ACCESS DEVICE INSERTION Right 11/24/2020   Procedure: INSERTION OF HERO VASCULAR ACCESS DEVICE (GRAFT);  Surgeon: Katha Cabal, MD;  Location: [facility];  Service: Vascular;  Laterality: Right;    Family History  Problem Relation Age of Onset   Hypertension Sister     Allergies  Allergen Reactions   Gadolinium Derivatives Hives    Other reaction(s): Unknown Other reaction(s): Other (See Comments) Other reaction(s): Unknown   Penicillins Hives, Itching and Swelling    Has patient had a PCN reaction causing immediate rash, facial/tongue/throat swelling, SOB or lightheadedness with hypotension: No Has patient had a PCN reaction causing severe rash involving mucus membranes or skin necrosis: No Has patient had a PCN reaction that required hospitalization: No Has patient had a PCN reaction occurring within the last 10 years: No If all of the above answers are "NO", then may proceed with Cephalosporin use.  Penicillin allergy assessment completed on 03/22/20. Patient has received and tolerated cepholosporins incuding cefepime, ceftriaxone.    Codeine Hives   Codeine Sulfate Nausea Only   Contrast Media [Iodinated Contrast Media] Hives   Gadobutrol Hives   Metrizamide Hives   Statins Other (See Comments)    muscle pain    CBC Latest Ref Rng & Units 12/02/2020 12/01/2020 11/30/2020  WBC 4.0 - 10.5 K/uL 9.6 8.4 10.1  Hemoglobin 12.0 - 15.0 g/dL 8.7(L) 9.5(L) 7.7(L)  Hematocrit 36.0 - 46.0 % 27.0(L) 28.9(L) 24.2(L)  Platelets 150 - 400 K/uL 203 217 241      CMP     Component Value Date/Time   NA 136 12/02/2020 0820   NA 135 (L) 03/29/2013 1409   K 4.0 12/02/2020 0820   K 4.5 03/29/2013 1409   CL 96 (L) 12/02/2020 0820   CL 97 (L) 03/29/2013 1409   CO2 25 12/02/2020 0820   CO2 27 03/29/2013 1409   GLUCOSE 108 (H)  12/02/2020 0820   GLUCOSE 97 03/29/2013 1409   BUN 31 (H) 12/02/2020 0820   BUN 48 (H) 03/29/2013 1409   CREATININE 6.81 (H) 12/02/2020 0820   CREATININE 12.06 (H) 03/29/2013 1409   CALCIUM 8.3 (L) 12/02/2020 0820  CALCIUM 8.8 03/29/2013 1409   PROT 7.1 11/25/2020 0929   ALBUMIN 3.8 11/25/2020 0929   AST 20 11/25/2020 0929   ALT 10 11/25/2020 0929   ALKPHOS 55 11/25/2020 0929   BILITOT 0.9 11/25/2020 0929   GFRNONAA 6 (L) 12/02/2020 0820   GFRNONAA 3 (L) 03/29/2013 1409   GFRAA 3 (L) 01/21/2019 0504   GFRAA 3 (L) 03/29/2013 1409     No results found.     Assessment & Plan:   1. ESRD (end stage renal disease) ([place]) Recommend:  The patient is experiencing increasing problems with their dialysis access.  Patient should have an angiogram with the intention for intervention due to steal syndrome.  The intention for intervention is to restore appropriate flow and prevent thrombosis and possible loss of the access.  As well as improve the quality of dialysis therapy.  The risks, benefits and alternative therapies were reviewed in detail with the patient.  All questions were answered.  The patient agrees to proceed with angio/intervention.      2. Mixed hyperlipidemia Continue statin as ordered and reviewed, no changes at this time   3. Type 2 diabetes mellitus with vascular disease ([place]) Continue hypoglycemic medications as already ordered, these medications have been reviewed and there are no changes at this time.  Hgb A1C to be monitored as already arranged by primary service    Current Outpatient Medications on File Prior to Visit  Medication Sig Dispense Refill   acetaminophen (TYLENOL) 500 MG tablet Take 500 mg by mouth every 6 (six) hours as needed for moderate pain or headache.     amoxicillin-clavulanate (AUGMENTIN) 500-125 MG tablet Take by mouth daily as needed.     aspirin 81 MG tablet Take 81 mg by mouth daily.     cephALEXin (KEFLEX) 500 MG capsule Take 500 mg  by mouth 2 (two) times daily.     cetirizine (ZYRTEC) 10 MG tablet Take 10 mg by mouth daily.     cinacalcet (SENSIPAR) 30 MG tablet Take 30 mg by mouth Every Tuesday,Thursday,and Saturday with dialysis.     clopidogrel (PLAVIX) 75 MG tablet TAKE 1 TABLET BY MOUTH EVERY DAY 90 tablet 1   docusate sodium (COLACE) 100 MG capsule Take 100 mg by mouth daily.     HYDROcodone-acetaminophen (NORCO) 5-325 MG tablet Take 1-2 tablets by mouth every 6 (six) hours as needed for moderate pain or severe pain. 40 tablet 0   latanoprost (XALATAN) 0.005 % ophthalmic solution Place 1 drop into both eyes at bedtime.     midodrine (PROAMATINE) 5 MG tablet Take 1 tablet (5 mg total) by mouth Every Tuesday,Thursday,and Saturday with dialysis. Please give 30 minutes prior to dialysis. Do NOT hold for dialysis. GIVE DOSE DURING DAYLIGHT HOURS ONLY     pantoprazole (PROTONIX) 40 MG tablet Take 40 mg by mouth daily.     pravastatin (PRAVACHOL) 20 MG tablet Take 20 mg by mouth daily.     sevelamer carbonate (RENVELA) 800 MG tablet Take 1,600 mg by mouth 3 (three) times daily with meals.     Timolol Maleate 0.5 % (DAILY) SOLN Place 1 drop into both eyes every morning.     No current facility-administered medications on file prior to visit.    There are no Patient Instructions on file for this visit. No follow-ups on file.   Kris Hartmann, NP

## 2021-06-10 NOTE — H&P (View-Only) (Signed)
Subjective:    Patient ID: Kelli Nguyen, female    DOB: 1946-04-11, 76 y.o.   MRN: [id number] Chief Complaint  Patient presents with   Follow-up    3 Mo Hda    The patient returns to the office for followup of their dialysis access. The function of the access has been stable. The patient denies increased bleeding time or increased recirculation. Patient denies difficulty with cannulation.  She has no issues with arm swelling.  The patient has had history of steal syndrome and notes that her hand is experiencing somewhat more numbness than previous.  The patient denies redness or swelling at the access site. The patient denies fever or chills at home or while on dialysis.  The patient denies amaurosis fugax or recent TIA symptoms. There are no recent neurological changes noted. The patient denies claudication symptoms or rest pain symptoms. The patient denies history of DVT, PE or superficial thrombophlebitis. The patient denies recent episodes of angina or shortness of breath.   Today the patient has a flow volume of 1281.  There is a small pseudoaneurysm noted.  There is no flow detected in the right mid to distal radial artery.  Antegrade flow in the right ulnar artery.       Review of Systems  Neurological:  Positive for weakness and numbness.  All other systems reviewed and are negative.     Objective:   Physical Exam Vitals reviewed.  HENT:     Head: Normocephalic.  Cardiovascular:     Rate and Rhythm: Normal rate.     Pulses:          Radial pulses are 0 on the right side.  Pulmonary:     Effort: Pulmonary effort is normal.  Skin:    General: Skin is dry.  Neurological:     Mental Status: She is alert and oriented to person, place, and time.     Motor: Weakness present.  Psychiatric:        Mood and Affect: Mood normal.        Behavior: Behavior normal.        Thought Content: Thought content normal.        Judgment: Judgment normal.    BP (!) 147/71     Pulse 69    Ht 4\' 11"  (1.499 m)    Wt 153 lb (69.4 kg)    BMI 30.90 kg/m   Past Medical History:  Diagnosis Date   Anemia    Carpal tunnel syndrome of left wrist    Cataract, bilateral    Chronic cough    Closed displaced fracture of left femoral neck ([place]) 03/09/2017   Coronary artery disease    s/p 3v CABG (LIMA-LAD, SVG-OM1, SVG-PDA) in 2008   Diabetes mellitus without complication (HCC)    ESRD (end stage renal disease) on dialysis (HCC)    T-TH-SAT   GERD (gastroesophageal reflux disease)    Grade II diastolic dysfunction    HLD (hyperlipidemia)    Hypertension    Murmur    OA (osteoarthritis)    PAD (peripheral artery disease) (HCC)    Pneumonia    Polyneuropathy    feet and hands   Pulmonary hypertension (HCC)    RBBB    Secondary hyperparathyroidism of renal origin ([place])    Sepsis ([place]) 06/28/2017   Wears dentures    partial upper and lower    Social History   Socioeconomic History   Marital status: Widowed  Spouse name: Not on file   Number of children: 5   Years of education: Not on file   Highest education level: Not on file  Occupational History   Not on file  Tobacco Use   Smoking status: Never   Smokeless tobacco: Never  Vaping Use   Vaping Use: Never used  Substance and Sexual Activity   Alcohol use: No   Drug use: No   Sexual activity: Not on file  Other Topics Concern   Not on file  Social History Narrative   Lives with son   Social Determinants of Health   Financial Resource Strain: Not on file  Food Insecurity: Not on file  Transportation Needs: Not on file  Physical Activity: Not on file  Stress: Not on file  Social Connections: Not on file  Intimate Partner Violence: Not on file    Past Surgical History:  Procedure Laterality Date   A/V SHUNT INTERVENTION N/A 10/16/2016   Procedure: A/V Shunt Intervention;  Surgeon: Katha Cabal, MD;  Location: [place] [facility];  Service: Cardiovascular;  Laterality: N/A;   A/V  SHUNTOGRAM Left 10/16/2016   Procedure: A/V Shuntogram;  Surgeon: Katha Cabal, MD;  Location: [place] [facility];  Service: Cardiovascular;  Laterality: Left;   ABDOMINAL HYSTERECTOMY     CARDIAC CATHETERIZATION N/A 02/26/2014   CARPAL TUNNEL RELEASE Left 09/27/2015   Procedure: CARPAL TUNNEL RELEASE;  Surgeon: Leanor Kail, MD;  Location: [facility];  Service: Orthopedics;  Laterality: Left;   CATARACT EXTRACTION W/PHACO Right 09/23/2016   Procedure: CATARACT EXTRACTION PHACO AND INTRAOCULAR LENS PLACEMENT (IOC) complicated diabetic right;  Surgeon: Ronnell Freshwater, MD;  Location: [place];  Service: Ophthalmology;  Laterality: Right;  Diabetic - diet controlled Potassium draw before surgery   CATARACT EXTRACTION W/PHACO Left 01/20/2017   Procedure: CATARACT EXTRACTION PHACO AND INTRAOCULAR LENS PLACEMENT ([place]) LEFT DIABETIC;  Surgeon: Eulogio Bear, MD;  Location: [place];  Service: Ophthalmology;  Laterality: Left;  Diabetic - diet controlled Potassium draw prior to procedure  8:00 ARRIVAL   COLONOSCOPY     COLONOSCOPY WITH PROPOFOL N/A 11/28/2014   Procedure: COLONOSCOPY WITH PROPOFOL;  Surgeon: Manya Silvas, MD;  Location: [facility];  Service: Endoscopy;  Laterality: N/A;   CORONARY ARTERY BYPASS GRAFT N/A 2008   3v; LIMA-LAD, SVG-OM1, SVG-PDA; Location: [facility]; Surgeon: Jannett Celestine, MD   DIALYSIS/PERMA CATHETER INSERTION Left 01/21/2019   Procedure: DIALYSIS/PERMA CATHETER EXCHANGE;  Surgeon: Algernon Huxley, MD;  Location: [place] [facility];  Service: Cardiovascular;  Laterality: Left;   DIALYSIS/PERMA CATHETER REMOVAL N/A 03/12/2021   Procedure: DIALYSIS/PERMA CATHETER REMOVAL;  Surgeon: Algernon Huxley, MD;  Location: [place] [facility];  Service: Cardiovascular;  Laterality: N/A;   HEMIARTHROPLASTY HIP Left 2018   INSERTION OF DIALYSIS CATHETER Left 01/19/2019   Procedure: INSERTION OF DIALYSIS CATHETER;  Surgeon: Katha Cabal, MD;  Location: [facility];  Service: Vascular;  Laterality: Left;   LIGATIONS OF HERO GRAFT Left 01/19/2019   Procedure: EXCISION LEFT HERO GRAFT;  Surgeon: Katha Cabal, MD;  Location: [facility];  Service: Vascular;  Laterality: Left;   NEPHRECTOMY Left    PERIPHERAL VASCULAR CATHETERIZATION Left 01/18/2015   Procedure: A/V Shuntogram/Fistulagram;  Surgeon: Katha Cabal, MD;  Location: [place] [facility];  Service: Cardiovascular;  Laterality: Left;   PERIPHERAL VASCULAR CATHETERIZATION Left 01/18/2015   Procedure: A/V Shunt Intervention;  Surgeon: Katha Cabal, MD;  Location: [place] [facility];  Service: Cardiovascular;  Laterality: Left;   PERIPHERAL VASCULAR CATHETERIZATION N/A 08/10/2015   Procedure: Thrombectomy;  Surgeon: Algernon Huxley, MD;  Location: [place] [facility];  Service: Cardiovascular;  Laterality: N/A;   PERIPHERAL VASCULAR CATHETERIZATION N/A 08/10/2015   Procedure: A/V Shuntogram/Fistulagram;  Surgeon: Algernon Huxley, MD;  Location: [place] [facility];  Service: Cardiovascular;  Laterality: N/A;   PERIPHERAL VASCULAR CATHETERIZATION N/A 08/10/2015   Procedure: A/V Shunt Intervention;  Surgeon: Algernon Huxley, MD;  Location: [place] [facility];  Service: Cardiovascular;  Laterality: N/A;   PERIPHERAL VASCULAR THROMBECTOMY Left 01/12/2019   Procedure: PERIPHERAL VASCULAR THROMBECTOMY;  Surgeon: Katha Cabal, MD;  Location: [place] [facility];  Service: Cardiovascular;  Laterality: Left;   REMOVAL OF A DIALYSIS CATHETER Right 01/19/2019   Procedure: REMOVAL OF A DIALYSIS CATHETER;  Surgeon: Katha Cabal, MD;  Location: [facility];  Service: Vascular;  Laterality: Right;   RENAL SHUNTS     UPPER EXTREMITY ANGIOGRAPHY Right 12/20/2020   Procedure: UPPER EXTREMITY ANGIOGRAPHY;  Surgeon: Katha Cabal, MD;  Location: [place] [facility];  Service: Cardiovascular;  Laterality: Right;   UPPER EXTREMITY VENOGRAPHY Right 03/20/2020    Procedure: UPPER EXTREMITY VENOGRAPHY;  Surgeon: Algernon Huxley, MD;  Location: [place] [facility];  Service: Cardiovascular;  Laterality: Right;   VASCULAR ACCESS DEVICE INSERTION Right 11/24/2020   Procedure: INSERTION OF HERO VASCULAR ACCESS DEVICE (GRAFT);  Surgeon: Katha Cabal, MD;  Location: [facility];  Service: Vascular;  Laterality: Right;    Family History  Problem Relation Age of Onset   Hypertension Sister     Allergies  Allergen Reactions   Gadolinium Derivatives Hives    Other reaction(s): Unknown Other reaction(s): Other (See Comments) Other reaction(s): Unknown   Penicillins Hives, Itching and Swelling    Has patient had a PCN reaction causing immediate rash, facial/tongue/throat swelling, SOB or lightheadedness with hypotension: No Has patient had a PCN reaction causing severe rash involving mucus membranes or skin necrosis: No Has patient had a PCN reaction that required hospitalization: No Has patient had a PCN reaction occurring within the last 10 years: No If all of the above answers are "NO", then may proceed with Cephalosporin use.  Penicillin allergy assessment completed on 03/22/20. Patient has received and tolerated cepholosporins incuding cefepime, ceftriaxone.    Codeine Hives   Codeine Sulfate Nausea Only   Contrast Media [Iodinated Contrast Media] Hives   Gadobutrol Hives   Metrizamide Hives   Statins Other (See Comments)    muscle pain    CBC Latest Ref Rng & Units 12/02/2020 12/01/2020 11/30/2020  WBC 4.0 - 10.5 K/uL 9.6 8.4 10.1  Hemoglobin 12.0 - 15.0 g/dL 8.7(L) 9.5(L) 7.7(L)  Hematocrit 36.0 - 46.0 % 27.0(L) 28.9(L) 24.2(L)  Platelets 150 - 400 K/uL 203 217 241      CMP     Component Value Date/Time   NA 136 12/02/2020 0820   NA 135 (L) 03/29/2013 1409   K 4.0 12/02/2020 0820   K 4.5 03/29/2013 1409   CL 96 (L) 12/02/2020 0820   CL 97 (L) 03/29/2013 1409   CO2 25 12/02/2020 0820   CO2 27 03/29/2013 1409   GLUCOSE 108 (H)  12/02/2020 0820   GLUCOSE 97 03/29/2013 1409   BUN 31 (H) 12/02/2020 0820   BUN 48 (H) 03/29/2013 1409   CREATININE 6.81 (H) 12/02/2020 0820   CREATININE 12.06 (H) 03/29/2013 1409   CALCIUM 8.3 (L) 12/02/2020 0820  CALCIUM 8.8 03/29/2013 1409   PROT 7.1 11/25/2020 0929   ALBUMIN 3.8 11/25/2020 0929   AST 20 11/25/2020 0929   ALT 10 11/25/2020 0929   ALKPHOS 55 11/25/2020 0929   BILITOT 0.9 11/25/2020 0929   GFRNONAA 6 (L) 12/02/2020 0820   GFRNONAA 3 (L) 03/29/2013 1409   GFRAA 3 (L) 01/21/2019 0504   GFRAA 3 (L) 03/29/2013 1409     No results found.     Assessment & Plan:   1. ESRD (end stage renal disease) ([place]) Recommend:  The patient is experiencing increasing problems with their dialysis access.  Patient should have an angiogram with the intention for intervention due to steal syndrome.  The intention for intervention is to restore appropriate flow and prevent thrombosis and possible loss of the access.  As well as improve the quality of dialysis therapy.  The risks, benefits and alternative therapies were reviewed in detail with the patient.  All questions were answered.  The patient agrees to proceed with angio/intervention.      2. Mixed hyperlipidemia Continue statin as ordered and reviewed, no changes at this time   3. Type 2 diabetes mellitus with vascular disease ([place]) Continue hypoglycemic medications as already ordered, these medications have been reviewed and there are no changes at this time.  Hgb A1C to be monitored as already arranged by primary service    Current Outpatient Medications on File Prior to Visit  Medication Sig Dispense Refill   acetaminophen (TYLENOL) 500 MG tablet Take 500 mg by mouth every 6 (six) hours as needed for moderate pain or headache.     amoxicillin-clavulanate (AUGMENTIN) 500-125 MG tablet Take by mouth daily as needed.     aspirin 81 MG tablet Take 81 mg by mouth daily.     cephALEXin (KEFLEX) 500 MG capsule Take 500 mg  by mouth 2 (two) times daily.     cetirizine (ZYRTEC) 10 MG tablet Take 10 mg by mouth daily.     cinacalcet (SENSIPAR) 30 MG tablet Take 30 mg by mouth Every Tuesday,Thursday,and Saturday with dialysis.     clopidogrel (PLAVIX) 75 MG tablet TAKE 1 TABLET BY MOUTH EVERY DAY 90 tablet 1   docusate sodium (COLACE) 100 MG capsule Take 100 mg by mouth daily.     HYDROcodone-acetaminophen (NORCO) 5-325 MG tablet Take 1-2 tablets by mouth every 6 (six) hours as needed for moderate pain or severe pain. 40 tablet 0   latanoprost (XALATAN) 0.005 % ophthalmic solution Place 1 drop into both eyes at bedtime.     midodrine (PROAMATINE) 5 MG tablet Take 1 tablet (5 mg total) by mouth Every Tuesday,Thursday,and Saturday with dialysis. Please give 30 minutes prior to dialysis. Do NOT hold for dialysis. GIVE DOSE DURING DAYLIGHT HOURS ONLY     pantoprazole (PROTONIX) 40 MG tablet Take 40 mg by mouth daily.     pravastatin (PRAVACHOL) 20 MG tablet Take 20 mg by mouth daily.     sevelamer carbonate (RENVELA) 800 MG tablet Take 1,600 mg by mouth 3 (three) times daily with meals.     Timolol Maleate 0.5 % (DAILY) SOLN Place 1 drop into both eyes every morning.     No current facility-administered medications on file prior to visit.    There are no Patient Instructions on file for this visit. No follow-ups on file.   Kris Hartmann, NP

## 2021-06-12 ENCOUNTER — Telehealth (INDEPENDENT_AMBULATORY_CARE_PROVIDER_SITE_OTHER): Payer: Self-pay

## 2021-06-12 NOTE — Telephone Encounter (Signed)
I attempted to contact the patient to schedule a rue angio with Dr. Delana Meyer and a message was left for a return call.

## 2021-06-13 NOTE — Telephone Encounter (Signed)
Spoke with the patient and she is scheduled with Dr. Delana Meyer for a RUE angio on 06/22/21 with a 11:30 am arrival time to the [facility]. Pre-procedure instructions were discussed and will be mailed.

## 2021-06-22 ENCOUNTER — Other Ambulatory Visit: Payer: Self-pay

## 2021-06-22 ENCOUNTER — Ambulatory Visit
Admission: RE | Admit: 2021-06-22 | Discharge: 2021-06-22 | Disposition: A | Discharge status: Home / Self Care | Payer: Medicare Other | Attending: Vascular Surgery | Admitting: Vascular Surgery

## 2021-06-22 ENCOUNTER — Encounter
Admission: RE | Disposition: A | Discharge status: Home / Self Care | Payer: Self-pay | Source: Home / Self Care | Attending: Vascular Surgery

## 2021-06-22 ENCOUNTER — Other Ambulatory Visit (INDEPENDENT_AMBULATORY_CARE_PROVIDER_SITE_OTHER): Payer: Self-pay | Admitting: Nurse Practitioner

## 2021-06-22 DIAGNOSIS — Y841 Kidney dialysis as the cause of abnormal reaction of the patient, or of later complication, without mention of misadventure at the time of the procedure: Secondary | ICD-10-CM | POA: Diagnosis not present

## 2021-06-22 DIAGNOSIS — E1151 Type 2 diabetes mellitus with diabetic peripheral angiopathy without gangrene: Secondary | ICD-10-CM | POA: Diagnosis not present

## 2021-06-22 DIAGNOSIS — E1122 Type 2 diabetes mellitus with diabetic chronic kidney disease: Secondary | ICD-10-CM | POA: Diagnosis not present

## 2021-06-22 DIAGNOSIS — N186 End stage renal disease: Secondary | ICD-10-CM

## 2021-06-22 DIAGNOSIS — E782 Mixed hyperlipidemia: Secondary | ICD-10-CM | POA: Insufficient documentation

## 2021-06-22 DIAGNOSIS — I700 Atherosclerosis of aorta: Secondary | ICD-10-CM

## 2021-06-22 DIAGNOSIS — I70208 Unspecified atherosclerosis of native arteries of extremities, other extremity: Secondary | ICD-10-CM

## 2021-06-22 DIAGNOSIS — I120 Hypertensive chronic kidney disease with stage 5 chronic kidney disease or end stage renal disease: Secondary | ICD-10-CM | POA: Diagnosis not present

## 2021-06-22 DIAGNOSIS — T82898A Other specified complication of vascular prosthetic devices, implants and grafts, initial encounter: Secondary | ICD-10-CM | POA: Insufficient documentation

## 2021-06-22 DIAGNOSIS — Z992 Dependence on renal dialysis: Secondary | ICD-10-CM

## 2021-06-22 HISTORY — PX: UPPER EXTREMITY ANGIOGRAPHY: CATH118270

## 2021-06-22 LAB — POTASSIUM (ARMC VASCULAR LAB ONLY): Potassium (ARMC vascular lab): 4.1 (ref 3.5–5.1)

## 2021-06-22 LAB — GLUCOSE, CAPILLARY
Glucose-Capillary: 102 mg/dL — ABNORMAL HIGH (ref 70–99)
Glucose-Capillary: 89 mg/dL (ref 70–99)

## 2021-06-22 SURGERY — UPPER EXTREMITY ANGIOGRAPHY
Anesthesia: Moderate Sedation | Laterality: Right

## 2021-06-22 MED ORDER — MIDAZOLAM HCL 2 MG/ML PO SYRP
8.0000 mg | ORAL_SOLUTION | Freq: Once | ORAL | Status: DC | PRN
Start: 2021-06-22 — End: 2021-06-22

## 2021-06-22 MED ORDER — FENTANYL CITRATE PF 50 MCG/ML IJ SOSY
PREFILLED_SYRINGE | INTRAMUSCULAR | Status: AC
  Filled 2021-06-22: qty 2

## 2021-06-22 MED ORDER — SODIUM CHLORIDE 0.9% FLUSH
3.0000 mL | INTRAVENOUS | Status: DC | PRN
Start: 2021-06-22 — End: 2021-06-22

## 2021-06-22 MED ORDER — FENTANYL CITRATE PF 50 MCG/ML IJ SOSY
PREFILLED_SYRINGE | INTRAMUSCULAR | Status: AC
  Filled 2021-06-22: qty 1

## 2021-06-22 MED ORDER — MIDAZOLAM HCL 2 MG/2ML IJ SOLN
INTRAMUSCULAR | Status: DC | PRN
  Administered 2021-06-22: 1 mg via INTRAVENOUS
  Administered 2021-06-22: 2 mg via INTRAVENOUS
  Administered 2021-06-22: .5 mg via INTRAVENOUS

## 2021-06-22 MED ORDER — MIDAZOLAM HCL 5 MG/5ML IJ SOLN
INTRAMUSCULAR | Status: AC
  Filled 2021-06-22: qty 5

## 2021-06-22 MED ORDER — ONDANSETRON HCL 4 MG/2ML IJ SOLN: 4.0000 mg | Freq: Four times a day (QID) | INTRAMUSCULAR | Status: DC | PRN

## 2021-06-22 MED ORDER — HEPARIN SODIUM (PORCINE) 1000 UNIT/ML IJ SOLN
INTRAMUSCULAR | Status: DC | PRN
  Administered 2021-06-22: 4000 [IU] via INTRAVENOUS

## 2021-06-22 MED ORDER — FENTANYL CITRATE (PF) 100 MCG/2ML IJ SOLN: 12.5000 ug | Freq: Once | INTRAMUSCULAR | Status: DC | PRN

## 2021-06-22 MED ORDER — FENTANYL CITRATE (PF) 100 MCG/2ML IJ SOLN
INTRAMUSCULAR | Status: DC | PRN
  Administered 2021-06-22: 50 ug via INTRAVENOUS
  Administered 2021-06-22 (×2): 25 ug via INTRAVENOUS

## 2021-06-22 MED ORDER — DIPHENHYDRAMINE HCL 50 MG/ML IJ SOLN
INTRAMUSCULAR | Status: AC
  Filled 2021-06-22: qty 1

## 2021-06-22 MED ORDER — HEPARIN SODIUM (PORCINE) 1000 UNIT/ML IJ SOLN
INTRAMUSCULAR | Status: AC
  Filled 2021-06-22: qty 10

## 2021-06-22 MED ORDER — FAMOTIDINE 20 MG PO TABS
ORAL_TABLET | ORAL | Status: AC
  Filled 2021-06-22: qty 2

## 2021-06-22 MED ORDER — METHYLPREDNISOLONE SODIUM SUCC 125 MG IJ SOLR
125.0000 mg | Freq: Once | INTRAMUSCULAR | Status: AC | PRN
Start: 2021-06-22 — End: 2021-06-22
  Administered 2021-06-22: 125 mg via INTRAVENOUS

## 2021-06-22 MED ORDER — MORPHINE SULFATE (PF) 4 MG/ML IV SOLN: 2.0000 mg | INTRAVENOUS | Status: DC | PRN

## 2021-06-22 MED ORDER — HYDRALAZINE HCL 20 MG/ML IJ SOLN: 5.0000 mg | INTRAMUSCULAR | Status: DC | PRN

## 2021-06-22 MED ORDER — FAMOTIDINE 20 MG PO TABS
40.0000 mg | ORAL_TABLET | Freq: Once | ORAL | Status: AC | PRN
Start: 2021-06-22 — End: 2021-06-22
  Administered 2021-06-22: 40 mg via ORAL

## 2021-06-22 MED ORDER — LABETALOL HCL 5 MG/ML IV SOLN: 10.0000 mg | INTRAVENOUS | Status: DC | PRN

## 2021-06-22 MED ORDER — IODIXANOL 320 MG/ML IV SOLN
INTRAVENOUS | Status: DC | PRN
  Administered 2021-06-22: 50 mL

## 2021-06-22 MED ORDER — SODIUM CHLORIDE 0.9 % IV SOLN: INTRAVENOUS | Status: DC

## 2021-06-22 MED ORDER — CLINDAMYCIN PHOSPHATE 300 MG/50ML IV SOLN
300.0000 mg | Freq: Once | INTRAVENOUS | Status: DC
Start: 2021-06-22 — End: 2021-06-22

## 2021-06-22 MED ORDER — OXYCODONE HCL 5 MG PO TABS: 5.0000 mg | ORAL_TABLET | ORAL | Status: DC | PRN

## 2021-06-22 MED ORDER — SODIUM CHLORIDE 0.9% FLUSH: 3.0000 mL | Freq: Two times a day (BID) | INTRAVENOUS | Status: DC

## 2021-06-22 MED ORDER — ACETAMINOPHEN 325 MG PO TABS: 650.0000 mg | ORAL_TABLET | ORAL | Status: DC | PRN

## 2021-06-22 MED ORDER — DIPHENHYDRAMINE HCL 50 MG/ML IJ SOLN
50.0000 mg | Freq: Once | INTRAMUSCULAR | Status: AC | PRN
Start: 2021-06-22 — End: 2021-06-22
  Administered 2021-06-22: 50 mg via INTRAVENOUS

## 2021-06-22 MED ORDER — SODIUM CHLORIDE 0.9 % IV SOLN: 250.0000 mL | INTRAVENOUS | Status: DC | PRN

## 2021-06-22 MED ORDER — METHYLPREDNISOLONE SODIUM SUCC 125 MG IJ SOLR
INTRAMUSCULAR | Status: AC
  Filled 2021-06-22: qty 2

## 2021-06-22 MED ORDER — CLINDAMYCIN PHOSPHATE 300 MG/50ML IV SOLN
INTRAVENOUS | Status: AC
  Administered 2021-06-22: 300 mg
  Filled 2021-06-22: qty 50

## 2021-06-22 SURGICAL SUPPLY — 22 items
BALLN ULTRVRSE 2.5X150X150 (BALLOONS) ×2
BALLOON ULTRVRSE 2.5X150X150 (BALLOONS) IMPLANT
CANNULA 5F STIFF (CANNULA) ×1 IMPLANT
CATH ANGIO 5F PIGTAIL 100CM (CATHETERS) ×1 IMPLANT
CATH BEACON 5 .035 100 H1 TIP (CATHETERS) ×1 IMPLANT
CATH BERNSTEIN 5FR 130CM (CATHETERS) ×1 IMPLANT
CATH NAVICROSS ANGLED 135CM (MICROCATHETER) ×1 IMPLANT
COVER PROBE U/S 5X48 (MISCELLANEOUS) ×1 IMPLANT
DEVICE SAFEGUARD 24CM (GAUZE/BANDAGES/DRESSINGS) ×1 IMPLANT
DEVICE STARCLOSE SE CLOSURE (Vascular Products) ×1 IMPLANT
DEVICE TORQUE .025-.038 (MISCELLANEOUS) ×1 IMPLANT
GLIDEWIRE ADV .014X300CM (WIRE) ×1 IMPLANT
GLIDEWIRE ANGLED SS 035X260CM (WIRE) ×1 IMPLANT
KIT ENCORE 26 ADVANTAGE (KITS) ×1 IMPLANT
PACK ANGIOGRAPHY (CUSTOM PROCEDURE TRAY) ×1 IMPLANT
SHEATH BRITE TIP 5FRX11 (SHEATH) ×1 IMPLANT
SHEATH SHUTTLE 6FR (SHEATH) ×1 IMPLANT
SYR MEDRAD MARK 7 150ML (SYRINGE) ×1 IMPLANT
TUBING CONTRAST HIGH PRESS 72 (TUBING) ×1 IMPLANT
VALVE CHECKFLO PERFORMER (SHEATH) ×1 IMPLANT
WIRE GUIDERIGHT .035X150 (WIRE) ×1 IMPLANT
WIRE HI TORQ VERSACORE 300 (WIRE) ×1 IMPLANT

## 2021-06-22 NOTE — Interval H&P Note (Signed)
History and Physical Interval Note:  06/22/2021 12:20 PM  Kelli Nguyen  has presented today for surgery, with the diagnosis of RUE Angio   End Stage Renal.  The various methods of treatment have been discussed with the patient and family. After consideration of risks, benefits and other options for treatment, the patient has consented to  Procedure(s): Upper Extremity Angiography (Right) as a surgical intervention.  The patient's history has been reviewed, patient examined, no change in status, stable for surgery.  I have reviewed the patient's chart and labs.  Questions were answered to the patient's satisfaction.     Hortencia Pilar

## 2021-06-22 NOTE — Progress Notes (Signed)
Dr. Delana Meyer at bedside, speaking with pt. And her daughter re: procedural results. Both verbalize understanding of conversation.

## 2021-06-22 NOTE — Op Note (Signed)
[name]  Percutaneous Study/Intervention Procedural Note   Date of Surgery: 06/22/2021,1:56 PM  Surgeon: Hortencia Pilar  Pre-operative Diagnosis: Steal syndrome secondary to right arm hero graft; end-stage renal disease requiring hemodialysis  Post-operative diagnosis:  Same  Procedure(s) Performed:  1.  Arch aortogram  2.  Right upper extremity angiography third order catheter placement  3.  Percutaneous transluminal angioplasty to 2 mm right ulnar artery  4.  StarClose right common femoral artery  5  Anesthesia: Conscious sedation was administered by the interventional radiology RN under my direct supervision. IV Versed plus fentanyl were utilized. Continuous ECG, pulse oximetry and blood pressure was monitored throughout the entire procedure.  Conscious sedation was administered for a total of 51 minutes and 36 seconds.  Sheath: 90 cm shuttle sheath right common femoral retrograde  Contrast: 50 cc   Fluoroscopy Time: 9 minutes  Indications: Patient is well-known to the office returned for a routine evaluation complaining of increasing hand pain.  Noninvasive studies as well as physical examination suggested steal phenomena secondary to a right arm hero graft.  Risks and benefits for angiography with hope for intervention to resolve the steal syndrome are reviewed with the patient all questions were answered patient agrees to proceed with angiography and possible intervention.  Procedure:  Kelli Nguyen is a 76 y.o. female who was identified and appropriate procedural time out was performed.  The patient was then placed supine on the table and prepped and draped in the usual sterile fashion.    Ultrasound was used to evaluate the right common femoral artery.  It is echolucent and pulsatile indicating it is patent .   A micropuncture needle was used to access the right common femoral artery under direct ultrasound guidance and an image was recorded for  the permanent record.  A microwire was then advanced under fluoroscopic guidance followed by the micro-sheath.  A 0.035 J wire was advanced without resistance and a 5Fr sheath was placed.    Pigtail catheter was then advanced to the level of ascending aorta and and LAO projection of the aortic arch was obtained. The pigtail catheter was exchanged for a H1 catheter. The right innominate artery was then selected and the catheter and wire were advanced. Hand injection of contrast was then used to create images of the subclavian axillary and brachial arteries was obtained.  Diagnostic interpretation: Patient has a type I arch although it is somewhat flattened and splayed out.  There is diffuse calcification but there is no significant atherosclerotic plaque formation.  The origins of the great vessels show normal anatomy and a widely patent.  The right innominate artery, visualized portions of the right common carotid as well as the right subclavian arm artery are all widely patent.  The right axillary and brachial arteries are also calcified but widely patent.  Injection of contrast proximal to the hero graft anastomosis demonstrates virtually all contrast is flowing into the graft with very little contrast passing beyond the anastomosis and on reaching the wrist suggesting steal syndrome.  An Unna leak angle of the origin/anastomosis of the hero graft is widely patent although it did have the appearance that its been banded by approximately 30% in the past.  Distal to the anastomosis the distal brachial artery is widely patent the trifurcation is well visualized and patent with the radial artery being primary filling of the hand all the way down to the palmar arch and the interosseous being present and exhibiting typical anatomy.  The ulnar is occluded from approximately 4 cm distal to the origin all the way to the level of the wrist where it is reconstituted and does communicate with palmar arch.  4000 units  of heparin was then given and the 5 French sheath was exchanged for a 90 cm shuttle sheath.  Glidewire and catheter were then negotiated distally. And the Glidewire was exchanged for a versa core wire.  Using a catheter the origin of the ulnar artery is engaged and a 0.014 wire advanced through the occluded ulnar artery all the way down into the palmar arch.  A 2 mm x 150 mm Ultraverse balloon is then advanced so that the tip is located within the reconstituted segment inflation is to 8 atm for 1 minute a second serial inflation is performed more proximally to 10 atm for approximately 1 minute.  A Kumpe catheter with a Touhy Eino [employer]ber is then advanced over the 0.014 wire and selective injection of the ulnar demonstrates the ulnar is now widely patent with less than 5% residual stenosis throughout its course..  After review of the images the catheter was removed over wire and an RAO view of the groin was obtained. StarClose device was deployed without difficulty.  Findings:  Patient has a type I arch although it is somewhat flattened and splayed out.  There is diffuse calcification but there is no significant atherosclerotic plaque formation.  The origins of the great vessels show normal anatomy and a widely patent.  The right innominate artery, visualized portions of the right common carotid as well as the right subclavian arm artery are all widely patent.  The right axillary and brachial arteries are also calcified but widely patent.  Injection of contrast proximal to the hero graft anastomosis demonstrates virtually all contrast is flowing into the graft with very little contrast passing beyond the anastomosis and on reaching the wrist suggesting steal syndrome.  An Unna leak angle of the origin/anastomosis of the hero graft is widely patent although it did have the appearance that its been banded by approximately 30% in the past.  Distal to the anastomosis the distal brachial artery is widely patent the  trifurcation is well visualized and patent with the radial artery being primary filling of the hand all the way down to the palmar arch and the interosseous being present and exhibiting typical anatomy.  The ulnar is occluded from approximately 4 cm distal to the origin all the way to the level of the wrist where it is reconstituted and does communicate with palmar arch.  Following angioplasty of the ulnar artery 2 mm now widely patent with less than 5% residual stenosis.  Moderate to severe steal is still noted on proximal injection of the brachial artery and it will be recommended to the patient to undergo further banding at the origin of the hero graft which is a surgical procedure.    Disposition: Patient was taken to the recovery room in stable condition having tolerated the procedure well.  Belenda Cruise Oniel Meleski 06/22/2021,1:56 PM

## 2021-06-25 ENCOUNTER — Encounter: Payer: Self-pay | Admitting: Vascular Surgery

## 2021-07-09 ENCOUNTER — Ambulatory Visit (INDEPENDENT_AMBULATORY_CARE_PROVIDER_SITE_OTHER): Payer: Medicare Other | Admitting: Vascular Surgery

## 2021-07-09 ENCOUNTER — Encounter (INDEPENDENT_AMBULATORY_CARE_PROVIDER_SITE_OTHER): Payer: Self-pay | Admitting: Vascular Surgery

## 2021-07-09 ENCOUNTER — Other Ambulatory Visit: Payer: Self-pay

## 2021-07-09 VITALS — BP 154/74 | HR 76 | Resp 16 | Wt 153.8 lb

## 2021-07-09 DIAGNOSIS — N186 End stage renal disease: Secondary | ICD-10-CM

## 2021-07-09 DIAGNOSIS — T829XXS Unspecified complication of cardiac and vascular prosthetic device, implant and graft, sequela: Secondary | ICD-10-CM | POA: Diagnosis not present

## 2021-07-09 DIAGNOSIS — I10 Essential (primary) hypertension: Secondary | ICD-10-CM

## 2021-07-09 DIAGNOSIS — E1159 Type 2 diabetes mellitus with other circulatory complications: Secondary | ICD-10-CM

## 2021-07-09 DIAGNOSIS — I25118 Atherosclerotic heart disease of native coronary artery with other forms of angina pectoris: Secondary | ICD-10-CM

## 2021-07-09 NOTE — H&P (View-Only) (Signed)
? ? ?MRN : [id number] ? ?Kelli Nguyen is a 76 y.o. (December 25, 1945) female who presents with chief complaint of check av access. ? ?History of Present Illness:  ? ?The patient returns to the office for followup status post intervention of the dialysis access right arm hero graft.   ? ?Procedure 06/22/2021: ?Right upper extremity angiography third order catheter placement ?Percutaneous transluminal angioplasty to 2 mm right ulnar artery ? ?Following the intervention the excess function was unchanged per the patient.  The patient continues to be experiencing increased bleeding times following decannulation and increased recirculation with diminished efficiency of their dialysis. The patient denies an increase in arm swelling. At the present time the patient denies hand pain. ? ?The patient denies amaurosis fugax or recent TIA symptoms. There are no recent neurological changes noted. ?The patient denies claudication symptoms or rest pain symptoms. ?The patient denies history of DVT, PE or superficial thrombophlebitis. ?The patient denies recent episodes of angina or shortness of breath.   ? ?Current Meds  ?Medication Sig  ? acetaminophen (TYLENOL) 500 MG tablet Take 500 mg by mouth every 6 (six) hours as needed for moderate pain or headache.  ? aspirin 81 MG tablet Take 81 mg by mouth daily.  ? cinacalcet (SENSIPAR) 30 MG tablet Take 30 mg by mouth Every Tuesday,Thursday,and Saturday with dialysis.  ? clopidogrel (PLAVIX) 75 MG tablet TAKE 1 TABLET BY MOUTH EVERY DAY  ? diclofenac Sodium (VOLTAREN) 1 % GEL Apply topically.  ? docusate sodium (COLACE) 100 MG capsule Take 100 mg by mouth daily.  ? latanoprost (XALATAN) 0.005 % ophthalmic solution Place 1 drop into both eyes at bedtime.  ? lidocaine (LIDODERM) 5 % SMARTSIG:Topical  ? midodrine (PROAMATINE) 5 MG tablet Take 1 tablet (5 mg total) by mouth Every Tuesday,Thursday,and Saturday with dialysis. Please give 30 minutes prior to dialysis. Do NOT hold for dialysis. GIVE  DOSE DURING DAYLIGHT HOURS ONLY  ? pantoprazole (PROTONIX) 40 MG tablet Take 40 mg by mouth daily.  ? pravastatin (PRAVACHOL) 20 MG tablet Take 20 mg by mouth daily.  ? sevelamer carbonate (RENVELA) 800 MG tablet Take 1,600 mg by mouth 3 (three) times daily with meals.  ? Timolol Maleate 0.5 % (DAILY) SOLN Place 1 drop into both eyes every morning.  ? ? ?Past Medical History:  ?Diagnosis Date  ? Anemia   ? Carpal tunnel syndrome of left wrist   ? Cataract, bilateral   ? Chronic cough   ? Closed displaced fracture of left femoral neck ([place]) 03/09/2017  ? Coronary artery disease   ? s/p 3v CABG (LIMA-LAD, SVG-OM1, SVG-PDA) in 2008  ? Diabetes mellitus without complication ([place])   ? ESRD (end stage renal disease) on dialysis [facility])   ? T-TH-SAT  ? GERD (gastroesophageal reflux disease)   ? Grade II diastolic dysfunction   ? HLD (hyperlipidemia)   ? Hypertension   ? Murmur   ? OA (osteoarthritis)   ? PAD (peripheral artery disease) ([place])   ? Pneumonia   ? Polyneuropathy   ? feet and hands  ? Pulmonary hypertension ([place])   ? RBBB   ? Secondary hyperparathyroidism of renal origin [facility])   ? Sepsis ([place]) 06/28/2017  ? Wears dentures   ? partial upper and lower  ? ? ?Past Surgical History:  ?Procedure Laterality Date  ? A/V SHUNT INTERVENTION N/A 10/16/2016  ? Procedure: A/V Shunt Intervention;  Surgeon: Katha Cabal, MD;  Location: [place] [facility];  Service: Cardiovascular;  Laterality: N/A;  ?  A/V SHUNTOGRAM Left 10/16/2016  ? Procedure: A/V Shuntogram;  Surgeon: Katha Cabal, MD;  Location: [place] [facility] LAB;  Service: Cardiovascular;  Laterality: Left;  ? ABDOMINAL HYSTERECTOMY    ? CARDIAC CATHETERIZATION N/A 02/26/2014  ? CARPAL TUNNEL RELEASE Left 09/27/2015  ? Procedure: CARPAL TUNNEL RELEASE;  Surgeon: Leanor Kail, MD;  Location: [facility] ORS;  Service: Orthopedics;  Laterality: Left;  ? CATARACT EXTRACTION W/PHACO Right 09/23/2016  ? Procedure: CATARACT EXTRACTION PHACO AND INTRAOCULAR LENS  PLACEMENT (IOC) complicated diabetic right;  Surgeon: Ronnell Freshwater, MD;  Location: [place];  Service: Ophthalmology;  Laterality: Right;  Diabetic - diet controlled ?Potassium draw before surgery  ? CATARACT EXTRACTION W/PHACO Left 01/20/2017  ? Procedure: CATARACT EXTRACTION PHACO AND INTRAOCULAR LENS PLACEMENT ([place]) LEFT DIABETIC;  Surgeon: Eulogio Bear, MD;  Location: [place];  Service: Ophthalmology;  Laterality: Left;  Diabetic - diet controlled ?Potassium draw prior to procedure  8:00 ARRIVAL  ? COLONOSCOPY    ? COLONOSCOPY WITH PROPOFOL N/A 11/28/2014  ? Procedure: COLONOSCOPY WITH PROPOFOL;  Surgeon: Manya Silvas, MD;  Location: [facility] ENDOSCOPY;  Service: Endoscopy;  Laterality: N/A;  ? CORONARY ARTERY BYPASS GRAFT N/A 2008  ? 3v; LIMA-LAD, SVG-OM1, SVG-PDA; Location: [facility]; Surgeon: Jannett Celestine, MD  ? DIALYSIS/PERMA CATHETER INSERTION Left 01/21/2019  ? Procedure: DIALYSIS/PERMA CATHETER EXCHANGE;  Surgeon: Algernon Huxley, MD;  Location: [place] CV LAB;  Service: Cardiovascular;  Laterality: Left;  ? DIALYSIS/PERMA CATHETER REMOVAL N/A 03/12/2021  ? Procedure: DIALYSIS/PERMA CATHETER REMOVAL;  Surgeon: Algernon Huxley, MD;  Location: [place] CV LAB;  Service: Cardiovascular;  Laterality: N/A;  ? HEMIARTHROPLASTY HIP Left 2018  ? INSERTION OF DIALYSIS CATHETER Left 01/19/2019  ? Procedure: INSERTION OF DIALYSIS CATHETER;  Surgeon: Katha Cabal, MD;  Location: [facility]S;  Service: Vascular;  Laterality: Left;  ? LIGATIONS OF HERO GRAFT Left 01/19/2019  ? Procedure: EXCISION LEFT HERO GRAFT;  Surgeon: Katha Cabal, MD;  Location: [facility] ORS;  Service: Vascular;  Laterality: Left;  ? NEPHRECTOMY Left   ? PERIPHERAL VASCULAR CATHETERIZATION Left 01/18/2015  ? Procedure: A/V Shuntogram/Fistulagram;  Surgeon: Katha Cabal, MD;  Location: [place] CV LAB;  Service: Cardiovascular;  Laterality: Left;  ? PERIPHERAL VASCULAR CATHETERIZATION Left  01/18/2015  ? Procedure: A/V Shunt Intervention;  Surgeon: Katha Cabal, MD;  Location: [place] CV LAB;  Service: Cardiovascular;  Laterality: Left;  ? PERIPHERAL VASCULAR CATHETERIZATION N/A 08/10/2015  ? Procedure: Thrombectomy;  Surgeon: Algernon Huxley, MD;  Location: [place] CV LAB;  Service: Cardiovascular;  Laterality: N/A;  ? PERIPHERAL VASCULAR CATHETERIZATION N/A 08/10/2015  ? Procedure: A/V Shuntogram/Fistulagram;  Surgeon: Algernon Huxley, MD;  Location: [place] CV LAB;  Service: Cardiovascular;  Laterality: N/A;  ? PERIPHERAL VASCULAR CATHETERIZATION N/A 08/10/2015  ? Procedure: A/V Shunt Intervention;  Surgeon: Algernon Huxley, MD;  Location: [place] CV LAB;  Service: Cardiovascular;  Laterality: N/A;  ? PERIPHERAL VASCULAR THROMBECTOMY Left 01/12/2019  ? Procedure: PERIPHERAL VASCULAR THROMBECTOMY;  Surgeon: Katha Cabal, MD;  Location: [place] [facility] LAB;  Service: Cardiovascular;  Laterality: Left;  ? REMOVAL OF A DIALYSIS CATHETER Right 01/19/2019  ? Procedure: REMOVAL OF A DIALYSIS CATHETER;  Surgeon: Katha Cabal, MD;  Location: [facility];  Service: Vascular;  Laterality: Right;  ? RENAL SHUNTS    ? UPPER EXTREMITY ANGIOGRAPHY Right 12/20/2020  ? Procedure: UPPER EXTREMITY ANGIOGRAPHY;  Surgeon: Katha Cabal, MD;  Location[facility] INVASIVE CV  LAB;  Service: Cardiovascular;  Laterality: Right;  ? UPPER EXTREMITY ANGIOGRAPHY Right 06/22/2021  ? Procedure: Upper Extremity Angiography;  Surgeon: Katha Cabal, MD;  Location: [place] CV LAB;  Service: Cardiovascular;  Laterality: Right;  ? UPPER EXTREMITY VENOGRAPHY Right 03/20/2020  ? Procedure: UPPER EXTREMITY VENOGRAPHY;  Surgeon: Algernon Huxley, MD;  Location: [place] CV LAB;  Service: Cardiovascular;  Laterality: Right;  ? VASCULAR ACCESS DEVICE INSERTION Right 11/24/2020  ? Procedure: INSERTION OF HERO VASCULAR ACCESS DEVICE (GRAFT);  Surgeon: Katha Cabal, MD;  Location: [facility] ORS;  Service:  Vascular;  Laterality: Right;  ? ? ?Social History ?Social History  ? ?Tobacco Use  ? Smoking status: Never  ? Smokeless tobacco: Never  ?Vaping Use  ? Vaping Use: Never used  ?Substance Use Topics  ? Alcohol

## 2021-07-09 NOTE — Progress Notes (Signed)
MRN : [id number]  Kelli Nguyen is a 76 y.o. (Jul 30, 1945) female who presents with chief complaint of check av access.  History of Present Illness:   The patient returns to the office for followup status post intervention of the dialysis access right arm hero graft.    Procedure 06/22/2021: Right upper extremity angiography third order catheter placement Percutaneous transluminal angioplasty to 2 mm right ulnar artery  Following the intervention the excess function was unchanged per the patient.  The patient continues to be experiencing increased bleeding times following decannulation and increased recirculation with diminished efficiency of their dialysis. The patient denies an increase in arm swelling. At the present time the patient denies hand pain.  The patient denies amaurosis fugax or recent TIA symptoms. There are no recent neurological changes noted. The patient denies claudication symptoms or rest pain symptoms. The patient denies history of DVT, PE or superficial thrombophlebitis. The patient denies recent episodes of angina or shortness of breath.    Current Meds  Medication Sig   acetaminophen (TYLENOL) 500 MG tablet Take 500 mg by mouth every 6 (six) hours as needed for moderate pain or headache.   aspirin 81 MG tablet Take 81 mg by mouth daily.   cinacalcet (SENSIPAR) 30 MG tablet Take 30 mg by mouth Every Tuesday,Thursday,and Saturday with dialysis.   clopidogrel (PLAVIX) 75 MG tablet TAKE 1 TABLET BY MOUTH EVERY DAY   diclofenac Sodium (VOLTAREN) 1 % GEL Apply topically.   docusate sodium (COLACE) 100 MG capsule Take 100 mg by mouth daily.   latanoprost (XALATAN) 0.005 % ophthalmic solution Place 1 drop into both eyes at bedtime.   lidocaine (LIDODERM) 5 % SMARTSIG:Topical   midodrine (PROAMATINE) 5 MG tablet Take 1 tablet (5 mg total) by mouth Every Tuesday,Thursday,and Saturday with dialysis. Please give 30 minutes prior to dialysis. Do NOT hold for dialysis. GIVE  DOSE DURING DAYLIGHT HOURS ONLY   pantoprazole (PROTONIX) 40 MG tablet Take 40 mg by mouth daily.   pravastatin (PRAVACHOL) 20 MG tablet Take 20 mg by mouth daily.   sevelamer carbonate (RENVELA) 800 MG tablet Take 1,600 mg by mouth 3 (three) times daily with meals.   Timolol Maleate 0.5 % (DAILY) SOLN Place 1 drop into both eyes every morning.    Past Medical History:  Diagnosis Date   Anemia    Carpal tunnel syndrome of left wrist    Cataract, bilateral    Chronic cough    Closed displaced fracture of left femoral neck ([place]) 03/09/2017   Coronary artery disease    s/p 3v CABG (LIMA-LAD, SVG-OM1, SVG-PDA) in 2008   Diabetes mellitus without complication (HCC)    ESRD (end stage renal disease) on dialysis ([place])    T-TH-SAT   GERD (gastroesophageal reflux disease)    Grade II diastolic dysfunction    HLD (hyperlipidemia)    Hypertension    Murmur    OA (osteoarthritis)    PAD (peripheral artery disease) (HCC)    Pneumonia    Polyneuropathy    feet and hands   Pulmonary hypertension ([place])    RBBB    Secondary hyperparathyroidism of renal origin ([place])    Sepsis ([place]) 06/28/2017   Wears dentures    partial upper and lower    Past Surgical History:  Procedure Laterality Date   A/V SHUNT INTERVENTION N/A 10/16/2016   Procedure: A/V Shunt Intervention;  Surgeon: Katha Cabal, MD;  Location: [place] [facility];  Service: Cardiovascular;  Laterality: N/A;  A/V SHUNTOGRAM Left 10/16/2016   Procedure: A/V Shuntogram;  Surgeon: Katha Cabal, MD;  Location: [place] [facility];  Service: Cardiovascular;  Laterality: Left;   ABDOMINAL HYSTERECTOMY     CARDIAC CATHETERIZATION N/A 02/26/2014   CARPAL TUNNEL RELEASE Left 09/27/2015   Procedure: CARPAL TUNNEL RELEASE;  Surgeon: Leanor Kail, MD;  Location: [facility] OR[facility];  Service: Orthopedics;  Laterality: Left;   CATARACT EXTRACTION W/PHACO Right 09/23/2016   Procedure: CATARACT EXTRACTION PHACO AND INTRAOCULAR LENS  PLACEMENT (I[facility]) complicated diabetic right;  Surgeon: Ronnell Freshwater, MD;  Location: [place];  Service: Ophthalmology;  Laterality: Right;  Diabetic - diet controlled Potassium draw before surgery   CATARACT EXTRACTION W/PHACO Left 01/20/2017   Procedure: CATARACT EXTRACTION PHACO AND INTRAOCULAR LENS PLACEMENT ([place]) LEFT DIABETIC;  Surgeon: Eulogio Bear, MD;  Location: [place];  Service: Ophthalmology;  Laterality: Left;  Diabetic - diet controlled Potassium draw prior to procedure  8:00 ARRIVAL   COLONOSCOPY     COLONOSCOPY WITH PROPOFOL N/A 11/28/2014   Procedure: COLONOSCOPY WITH PROPOFOL;  Surgeon: Manya Silvas, MD;  Location: [facility] ENDOSCOPY;  Service: Endoscopy;  Laterality: N/A;   CORONARY ARTERY BYPASS GRAFT N/A 2008   3v; LIMA-LAD, SVG-OM1, SVG-PDA; Location: [facility]; Surgeon: Jannett Celestine, MD   DIALYSIS/PERMA CATHETER INSERTION Left 01/21/2019   Procedure: DIALYSIS/PERMA CATHETER EXCHANGE;  Surgeon: Algernon Huxley, MD;  Location: [place] [facility] LAB;  Service: Cardiovascular;  Laterality: Left;   DIALYSIS/PERMA CATHETER REMOVAL N/A 03/12/2021   Procedure: DIALYSIS/PERMA CATHETER REMOVAL;  Surgeon: Algernon Huxley, MD;  Location: [place] [facility] LAB;  Service: Cardiovascular;  Laterality: N/A;   HEMIARTHROPLASTY HIP Left 2018   INSERTION OF DIALYSIS CATHETER Left 01/19/2019   Procedure: INSERTION OF DIALYSIS CATHETER;  Surgeon: Katha Cabal, MD;  Location: [facility];  Service: Vascular;  Laterality: Left;   LIGATIONS OF HERO GRAFT Left 01/19/2019   Procedure: EXCISION LEFT HERO GRAFT;  Surgeon: Katha Cabal, MD;  Location: [facility];  Service: Vascular;  Laterality: Left;   NEPHRECTOMY Left    PERIPHERAL VASCULAR CATHETERIZATION Left 01/18/2015   Procedure: A/V Shuntogram/Fistulagram;  Surgeon: Katha Cabal, MD;  Location: [place] [facility] LAB;  Service: Cardiovascular;  Laterality: Left;   PERIPHERAL VASCULAR CATHETERIZATION Left  01/18/2015   Procedure: A/V Shunt Intervention;  Surgeon: Katha Cabal, MD;  Location: [place] [facility];  Service: Cardiovascular;  Laterality: Left;   PERIPHERAL VASCULAR CATHETERIZATION N/A 08/10/2015   Procedure: Thrombectomy;  Surgeon: Algernon Huxley, MD;  Location: [place] [facility];  Service: Cardiovascular;  Laterality: N/A;   PERIPHERAL VASCULAR CATHETERIZATION N/A 08/10/2015   Procedure: A/V Shuntogram/Fistulagram;  Surgeon: Algernon Huxley, MD;  Location: [place] [facility];  Service: Cardiovascular;  Laterality: N/A;   PERIPHERAL VASCULAR CATHETERIZATION N/A 08/10/2015   Procedure: A/V Shunt Intervention;  Surgeon: Algernon Huxley, MD;  Location: [place] [facility] LAB;  Service: Cardiovascular;  Laterality: N/A;   PERIPHERAL VASCULAR THROMBECTOMY Left 01/12/2019   Procedure: PERIPHERAL VASCULAR THROMBECTOMY;  Surgeon: Katha Cabal, MD;  Location: [place] [facility];  Service: Cardiovascular;  Laterality: Left;   REMOVAL OF A DIALYSIS CATHETER Right 01/19/2019   Procedure: REMOVAL OF A DIALYSIS CATHETER;  Surgeon: Katha Cabal, MD;  Location: [facility];  Service: Vascular;  Laterality: Right;   RENAL SHUNTS     UPPER EXTREMITY ANGIOGRAPHY Right 12/20/2020   Procedure: UPPER EXTREMITY ANGIOGRAPHY;  Surgeon: Katha Cabal, MD;  Location: [place] INVASIVE CV  LAB;  Service: Cardiovascular;  Laterality: Right;   UPPER EXTREMITY ANGIOGRAPHY Right 06/22/2021   Procedure: Upper Extremity Angiography;  Surgeon: Katha Cabal, MD;  Location: [place] [facility] LAB;  Service: Cardiovascular;  Laterality: Right;   UPPER EXTREMITY VENOGRAPHY Right 03/20/2020   Procedure: UPPER EXTREMITY VENOGRAPHY;  Surgeon: Algernon Huxley, MD;  Location: [place] [facility];  Service: Cardiovascular;  Laterality: Right;   VASCULAR ACCESS DEVICE INSERTION Right 11/24/2020   Procedure: INSERTION OF HERO VASCULAR ACCESS DEVICE (GRAFT);  Surgeon: Katha Cabal, MD;  Location: [facility];  Service:  Vascular;  Laterality: Right;    Social History Social History   Tobacco Use   Smoking status: Never   Smokeless tobacco: Never  Vaping Use   Vaping Use: Never used  Substance Use Topics   Alcohol use: No   Drug use: No    Family History Family History  Problem Relation Age of Onset   Hypertension Sister     Allergies  Allergen Reactions   Gadolinium Derivatives Hives    Other reaction(s): Unknown Other reaction(s): Other (See Comments) Other reaction(s): Unknown   Penicillins Hives, Itching and Swelling    Has patient had a PCN reaction causing immediate rash, facial/tongue/throat swelling, SOB or lightheadedness with hypotension: No Has patient had a PCN reaction causing severe rash involving mucus membranes or skin necrosis: No Has patient had a PCN reaction that required hospitalization: No Has patient had a PCN reaction occurring within the last 10 years: No If all of the above answers are "NO", then may proceed with Cephalosporin use.  Penicillin allergy assessment completed on 03/22/20. Patient has received and tolerated cepholosporins incuding cefepime, ceftriaxone.    Codeine Hives   Codeine Sulfate Nausea Only   Contrast Media [Iodinated Contrast Media] Hives   Gadobutrol Hives   Metrizamide Hives   Statins Other (See Comments)    muscle pain     REVIEW OF SYSTEMS (Negative unless checked)  Constitutional: '[]'$ Weight loss  '[]'$ Fever  '[]'$ Chills Cardiac: '[]'$ Chest pain   '[]'$ Chest pressure   '[]'$ Palpitations   '[]'$ Shortness of breath when laying flat   '[]'$ Shortness of breath with exertion. Vascular:  '[]'$ Pain in legs with walking   '[]'$ Pain in legs at rest  '[]'$ History of DVT   '[]'$ Phlebitis   '[]'$ Swelling in legs   '[]'$ Varicose veins   '[]'$ Non-healing ulcers Pulmonary:   '[]'$ Uses home oxygen   '[]'$ Productive cough   '[]'$ Hemoptysis   '[]'$ Wheeze  '[]'$ COPD   '[]'$ Asthma Neurologic:  '[]'$ Dizziness   '[]'$ Seizures   '[]'$ History of stroke   '[]'$ History of TIA  '[]'$ Aphasia   '[]'$ Vissual changes   '[]'$ Weakness or  numbness in arm   '[]'$ Weakness or numbness in leg Musculoskeletal:   '[]'$ Joint swelling   '[]'$ Joint pain   '[]'$ Low back pain Hematologic:  '[]'$ Easy bruising  '[]'$ Easy bleeding   '[]'$ Hypercoagulable state   '[]'$ Anemic Gastrointestinal:  '[]'$ Diarrhea   '[]'$ Vomiting  '[]'$ Gastroesophageal reflux/heartburn   '[]'$ Difficulty swallowing. Genitourinary:  '[x]'$ Chronic kidney disease   '[]'$ Difficult urination  '[]'$ Frequent urination   '[]'$ Blood in urine Skin:  '[]'$ Rashes   '[]'$ Ulcers  Psychological:  '[]'$ History of anxiety   '[]'$  History of major depression.  Physical Examination  Vitals:   07/09/21 1033  BP: (!) 154/74  Pulse: 76  Resp: 16  Weight: 153 lb 12.8 oz (69.8 kg)   Body mass index is 31.06 kg/m. Gen: WD/WN, NAD Head: [place]/AT, No temporalis wasting.  Ear/Nose/Throat: Hearing grossly intact, nares w/o erythema or drainage Eyes: PER, EOMI, sclera nonicteric.  Neck: Supple, no  gross masses or lesions.  No JVD.  Pulmonary:  Good air movement, no audible wheezing, no use of accessory muscles.  Cardiac: RRR, precordium non-hyperdynamic. Vascular:   Right arm hero good thrill good bruit Vessel Right Left  Radial Weekly palpable Palpable  Brachial 1+ palpable Palpable  Gastrointestinal: soft, non-distended. No guarding/no peritoneal signs.  Musculoskeletal: M/S 5/5 throughout.  No deformity.  Neurologic: CN 2-12 intact. Pain and light touch intact in extremities.  Symmetrical.  Speech is fluent. Motor exam as listed above. Psychiatric: Judgment intact, Mood & affect appropriate for pt's clinical situation. Dermatologic: No rashes or ulcers noted.  No changes consistent with cellulitis.   CBC Lab Results  Component Value Date   WBC 9.6 12/02/2020   HGB 8.7 (L) 12/02/2020   HCT 27.0 (L) 12/02/2020   MCV 86.0 12/02/2020   PLT 203 12/02/2020    BMET    Component Value Date/Time   NA 136 12/02/2020 0820   NA 135 (L) 03/29/2013 1409   K 4.0 12/02/2020 0820   K 4.5 03/29/2013 1409   CL 96 (L) 12/02/2020 0820   CL 97  (L) 03/29/2013 1409   CO2 25 12/02/2020 0820   CO2 27 03/29/2013 1409   GLUCOSE 108 (H) 12/02/2020 0820   GLUCOSE 97 03/29/2013 1409   BUN 31 (H) 12/02/2020 0820   BUN 48 (H) 03/29/2013 1409   CREATININE 6.81 (H) 12/02/2020 0820   CREATININE 12.06 (H) 03/29/2013 1409   CALCIUM 8.3 (L) 12/02/2020 0820   CALCIUM 8.8 03/29/2013 1409   GFRNONAA 6 (L) 12/02/2020 0820   GFRNONAA 3 (L) 03/29/2013 1409   GFRAA 3 (L) 01/21/2019 0504   GFRAA 3 (L) 03/29/2013 1409   CrCl cannot be calculated (Patient's most recent lab result is older than the maximum 21 days allowed.).  COAG Lab Results  Component Value Date   INR 1.1 11/30/2020   INR 1.1 11/25/2020   INR 1.1 11/17/2020    Radiology PERIPHERAL VASCULAR CATHETERIZATION  Result Date: 06/22/2021 See surgical note for result.    Assessment/Plan 1. End stage renal disease ([place]) Recommend:  At this time the patient does not have appropriate extremity access for dialysis  Patient should have her right arm hero graft banded to eliminate the steal symptoms.  The risks, benefits and alternative therapies were reviewed in detail with the patient.  All questions were answered.  The patient agrees to proceed with surgery.    2. Complication of vascular access for dialysis, sequela See 1  3. Type 2 diabetes mellitus with vascular disease ([place]) Continue hypoglycemic medications as already ordered, these medications have been reviewed and there are no changes at this time.  Hgb A1C to be monitored as already arranged by primary service   4. Coronary artery disease of native artery of native heart with stable angina pectoris (HCC) Continue cardiac and antihypertensive medications as already ordered and reviewed, no changes at this time.  Continue statin as ordered and reviewed, no changes at this time  Nitrates PRN for chest pain   5. Primary hypertension Continue antihypertensive medications as already ordered, these medications have  been reviewed and there are no changes at this time.     Hortencia Pilar, MD  07/09/2021 10:35 AM

## 2021-07-09 NOTE — H&P (View-Only) (Signed)
? ? ?MRN : [id number] ? ?Kelli Nguyen is a 76 y.o. (06-03-1945) female who presents with chief complaint of check av access. ? ?History of Present Illness:  ? ?The patient returns to the office for followup status post intervention of the dialysis access right arm hero graft.   ? ?Procedure 06/22/2021: ?Right upper extremity angiography third order catheter placement ?Percutaneous transluminal angioplasty to 2 mm right ulnar artery ? ?Following the intervention the excess function was unchanged per the patient.  The patient continues to be experiencing increased bleeding times following decannulation and increased recirculation with diminished efficiency of their dialysis. The patient denies an increase in arm swelling. At the present time the patient denies hand pain. ? ?The patient denies amaurosis fugax or recent TIA symptoms. There are no recent neurological changes noted. ?The patient denies claudication symptoms or rest pain symptoms. ?The patient denies history of DVT, PE or superficial thrombophlebitis. ?The patient denies recent episodes of angina or shortness of breath.   ? ?Current Meds  ?Medication Sig  ? acetaminophen (TYLENOL) 500 MG tablet Take 500 mg by mouth every 6 (six) hours as needed for moderate pain or headache.  ? aspirin 81 MG tablet Take 81 mg by mouth daily.  ? cinacalcet (SENSIPAR) 30 MG tablet Take 30 mg by mouth Every Tuesday,Thursday,and Saturday with dialysis.  ? clopidogrel (PLAVIX) 75 MG tablet TAKE 1 TABLET BY MOUTH EVERY DAY  ? diclofenac Sodium (VOLTAREN) 1 % GEL Apply topically.  ? docusate sodium (COLACE) 100 MG capsule Take 100 mg by mouth daily.  ? latanoprost (XALATAN) 0.005 % ophthalmic solution Place 1 drop into both eyes at bedtime.  ? lidocaine (LIDODERM) 5 % SMARTSIG:Topical  ? midodrine (PROAMATINE) 5 MG tablet Take 1 tablet (5 mg total) by mouth Every Tuesday,Thursday,and Saturday with dialysis. Please give 30 minutes prior to dialysis. Do NOT hold for dialysis. GIVE  DOSE DURING DAYLIGHT HOURS ONLY  ? pantoprazole (PROTONIX) 40 MG tablet Take 40 mg by mouth daily.  ? pravastatin (PRAVACHOL) 20 MG tablet Take 20 mg by mouth daily.  ? sevelamer carbonate (RENVELA) 800 MG tablet Take 1,600 mg by mouth 3 (three) times daily with meals.  ? Timolol Maleate 0.5 % (DAILY) SOLN Place 1 drop into both eyes every morning.  ? ? ?Past Medical History:  ?Diagnosis Date  ? Anemia   ? Carpal tunnel syndrome of left wrist   ? Cataract, bilateral   ? Chronic cough   ? Closed displaced fracture of left femoral neck ([place]) 03/09/2017  ? Coronary artery disease   ? s/p 3v CABG (LIMA-LAD, SVG-OM1, SVG-PDA) in 2008  ? Diabetes mellitus without complication ([place])   ? ESRD (end stage renal disease) on dialysis [facility])   ? T-TH-SAT  ? GERD (gastroesophageal reflux disease)   ? Grade II diastolic dysfunction   ? HLD (hyperlipidemia)   ? Hypertension   ? Murmur   ? OA (osteoarthritis)   ? PAD (peripheral artery disease) ([place])   ? Pneumonia   ? Polyneuropathy   ? feet and hands  ? Pulmonary hypertension ([place])   ? RBBB   ? Secondary hyperparathyroidism of renal origin [facility])   ? Sepsis ([place]) 06/28/2017  ? Wears dentures   ? partial upper and lower  ? ? ?Past Surgical History:  ?Procedure Laterality Date  ? A/V SHUNT INTERVENTION N/A 10/16/2016  ? Procedure: A/V Shunt Intervention;  Surgeon: Katha Cabal, MD;  Location: [place] CV LAB;  Service: Cardiovascular;  Laterality: N/A;  ?  A/V SHUNTOGRAM Left 10/16/2016  ? Procedure: A/V Shuntogram;  Surgeon: Katha Cabal, MD;  Location: [place] CV LAB;  Service: Cardiovascular;  Laterality: Left;  ? ABDOMINAL HYSTERECTOMY    ? CARDIAC CATHETERIZATION N/A 02/26/2014  ? CARPAL TUNNEL RELEASE Left 09/27/2015  ? Procedure: CARPAL TUNNEL RELEASE;  Surgeon: Leanor Kail, MD;  Location: [facility] ORS;  Service: Orthopedics;  Laterality: Left;  ? CATARACT EXTRACTION W/PHACO Right 09/23/2016  ? Procedure: CATARACT EXTRACTION PHACO AND INTRAOCULAR LENS  PLACEMENT (IOC) complicated diabetic right;  Surgeon: Ronnell Freshwater, MD;  Location: [place];  Service: Ophthalmology;  Laterality: Right;  Diabetic - diet controlled ?Potassium draw before surgery  ? CATARACT EXTRACTION W/PHACO Left 01/20/2017  ? Procedure: CATARACT EXTRACTION PHACO AND INTRAOCULAR LENS PLACEMENT ([place]) LEFT DIABETIC;  Surgeon: Eulogio Bear, MD;  Location: [place];  Service: Ophthalmology;  Laterality: Left;  Diabetic - diet controlled ?Potassium draw prior to procedure  8:00 ARRIVAL  ? COLONOSCOPY    ? COLONOSCOPY WITH PROPOFOL N/A 11/28/2014  ? Procedure: COLONOSCOPY WITH PROPOFOL;  Surgeon: Manya Silvas, MD;  Location: [facility] ENDOSCOPY;  Service: Endoscopy;  Laterality: N/A;  ? CORONARY ARTERY BYPASS GRAFT N/A 2008  ? 3v; LIMA-LAD, SVG-OM1, SVG-PDA; Location: [facility]; Surgeon: Jannett Celestine, MD  ? DIALYSIS/PERMA CATHETER INSERTION Left 01/21/2019  ? Procedure: DIALYSIS/PERMA CATHETER EXCHANGE;  Surgeon: Algernon Huxley, MD;  Location: [place] CV LAB;  Service: Cardiovascular;  Laterality: Left;  ? DIALYSIS/PERMA CATHETER REMOVAL N/A 03/12/2021  ? Procedure: DIALYSIS/PERMA CATHETER REMOVAL;  Surgeon: Algernon Huxley, MD;  Location: [place] CV LAB;  Service: Cardiovascular;  Laterality: N/A;  ? HEMIARTHROPLASTY HIP Left 2018  ? INSERTION OF DIALYSIS CATHETER Left 01/19/2019  ? Procedure: INSERTION OF DIALYSIS CATHETER;  Surgeon: Katha Cabal, MD;  Location: [facility] ORS;  Service: Vascular;  Laterality: Left;  ? LIGATIONS OF HERO GRAFT Left 01/19/2019  ? Procedure: EXCISION LEFT HERO GRAFT;  Surgeon: Katha Cabal, MD;  Location: [facility] ORS;  Service: Vascular;  Laterality: Left;  ? NEPHRECTOMY Left   ? PERIPHERAL VASCULAR CATHETERIZATION Left 01/18/2015  ? Procedure: A/V Shuntogram/Fistulagram;  Surgeon: Katha Cabal, MD;  Location: [place] CV LAB;  Service: Cardiovascular;  Laterality: Left;  ? PERIPHERAL VASCULAR CATHETERIZATION Left  01/18/2015  ? Procedure: A/V Shunt Intervention;  Surgeon: Katha Cabal, MD;  Location: [place] CV LAB;  Service: Cardiovascular;  Laterality: Left;  ? PERIPHERAL VASCULAR CATHETERIZATION N/A 08/10/2015  ? Procedure: Thrombectomy;  Surgeon: Algernon Huxley, MD;  Location: [place] CV LAB;  Service: Cardiovascular;  Laterality: N/A;  ? PERIPHERAL VASCULAR CATHETERIZATION N/A 08/10/2015  ? Procedure: A/V Shuntogram/Fistulagram;  Surgeon: Algernon Huxley, MD;  Location: [place] CV LAB;  Service: Cardiovascular;  Laterality: N/A;  ? PERIPHERAL VASCULAR CATHETERIZATION N/A 08/10/2015  ? Procedure: A/V Shunt Intervention;  Surgeon: Algernon Huxley, MD;  Location: [place] CV LAB;  Service: Cardiovascular;  Laterality: N/A;  ? PERIPHERAL VASCULAR THROMBECTOMY Left 01/12/2019  ? Procedure: PERIPHERAL VASCULAR THROMBECTOMY;  Surgeon: Katha Cabal, MD;  Location: [place] CV LAB;  Service: Cardiovascular;  Laterality: Left;  ? REMOVAL OF A DIALYSIS CATHETER Right 01/19/2019  ? Procedure: REMOVAL OF A DIALYSIS CATHETER;  Surgeon: Katha Cabal, MD;  Location: [facility];  Service: Vascular;  Laterality: Right;  ? RENAL SHUNTS    ? UPPER EXTREMITY ANGIOGRAPHY Right 12/20/2020  ? Procedure: UPPER EXTREMITY ANGIOGRAPHY;  Surgeon: Katha Cabal, MD;  Location: [facility] INVASIVE CV  LAB;  Service: Cardiovascular;  Laterality: Right;  ? UPPER EXTREMITY ANGIOGRAPHY Right 06/22/2021  ? Procedure: Upper Extremity Angiography;  Surgeon: Katha Cabal, MD;  Location: [place] CV LAB;  Service: Cardiovascular;  Laterality: Right;  ? UPPER EXTREMITY VENOGRAPHY Right 03/20/2020  ? Procedure: UPPER EXTREMITY VENOGRAPHY;  Surgeon: Algernon Huxley, MD;  Location: [place] CV LAB;  Service: Cardiovascular;  Laterality: Right;  ? VASCULAR ACCESS DEVICE INSERTION Right 11/24/2020  ? Procedure: INSERTION OF HERO VASCULAR ACCESS DEVICE (GRAFT);  Surgeon: Katha Cabal, MD;  Location: [facility] ORS;  Service:  Vascular;  Laterality: Right;  ? ? ?Social History ?Social History  ? ?Tobacco Use  ? Smoking status: Never  ? Smokeless tobacco: Never  ?Vaping Use  ? Vaping Use: Never used  ?Substance Use Topics  ? Alcohol

## 2021-07-10 ENCOUNTER — Telehealth (INDEPENDENT_AMBULATORY_CARE_PROVIDER_SITE_OTHER): Payer: Self-pay

## 2021-07-10 ENCOUNTER — Encounter (INDEPENDENT_AMBULATORY_CARE_PROVIDER_SITE_OTHER): Payer: Self-pay | Admitting: Vascular Surgery

## 2021-07-10 NOTE — Telephone Encounter (Signed)
I attempted to contact the patient to schedule a right arm HERO graft banding with Dr. Delana Meyer. A message was left for a return call. ?

## 2021-07-10 NOTE — Telephone Encounter (Signed)
Patient called back and is scheduled with Dr. Delana Meyer for a right arm HERO graft banding on 08/03/21 with a pre-op phone call on 07/24/21  between 1-5. Pre-surgical instructions were discussed and will  be mailed. ?

## 2021-07-24 ENCOUNTER — Encounter
Admission: RE | Admit: 2021-07-24 | Discharge: 2021-07-24 | Disposition: A | Discharge status: Home / Self Care | Payer: Medicare Other | Source: Ambulatory Visit | Attending: Vascular Surgery | Admitting: Vascular Surgery

## 2021-07-24 ENCOUNTER — Other Ambulatory Visit: Payer: Self-pay

## 2021-07-24 ENCOUNTER — Other Ambulatory Visit (INDEPENDENT_AMBULATORY_CARE_PROVIDER_SITE_OTHER): Payer: Self-pay | Admitting: Nurse Practitioner

## 2021-07-24 ENCOUNTER — Telehealth: Payer: Self-pay

## 2021-07-24 DIAGNOSIS — N186 End stage renal disease: Secondary | ICD-10-CM

## 2021-07-24 HISTORY — DX: Unspecified glaucoma: H40.9

## 2021-07-24 NOTE — Telephone Encounter (Signed)
Will send message to [facility] office to see where they can get pt scheduled for pre op appt. Pt's procedure is set for 08/03/21.  ?

## 2021-07-24 NOTE — Patient Instructions (Addendum)
Your procedure is scheduled on: Friday, March 31 ?Report to the Registration Desk on the 1st floor of the [facility]. ?To find out your arrival time, please call [phone] between 1PM - 3PM on: Thursday, March 30 ? ?REMEMBER: ?Instructions that are not followed completely may result in serious medical risk, up to and including death; or upon the discretion of your surgeon and anesthesiologist your surgery may need to be rescheduled. ? ?Do not eat or drink after midnight the night before surgery.  ?No gum chewing, lozengers or hard candies. ? ?TAKE THESE MEDICATIONS THE MORNING OF SURGERY WITH A SIP OF WATER: ? ?Pantoprazole (Protonix) - (take one the night before and one on the morning of surgery - helps to prevent nausea after surgery.) ?Pravastatin ?3.  Timolol eye drop ? ?Stop clopidogrel (Plavix) 3 days prior to surgery. Last day to take is Monday, March 27. Resume AFTER surgery per surgeon instruction. ?Continue taking your daily aspirin up until the day of surgery. ? ?One week prior to surgery: ?Stop Anti-inflammatories (NSAIDS) such as Advil, Aleve, Ibuprofen, Motrin, Naproxen, Naprosyn and Excedrin, Goodys Powder, BC Powder. ?Stop ANY OVER THE COUNTER supplements until after surgery. ?You may however, continue to take Tylenol if needed for pain up until the day of surgery. ? ?No Alcohol for 24 hours before or after surgery. ? ?No Smoking including e-cigarettes for 24 hours prior to surgery.  ?No chewable tobacco products for at least 6 hours prior to surgery.  ?No nicotine patches on the day of surgery. ? ?Do not use any "recreational" drugs for at least a week prior to your surgery.  ?Please be advised that the combination of cocaine and anesthesia may have negative outcomes, up to and including death. ?If you test positive for cocaine, your surgery will be cancelled. ? ?On the morning of surgery brush your teeth with toothpaste and water, you may rinse your mouth with mouthwash if you wish. ?Do not  swallow any toothpaste or mouthwash. ? ?Use CHG Soap as directed on instruction sheet. ? ?Do not wear jewelry, make-up, hairpins, clips or nail polish. ? ?Do not wear lotions, powders, or perfumes.  ? ?Do not shave body from the neck down 48 hours prior to surgery just in case you cut yourself which could leave a site for infection.  ?Also, freshly shaved skin may become irritated if using the CHG soap. ? ?Contact lenses, hearing aids and dentures may not be worn into surgery. ? ?Do not bring valuables to the hospital. [facility] is not responsible for any missing/lost belongings or valuables.  ? ?Notify your doctor if there is any change in your medical condition (cold, fever, infection). ? ?Wear comfortable clothing (specific to your surgery type) to the hospital. ? ?After surgery, you can help prevent lung complications by doing breathing exercises.  ?Take deep breaths and cough every 1-2 hours. Your doctor may order a device called an Incentive Spirometer to help you take deep breaths. ? ?If you are being discharged the day of surgery, you will not be allowed to drive home. ?You will need a responsible adult (18 years or older) to drive you home and stay with you that night.  ? ?If you are taking public transportation, you will need to have a responsible adult (18 years or older) with you. ?Please confirm with your physician that it is acceptable to use public transportation.  ? ?Please call the [place] Dept. at [phone] if you have any questions about these instructions. ? ?  Surgery Visitation Policy: ? ?Patients undergoing a surgery or procedure may have two family members or support persons with them as long as the person is not COVID-19 positive or experiencing its symptoms.  ?

## 2021-07-24 NOTE — Telephone Encounter (Signed)
? ?  Pre-operative Risk Assessment  ?  ?Patient Name: Kelli Nguyen  ?DOB: 1946/03/22 ?MRN: [id number]  ? ?  ? ?Request for Surgical Clearance   ? ?Procedure:   Banding Hero Graft ? ?Date of Surgery:  Clearance 08/03/21                              ?   ?Surgeon:  Hortencia Pilar, MD ?Surgeon's Group or Practice Name:  [facility] ?Phone number:  [phone] ?Fax number:  [phone] ?  ?Type of Clearance Requested:   ?- Medical  ?- Pharmacy:  Hold Aspirin and Clopidogrel (Plavix)   ?  ?Type of Anesthesia:  General  ?  ?Additional requests/questions:     ? ?Signed, ?Ranisha Allaire   ?07/24/2021, 5:27 PM  ? ?

## 2021-07-24 NOTE — Telephone Encounter (Signed)
? ?  Name: Kelli Nguyen  ?DOB: 1945/08/28  ?MRN: [id number] ? ?Primary Cardiologist: Nelva Bush, MD ? ?Chart reviewed as part of pre-operative protocol coverage. Because of Kelli Nguyen's past medical history and time since last visit, she will require a follow-up visit in order to better assess preoperative cardiovascular risk. ? ?Pre-op covering staff: ?- Please schedule appointment and call patient to inform them. If patient already had an upcoming appointment within acceptable timeframe, please add "pre-op clearance" to the appointment notes so provider is aware. ?- Please contact requesting surgeon's office via preferred method (i.e, phone, fax) to inform them of need for appointment prior to surgery. ? ?If applicable, this message will also be routed to pharmacy pool and/or primary cardiologist for input on holding anticoagulant/antiplatelet agent as requested below so that this information is available to the clearing provider at time of patient's appointment.  ? ?Ledora Bottcher, PA  ?07/24/2021, 6:10 PM  ? ?

## 2021-07-25 ENCOUNTER — Ambulatory Visit (INDEPENDENT_AMBULATORY_CARE_PROVIDER_SITE_OTHER): Payer: Medicare Other | Admitting: Physician Assistant

## 2021-07-25 ENCOUNTER — Encounter: Payer: Self-pay | Admitting: Physician Assistant

## 2021-07-25 VITALS — BP 168/81 | HR 75 | Ht 59.0 in | Wt 152.1 lb

## 2021-07-25 DIAGNOSIS — N186 End stage renal disease: Secondary | ICD-10-CM

## 2021-07-25 DIAGNOSIS — E785 Hyperlipidemia, unspecified: Secondary | ICD-10-CM

## 2021-07-25 DIAGNOSIS — I272 Pulmonary hypertension, unspecified: Secondary | ICD-10-CM | POA: Diagnosis not present

## 2021-07-25 DIAGNOSIS — Z01810 Encounter for preprocedural cardiovascular examination: Secondary | ICD-10-CM | POA: Diagnosis not present

## 2021-07-25 DIAGNOSIS — I2510 Atherosclerotic heart disease of native coronary artery without angina pectoris: Secondary | ICD-10-CM | POA: Diagnosis not present

## 2021-07-25 DIAGNOSIS — Z951 Presence of aortocoronary bypass graft: Secondary | ICD-10-CM

## 2021-07-25 DIAGNOSIS — E1151 Type 2 diabetes mellitus with diabetic peripheral angiopathy without gangrene: Secondary | ICD-10-CM

## 2021-07-25 NOTE — Telephone Encounter (Signed)
Pt has appt today with Christell Faith, PAC for pre op clearance.I will forward clearance notes to [facility] for appt today.  ?

## 2021-07-25 NOTE — Progress Notes (Signed)
? ?Cardiology Office Note   ? ?Date:  07/25/2021  ? ?ID:  KIOSHA BUCHAN, DOB June 08, 1945, MRN [id number] ? ?PCP:  Crissie Figures, PA-C  ?Cardiologist:  Nelva Bush, MD  ?Electrophysiologist:  None  ? ?Chief Complaint: Preoperative cardiac risk stratification ? ?History of Present Illness:  ? ?Kelli Nguyen is a 76 y.o. female with history of CAD status post three-vessel CABG in 2008 with LIMA to LAD, SVG to OM, and SVG to PDA, pulmonary hypertension, ESRD on HD (TTS), DM2, HTN, HLD, neuropathy, anemia of chronic disease, and chronic cough who presents for preoperative cardiac risk stratification. ? ?LHC in 2015 was suggestive of severe disease involving the LIMA to LAD, though the graft was apparently not well visualized.  No intervention was undertaken at that time. ? ?She was last seen in our office in 08/2020, at the request of vascular surgery for preoperative cardiac risk stratification for HERO graft placement.  She has previously had issues with dialysis access and required excision of infected left arm HERO graft in 01/2019.  She was without symptoms of angina or decompensation at that time.  Given planned vascular surgery and limited functional capacity, she underwent Lexiscan MPI on 09/20/2020 which showed no evidence of ischemia and was overall low risk.  LV systolic function was hyperdynamic estimated at greater than 65%.  Echo at that time demonstrated an EF of 55 to 60%, no regional wall motion abnormalities, grade 2 diastolic dysfunction, low normal RV systolic function with normal ventricular cavity size, moderately elevated PASP estimated at 40.7 mmHg, and no significant valvular abnormalities.  Given the above findings, she was felt to be acceptable risk for noncardiac procedure with HERO graft placement. ? ?We received preoperative risk stratification request for banding of HERO graft with recommendation to hold aspirin and clopidogrel.  Procedure is scheduled for 08/03/2021.  The patient's  clopidogrel is prescribed by vascular surgery.  Preprocedure patient instructions indicate the patient may continue aspirin up to day of surgery. ? ?She comes in doing well from a cardiac perspective and is without symptoms of angina or decompensation.  No dizziness, presyncope, or syncope.  Her chronic lower extremity swelling is stable.  No falls, hematochezia, or melena.  She does watch her salt and p.o. fluid intake.  No orthopnea. ? ?Revised Cardiac Risk Index: Moderate risk for non cardiac procedure with an estimated rate of 6.6% for adverse cardiac event in the peri-procedure time frame ? ?[facility] Activity Status Index: > 4 METs ? ? ?Labs independently reviewed: ?06/2021 - potassium 4.1 ?11/2020 - Hgb 8.7, PLT 203, BUN 31, serum creatinine 6.81, albumin 3.8, AST/ALT normal, A1c 5.2 ?07/2017 - magnesium 2.3 ? ?Past Medical History:  ?Diagnosis Date  ? Anemia   ? Carpal tunnel syndrome of left wrist   ? Cataract, bilateral   ? Chronic cough   ? Closed displaced fracture of left femoral neck ([place]) 03/09/2017  ? Coronary artery disease   ? s/p 3v CABG (LIMA-LAD, SVG-OM1, SVG-PDA) in 2008  ? Diabetes mellitus without complication ([place])   ? ESRD (end stage renal disease) on dialysis [facility])   ? T-TH-SAT  ? GERD (gastroesophageal reflux disease)   ? Glaucoma   ? Grade II diastolic dysfunction   ? HLD (hyperlipidemia)   ? Hypertension   ? Murmur   ? OA (osteoarthritis)   ? PAD (peripheral artery disease) ([place])   ? Pneumonia   ? Polyneuropathy   ? feet and hands  ? Pulmonary hypertension ([place])   ?  RBBB   ? Secondary hyperparathyroidism of renal origin [facility])   ? Sepsis ([place]) 06/28/2017  ? Wears dentures   ? partial upper and lower  ? ? ?Past Surgical History:  ?Procedure Laterality Date  ? A/V SHUNT INTERVENTION N/A 10/16/2016  ? Procedure: A/V Shunt Intervention;  Surgeon: Katha Cabal, MD;  Location: [place] CV LAB;  Service: Cardiovascular;  Laterality: N/A;  ? A/V SHUNTOGRAM Left 10/16/2016  ? Procedure: A/V  Shuntogram;  Surgeon: Katha Cabal, MD;  Location: [place] CV LAB;  Service: Cardiovascular;  Laterality: Left;  ? ABDOMINAL HYSTERECTOMY    ? CARDIAC CATHETERIZATION N/A 02/26/2014  ? CARPAL TUNNEL RELEASE Left 09/27/2015  ? Procedure: CARPAL TUNNEL RELEASE;  Surgeon: Leanor Kail, MD;  Location: [facility] ORS;  Service: Orthopedics;  Laterality: Left;  ? CATARACT EXTRACTION W/PHACO Right 09/23/2016  ? Procedure: CATARACT EXTRACTION PHACO AND INTRAOCULAR LENS PLACEMENT (I[facility]) complicated diabetic right;  Surgeon: Ronnell Freshwater, MD;  Location: [place];  Service: Ophthalmology;  Laterality: Right;  Diabetic - diet controlled ?Potassium draw before surgery  ? CATARACT EXTRACTION W/PHACO Left 01/20/2017  ? Procedure: CATARACT EXTRACTION PHACO AND INTRAOCULAR LENS PLACEMENT ([place]) LEFT DIABETIC;  Surgeon: Eulogio Bear, MD;  Location: [place];  Service: Ophthalmology;  Laterality: Left;  Diabetic - diet controlled ?Potassium draw prior to procedure  8:00 ARRIVAL  ? COLONOSCOPY    ? COLONOSCOPY WITH PROPOFOL N/A 11/28/2014  ? Procedure: COLONOSCOPY WITH PROPOFOL;  Surgeon: Manya Silvas, MD;  Location: [facility] ENDOSCOPY;  Service: Endoscopy;  Laterality: N/A;  ? CORONARY ARTERY BYPASS GRAFT N/A 2008  ? 3v; LIMA-LAD, SVG-OM1, SVG-PDA; Location: [facility]; Surgeon: Jannett Celestine, MD  ? DIALYSIS/PERMA CATHETER INSERTION Left 01/21/2019  ? Procedure: DIALYSIS/PERMA CATHETER EXCHANGE;  Surgeon: Algernon Huxley, MD;  Location: [place] CV LAB;  Service: Cardiovascular;  Laterality: Left;  ? DIALYSIS/PERMA CATHETER REMOVAL N/A 03/12/2021  ? Procedure: DIALYSIS/PERMA CATHETER REMOVAL;  Surgeon: Algernon Huxley, MD;  Location: [place] CV LAB;  Service: Cardiovascular;  Laterality: N/A;  ? HEMIARTHROPLASTY HIP Left 2018  ? INSERTION OF DIALYSIS CATHETER Left 01/19/2019  ? Procedure: INSERTION OF DIALYSIS CATHETER;  Surgeon: Katha Cabal, MD;  Location: [facility] OR[facility];  Service:  Vascular;  Laterality: Left;  ? LIGATIONS OF HERO GRAFT Left 01/19/2019  ? Procedure: EXCISION LEFT HERO GRAFT;  Surgeon: Katha Cabal, MD;  Location: [facility] ORS;  Service: Vascular;  Laterality: Left;  ? NEPHRECTOMY Left   ? PERIPHERAL VASCULAR CATHETERIZATION Left 01/18/2015  ? Procedure: A/V Shuntogram/Fistulagram;  Surgeon: Katha Cabal, MD;  Location: [place] CV LAB;  Service: Cardiovascular;  Laterality: Left;  ? PERIPHERAL VASCULAR CATHETERIZATION Left 01/18/2015  ? Procedure: A/V Shunt Intervention;  Surgeon: Katha Cabal, MD;  Location: [place] CV LAB;  Service: Cardiovascular;  Laterality: Left;  ? PERIPHERAL VASCULAR CATHETERIZATION N/A 08/10/2015  ? Procedure: Thrombectomy;  Surgeon: Algernon Huxley, MD;  Location: [place] CV LAB;  Service: Cardiovascular;  Laterality: N/A;  ? PERIPHERAL VASCULAR CATHETERIZATION N/A 08/10/2015  ? Procedure: A/V Shuntogram/Fistulagram;  Surgeon: Algernon Huxley, MD;  Location: [place] CV LAB;  Service: Cardiovascular;  Laterality: N/A;  ? PERIPHERAL VASCULAR CATHETERIZATION N/A 08/10/2015  ? Procedure: A/V Shunt Intervention;  Surgeon: Algernon Huxley, MD;  Location: [place] CV LAB;  Service: Cardiovascular;  Laterality: N/A;  ? PERIPHERAL VASCULAR THROMBECTOMY Left 01/12/2019  ? Procedure: PERIPHERAL VASCULAR THROMBECTOMY;  Surgeon: Katha Cabal, MD;  Location: [facility] INVASIVE CV  LAB;  Service: Cardiovascular;  Laterality: Left;  ? REMOVAL OF A DIALYSIS CATHETER Right 01/19/2019  ? Procedure: REMOVAL OF A DIALYSIS CATHETER;  Surgeon: Katha Cabal, MD;  Location: [facility] ORS;  Service: Vascular;  Laterality: Right;  ? RENAL SHUNTS    ? UPPER EXTREMITY ANGIOGRAPHY Right 12/20/2020  ? Procedure: UPPER EXTREMITY ANGIOGRAPHY;  Surgeon: Katha Cabal, MD;  Location: [place] CV LAB;  Service: Cardiovascular;  Laterality: Right;  ? UPPER EXTREMITY ANGIOGRAPHY Right 06/22/2021  ? Procedure: Upper Extremity Angiography;  Surgeon:  Katha Cabal, MD;  Location: [place] CV LAB;  Service: Cardiovascular;  Laterality: Right;  ? UPPER EXTREMITY VENOGRAPHY Right 03/20/2020  ? Procedure: UPPER EXTREMITY VENOGRAPHY;  Surgeon: Algernon Huxley,

## 2021-07-25 NOTE — Telephone Encounter (Signed)
Not on anticoagulation 

## 2021-07-25 NOTE — Patient Instructions (Addendum)
Medication Instructions:  ?No changes at this time.  ? ?*If you need a refill on your cardiac medications before your next appointment, please call your pharmacy* ? ? ?Lab Work: ?None ? ?If you have labs (blood work) drawn today and your tests are completely normal, you will receive your results only by: ?MyChart Message (if you have MyChart) OR ?A paper copy in the mail ?If you have any lab test that is abnormal or we need to change your treatment, we will call you to review the results. ? ? ?Testing/Procedures: ?None ? ? ?Follow-Up: ?At [facility], you and your health needs are our priority.  As part of our continuing mission to provide you with exceptional heart care, we have created designated Provider Care Teams.  These Care Teams include your primary Cardiologist (physician) and Advanced Practice Providers (APPs -  Physician Assistants and Nurse Practitioners) who all work together to provide you with the care you need, when you need it. ? ?We recommend signing up for the patient portal called "MyChart".  Sign up information is provided on this After Visit Summary.  MyChart is used to connect with patients for Virtual Visits (Telemedicine).  Patients are able to view lab/test results, encounter notes, upcoming appointments, etc.  Non-urgent messages can be sent to your provider as well.   ?To learn more about what you can do with MyChart, go to [email or web address].   ? ?Your next appointment:   ?2 month(s) ? ?The format for your next appointment:   ?In Person ? ?Provider:   ?Nelva Bush, MD or Christell Faith, PA-C  ?

## 2021-07-26 ENCOUNTER — Encounter: Payer: Self-pay | Admitting: Urgent Care

## 2021-07-26 ENCOUNTER — Encounter
Admission: RE | Admit: 2021-07-26 | Discharge: 2021-07-26 | Disposition: A | Discharge status: Home / Self Care | Payer: Medicare Other | Source: Ambulatory Visit | Attending: Vascular Surgery | Admitting: Vascular Surgery

## 2021-07-26 ENCOUNTER — Other Ambulatory Visit: Payer: Self-pay

## 2021-07-26 DIAGNOSIS — I2510 Atherosclerotic heart disease of native coronary artery without angina pectoris: Secondary | ICD-10-CM | POA: Insufficient documentation

## 2021-07-26 DIAGNOSIS — Z01818 Encounter for other preprocedural examination: Secondary | ICD-10-CM | POA: Insufficient documentation

## 2021-07-26 DIAGNOSIS — N186 End stage renal disease: Secondary | ICD-10-CM | POA: Diagnosis not present

## 2021-07-26 LAB — BASIC METABOLIC PANEL
Anion gap: 15 (ref 5–15)
BUN: 12 mg/dL (ref 8–23)
CO2: 31 mmol/L (ref 22–32)
Calcium: 8.4 mg/dL — ABNORMAL LOW (ref 8.9–10.3)
Chloride: 93 mmol/L — ABNORMAL LOW (ref 98–111)
Creatinine, Ser: 3.48 mg/dL — ABNORMAL HIGH (ref 0.44–1.00)
GFR, Estimated: 13 mL/min — ABNORMAL LOW (ref >60)
Glucose, Bld: 113 mg/dL — ABNORMAL HIGH (ref 70–99)
Potassium: 3.3 mmol/L — ABNORMAL LOW (ref 3.5–5.1)
Sodium: 139 mmol/L (ref 135–145)

## 2021-07-26 LAB — CBC WITH DIFFERENTIAL/PLATELET
Abs Immature Granulocytes: 0.03 10*3/uL (ref 0.00–0.07)
Basophils Absolute: 0.1 10*3/uL (ref 0.0–0.1)
Basophils Relative: 1 %
Eosinophils Absolute: 0.2 10*3/uL (ref 0.0–0.5)
Eosinophils Relative: 4 %
HCT: 37.5 % (ref 36.0–46.0)
Hemoglobin: 11.2 g/dL — ABNORMAL LOW (ref 12.0–15.0)
Immature Granulocytes: 1 %
Lymphocytes Relative: 21 %
Lymphs Abs: 1.2 10*3/uL (ref 0.7–4.0)
MCH: 26.4 pg (ref 26.0–34.0)
MCHC: 29.9 g/dL — ABNORMAL LOW (ref 30.0–36.0)
MCV: 88.2 fL (ref 80.0–100.0)
Monocytes Absolute: 0.9 10*3/uL (ref 0.1–1.0)
Monocytes Relative: 15 %
Neutro Abs: 3.3 10*3/uL (ref 1.7–7.7)
Neutrophils Relative %: 58 %
Platelets: 228 10*3/uL (ref 150–400)
RBC: 4.25 MIL/uL (ref 3.87–5.11)
RDW: 16.7 % — ABNORMAL HIGH (ref 11.5–15.5)
WBC: 5.7 10*3/uL (ref 4.0–10.5)
nRBC: 0 % (ref 0.0–0.2)

## 2021-07-30 ENCOUNTER — Encounter: Payer: Self-pay | Admitting: Vascular Surgery

## 2021-07-30 NOTE — Progress Notes (Signed)
?Perioperative Services ? ?Pre-Admission/Anesthesia Testing Clinical Review ? ?Date: 07/30/21 ? ?Patient Demographics:  ?Name: Kelli Nguyen ?DOB:   February 27, 1946 ?MRN:   [id number] ? ?Planned Surgical Procedure(s):  ? ? Case: [id number] Date/Time: 08/03/21 0957  ? Procedure: BANDING HERO GRAFT (Right)  ? Anesthesia type: General  ? Pre-op diagnosis: ESRD  ? Location: [facility] OR ROOM 08 / [facility] ORS FOR ANESTHESIA GROUP  ? Surgeons: Katha Cabal, MD  ? ?NOTE: Available PAT nursing documentation and vital signs have been reviewed. Clinical nursing staff has updated patient's PMH/PSHx, current medication list, and drug allergies/intolerances to ensure comprehensive history available to assist in medical decision making as it pertains to the aforementioned surgical procedure and anticipated anesthetic course. Extensive review of available clinical information performed. [name] PMH and PSHx updated with any diagnoses/procedures that  may have been inadvertently omitted during her intake with the pre-admission testing department's nursing staff. ? ?Clinical Discussion:  ?Kelli Nguyen is a 76 y.o. female who is submitted for pre-surgical anesthesia review and clearance prior to her undergoing the above procedure. Patient has never been a smoker. Pertinent PMH includes: CAD, pulmonary hypertension, PAD, RBBB, first degree AV block, murmur, diastolic dysfunction, HTN, HLD, T2DM, secondary hyperparathyroidism, ESRD on dialysis, GERD (on daily PPI), anemia, OA, polyneuropathy ? ?Patient is followed by cardiology (End, MD). She was last seen in the cardiology clinic on 07/25/2021; notes reviewed.  At the time of her clinic visit, patient doing well overall from a cardiovascular perspective.  She denied any chest pain, shortness of breath, PND, orthopnea, palpitations, vertiginous symptoms, or presyncope/syncope.  Patient with chronic peripheral edema that was noted to be stable and at baseline.  Patient with past medical  history significant for cardiovascular diagnoses. ? ?Patient underwent a three-vessel CABG at [facility] in 2008 with Dr. Meade Maw, MD. Records regarding this procedure not readily available at time of consult. Information obtained from patient, [facility] EMR, and notes from current cardiologist.  LIMA-LAD, SVG-OM1, and SVG-PDA bypass grafts were placed. ? ?Myocardial perfusion imaging study performed on 12/01/2013 revealed a moderate in size, severe, partially reversible perfusion defect in the apical, apical inferior, and apical lateral segments consistent with scar and peri-infarct ischemia.  Systolic function was globally normal with an EF of >65%. ? ?TTE performed on 12/03/2013 revealed an LVEF of 55%.  There were regional wall motion abnormalities, LVH, segmental contractile left ventricular dysfunction, degenerative mitral valve disease, a dilated left atrium, and aortic sclerosis without evidence of stenosis. ? ?Diagnostic left heart catheterization was performed on 02/26/2014 revealing multivessel native CAD; 70% stenosis in the distal LM, 80% stenosis in the proximal LAD, 70-80% stenosis in the mid LCx, and chronic CTO of the RCA.  SVG-OM1, and SVG-PDA bypass grafts were patent; LIMA-LAD graft with 80-90% stenosis at the anastomosis and also and 80-90% stenosis in the distal LAD after the anastomosis.  LVEDP elevated at 16 mmHg. ? ?Last TTE was performed on 09/20/2020 revealing a normal left ventricular systolic function with an EF of 55-60%.  Diastolic Doppler parameters consistent with pseudonormalization (G2DD).  Low normal right ventricular systolic function.  RVSP moderately elevated at 40.7 mmHg.  There was mild tricuspid valve regurgitation. ? ?Last myocardial perfusion imaging study performed on 09/20/2020 demonstrated a hyperdynamic left ventricular systolic function with an EF >65%.  There was no evidence of stress-induced myocardial ischemia or arrhythmia.  Study determined to be normal and low  risk. ? ?Patient has a significant vascular history requiring multiple peripheral vascular catheterizations and  thrombectomy procedures within her AV fistula/shunt.  For this reason, patient remains on daily DAPT therapy (ASA + clopidogrel).  Patient is reportedly compliant with therapy with no evidence or reports of GI bleeding.  Blood pressure elevated at 168/81; takes no medications. Patient is on a statin for her HLD. T2DM well controlled with diet lifestyle modification; last Hgb A1c 5.2% when checked on 11/13/2020. Functional capacity, as defined by DASI, is documented as being >/= 4 METS.  No changes were made to patient's medication regimen.  No changes were made to her medication regimen.  Patient follow-up with outpatient cardiology in 6 months or sooner if needed. ? ?Patient is scheduled for a HERO GRAFT BANDING procedure on 08/03/2021 with Dr. Hortencia Pilar, MD.  Given patient's past medical history significant for cardiovascular diagnoses, presurgical cardiac clearance was sought by the PAT team.  Per cardiology, "patient doing well with no symptoms concerning for angina or decompensation.  Recent Lexiscan MPI, less than 12 months ago, showed no evidence of ischemia, and was overall low risk.  RCRI indicated a 6.6% risk of MACE in the peri-procedural timeframe. She may proceed with noncardiac surgery at an overall MODERATE risk". Again, patient is on daily DAPT therapy.  She has been instructed on recommendations from cardiology and vascular surgery for holding her clopidogrel for 3 days prior to procedure with plans to restart since postoperatively respectively minimized by primary attending surgeon.  Patient is aware that her last dose of clopidogrel should be on 07/30/2021.  Patient will continue her daily low-dose ASA throughout the perioperative course. ? ?Patient denies previous perioperative complications with anesthesia in the past. In review of the available records, it is noted that patient  underwent a general anesthetic course here (ASA IV) in 11/2020 without documented complications.  ? ? ?  07/25/2021  ?  2:29 PM 07/24/2021  ? 11:37 AM 07/09/2021  ? 10:33 AM  ?Vitals with BMI  ?Height '4\' 11"'$  '4\' 11"'$    ?Weight 152 lbs 2 oz 153 lbs 153 lbs 13 oz  ?BMI 30.71 30.89 31.05  ?Systolic 749  449  ?Diastolic 81  74  ?Pulse 75  76  ? ? ?Providers/Specialists:  ? ?NOTE: Primary physician provider listed below. Patient may have been seen by APP or partner within same practice.  ? ?PROVIDER ROLE / SPECIALTY LAST OV  ?Schnier, Dolores Lory, MD Vascular Surgery 07/09/2021  ?Crissie Figures, PA-C Primary Care Provider ???  ?Nelva Bush, MD Cardiology 07/25/2021  ?Trinda Pascal, MD Nephrology 07/26/2021  ? ?Allergies:  ?Gadolinium derivatives, Penicillins, Codeine, Codeine sulfate, Contrast media [iodinated contrast media], Gadobutrol, Metrizamide, and Statins ? ?Current Home Medications:  ? ?No current facility-administered medications for this encounter.  ? ? acetaminophen (TYLENOL) 500 MG tablet  ? aspirin 81 MG tablet  ? cephALEXin (KEFLEX) 500 MG capsule  ? cinacalcet (SENSIPAR) 30 MG tablet  ? clopidogrel (PLAVIX) 75 MG tablet  ? diclofenac Sodium (VOLTAREN) 1 % GEL  ? docusate sodium (COLACE) 100 MG capsule  ? latanoprost (XALATAN) 0.005 % ophthalmic solution  ? midodrine (PROAMATINE) 5 MG tablet  ? pantoprazole (PROTONIX) 40 MG tablet  ? pravastatin (PRAVACHOL) 20 MG tablet  ? sevelamer carbonate (RENVELA) 800 MG tablet  ? timolol (TIMOPTIC) 0.5 % ophthalmic solution  ? ?History:  ? ?Past Medical History:  ?Diagnosis Date  ? Anemia   ? Carpal tunnel syndrome of left wrist   ? Cataract, bilateral   ? Chronic cough   ? Closed displaced fracture of left femoral neck (  [place]) 03/09/2017  ? Coronary artery disease   ? a.) s/p 3v CABG (LIMA-LAD, SVG-OM1, SVG-PDA) in 2008. b.) LHC 02/26/2014: 70% dLM. 80% pLAD, 70-80% mLCx, chronic CTO in the RCA; SVG-OM1 and SVG-PDA patent; 80-90% LIMA-LAD at the anastamosis and 80-90% at  dLAD after anastamosis; very tortous LEFT subclavian aftery LIMA takeoff (difficult engagement of LIMA via femoral approach).  ? Diabetes mellitus without complication ([place])   ? Diastolic dysfunction   ? a.)

## 2021-08-01 ENCOUNTER — Telehealth (INDEPENDENT_AMBULATORY_CARE_PROVIDER_SITE_OTHER): Payer: Self-pay

## 2021-08-01 NOTE — Telephone Encounter (Signed)
Spoke with the patient and informed her that due to an unfortunate accident Dr. Lucky Cowboy will be performing her surgery and not Dr. Delana Meyer. Patient was understanding and fine with the change.  ?

## 2021-08-02 MED ORDER — ORAL CARE MOUTH RINSE: 15.0000 mL | Freq: Once | OROMUCOSAL | Status: DC

## 2021-08-02 MED ORDER — CHLORHEXIDINE GLUCONATE CLOTH 2 % EX PADS: 6.0000 | MEDICATED_PAD | Freq: Once | CUTANEOUS | Status: DC

## 2021-08-02 MED ORDER — SODIUM CHLORIDE 0.9 % IV SOLN
INTRAVENOUS | Status: DC
Start: 2021-08-02 — End: 2021-08-03

## 2021-08-02 MED ORDER — CHLORHEXIDINE GLUCONATE 0.12 % MT SOLN: 15.0000 mL | Freq: Once | OROMUCOSAL | Status: DC

## 2021-08-02 MED ORDER — VANCOMYCIN HCL IN DEXTROSE 1-5 GM/200ML-% IV SOLN: 1000.0000 mg | INTRAVENOUS | Status: AC

## 2021-08-03 ENCOUNTER — Encounter: Payer: Self-pay | Admitting: Vascular Surgery

## 2021-08-03 ENCOUNTER — Other Ambulatory Visit: Payer: Self-pay

## 2021-08-03 ENCOUNTER — Ambulatory Visit: Payer: Medicare Other | Admitting: Urgent Care

## 2021-08-03 ENCOUNTER — Encounter
Admission: RE | Disposition: A | Discharge status: Home / Self Care | Payer: Self-pay | Source: Home / Self Care | Attending: Vascular Surgery

## 2021-08-03 ENCOUNTER — Ambulatory Visit
Admission: RE | Admit: 2021-08-03 | Discharge: 2021-08-03 | Disposition: A | Discharge status: Home / Self Care | Payer: Medicare Other | Attending: Vascular Surgery | Admitting: Vascular Surgery

## 2021-08-03 DIAGNOSIS — N186 End stage renal disease: Secondary | ICD-10-CM | POA: Diagnosis not present

## 2021-08-03 DIAGNOSIS — Y841 Kidney dialysis as the cause of abnormal reaction of the patient, or of later complication, without mention of misadventure at the time of the procedure: Secondary | ICD-10-CM | POA: Insufficient documentation

## 2021-08-03 DIAGNOSIS — I272 Pulmonary hypertension, unspecified: Secondary | ICD-10-CM | POA: Insufficient documentation

## 2021-08-03 DIAGNOSIS — E785 Hyperlipidemia, unspecified: Secondary | ICD-10-CM | POA: Diagnosis not present

## 2021-08-03 DIAGNOSIS — I25118 Atherosclerotic heart disease of native coronary artery with other forms of angina pectoris: Secondary | ICD-10-CM | POA: Diagnosis not present

## 2021-08-03 DIAGNOSIS — T82898A Other specified complication of vascular prosthetic devices, implants and grafts, initial encounter: Secondary | ICD-10-CM | POA: Insufficient documentation

## 2021-08-03 DIAGNOSIS — E1122 Type 2 diabetes mellitus with diabetic chronic kidney disease: Secondary | ICD-10-CM | POA: Insufficient documentation

## 2021-08-03 DIAGNOSIS — E1151 Type 2 diabetes mellitus with diabetic peripheral angiopathy without gangrene: Secondary | ICD-10-CM | POA: Insufficient documentation

## 2021-08-03 DIAGNOSIS — Z951 Presence of aortocoronary bypass graft: Secondary | ICD-10-CM | POA: Insufficient documentation

## 2021-08-03 DIAGNOSIS — Z992 Dependence on renal dialysis: Secondary | ICD-10-CM

## 2021-08-03 DIAGNOSIS — I120 Hypertensive chronic kidney disease with stage 5 chronic kidney disease or end stage renal disease: Secondary | ICD-10-CM | POA: Insufficient documentation

## 2021-08-03 DIAGNOSIS — N2581 Secondary hyperparathyroidism of renal origin: Secondary | ICD-10-CM | POA: Insufficient documentation

## 2021-08-03 DIAGNOSIS — E1140 Type 2 diabetes mellitus with diabetic neuropathy, unspecified: Secondary | ICD-10-CM | POA: Diagnosis not present

## 2021-08-03 HISTORY — DX: Other ill-defined heart diseases: I51.89

## 2021-08-03 HISTORY — DX: Atrioventricular block, first degree: I44.0

## 2021-08-03 LAB — GLUCOSE, CAPILLARY
Glucose-Capillary: 73 mg/dL (ref 70–99)
Glucose-Capillary: 117 mg/dL — ABNORMAL HIGH (ref 70–99)

## 2021-08-03 LAB — POCT I-STAT, CHEM 8
BUN: 41 mg/dL — ABNORMAL HIGH (ref 8–23)
Calcium, Ion: 0.89 mmol/L — CL (ref 1.15–1.40)
Chloride: 98 mmol/L (ref 98–111)
Creatinine, Ser: 6.7 mg/dL — ABNORMAL HIGH (ref 0.44–1.00)
Glucose, Bld: 98 mg/dL (ref 70–99)
HCT: 38 % (ref 36.0–46.0)
Hemoglobin: 12.9 g/dL (ref 12.0–15.0)
Potassium: 5.2 mmol/L — ABNORMAL HIGH (ref 3.5–5.1)
Sodium: 137 mmol/L (ref 135–145)
TCO2: 32 mmol/L (ref 22–32)

## 2021-08-03 SURGERY — BANDING HERO GRAFT
Anesthesia: General | Site: Arm Upper | Laterality: Right

## 2021-08-03 MED ORDER — DEXAMETHASONE SODIUM PHOSPHATE 10 MG/ML IJ SOLN
INTRAMUSCULAR | Status: DC | PRN
  Administered 2021-08-03: 10 mg via INTRAVENOUS

## 2021-08-03 MED ORDER — HYDROMORPHONE HCL 1 MG/ML IJ SOLN: 1.0000 mg | Freq: Once | INTRAMUSCULAR | Status: DC | PRN

## 2021-08-03 MED ORDER — 0.9 % SODIUM CHLORIDE (POUR BTL) OPTIME
TOPICAL | Status: DC | PRN
  Administered 2021-08-03: 1000 mL

## 2021-08-03 MED ORDER — PROPOFOL 10 MG/ML IV BOLUS
INTRAVENOUS | Status: AC
  Filled 2021-08-03: qty 20

## 2021-08-03 MED ORDER — DEXTROSE 50 % IV SOLN
25.0000 mL | INTRAVENOUS | Status: AC
  Administered 2021-08-03: 25 mL via INTRAVENOUS

## 2021-08-03 MED ORDER — DIPHENHYDRAMINE HCL 50 MG/ML IJ SOLN
INTRAMUSCULAR | Status: AC
  Filled 2021-08-03: qty 1

## 2021-08-03 MED ORDER — ONDANSETRON HCL 4 MG/2ML IJ SOLN: 4.0000 mg | Freq: Four times a day (QID) | INTRAMUSCULAR | Status: DC | PRN

## 2021-08-03 MED ORDER — DEXTROSE 50 % IV SOLN
INTRAVENOUS | Status: AC
  Filled 2021-08-03: qty 50

## 2021-08-03 MED ORDER — PROPOFOL 10 MG/ML IV BOLUS
INTRAVENOUS | Status: DC | PRN
  Administered 2021-08-03: 100 mg via INTRAVENOUS

## 2021-08-03 MED ORDER — PHENYLEPHRINE HCL-NACL 20-0.9 MG/250ML-% IV SOLN
INTRAVENOUS | Status: DC | PRN
  Administered 2021-08-03: 80 ug/min via INTRAVENOUS

## 2021-08-03 MED ORDER — PHENYLEPHRINE HCL (PRESSORS) 10 MG/ML IV SOLN
INTRAVENOUS | Status: AC
  Filled 2021-08-03: qty 1

## 2021-08-03 MED ORDER — PHENYLEPHRINE HCL (PRESSORS) 10 MG/ML IV SOLN
INTRAVENOUS | Status: AC
Start: 2021-08-03 — End: ?
  Filled 2021-08-03: qty 1

## 2021-08-03 MED ORDER — FENTANYL CITRATE (PF) 100 MCG/2ML IJ SOLN
INTRAMUSCULAR | Status: AC
  Filled 2021-08-03: qty 2

## 2021-08-03 MED ORDER — FENTANYL CITRATE (PF) 100 MCG/2ML IJ SOLN
INTRAMUSCULAR | Status: DC | PRN
  Administered 2021-08-03: 50 ug via INTRAVENOUS

## 2021-08-03 MED ORDER — VANCOMYCIN HCL IN DEXTROSE 1-5 GM/200ML-% IV SOLN
INTRAVENOUS | Status: AC
  Administered 2021-08-03: 1000 mg via INTRAVENOUS
  Filled 2021-08-03: qty 200

## 2021-08-03 MED ORDER — EPHEDRINE SULFATE (PRESSORS) 50 MG/ML IJ SOLN
INTRAMUSCULAR | Status: DC | PRN
  Administered 2021-08-03: 5 mg via INTRAVENOUS

## 2021-08-03 MED ORDER — VASOPRESSIN 20 UNIT/ML IV SOLN
INTRAVENOUS | Status: DC | PRN
  Administered 2021-08-03: 2 [IU] via INTRAVENOUS
  Administered 2021-08-03 (×2): 1 [IU] via INTRAVENOUS

## 2021-08-03 MED ORDER — LIDOCAINE HCL (CARDIAC) PF 100 MG/5ML IV SOSY
PREFILLED_SYRINGE | INTRAVENOUS | Status: DC | PRN
Start: 2021-08-03 — End: 2021-08-03
  Administered 2021-08-03: 50 mg via INTRAVENOUS

## 2021-08-03 MED ORDER — CHLORHEXIDINE GLUCONATE 0.12 % MT SOLN
OROMUCOSAL | Status: AC
  Filled 2021-08-03: qty 15

## 2021-08-03 MED ORDER — DIPHENHYDRAMINE HCL 50 MG/ML IJ SOLN
25.0000 mg | INTRAMUSCULAR | Status: AC
  Administered 2021-08-03: 25 mg via INTRAVENOUS

## 2021-08-03 MED ORDER — ONDANSETRON HCL 4 MG/2ML IJ SOLN
INTRAMUSCULAR | Status: DC | PRN
  Administered 2021-08-03: 4 mg via INTRAVENOUS

## 2021-08-03 SURGICAL SUPPLY — 35 items
BLADE SURG SZ11 CARB STEEL (BLADE) ×2 IMPLANT
BOOT SUTURE AID YELLOW STND (SUTURE) ×2 IMPLANT
BRUSH SCRUB EZ  4% CHG (MISCELLANEOUS) ×1
BRUSH SCRUB EZ 4% CHG (MISCELLANEOUS) ×1 IMPLANT
CHLORAPREP W/TINT 26 (MISCELLANEOUS) ×2 IMPLANT
DERMABOND ADVANCED (GAUZE/BANDAGES/DRESSINGS) ×1
DERMABOND ADVANCED .7 DNX12 (GAUZE/BANDAGES/DRESSINGS) ×1 IMPLANT
ELECT CAUTERY BLADE 6.4 (BLADE) ×2 IMPLANT
ELECT REM PT RETURN 9FT ADLT (ELECTROSURGICAL) ×2
ELECTRODE REM PT RTRN 9FT ADLT (ELECTROSURGICAL) ×1 IMPLANT
GLOVE SURG SYN 7.0 (GLOVE) ×2 IMPLANT
GLOVE SURG SYN 7.0 PF PI (GLOVE) ×1 IMPLANT
GOWN STRL REUS W/ TWL LRG LVL3 (GOWN DISPOSABLE) ×1 IMPLANT
GOWN STRL REUS W/ TWL XL LVL3 (GOWN DISPOSABLE) ×1 IMPLANT
GOWN STRL REUS W/TWL LRG LVL3 (GOWN DISPOSABLE) ×1
GOWN STRL REUS W/TWL XL LVL3 (GOWN DISPOSABLE) ×1
HEMOSTAT SURGICEL 2X3 (HEMOSTASIS) ×2 IMPLANT
KIT TURNOVER KIT A (KITS) ×2 IMPLANT
LABEL OR SOLS (LABEL) ×2 IMPLANT
LOOP RED MAXI  1X406MM (MISCELLANEOUS) ×1
LOOP VESSEL MAXI 1X406 RED (MISCELLANEOUS) ×1 IMPLANT
LOOP VESSEL MINI 0.8X406 BLUE (MISCELLANEOUS) ×1 IMPLANT
LOOPS BLUE MINI 0.8X406MM (MISCELLANEOUS) ×1
MANIFOLD NEPTUNE II (INSTRUMENTS) ×2 IMPLANT
PACK EXTREMITY ARMC (MISCELLANEOUS) ×2 IMPLANT
PAD PREP 24X41 OB/GYN DISP (PERSONAL CARE ITEMS) ×2 IMPLANT
STOCKINETTE 48X4 2 PLY STRL (GAUZE/BANDAGES/DRESSINGS) ×1 IMPLANT
STOCKINETTE STRL 4IN 9604848 (GAUZE/BANDAGES/DRESSINGS) ×2 IMPLANT
SUT MNCRL AB 4-0 PS2 18 (SUTURE) ×2 IMPLANT
SUT PROLENE 6 0 BV (SUTURE) ×2 IMPLANT
SUT SILK 0 (SUTURE) ×1
SUT SILK 0 30XBRD TIE 6 (SUTURE) ×1 IMPLANT
SUT VIC AB 3-0 SH 27 (SUTURE) ×1
SUT VIC AB 3-0 SH 27X BRD (SUTURE) ×1 IMPLANT
WATER STERILE IRR 500ML POUR (IV SOLUTION) ×4 IMPLANT

## 2021-08-03 NOTE — Discharge Instructions (Signed)

## 2021-08-03 NOTE — Anesthesia Postprocedure Evaluation (Signed)
Anesthesia Post Note ? ?Patient: Kelli Nguyen ? ?Procedure(s) Performed: BANDING HERO GRAFT (Right: Arm Upper) ? ?Patient location during evaluation: PACU ?Anesthesia Type: General ?Level of consciousness: awake and alert, oriented and patient cooperative ?Pain management: pain level controlled ?Vital Signs Assessment: post-procedure vital signs reviewed and stable ?Respiratory status: spontaneous breathing, nonlabored ventilation and respiratory function stable ?Cardiovascular status: blood pressure returned to baseline and stable ?Postop Assessment: adequate PO intake ?Anesthetic complications: no ? ? ?No notable events documented. ? ? ?Last Vitals:  ?Vitals:  ? 08/03/21 1200 08/03/21 1226  ?BP: 126/61 (!) 145/63  ?Pulse: (!) 59 64  ?Resp: 18 16  ?Temp: (!) 35.8 ?C (!) 36.1 ?C  ?SpO2: 99% 100%  ?  ?Last Pain:  ?Vitals:  ? 08/03/21 1226  ?TempSrc: Temporal  ?PainSc: 0-No pain  ? ? ?  ?  ?  ?  ?  ?  ? ?Darrin Nipper ? ? ? ? ?

## 2021-08-03 NOTE — Transfer of Care (Addendum)
Immediate Anesthesia Transfer of Care Note ? ?Patient: Kelli Nguyen ? ?Procedure(s) Performed: BANDING HERO GRAFT (Right: Arm Upper) ? ?Patient Location: PACU ? ?Anesthesia Type:General ? ?Level of Consciousness: awake ? ?Airway & Oxygen Therapy: Patient Spontanous Breathing ? ?Post-op Assessment: Report given to RN ? ?Post vital signs: stable ? ?Last Vitals:  ?Vitals Value Taken Time  ?BP    ?Temp    ?Pulse    ?Resp    ?SpO2    ? ? ?Last Pain:  ?Vitals:  ? 08/03/21 0800  ?TempSrc: Oral  ?   ? ?  ? ?Complications: No notable events documented. ?

## 2021-08-03 NOTE — Anesthesia Preprocedure Evaluation (Signed)
Anesthesia Evaluation  ?Patient identified by MRN, date of birth, ID band ?Patient awake ? ? ? ?Reviewed: ?Allergy & Precautions, NPO status , Patient's Chart, lab work & pertinent test results ? ?History of Anesthesia Complications ?Negative for: history of anesthetic complications ? ?Airway ?Mallampati: II ? ? ?Neck ROM: Full ? ? ? Dental ? ?(+) Edentulous Upper, Edentulous Lower ?  ?Pulmonary ?neg pulmonary ROS,  ?  ?Pulmonary exam normal ?breath sounds clear to auscultation ? ? ? ? ? ? Cardiovascular ?hypertension, + CAD (s/p CABG) and + Peripheral Vascular Disease (on Plavix, last dose 07/30/21)  ? ?Rhythm:Regular Rate:Normal ? ?ECG 07/25/21:  ?NSR, 75 bpm, first-degree AV block, low voltage QRS, RBBB ? ?Echo 09/20/20:  ?1. Left ventricular ejection fraction, by estimation, is 55 to 60%. The left ventricle has normal function. The left ventricle has no regional wall motion abnormalities. Left ventricular diastolic parameters are consistent with Grade II diastolic dysfunction (pseudonormalization).  ??2. Right ventricular systolic function is low normal. The right ventricular size is normal. There is moderately elevated pulmonary artery systolic pressure. The estimated right ventricular systolic pressure is 01.0 mmHg.  ??3. The mitral valve was not well visualized. No evidence of mitral valve regurgitation.  ??4. The aortic valve was not well visualized. Aortic valve regurgitation is not visualized.  ??5. The inferior vena cava is normal in size with greater than 50% respiratory variability, suggesting right atrial pressure of 3 mmHg. ?  ?Neuro/Psych ? Headaches,   ? GI/Hepatic ?GERD  ,  ?Endo/Other  ?diabetes, Type 2Obesity  ? Renal/GU ?ESRF and DialysisRenal disease (last HD 08/02/21)  ? ?  ?Musculoskeletal ? ?(+) Arthritis ,  ? Abdominal ?  ?Peds ? Hematology ? ?(+) Blood dyscrasia, anemia ,   ?Anesthesia Other Findings ?Reviewed and agree with Bayard Males pre-anesthesia  clinical review note. ? ?Cardiology note 07/25/21:  ?1. Preoperative cardiac risk stratification: She is doing well without symptoms concerning for angina or decompensation.  Recent Lexiscan MPI, less than 12 months ago, showed no evidence of ischemia and was overall low risk.  She may proceed with noncardiac surgery at an overall moderate risk.  Patient is prescribed clopidogrel by vascular surgery, due for management of this medication in the perioperative timeframe to them.  She was advised to contact the prescribing provider's office for preoperative recommendations regarding antiplatelet therapy.  From a cardiac perspective, would prefer she be maintained on aspirin 81 mg daily throughout the periprocedural timeframe, unless the bleeding risk is deemed to be too great by the treatment team. ?? ?2. CAD status post CABG without angina: She is doing well without symptoms concerning for angina. Lexiscan MPI in 09/2020 was without evidence of ischemia and overall low risk.  She remains on ASA 81 mg daily.  Intolerant to high intensity statins.  She remains on pravastatin.  No indication for further ischemic testing at this time. ?? ?3. Pulmonary hypertension: Symptoms stable.  Volume management per hemodialysis. ?? ?4. HLD: No LDL on file for review.  She is intolerant to high intensity statins.  Tolerating pravastatin.  In follow-up, recommend obtaining fasting lipid panel. ?? ?5. ESRD on HD: Felt to be due to hypertension and diabetes.  Dialysis per nephrology.  Access per vascular surgery. ?? ?6. DM2 with vascular disease: Ongoing management per PCP and vascular surgery. ?? ?Disposition: F/u with Dr. Saunders Revel or an APP in 6 months. ? Reproductive/Obstetrics ? ?  ? ? ? ? ? ? ? ? ? ? ? ? ? ?  ?  ? ? ? ? ? ? ? ? ?  Anesthesia Physical ?Anesthesia Plan ? ?ASA: 4 ? ?Anesthesia Plan: General  ? ?Post-op Pain Management:   ? ?Induction: Intravenous ? ?PONV Risk Score and Plan: 3 and Ondansetron, Dexamethasone and Treatment may  vary due to age or medical condition ? ?Airway Management Planned: Oral ETT ? ?Additional Equipment:  ? ?Intra-op Plan:  ? ?Post-operative Plan: Extubation in OR ? ?Informed Consent: I have reviewed the patients History and Physical, chart, labs and discussed the procedure including the risks, benefits and alternatives for the proposed anesthesia with the patient or authorized representative who has indicated his/her understanding and acceptance.  ? ? ? ?Dental advisory given ? ?Plan Discussed with: CRNA ? ?Anesthesia Plan Comments: (Patient consented for risks of anesthesia including but not limited to:  ?- adverse reactions to medications ?- damage to eyes, teeth, lips or other oral mucosa ?- nerve damage due to positioning  ?- sore throat or hoarseness ?- damage to heart, brain, nerves, lungs, other parts of body or loss of life ? ?Informed patient about role of CRNA in peri- and intra-operative care.  Patient voiced understanding.)  ? ? ? ? ? ? ?Anesthesia Quick Evaluation ? ?

## 2021-08-03 NOTE — Anesthesia Procedure Notes (Signed)
Procedure Name: LMA Insertion ?Date/Time: 08/03/2021 10:35 AM ?Performed by: Kerri Perches, CRNA ?Pre-anesthesia Checklist: Patient identified, Patient being monitored, Timeout performed, Emergency Drugs available and Suction available ?Patient Re-evaluated:Patient Re-evaluated prior to induction ?Oxygen Delivery Method: Circle system utilized ?Preoxygenation: Pre-oxygenation with 100% oxygen ?Induction Type: IV induction ?LMA: LMA inserted ?LMA Size: 3.0 ?Tube type: Oral ?Number of attempts: 1 ?Placement Confirmation: positive ETCO2 and breath sounds checked- equal and bilateral ?Tube secured with: Tape ?Dental Injury: Teeth and Oropharynx as per pre-operative assessment  ? ? ? ? ?

## 2021-08-03 NOTE — Op Note (Signed)
[facility] ? ? ?OPERATIVE NOTE ? ?DATE: 08/03/2021 ? ?PRE-OPERATIVE DIAGNOSIS: ESRD, steal with right arm HeRO graft ? ?POST-OPERATIVE DIAGNOSIS: same as above ? ?PROCEDURE: ?1.   Banding of right arm HeRO graft ? ?SURGEON: Leotis Pain, MD ? ?ASSISTANT(S): None ? ?ANESTHESIA: none ? ?ESTIMATED BLOOD LOSS: minimal ? ?FINDING(S): ?1.  none ? ?SPECIMEN(S):  None ? ?INDICATIONS:   ?Patient is a 76 y.o.female who presents with steal syndrome with a right arm HeRO graft. Risks and benefits were discussed and the patient was agreeable to proceed. ? ?DESCRIPTION: ?After obtaining full informed written consent, the patient was brought back to the operating room and placed supine upon the operating table.  The patient received IV antibiotics prior to induction.  After obtaining adequate anesthesia, the patient was prepped and draped in the standard fashion. I created a small transverse incision in the mid to distal upper arm overlying the HeRO graft.  The graft was dissected out.  In that area, a pair of 0 silk ties were used to band the HeRO graft and reduce its size to about 3-4 mm.  There was still a thrill present proximal to the banding, but the graft was clearly narrowed and the flow reduced.  The wound was then irrigated and closed with a 3-0 Vicryl and a 4-0 Monocryl. Dermabond was placed as a dressing. The patient was taken to the recovery room in stable condition having tolerated the procedure well. ? ?COMPLICATIONS: None ? ?CONDITION: Stable ? ? ?Leotis Pain ?08/03/2021 ?11:31 AM ? ?This note was created with Dragon Medical transcription system. Any errors in dictation are purely unintentional.  ?

## 2021-08-03 NOTE — Interval H&P Note (Signed)
History and Physical Interval Note: ? ?08/03/2021 ?9:49 AM ? ?Kelli Nguyen  has presented today for surgery, with the diagnosis of ESRD.  The various methods of treatment have been discussed with the patient and family. After consideration of risks, benefits and other options for treatment, the patient has consented to  Procedure(s): ?BANDING HERO GRAFT (Right) as a surgical intervention.  The patient's history has been reviewed, patient examined, no change in status, stable for surgery.  I have reviewed the patient's chart and labs.  Questions were answered to the patient's satisfaction.   ? ? ?Leotis Pain ? ? ?

## 2021-08-06 ENCOUNTER — Other Ambulatory Visit: Payer: Self-pay

## 2021-08-06 ENCOUNTER — Encounter
Admission: RE | Disposition: A | Discharge status: Home / Self Care | Payer: Self-pay | Source: Ambulatory Visit | Attending: Vascular Surgery

## 2021-08-06 ENCOUNTER — Ambulatory Visit
Admission: RE | Admit: 2021-08-06 | Discharge: 2021-08-06 | Disposition: A | Discharge status: Home / Self Care | Payer: Medicare Other | Source: Ambulatory Visit | Attending: Vascular Surgery | Admitting: Vascular Surgery

## 2021-08-06 ENCOUNTER — Encounter: Payer: Self-pay | Admitting: Vascular Surgery

## 2021-08-06 DIAGNOSIS — I82C12 Acute embolism and thrombosis of left internal jugular vein: Secondary | ICD-10-CM | POA: Diagnosis not present

## 2021-08-06 DIAGNOSIS — E1122 Type 2 diabetes mellitus with diabetic chronic kidney disease: Secondary | ICD-10-CM | POA: Diagnosis not present

## 2021-08-06 DIAGNOSIS — E785 Hyperlipidemia, unspecified: Secondary | ICD-10-CM | POA: Diagnosis not present

## 2021-08-06 DIAGNOSIS — Z992 Dependence on renal dialysis: Secondary | ICD-10-CM | POA: Diagnosis not present

## 2021-08-06 DIAGNOSIS — T82868A Thrombosis of vascular prosthetic devices, implants and grafts, initial encounter: Secondary | ICD-10-CM | POA: Diagnosis not present

## 2021-08-06 DIAGNOSIS — I25118 Atherosclerotic heart disease of native coronary artery with other forms of angina pectoris: Secondary | ICD-10-CM | POA: Diagnosis not present

## 2021-08-06 DIAGNOSIS — I272 Pulmonary hypertension, unspecified: Secondary | ICD-10-CM | POA: Diagnosis not present

## 2021-08-06 DIAGNOSIS — E1151 Type 2 diabetes mellitus with diabetic peripheral angiopathy without gangrene: Secondary | ICD-10-CM | POA: Insufficient documentation

## 2021-08-06 DIAGNOSIS — E1140 Type 2 diabetes mellitus with diabetic neuropathy, unspecified: Secondary | ICD-10-CM | POA: Insufficient documentation

## 2021-08-06 DIAGNOSIS — I120 Hypertensive chronic kidney disease with stage 5 chronic kidney disease or end stage renal disease: Secondary | ICD-10-CM | POA: Insufficient documentation

## 2021-08-06 DIAGNOSIS — N186 End stage renal disease: Secondary | ICD-10-CM

## 2021-08-06 DIAGNOSIS — N2581 Secondary hyperparathyroidism of renal origin: Secondary | ICD-10-CM | POA: Insufficient documentation

## 2021-08-06 HISTORY — PX: DIALYSIS/PERMA CATHETER INSERTION: CATH118288

## 2021-08-06 LAB — GLUCOSE, CAPILLARY: Glucose-Capillary: 93 mg/dL (ref 70–99)

## 2021-08-06 LAB — POTASSIUM (ARMC VASCULAR LAB ONLY): Potassium (ARMC vascular lab): 5.7 — ABNORMAL HIGH (ref 3.5–5.1)

## 2021-08-06 SURGERY — DIALYSIS/PERMA CATHETER INSERTION
Anesthesia: Moderate Sedation

## 2021-08-06 MED ORDER — FENTANYL CITRATE PF 50 MCG/ML IJ SOSY
PREFILLED_SYRINGE | INTRAMUSCULAR | Status: AC
  Filled 2021-08-06: qty 1

## 2021-08-06 MED ORDER — FENTANYL CITRATE (PF) 100 MCG/2ML IJ SOLN
INTRAMUSCULAR | Status: DC | PRN
  Administered 2021-08-06: 50 ug via INTRAVENOUS

## 2021-08-06 MED ORDER — MIDAZOLAM HCL 2 MG/2ML IJ SOLN
INTRAMUSCULAR | Status: AC
  Filled 2021-08-06: qty 2

## 2021-08-06 MED ORDER — CLINDAMYCIN PHOSPHATE 300 MG/50ML IV SOLN
INTRAVENOUS | Status: AC
Start: 2021-08-06 — End: 2021-08-06
  Administered 2021-08-06: 300 mg via INTRAVENOUS
  Filled 2021-08-06: qty 50

## 2021-08-06 MED ORDER — CHLORHEXIDINE GLUCONATE CLOTH 2 % EX PADS
6.0000 | MEDICATED_PAD | Freq: Once | CUTANEOUS | Status: AC
  Administered 2021-08-06: 6 via TOPICAL

## 2021-08-06 MED ORDER — MIDAZOLAM HCL 2 MG/2ML IJ SOLN
INTRAMUSCULAR | Status: DC | PRN
  Administered 2021-08-06 (×2): 1 mg via INTRAVENOUS

## 2021-08-06 MED ORDER — SODIUM CHLORIDE 0.9 % IV SOLN: INTRAVENOUS | Status: DC

## 2021-08-06 MED ORDER — METHYLPREDNISOLONE SODIUM SUCC 125 MG IJ SOLR
INTRAMUSCULAR | Status: DC | PRN
Start: 2021-08-06 — End: 2021-08-06
  Administered 2021-08-06: 125 mg via INTRAVENOUS

## 2021-08-06 MED ORDER — DIPHENHYDRAMINE HCL 50 MG/ML IJ SOLN
INTRAMUSCULAR | Status: DC | PRN
  Administered 2021-08-06: 50 mg via INTRAVENOUS

## 2021-08-06 MED ORDER — CLINDAMYCIN PHOSPHATE 300 MG/50ML IV SOLN: 300.0000 mg | Freq: Once | INTRAVENOUS | Status: AC

## 2021-08-06 MED ORDER — IODIXANOL 320 MG/ML IV SOLN
INTRAVENOUS | Status: DC | PRN
Start: 2021-08-06 — End: 2021-08-06
  Administered 2021-08-06: 5 mL

## 2021-08-06 SURGICAL SUPPLY — 12 items
BIOPATCH RED 1 DISK 7.0 (GAUZE/BANDAGES/DRESSINGS) ×1 IMPLANT
CANNULA 5F STIFF (CANNULA) ×1 IMPLANT
CATH PALIN MAXID VT KIT 19CM (CATHETERS) ×1 IMPLANT
CATH PALINDROME-P 44CM KIT (CATHETERS) ×1
COVER PROBE U/S 5X48 (MISCELLANEOUS) ×3 IMPLANT
DERMABOND ADVANCED (GAUZE/BANDAGES/DRESSINGS) ×1
DERMABOND ADVANCED .7 DNX12 (GAUZE/BANDAGES/DRESSINGS) IMPLANT
KIT CATH CHRNC PALINDROME PRCS (CATHETERS) IMPLANT
PACK ANGIOGRAPHY (CUSTOM PROCEDURE TRAY) ×1 IMPLANT
SUT MNCRL AB 4-0 PS2 18 (SUTURE) ×1 IMPLANT
SUT PROLENE 0 CT 1 30 (SUTURE) ×1 IMPLANT
TOWEL OR 17X26 4PK STRL BLUE (TOWEL DISPOSABLE) ×2 IMPLANT

## 2021-08-06 NOTE — Interval H&P Note (Signed)
History and Physical Interval Note: ? ?08/06/2021 ?2:17 PM ? ?Kelli Nguyen  has presented today for surgery, with the diagnosis of Perm Cath Insertion   End Stage Renal.  The various methods of treatment have been discussed with the patient and family. After consideration of risks, benefits and other options for treatment, the patient has consented to  Procedure(s): ?DIALYSIS/PERMA CATHETER INSERTION (N/A) as a surgical intervention.  The patient's history has been reviewed, patient examined, no change in status, stable for surgery.  I have reviewed the patient's chart and labs.  Her right arm hero graft appears to have thrombosed and be unusable after banding, and she needs immediate dialysis access with the catheter.  Questions were answered to the patient's satisfaction.   ? ? ?Leotis Pain ? ? ?

## 2021-08-06 NOTE — Op Note (Signed)
OPERATIVE NOTE ? ? ? ?PRE-OPERATIVE DIAGNOSIS: 1. ESRD ?2.  Thrombosed right arm hero graft after banding for steal syndrome ? ?POST-OPERATIVE DIAGNOSIS: same as above with left jugular occlusion as well ? ?PROCEDURE: ?Ultrasound guidance for vascular access to the left jugular vein and right femoral  vein ?Left jugular venogram ?Fluoroscopic guidance for placement of catheter ?Placement of a 44 cm tip to cuff tunneled hemodialysis catheter via the right femoral vein ? ?SURGEON: Leotis Pain, MD ? ?ANESTHESIA:  Local with moderate conscious sedation for approximately 49 minutes using 2 mg of Versed and 50 mcg of Fentanyl ? ?ESTIMATED BLOOD LOSS: 5 cc ? ?FINDING(S): ?1.  Patent right femoral vein ?2.  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 ?SPECIMEN(S):  None ? ?INDICATIONS:   ?Patient is a 76 y.o.female who presents with thrombosis of her right arm hero graft after banding that was performed for steal syndrome.   The patient needs long term dialysis access for their ESRD, and a Permcath is necessary.  Risks and benefits are discussed and informed consent is obtained.   ? ?DESCRIPTION: ?After obtaining full informed written consent, the patient was brought back to the vascular suited. Moderate conscious sedation was administered throughout the procedure with a face-to-face encounter with my presence for the entire procedure and with my supervision of the RN monitoring the patient's vital signs, pulse oximetry, telemetry, and mental status throughout the procedure.  The right jugular vein was not an option due to the existing hero graft and less this was to be removed and this was not prepared for today.  The left jugular vein was then visualized and in the mid neck it appeared large and patent but not easily compressible.  It was accessed under direct ultrasound guidance with a micropuncture needle and a micropuncture wire would not  advance.  A needle venogram was then performed of the left jugular vein and this was found to be occluded at the level of the clavicle.  We abandon this approach as it would not be conducive to catheter placement.  We turned our attention to femoral approach.  The patient's right groin was sterilely prepped and draped and a sterile surgical field was created.  The right femoral vein was visualized with ultrasound and found to be patent. It was then accessed under direct ultrasound guidance and a permanent image was recorded. A wire was placed. After skin nick and dilatation, the peel-away sheath was placed over the wire. ?I then turned my attention to an area about 5 cm inferior and lateral to the access incision and a small counterincision was created.  I tunneled from the counter  incision to the access site. Using fluoroscopic guidance, a 87 centimeterer tip to cuff tunneled hemodialysis catheter was selected, and tunneled from the counter  incision to the access site. It was then placed through the peel-away sheath and the peel-away sheath was removed. Using fluoroscopic guidance the catheter tips were parked in the right atrium. The appropriate distal connectors were placed. It withdrew blood well and flushed easily with heparinized saline and a concentrated heparin solution was then placed. It was secured to the leg  with 2 Prolene sutures. The access incision was closed single 4-0 Monocryl. A 4-0 Monocryl pursestring suture was placed around the exit site. Sterile dressings were placed. ?The patient tolerated the procedure well and was taken to the recovery room in stable condition. ? ?COMPLICATIONS: None ? ?CONDITION:  Stable ? ? ? ?Leotis Pain ?08/06/2021 ?4:27 PM ? ? ?This note was created with Dragon Medical transcription system. Any errors in dictation are purely unintentional.  ?

## 2021-08-07 ENCOUNTER — Encounter: Payer: Self-pay | Admitting: Vascular Surgery

## 2021-08-07 LAB — TYPE AND SCREEN
ABO/RH(D): A POS
Antibody Screen: POSITIVE
Unit division: 0
Unit division: 0

## 2021-08-07 LAB — BPAM RBC
Blood Product Expiration Date: 202304112359
Blood Product Expiration Date: 202304122359
Unit Type and Rh: 9500
Unit Type and Rh: 9500

## 2021-08-08 ENCOUNTER — Encounter: Payer: Self-pay | Admitting: Vascular Surgery

## 2021-08-20 ENCOUNTER — Other Ambulatory Visit (INDEPENDENT_AMBULATORY_CARE_PROVIDER_SITE_OTHER): Payer: Self-pay | Admitting: Vascular Surgery

## 2021-08-20 DIAGNOSIS — N186 End stage renal disease: Secondary | ICD-10-CM

## 2021-08-22 ENCOUNTER — Encounter (INDEPENDENT_AMBULATORY_CARE_PROVIDER_SITE_OTHER): Payer: Self-pay | Admitting: Nurse Practitioner

## 2021-08-22 ENCOUNTER — Other Ambulatory Visit (INDEPENDENT_AMBULATORY_CARE_PROVIDER_SITE_OTHER): Payer: Self-pay | Admitting: Vascular Surgery

## 2021-08-22 ENCOUNTER — Ambulatory Visit (INDEPENDENT_AMBULATORY_CARE_PROVIDER_SITE_OTHER): Payer: Medicare Other

## 2021-08-22 ENCOUNTER — Ambulatory Visit (INDEPENDENT_AMBULATORY_CARE_PROVIDER_SITE_OTHER): Payer: Medicare Other | Admitting: Nurse Practitioner

## 2021-08-22 ENCOUNTER — Other Ambulatory Visit (INDEPENDENT_AMBULATORY_CARE_PROVIDER_SITE_OTHER): Payer: Medicare Other

## 2021-08-22 VITALS — BP 134/74 | HR 73 | Resp 16 | Wt 154.8 lb

## 2021-08-22 DIAGNOSIS — N186 End stage renal disease: Secondary | ICD-10-CM | POA: Diagnosis not present

## 2021-08-22 DIAGNOSIS — E1159 Type 2 diabetes mellitus with other circulatory complications: Secondary | ICD-10-CM

## 2021-08-22 DIAGNOSIS — Z01818 Encounter for other preprocedural examination: Secondary | ICD-10-CM

## 2021-08-22 DIAGNOSIS — I10 Essential (primary) hypertension: Secondary | ICD-10-CM

## 2021-09-02 NOTE — Progress Notes (Signed)
? ?Subjective:  ? ? Patient ID: Kelli Nguyen, female    DOB: 1946/02/01, 76 y.o.   MRN: [id number] ?Chief Complaint  ?Patient presents with  ? Follow-up  ?  [facility] 2 week follow up  ? ? ?Kelli Nguyen is a 76 year old female that presents today for evaluation following failed upper extremity access.  The patient has exhausted all options for upper extremity access.  Her most recent upper extremity was lost due to steal syndrome.  The patient notes that she has regained sensation and ability to move upper extremity following the ligation of her right upper extremity access.  Today the patient had an ABI of 1.23 on the right and 1.22 on the left.  I suspect these numbers are falsely elevated due to medial calcification.  This is because the waveforms appear monophasic and the toe waveforms are nearly flat. ? ? ?Review of Systems  ?Musculoskeletal:  Positive for gait problem.  ?All other systems reviewed and are negative. ? ?   ?Objective:  ? Physical Exam ?Vitals reviewed.  ?HENT:  ?   Head: Normocephalic.  ?Cardiovascular:  ?   Rate and Rhythm: Normal rate.  ?   Comments: Nonpalpable pedal pulses ?Pulmonary:  ?   Effort: Pulmonary effort is normal.  ?Skin: ?   General: Skin is warm and dry.  ?Neurological:  ?   Mental Status: She is alert.  ?Psychiatric:     ?   Mood and Affect: Mood normal.     ?   Behavior: Behavior normal.     ?   Thought Content: Thought content normal.     ?   Judgment: Judgment normal.  ? ? ?BP 134/74 (BP Location: Left Arm)   Pulse 73   Resp 16   Wt 154 lb 12.8 oz (70.2 kg)   BMI 31.27 kg/m?  ? ?Past Medical History:  ?Diagnosis Date  ? Anemia   ? Carpal tunnel syndrome of left wrist   ? Cataract, bilateral   ? Chronic cough   ? Closed displaced fracture of left femoral neck ([place]) 03/09/2017  ? Coronary artery disease   ? a.) s/p 3v CABG (LIMA-LAD, SVG-OM1, SVG-PDA) in 2008. b.) LHC 02/26/2014: 70% dLM. 80% pLAD, 70-80% mLCx, chronic CTO in the RCA; SVG-OM1 and SVG-PDA patent; 80-90%  LIMA-LAD at the anastamosis and 80-90% at dLAD after anastamosis; very tortous LEFT subclavian aftery LIMA takeoff (difficult engagement of LIMA via femoral approach).  ? Diabetes mellitus without complication ([place])   ? Diastolic dysfunction   ? a.) TTE 09/20/2020: EF 55-60%; low normal RVSF; RVSP elevated at 40.7 mmHg; mild TR; G2DD.  ? ESRD (end stage renal disease) on dialysis [facility])   ? a.) T-Th-Sat  ? First degree AV block   ? GERD (gastroesophageal reflux disease)   ? Glaucoma   ? HLD (hyperlipidemia)   ? Hypertension   ? Murmur   ? OA (osteoarthritis)   ? PAD (peripheral artery disease) ([place])   ? Pneumonia   ? Polyneuropathy   ? feet and hands  ? Pulmonary hypertension ([place])   ? RBBB   ? Secondary hyperparathyroidism of renal origin [facility])   ? Sepsis ([place]) 06/28/2017  ? Wears dentures   ? partial upper and lower  ? ? ?Social History  ? ?Socioeconomic History  ? Marital status: Widowed  ?  Spouse name: Not on file  ? Number of children: 5  ? Years of education: Not on file  ? Highest education level: Not on  file  ?Occupational History  ? Not on file  ?Tobacco Use  ? Smoking status: Never  ? Smokeless tobacco: Never  ?Vaping Use  ? Vaping Use: Never used  ?Substance and Sexual Activity  ? Alcohol use: No  ? Drug use: No  ? Sexual activity: Not on file  ?Other Topics Concern  ? Not on file  ?Social History Narrative  ? Lives with son  ? ?Social Determinants of Health  ? ?Financial Resource Strain: Not on file  ?Food Insecurity: Not on file  ?Transportation Needs: Not on file  ?Physical Activity: Not on file  ?Stress: Not on file  ?Social Connections: Not on file  ?Intimate Partner Violence: Not on file  ? ? ?Past Surgical History:  ?Procedure Laterality Date  ? A/V SHUNT INTERVENTION N/A 10/16/2016  ? Procedure: A/V Shunt Intervention;  Surgeon: Katha Cabal, MD;  Location: [place] [facility];  Service: Cardiovascular;  Laterality: N/A;  ? A/V SHUNTOGRAM Left 10/16/2016  ? Procedure: A/V Shuntogram;   Surgeon: Katha Cabal, MD;  Location: [place] [facility];  Service: Cardiovascular;  Laterality: Left;  ? ABDOMINAL HYSTERECTOMY    ? CARPAL TUNNEL RELEASE Left 09/27/2015  ? Procedure: CARPAL TUNNEL RELEASE;  Surgeon: Leanor Kail, MD;  Location: [facility];  Service: Orthopedics;  Laterality: Left;  ? CATARACT EXTRACTION W/PHACO Right 09/23/2016  ? Procedure: CATARACT EXTRACTION PHACO AND INTRAOCULAR LENS PLACEMENT (I[facility]) complicated diabetic right;  Surgeon: Ronnell Freshwater, MD;  Location: [place];  Service: Ophthalmology;  Laterality: Right;  Diabetic - diet controlled ?Potassium draw before surgery  ? CATARACT EXTRACTION W/PHACO Left 01/20/2017  ? Procedure: CATARACT EXTRACTION PHACO AND INTRAOCULAR LENS PLACEMENT ([place]) LEFT DIABETIC;  Surgeon: Eulogio Bear, MD;  Location: [place];  Service: Ophthalmology;  Laterality: Left;  Diabetic - diet controlled ?Potassium draw prior to procedure  8:00 ARRIVAL  ? COLONOSCOPY WITH PROPOFOL N/A 11/28/2014  ? Procedure: COLONOSCOPY WITH PROPOFOL;  Surgeon: Manya Silvas, MD;  Location: [facility];  Service: Endoscopy;  Laterality: N/A;  ? CORONARY ARTERY BYPASS GRAFT N/A 2008  ? Procedure: 3v CORONARY ARTERY BYPASS GRAFT (LIMA-LAD, SVG-OM1, SVG-PDA); Location: [facility]; Surgeon: Jannett Celestine, MD  ? DIALYSIS/PERMA CATHETER INSERTION Left 01/21/2019  ? Procedure: DIALYSIS/PERMA CATHETER EXCHANGE;  Surgeon: Algernon Huxley, MD;  Location: [place] [facility];  Service: Cardiovascular;  Laterality: Left;  ? DIALYSIS/PERMA CATHETER INSERTION N/A 08/06/2021  ? Procedure: DIALYSIS/PERMA CATHETER INSERTION;  Surgeon: Algernon Huxley, MD;  Location: [place] [facility];  Service: Cardiovascular;  Laterality: N/A;  ? DIALYSIS/PERMA CATHETER REMOVAL N/A 03/12/2021  ? Procedure: DIALYSIS/PERMA CATHETER REMOVAL;  Surgeon: Algernon Huxley, MD;  Location: [place] [facility];  Service: Cardiovascular;  Laterality: N/A;  ? HEMIARTHROPLASTY HIP  Left 2018  ? INSERTION OF DIALYSIS CATHETER Left 01/19/2019  ? Procedure: INSERTION OF DIALYSIS CATHETER;  Surgeon: Katha Cabal, MD;  Location: [facility];  Service: Vascular;  Laterality: Left;  ? LEFT HEART CATH AND CORS/GRAFTS ANGIOGRAPHY N/A 02/26/2014  ? Procedure: LEFT HEART CATH AND CORS/GRAFTS ANGIOGRAPHY; Location: [facility]; Surgeon: Trula Slade, MD  ? LIGATIONS OF HERO GRAFT Left 01/19/2019  ? Procedure: EXCISION LEFT HERO GRAFT;  Surgeon: Katha Cabal, MD;  Location: [facility];  Service: Vascular;  Laterality: Left;  ? NEPHRECTOMY Left   ? PERIPHERAL VASCULAR CATHETERIZATION Left 01/18/2015  ? Procedure: A/V Shuntogram/Fistulagram;  Surgeon: Katha Cabal, MD;  Location: [place] [facility];  Service: Cardiovascular;  Laterality: Left;  ?  PERIPHERAL VASCULAR CATHETERIZATION Left 01/18/2015  ? Procedure: A/V Shunt Intervention;  Surgeon: Katha Cabal, MD;  Location: [place] [facility];  Service: Cardiovascular;  Laterality: Left;  ? PERIPHERAL VASCULAR CATHETERIZATION N/A 08/10/2015  ? Procedure: Thrombectomy;  Surgeon: Algernon Huxley, MD;  Location: [place] [facility];  Service: Cardiovascular;  Laterality: N/A;  ? PERIPHERAL VASCULAR CATHETERIZATION N/A 08/10/2015  ? Procedure: A/V Shuntogram/Fistulagram;  Surgeon: Algernon Huxley, MD;  Location: [place] [facility];  Service: Cardiovascular;  Laterality: N/A;  ? PERIPHERAL VASCULAR CATHETERIZATION N/A 08/10/2015  ? Procedure: A/V Shunt Intervention;  Surgeon: Algernon Huxley, MD;  Location: [place] [facility];  Service: Cardiovascular;  Laterality: N/A;  ? PERIPHERAL VASCULAR THROMBECTOMY Left 01/12/2019  ? Procedure: PERIPHERAL VASCULAR THROMBECTOMY;  Surgeon: Katha Cabal, MD;  Location: [place] [facility];  Service: Cardiovascular;  Laterality: Left;  ? REMOVAL OF A DIALYSIS CATHETER Right 01/19/2019  ? Procedure: REMOVAL OF A DIALYSIS CATHETER;  Surgeon: Katha Cabal, MD;  Location: [facility];  Service: Vascular;  Laterality:  Right;  ? RENAL SHUNTS    ? UPPER EXTREMITY ANGIOGRAPHY Right 12/20/2020  ? Procedure: UPPER EXTREMITY ANGIOGRAPHY;  Surgeon: Katha Cabal, MD;  Location: [place] [facility];  Service: Cardiovascular;  Lateral

## 2021-09-03 ENCOUNTER — Encounter (INDEPENDENT_AMBULATORY_CARE_PROVIDER_SITE_OTHER): Payer: Self-pay | Admitting: Nurse Practitioner

## 2021-09-05 ENCOUNTER — Encounter (INDEPENDENT_AMBULATORY_CARE_PROVIDER_SITE_OTHER): Payer: Medicare Other

## 2021-09-05 ENCOUNTER — Ambulatory Visit (INDEPENDENT_AMBULATORY_CARE_PROVIDER_SITE_OTHER): Payer: Medicare Other | Admitting: Nurse Practitioner

## 2021-09-17 ENCOUNTER — Other Ambulatory Visit (INDEPENDENT_AMBULATORY_CARE_PROVIDER_SITE_OTHER): Payer: Self-pay | Admitting: Vascular Surgery

## 2021-09-23 NOTE — Progress Notes (Signed)
Cardiology Office Note    Date:  09/24/2021   ID:  Kelli Nguyen, Kelli Nguyen 1945/08/19, MRN [id number]  PCP:  Crissie Figures, PA-C  Cardiologist:  Nelva Bush, MD  Electrophysiologist:  None   Chief Complaint: Follow up  History of Present Illness:   Kelli Nguyen is a 76 y.o. female with history of CAD status post three-vessel CABG in 2008 with LIMA to LAD, SVG to OM, and SVG to PDA, pulmonary hypertension, ESRD on HD (TTS), DM2, HTN, HLD, neuropathy, anemia of chronic disease, and chronic cough who presents for follow-up of her CAD.   LHC in 2015 was suggestive of severe disease involving the LIMA to LAD, though the graft was apparently not well visualized.  No intervention was undertaken at that time.   She was seen in our office in 08/2020, at the request of vascular surgery for preoperative cardiac risk stratification for HERO graft placement.  She has previously had issues with dialysis access and required excision of infected left arm HERO graft in 01/2019.  She was without symptoms of angina or decompensation at that time.  Given planned vascular surgery and limited functional capacity, she underwent Lexiscan MPI on 09/20/2020 which showed no evidence of ischemia and was overall low risk.  LV systolic function was hyperdynamic estimated at greater than 65%.  Echo at that time demonstrated an EF of 55 to 60%, no regional wall motion abnormalities, grade 2 diastolic dysfunction, low normal RV systolic function with normal ventricular cavity size, moderately elevated PASP estimated at 40.7 mmHg, and no significant valvular abnormalities.  Given the above findings, she was felt to be acceptable risk for noncardiac procedure with HERO graft placement.   She was last seen in the office on 07/25/2021 for preoperative cardiac risk stratification for banding of HERO graft.  She was without symptoms of angina or decompensation.  She underwent successful banding of right arm hero graft on 08/03/2021  followed by tunneled hemodialysis catheter placement of the right femoral vein on 08/06/2021.  ABIs normal bilaterally on 08/22/2021.  She comes in doing very well from a cardiac perspective and is without symptoms of angina or decompensation.  Her lower extremity swelling is stable.  No significant orthopnea or PND.  She is watching her salt intake and drinking less than 2 L of fluid per day.  She does note her blood pressure will drop with HD sessions.  No symptoms concerning for bleeding.  Tolerating cardiac medications without issues.  She does not have any active cardiac issues or concerns at this time.   Labs independently reviewed: 08/2021 - potassium 5.7 07/2021 - BUN 12, SCr 3.48, HGB 11.2, PLT 228 11/2020 - albumin 3.8, AST/ALT normal, A1c 5.2 07/2017 - magnesium 2.3  Past Medical History:  Diagnosis Date   Anemia    Carpal tunnel syndrome of left wrist    Cataract, bilateral    Chronic cough    Closed displaced fracture of left femoral neck ([place]) 03/09/2017   Coronary artery disease    a.) s/p 3v CABG (LIMA-LAD, SVG-OM1, SVG-PDA) in 2008. b.) LHC 02/26/2014: 70% dLM. 80% pLAD, 70-80% mLCx, chronic CTO in the RCA; SVG-OM1 and SVG-PDA patent; 80-90% LIMA-LAD at the anastamosis and 80-90% at dLAD after anastamosis; very tortous LEFT subclavian aftery LIMA takeoff (difficult engagement of LIMA via femoral approach).   Diabetes mellitus without complication (HCC)    Diastolic dysfunction    a.) TTE 09/20/2020: EF 55-60%; low normal RVSF; RVSP elevated at 40.7 mmHg; mild  TR; G2DD.   ESRD (end stage renal disease) on dialysis ([place])    a.) T-Th-Sat   First degree AV block    GERD (gastroesophageal reflux disease)    Glaucoma    HLD (hyperlipidemia)    Hypertension    Murmur    OA (osteoarthritis)    PAD (peripheral artery disease) (HCC)    Pneumonia    Polyneuropathy    feet and hands   Pulmonary hypertension (HCC)    RBBB    Secondary hyperparathyroidism of renal origin ([place])     Sepsis ([place]) 06/28/2017   Wears dentures    partial upper and lower    Past Surgical History:  Procedure Laterality Date   A/V SHUNT INTERVENTION N/A 10/16/2016   Procedure: A/V Shunt Intervention;  Surgeon: Katha Cabal, MD;  Location: [facility];  Service: Cardiovascular;  Laterality: N/A;   A/V SHUNTOGRAM Left 10/16/2016   Procedure: A/V Shuntogram;  Surgeon: Katha Cabal, MD;  Location: [facility];  Service: Cardiovascular;  Laterality: Left;   ABDOMINAL HYSTERECTOMY     CARPAL TUNNEL RELEASE Left 09/27/2015   Procedure: CARPAL TUNNEL RELEASE;  Surgeon: Leanor Kail, MD;  Location: [facility];  Service: Orthopedics;  Laterality: Left;   CATARACT EXTRACTION W/PHACO Right 09/23/2016   Procedure: CATARACT EXTRACTION PHACO AND INTRAOCULAR LENS PLACEMENT ([facility]) complicated diabetic right;  Surgeon: Ronnell Freshwater, MD;  Location: [facility];  Service: Ophthalmology;  Laterality: Right;  Diabetic - diet controlled Potassium draw before surgery   CATARACT EXTRACTION W/PHACO Left 01/20/2017   Procedure: CATARACT EXTRACTION PHACO AND INTRAOCULAR LENS PLACEMENT ([place]) LEFT DIABETIC;  Surgeon: Eulogio Bear, MD;  Location: [facility];  Service: Ophthalmology;  Laterality: Left;  Diabetic - diet controlled Potassium draw prior to procedure  8:00 ARRIVAL   COLONOSCOPY WITH PROPOFOL N/A 11/28/2014   Procedure: COLONOSCOPY WITH PROPOFOL;  Surgeon: Manya Silvas, MD;  [facility];  Service: Endoscopy;  Laterality: N/A;   CORONARY ARTERY BYPASS GRAFT N/A 2008   Procedure: 3v CORONARY ARTERY BYPASS GRAFT (LIMA-LAD, SVG-OM1, SVG-PDA); Location: [facility]; Surgeon: Jannett Celestine, MD   DIALYSIS/PERMA CATHETER INSERTION Left 01/21/2019   Procedure: DIALYSIS/PERMA CATHETER EXCHANGE;  Surgeon: Algernon Huxley, MD;  Location: [facility];  Service: Cardiovascular;  Laterality: Left;   DIALYSIS/PERMA CATHETER INSERTION N/A 08/06/2021    Procedure: DIALYSIS/PERMA CATHETER INSERTION;  Surgeon: Algernon Huxley, MD;  Location: [facility];  Service: Cardiovascular;  Laterality: N/A;   DIALYSIS/PERMA CATHETER REMOVAL N/A 03/12/2021   Procedure: DIALYSIS/PERMA CATHETER REMOVAL;  Surgeon: Algernon Huxley, MD;  Location: [facility];  Service: Cardiovascular;  Laterality: N/A;   HEMIARTHROPLASTY HIP Left 2018   INSERTION OF DIALYSIS CATHETER Left 01/19/2019   Procedure: INSERTION OF DIALYSIS CATHETER;  Surgeon: Katha Cabal, MD;  Location: [facility];  Service: Vascular;  Laterality: Left;   LEFT HEART CATH AND CORS/GRAFTS ANGIOGRAPHY N/A 02/26/2014   Procedure: LEFT HEART CATH AND CORS/GRAFTS ANGIOGRAPHY; Location: [facility]; Surgeon: Trula Slade, MD   LIGATIONS OF HERO GRAFT Left 01/19/2019   Procedure: EXCISION LEFT HERO GRAFT;  Surgeon: Katha Cabal, MD;  Location: [facility];  Service: Vascular;  Laterality: Left;   NEPHRECTOMY Left    PERIPHERAL VASCULAR CATHETERIZATION Left 01/18/2015   Procedure: A/V Shuntogram/Fistulagram;  Surgeon: Katha Cabal, MD;  Location: [facility];  Service: Cardiovascular;  Laterality: Left;   PERIPHERAL VASCULAR CATHETERIZATION Left 01/18/2015   Procedure: A/V Shunt Intervention;  Surgeon: Katha Cabal, MD;  Location: [facility];  Service: Cardiovascular;  Laterality: Left;   PERIPHERAL VASCULAR CATHETERIZATION N/A 08/10/2015   Procedure: Thrombectomy;  Surgeon: Algernon Huxley, MD;  Location: [facility];  Service: Cardiovascular;  Laterality: N/A;   PERIPHERAL VASCULAR CATHETERIZATION N/A 08/10/2015   Procedure: A/V Shuntogram/Fistulagram;  Surgeon: Algernon Huxley, MD;  Location: [facility];  Service: Cardiovascular;  Laterality: N/A;   PERIPHERAL VASCULAR CATHETERIZATION N/A 08/10/2015   Procedure: A/V Shunt Intervention;  Surgeon: Algernon Huxley, MD;  Location: [facility];  Service: Cardiovascular;  Laterality: N/A;   PERIPHERAL  VASCULAR THROMBECTOMY Left 01/12/2019   Procedure: PERIPHERAL VASCULAR THROMBECTOMY;  Surgeon: Katha Cabal, MD;  Location: [facility];  Service: Cardiovascular;  Laterality: Left;   REMOVAL OF A DIALYSIS CATHETER Right 01/19/2019   Procedure: REMOVAL OF A DIALYSIS CATHETER;  Surgeon: Katha Cabal, MD;  Location: [facility];  Service: Vascular;  Laterality: Right;   RENAL SHUNTS     UPPER EXTREMITY ANGIOGRAPHY Right 12/20/2020   Procedure: UPPER EXTREMITY ANGIOGRAPHY;  Surgeon: Katha Cabal, MD;  Location: [facility];  Service: Cardiovascular;  Laterality: Right;   UPPER EXTREMITY ANGIOGRAPHY Right 06/22/2021   Procedure: Upper Extremity Angiography;  Surgeon: Katha Cabal, MD;  Location: [facility];  Service: Cardiovascular;  Laterality: Right;   UPPER EXTREMITY VENOGRAPHY Right 03/20/2020   Procedure: UPPER EXTREMITY VENOGRAPHY;  Surgeon: Algernon Huxley, MD;  Location: [facility];  Service: Cardiovascular;  Laterality: Right;   VASCULAR ACCESS DEVICE INSERTION Right 11/24/2020   Procedure: INSERTION OF HERO VASCULAR ACCESS DEVICE (GRAFT);  Surgeon: Katha Cabal, MD;  Location: [facility];  Service: Vascular;  Laterality: Right;    Current Medications: No outpatient medications have been marked as taking for the 09/24/21 encounter (Office Visit) with Rise Mu, PA-C.    Allergies:   Gadolinium derivatives, Penicillins, Codeine, Codeine sulfate, Contrast media [iodinated contrast media], Gadobutrol, Metrizamide, and Statins   Social History   Socioeconomic History   Marital status: Widowed    Spouse name: Not on file   Number of children: 5   Years of education: Not on file   Highest education level: Not on file  Occupational History   Not on file  Tobacco Use   Smoking status: Never   Smokeless tobacco: Never  Vaping Use   Vaping Use: Never used  Substance and Sexual Activity   Alcohol use: No   Drug use: No    Sexual activity: Not on file  Other Topics Concern   Not on file  Social History Narrative   Lives with son   Social Determinants of Health   Financial Resource Strain: Not on file  Food Insecurity: Not on file  Transportation Needs: Not on file  Physical Activity: Not on file  Stress: Not on file  Social Connections: Not on file     Family History:  The patient's family history includes Hypertension in her sister.  ROS:   12-point review of systems is negative unless otherwise noted in the HPI.   EKGs/Labs/Other Studies Reviewed:    Studies reviewed were summarized above. The additional studies were reviewed today:  Lexiscan MPI 09/20/2020: No T wave inversion was noted during stress. There was no ST segment deviation noted during stress. The study is normal. This is a low risk study. The left ventricular ejection fraction is hyperdynamic (>65%). There is no  evidence for ischemia __________   2D echo 09/20/2020: 1. Left ventricular ejection fraction, by estimation, is 55 to 60%. The  left ventricle has normal function. The left ventricle has no regional  wall motion abnormalities. Left ventricular diastolic parameters are  consistent with Grade II diastolic  dysfunction (pseudonormalization).   2. Right ventricular systolic function is low normal. The right  ventricular size is normal. There is moderately elevated pulmonary artery  systolic pressure. The estimated right ventricular systolic pressure is  04.5 mmHg.   3. The mitral valve was not well visualized. No evidence of mitral valve  regurgitation.   4. The aortic valve was not well visualized. Aortic valve regurgitation  is not visualized.   5. The inferior vena cava is normal in size with greater than 50%  respiratory variability, suggesting right atrial pressure of 3 mmHg. __________   2D echo 07/02/2017 [facility]): EF greater than 55%, mild mitral regurgitation, aortic sclerosis, mild to moderate tricuspid  regurgitation, normal RV systolic function, moderate pulmonary hypertension. __________   LHC 02/26/2014 [facility]): LMCA with 70% distal stenosis extending into ostial LAD and LCx.  LAD has 80% ostial/proximal stenosis and diffuse 90 to 95% disease in the mid vessel.  Distal LAD fills via LIMA graft.  LCx demonstrates chronic total occlusion of OM2, which is supplied by SVG.  LCx has 60% ostial disease and 70 to 80% mid vessel stenosis.  Distal LCx is subtotally occluded with left to left collaterals.  Dominant RCA is chronically occluded in its midsection and Illes distally via patent SVG to PDA.  SVG to OM and SVG to RPDA are widely patent.  LIMA to LAD is not well visualized but appears to have at least 80 to 90% stenosis at the distal anastomosis extending into the native LAD.   EKG:  EKG is not ordered today.    Recent Labs: 11/25/2020: ALT 10 07/26/2021: Platelets 228 08/03/2021: BUN 41; Creatinine, Ser 6.70; Hemoglobin 12.9; Potassium 5.2; Sodium 137  Recent Lipid Panel No results found for: CHOL, TRIG, HDL, CHOLHDL, VLDL, LDLCALC, LDLDIRECT  PHYSICAL EXAM:    VS:  BP (!) 172/74 (BP Location: Left Arm, Patient Position: Sitting, Cuff Size: Normal)   Pulse 67   Ht '4\' 11"'$  (1.499 m)   Wt 158 lb (71.7 kg)   SpO2 98%   BMI 31.91 kg/m   BMI: Body mass index is 31.91 kg/m.  Physical Exam Vitals reviewed.  Constitutional:      Appearance: She is well-developed.  HENT:     Head: Normocephalic and atraumatic.  Eyes:     General:        Right eye: No discharge.        Left eye: No discharge.  Neck:     Vascular: No JVD.  Cardiovascular:     Rate and Rhythm: Normal rate and regular rhythm.     Heart sounds: S1 normal and S2 normal. Heart sounds not distant. No midsystolic click and no opening snap. Murmur heard.  Systolic murmur is present with a grade of 1/6 at the upper right sternal border.    No friction rub.  Pulmonary:     Effort: Pulmonary effort is normal. No respiratory  distress.     Breath sounds: Normal breath sounds. No decreased breath sounds, wheezing or rales.  Chest:     Chest wall: No tenderness.  Abdominal:     General: There is no distension.     Palpations: Abdomen is soft.     Tenderness:  There is no abdominal tenderness.  Musculoskeletal:     Cervical back: Normal range of motion.     Comments: Trivial bilateral pretibial edema.  Skin:    General: Skin is warm and dry.     Nails: There is no clubbing.  Neurological:     Mental Status: She is alert and oriented to person, place, and time.  Psychiatric:        Speech: Speech normal.        Behavior: Behavior normal.        Thought Content: Thought content normal.        Judgment: Judgment normal.    Wt Readings from Last 3 Encounters:  09/24/21 158 lb (71.7 kg)  08/22/21 154 lb 12.8 oz (70.2 kg)  08/06/21 163 lb (73.9 kg)     ASSESSMENT & PLAN:   CAD status post CABG without angina: She continues to do well without symptoms concerning for angina.  Lexiscan MPI in 09/2020 was without evidence of ischemia and overall low risk.  She remains on aspirin and clopidogrel (per vascular surgery given vascular disease) along with pravastatin.  She is intolerant to high intensity statins.  No indication for further ischemic testing at this time.  Pulmonary hypertension: Stable.  Volume management per hemodialysis.  HLD: No recent lipid panel on file for review.  She declines labs today.  We will plan to update a fasting lipid panel in follow-up.  She remains on pravastatin.  Intolerant to high intensity statins.  ESRD on HD: ESRD felt to be secondary to hypertension and diabetes.  Dialysis per nephrology.  Access per vascular surgery.  DM2 with vascular disease: Ongoing management per PCP and vascular surgery.  ABI's normal bilaterally in 08/2021.      Disposition: F/u with Dr. Saunders Revel or an APP in 6 months.   Medication Adjustments/Labs and Tests Ordered: Current medicines are reviewed at  length with the patient today.  Concerns regarding medicines are outlined above. Medication changes, Labs and Tests ordered today are summarized above and listed in the Patient Instructions accessible in Encounters.   Signed, Christell Faith, PA-C 09/24/2021 9:47 AM     [facility] [address] [place] [address] [place], [place] [address] [phone]

## 2021-09-24 ENCOUNTER — Ambulatory Visit (INDEPENDENT_AMBULATORY_CARE_PROVIDER_SITE_OTHER): Payer: Medicare Other | Admitting: Physician Assistant

## 2021-09-24 ENCOUNTER — Encounter: Payer: Self-pay | Admitting: Physician Assistant

## 2021-09-24 VITALS — BP 172/74 | HR 67 | Ht 59.0 in | Wt 158.0 lb

## 2021-09-24 DIAGNOSIS — Z951 Presence of aortocoronary bypass graft: Secondary | ICD-10-CM

## 2021-09-24 DIAGNOSIS — E1151 Type 2 diabetes mellitus with diabetic peripheral angiopathy without gangrene: Secondary | ICD-10-CM

## 2021-09-24 DIAGNOSIS — E785 Hyperlipidemia, unspecified: Secondary | ICD-10-CM | POA: Diagnosis not present

## 2021-09-24 DIAGNOSIS — N186 End stage renal disease: Secondary | ICD-10-CM

## 2021-09-24 DIAGNOSIS — I2510 Atherosclerotic heart disease of native coronary artery without angina pectoris: Secondary | ICD-10-CM

## 2021-09-24 DIAGNOSIS — I272 Pulmonary hypertension, unspecified: Secondary | ICD-10-CM | POA: Diagnosis not present

## 2021-09-24 NOTE — Patient Instructions (Signed)
Medication Instructions:  No changes at this time.   *If you need a refill on your cardiac medications before your next appointment, please call your pharmacy*   Lab Work: None  If you have labs (blood work) drawn today and your tests are completely normal, you will receive your results only by: [place] (if you have MyChart) OR A paper copy in the mail If you have any lab test that is abnormal or we need to change your treatment, we will call you to review the results.   Testing/Procedures: None   Follow-Up: At [facility], you and your health needs are our priority.  As part of our continuing mission to provide you with exceptional heart care, we have created designated Provider Care Teams.  These Care Teams include your primary Cardiologist (physician) and Advanced Practice Providers (APPs -  Physician Assistants and Nurse Practitioners) who all work together to provide you with the care you need, when you need it.  We recommend signing up for the patient portal called "MyChart".  Sign up information is provided on this After Visit Summary.  MyChart is used to connect with patients for Virtual Visits (Telemedicine).  Patients are able to view lab/test results, encounter notes, upcoming appointments, etc.  Non-urgent messages can be sent to your provider as well.   To learn more about what you can do with MyChart, go to [email or web address].    Your next appointment:   6 month(s)  The format for your next appointment:   In Person  Provider:   Nelva Bush, MD or Christell Faith, PA-C       Important Information About Sugar

## 2021-09-27 ENCOUNTER — Other Ambulatory Visit: Payer: Self-pay

## 2021-09-27 ENCOUNTER — Encounter
Admission: RE | Disposition: A | Discharge status: Home / Self Care | Payer: Self-pay | Source: Home / Self Care | Attending: Vascular Surgery

## 2021-09-27 ENCOUNTER — Encounter: Payer: Self-pay | Admitting: Vascular Surgery

## 2021-09-27 ENCOUNTER — Telehealth (INDEPENDENT_AMBULATORY_CARE_PROVIDER_SITE_OTHER): Payer: Self-pay

## 2021-09-27 ENCOUNTER — Ambulatory Visit
Admission: RE | Admit: 2021-09-27 | Discharge: 2021-09-27 | Disposition: A | Discharge status: Home / Self Care | Payer: Medicare Other | Attending: Vascular Surgery | Admitting: Vascular Surgery

## 2021-09-27 DIAGNOSIS — E1122 Type 2 diabetes mellitus with diabetic chronic kidney disease: Secondary | ICD-10-CM | POA: Diagnosis not present

## 2021-09-27 DIAGNOSIS — N186 End stage renal disease: Secondary | ICD-10-CM

## 2021-09-27 DIAGNOSIS — T8249XA Other complication of vascular dialysis catheter, initial encounter: Secondary | ICD-10-CM | POA: Insufficient documentation

## 2021-09-27 DIAGNOSIS — Z992 Dependence on renal dialysis: Secondary | ICD-10-CM | POA: Diagnosis not present

## 2021-09-27 DIAGNOSIS — Y841 Kidney dialysis as the cause of abnormal reaction of the patient, or of later complication, without mention of misadventure at the time of the procedure: Secondary | ICD-10-CM | POA: Insufficient documentation

## 2021-09-27 DIAGNOSIS — I2510 Atherosclerotic heart disease of native coronary artery without angina pectoris: Secondary | ICD-10-CM | POA: Insufficient documentation

## 2021-09-27 DIAGNOSIS — I120 Hypertensive chronic kidney disease with stage 5 chronic kidney disease or end stage renal disease: Secondary | ICD-10-CM | POA: Insufficient documentation

## 2021-09-27 HISTORY — PX: DIALYSIS/PERMA CATHETER INSERTION: CATH118288

## 2021-09-27 LAB — POTASSIUM (ARMC VASCULAR LAB ONLY): Potassium (ARMC vascular lab): 3.8 mmol/L (ref 3.5–5.1)

## 2021-09-27 SURGERY — DIALYSIS/PERMA CATHETER INSERTION
Anesthesia: Moderate Sedation

## 2021-09-27 MED ORDER — METHYLPREDNISOLONE SODIUM SUCC 125 MG IJ SOLR
125.0000 mg | Freq: Once | INTRAMUSCULAR | Status: AC | PRN
  Administered 2021-09-27: 125 mg via INTRAVENOUS

## 2021-09-27 MED ORDER — MIDAZOLAM HCL 2 MG/2ML IJ SOLN
INTRAMUSCULAR | Status: AC
  Filled 2021-09-27: qty 2

## 2021-09-27 MED ORDER — FENTANYL CITRATE (PF) 100 MCG/2ML IJ SOLN
INTRAMUSCULAR | Status: DC | PRN
Start: 2021-09-27 — End: 2021-09-27
  Administered 2021-09-27: 50 ug via INTRAVENOUS

## 2021-09-27 MED ORDER — FENTANYL CITRATE PF 50 MCG/ML IJ SOSY
PREFILLED_SYRINGE | INTRAMUSCULAR | Status: AC
  Filled 2021-09-27: qty 1

## 2021-09-27 MED ORDER — ONDANSETRON HCL 4 MG/2ML IJ SOLN: 4.0000 mg | Freq: Four times a day (QID) | INTRAMUSCULAR | Status: DC | PRN

## 2021-09-27 MED ORDER — MIDAZOLAM HCL 2 MG/ML PO SYRP
8.0000 mg | ORAL_SOLUTION | Freq: Once | ORAL | Status: DC | PRN
Start: 2021-09-27 — End: 2021-09-27

## 2021-09-27 MED ORDER — FAMOTIDINE 20 MG PO TABS
40.0000 mg | ORAL_TABLET | Freq: Once | ORAL | Status: AC | PRN
  Administered 2021-09-27: 40 mg via ORAL

## 2021-09-27 MED ORDER — MIDAZOLAM HCL 2 MG/2ML IJ SOLN
INTRAMUSCULAR | Status: DC | PRN
  Administered 2021-09-27: 1 mg via INTRAVENOUS

## 2021-09-27 MED ORDER — DIPHENHYDRAMINE HCL 50 MG/ML IJ SOLN
INTRAMUSCULAR | Status: AC
  Administered 2021-09-27: 50 mg via INTRAVENOUS
  Filled 2021-09-27: qty 1

## 2021-09-27 MED ORDER — FAMOTIDINE 20 MG PO TABS
ORAL_TABLET | ORAL | Status: AC
  Filled 2021-09-27: qty 1

## 2021-09-27 MED ORDER — DIPHENHYDRAMINE HCL 50 MG/ML IJ SOLN: 50.0000 mg | Freq: Once | INTRAMUSCULAR | Status: AC | PRN

## 2021-09-27 MED ORDER — SODIUM CHLORIDE 0.9 % IV SOLN: INTRAVENOUS | Status: DC

## 2021-09-27 MED ORDER — VANCOMYCIN HCL IN DEXTROSE 1-5 GM/200ML-% IV SOLN
1000.0000 mg | INTRAVENOUS | Status: AC
  Administered 2021-09-27: 1000 mg via INTRAVENOUS
  Filled 2021-09-27: qty 200

## 2021-09-27 MED ORDER — METHYLPREDNISOLONE SODIUM SUCC 125 MG IJ SOLR
INTRAMUSCULAR | Status: AC
  Filled 2021-09-27: qty 2

## 2021-09-27 MED ORDER — HYDROMORPHONE HCL 1 MG/ML IJ SOLN: 1.0000 mg | Freq: Once | INTRAMUSCULAR | Status: DC | PRN

## 2021-09-27 SURGICAL SUPPLY — 7 items
BIOPATCH RED 1 DISK 7.0 (GAUZE/BANDAGES/DRESSINGS) ×1 IMPLANT
CATH PALINDROME-P 44CM KIT (CATHETERS) ×1
GUIDEWIRE SUPER STIFF .035X180 (WIRE) ×1 IMPLANT
KIT CATH CHRNC PALINDROME PRCS (CATHETERS) IMPLANT
PACK ANGIOGRAPHY (CUSTOM PROCEDURE TRAY) ×1 IMPLANT
SUT MNCRL AB 4-0 PS2 18 (SUTURE) ×1 IMPLANT
SUT PROLENE 0 CT 1 30 (SUTURE) ×1 IMPLANT

## 2021-09-27 NOTE — H&P (Signed)
[facility] Admission History & Physical  MRN : [id number]  Kelli Nguyen is a 76 y.o. (1946/01/05) female who presents with chief complaint of No chief complaint on file. [name]  History of Present Illness:  I am asked to evaluate the patient by the dialysis center. The patient was sent here because they were unable to achieve adequate dialysis yesterday. Furthermore the Center states they were unable to aspirate either lumen of the catheter yesterday.  This problem has been getting worse for about 1 week. The patient is unaware of any other change.   Patient denies pain or tenderness overlying the access.  There is no pain with dialysis.  Patient denies fevers or shaking chills while on dialysis.    There have multiple any past interventions and declots of his multiple different access.  The patient is not chronically hypotensive on dialysis.   No current facility-administered medications for this encounter.     Past Surgical History:  Procedure Laterality Date   A/V SHUNT INTERVENTION N/A 10/16/2016   Procedure: A/V Shunt Intervention;  Surgeon: Katha Cabal, MD;  Location: [place] [facility];  Service: Cardiovascular;  Laterality: N/A;   A/V SHUNTOGRAM Left 10/16/2016   Procedure: A/V Shuntogram;  Surgeon: Katha Cabal, MD;  Location: [place] [facility];  Service: Cardiovascular;  Laterality: Left;   ABDOMINAL HYSTERECTOMY     CARPAL TUNNEL RELEASE Left 09/27/2015   Procedure: CARPAL TUNNEL RELEASE;  Surgeon: Leanor Kail, MD;  Location: [facility];  Service: Orthopedics;  Laterality: Left;   CATARACT EXTRACTION W/PHACO Right 09/23/2016   Procedure: CATARACT EXTRACTION PHACO AND INTRAOCULAR LENS PLACEMENT (IOC) complicated diabetic right;  Surgeon: Ronnell Freshwater, MD;  Location: [facility];  Service: Ophthalmology;  Laterality: Right;  Diabetic - diet controlled Potassium draw before surgery   CATARACT EXTRACTION W/PHACO Left  01/20/2017   Procedure: CATARACT EXTRACTION PHACO AND INTRAOCULAR LENS PLACEMENT ([place]) LEFT DIABETIC;  Surgeon: Eulogio Bear, MD;  Location: [place];  Service: Ophthalmology;  Laterality: Left;  Diabetic - diet controlled Potassium draw prior to procedure  8:00 ARRIVAL   COLONOSCOPY WITH PROPOFOL N/A 11/28/2014   Procedure: COLONOSCOPY WITH PROPOFOL;  Surgeon: Manya Silvas, MD;  Location: [facility];  Service: Endoscopy;  Laterality: N/A;   CORONARY ARTERY BYPASS GRAFT N/A 2008   Procedure: 3v CORONARY ARTERY BYPASS GRAFT (LIMA-LAD, SVG-OM1, SVG-PDA); Location: [facility]; Surgeon: Jannett Celestine, MD   DIALYSIS/PERMA CATHETER INSERTION Left 01/21/2019   Procedure: DIALYSIS/PERMA CATHETER EXCHANGE;  Surgeon: Algernon Huxley, MD;  Location: [place] [facility];  Service: Cardiovascular;  Laterality: Left;   DIALYSIS/PERMA CATHETER INSERTION N/A 08/06/2021   Procedure: DIALYSIS/PERMA CATHETER INSERTION;  Surgeon: Algernon Huxley, MD;  Location: [place] [facility];  Service: Cardiovascular;  Laterality: N/A;   DIALYSIS/PERMA CATHETER REMOVAL N/A 03/12/2021   Procedure: DIALYSIS/PERMA CATHETER REMOVAL;  Surgeon: Algernon Huxley, MD;  Location: [place] [facility];  Service: Cardiovascular;  Laterality: N/A;   HEMIARTHROPLASTY HIP Left 2018   INSERTION OF DIALYSIS CATHETER Left 01/19/2019   Procedure: INSERTION OF DIALYSIS CATHETER;  Surgeon: Katha Cabal, MD;  Location: [facility];  Service: Vascular;  Laterality: Left;   LEFT HEART CATH AND CORS/GRAFTS ANGIOGRAPHY N/A 02/26/2014   Procedure: LEFT HEART CATH AND CORS/GRAFTS ANGIOGRAPHY; Location: [facility]; Surgeon: Trula Slade, MD   LIGATIONS OF HERO GRAFT Left 01/19/2019   Procedure: EXCISION LEFT HERO GRAFT;  Surgeon: Katha Cabal, MD;  Location: [facility];  Service:  Vascular;  Laterality: Left;   NEPHRECTOMY Left    PERIPHERAL VASCULAR CATHETERIZATION Left 01/18/2015   Procedure: A/V Shuntogram/Fistulagram;  Surgeon: Katha Cabal, MD;  Location: [place] [facility];  Service: Cardiovascular;  Laterality: Left;   PERIPHERAL VASCULAR CATHETERIZATION Left 01/18/2015   Procedure: A/V Shunt Intervention;  Surgeon: Katha Cabal, MD;  Location: [place] [facility];  Service: Cardiovascular;  Laterality: Left;   PERIPHERAL VASCULAR CATHETERIZATION N/A 08/10/2015   Procedure: Thrombectomy;  Surgeon: Algernon Huxley, MD;  Location: [place] [facility];  Service: Cardiovascular;  Laterality: N/A;   PERIPHERAL VASCULAR CATHETERIZATION N/A 08/10/2015   Procedure: A/V Shuntogram/Fistulagram;  Surgeon: Algernon Huxley, MD;  Location: [place] [facility];  Service: Cardiovascular;  Laterality: N/A;   PERIPHERAL VASCULAR CATHETERIZATION N/A 08/10/2015   Procedure: A/V Shunt Intervention;  Surgeon: Algernon Huxley, MD;  Location: [place] [facility];  Service: Cardiovascular;  Laterality: N/A;   PERIPHERAL VASCULAR THROMBECTOMY Left 01/12/2019   Procedure: PERIPHERAL VASCULAR THROMBECTOMY;  Surgeon: Katha Cabal, MD;  Location: [place] [facility];  Service: Cardiovascular;  Laterality: Left;   REMOVAL OF A DIALYSIS CATHETER Right 01/19/2019   Procedure: REMOVAL OF A DIALYSIS CATHETER;  Surgeon: Katha Cabal, MD;  Location: [facility];  Service: Vascular;  Laterality: Right;   RENAL SHUNTS     UPPER EXTREMITY ANGIOGRAPHY Right 12/20/2020   Procedure: UPPER EXTREMITY ANGIOGRAPHY;  Surgeon: Katha Cabal, MD;  Location: [place] [facility];  Service: Cardiovascular;  Laterality: Right;   UPPER EXTREMITY ANGIOGRAPHY Right 06/22/2021   Procedure: Upper Extremity Angiography;  Surgeon: Katha Cabal, MD;  Location: [place] [facility];  Service: Cardiovascular;  Laterality: Right;   UPPER EXTREMITY VENOGRAPHY Right 03/20/2020   Procedure: UPPER EXTREMITY VENOGRAPHY;  Surgeon: Algernon Huxley, MD;  Location: [place] [facility];  Service: Cardiovascular;  Laterality: Right;   VASCULAR ACCESS DEVICE INSERTION Right  11/24/2020   Procedure: INSERTION OF HERO VASCULAR ACCESS DEVICE (GRAFT);  Surgeon: Katha Cabal, MD;  Location: [facility];  Service: Vascular;  Laterality: Right;     Social History   Tobacco Use   Smoking status: Never   Smokeless tobacco: Never  Vaping Use   Vaping Use: Never used  Substance Use Topics   Alcohol use: No   Drug use: No     Family History  Problem Relation Age of Onset   Hypertension Sister     No family history of bleeding or clotting disorders, autoimmune disease or porphyria  Allergies  Allergen Reactions   Gadolinium Derivatives Hives    Other reaction(s): Unknown Other reaction(s): Other (See Comments) Other reaction(s): Unknown   Penicillins Hives, Itching and Swelling    Has patient had a PCN reaction causing immediate rash, facial/tongue/throat swelling, SOB or lightheadedness with hypotension: No Has patient had a PCN reaction causing severe rash involving mucus membranes or skin necrosis: No Has patient had a PCN reaction that required hospitalization: No Has patient had a PCN reaction occurring within the last 10 years: No If all of the above answers are "NO", then may proceed with Cephalosporin use.  Penicillin allergy assessment completed on 03/22/20. Patient has received and tolerated cepholosporins incuding cefepime, ceftriaxone.    Codeine Hives   Codeine Sulfate Nausea Only   Contrast Media [Iodinated Contrast Media] Hives   Gadobutrol Hives   Metrizamide Hives   Statins Other (See Comments)    muscle pain     REVIEW OF SYSTEMS (Negative  unless checked)  Constitutional: '[]'$ Weight loss  '[]'$ Fever  '[]'$ Chills Cardiac: '[]'$ Chest pain   '[]'$ Chest pressure   '[]'$ Palpitations   '[]'$ Shortness of breath when laying flat   '[]'$ Shortness of breath at rest   '[x]'$ Shortness of breath with exertion. Vascular:  '[]'$ Pain in legs with walking   '[]'$ Pain in legs at rest   '[]'$ Pain in legs when laying flat   '[]'$ Claudication   '[]'$ Pain in feet when walking  '[]'$ Pain in  feet at rest  '[]'$ Pain in feet when laying flat   '[]'$ History of DVT   '[]'$ Phlebitis   '[]'$ Swelling in legs   '[]'$ Varicose veins   '[]'$ Non-healing ulcers Pulmonary:   '[]'$ Uses home oxygen   '[]'$ Productive cough   '[]'$ Hemoptysis   '[]'$ Wheeze  '[]'$ COPD   '[]'$ Asthma Neurologic:  '[]'$ Dizziness  '[]'$ Blackouts   '[]'$ Seizures   '[]'$ History of stroke   '[]'$ History of TIA  '[]'$ Aphasia   '[]'$ Temporary blindness   '[]'$ Dysphagia   '[]'$ Weakness or numbness in arms   '[]'$ Weakness or numbness in legs Musculoskeletal:  '[x]'$ Arthritis   '[]'$ Joint swelling   '[]'$ Joint pain   '[]'$ Low back pain Hematologic:  '[]'$ Easy bruising  '[]'$ Easy bleeding   '[]'$ Hypercoagulable state   '[x]'$ Anemic  '[]'$ Hepatitis Gastrointestinal:  '[]'$ Blood in stool   '[]'$ Vomiting blood  '[]'$ Gastroesophageal reflux/heartburn   '[]'$ Difficulty swallowing. Genitourinary:  '[x]'$ Chronic kidney disease   '[]'$ Difficult urination  '[]'$ Frequent urination  '[]'$ Burning with urination   '[]'$ Blood in urine Skin:  '[]'$ Rashes   '[]'$ Ulcers   '[]'$ Wounds Psychological:  '[]'$ History of anxiety   '[]'$  History of major depression.  Physical Examination  There were no vitals filed for this visit. There is no height or weight on file to calculate BMI. Gen: WD/WN, NAD Head: [place]/AT, No temporalis wasting.  Ear/Nose/Throat: Hearing grossly intact, nares w/o erythema or drainage, oropharynx w/o Erythema/Exudate,  Eyes: Conjunctiva clear, sclera non-icteric Neck: Trachea midline.  No JVD.  Pulmonary:  Good air movement, respirations not labored, no use of accessory muscles.  Cardiac: RRR, normal S1, S2. Vascular: right tunneled catheter without tenderness or drainage Vessel Right Left  Radial Palpable Palpable  Gastrointestinal: soft, non-tender/non-distended. No guarding/reflex.  Musculoskeletal: M/S 5/5 throughout.  Extremities without ischemic changes.  No deformity or atrophy.  Neurologic: Sensation grossly intact in extremities.  Symmetrical.  Speech is fluent. Motor exam as listed above. Psychiatric: Judgment intact, Mood & affect appropriate for  pt's clinical situation. Dermatologic: No rashes or ulcers noted.  No cellulitis or open wounds.    CBC Lab Results  Component Value Date   WBC 5.7 07/26/2021   HGB 12.9 08/03/2021   HCT 38.0 08/03/2021   MCV 88.2 07/26/2021   PLT 228 07/26/2021    BMET    Component Value Date/Time   NA 137 08/03/2021 0845   NA 135 (L) 03/29/2013 1409   K 5.2 (H) 08/03/2021 0845   K 4.5 03/29/2013 1409   CL 98 08/03/2021 0845   CL 97 (L) 03/29/2013 1409   CO2 31 07/26/2021 1108   CO2 27 03/29/2013 1409   GLUCOSE 98 08/03/2021 0845   GLUCOSE 97 03/29/2013 1409   BUN 41 (H) 08/03/2021 0845   BUN 48 (H) 03/29/2013 1409   CREATININE 6.70 (H) 08/03/2021 0845   CREATININE 12.06 (H) 03/29/2013 1409   CALCIUM 8.4 (L) 07/26/2021 1108   CALCIUM 8.8 03/29/2013 1409   GFRNONAA 13 (L) 07/26/2021 1108   GFRNONAA 3 (L) 03/29/2013 1409   GFRAA 3 (L) 01/21/2019 0504   GFRAA 3 (L) 03/29/2013 1409   CrCl cannot be calculated (Patient's most  recent lab result is older than the maximum 21 days allowed.).  COAG Lab Results  Component Value Date   INR 1.1 11/30/2020   INR 1.1 11/25/2020   INR 1.1 11/17/2020    Radiology No results found.  Assessment/Plan 1.  Complication dialysis device with non-functional AV access:  Patient's right femoral tunneled catheter is thrombosed. The patient will undergo exchange of the catheter same venous access using interventional techniques.  The risks and benefits were described to the patient.  All questions were answered.  The patient agrees to proceed with intervention.  2.  End-stage renal disease requiring hemodialysis:  Patient will continue dialysis therapy without further interruption if a successful exchange is not achieved then new site will be found for tunneled catheter placement. Dialysis has already been arranged since the patient missed their previous session 3.  Hypertension:  Patient will continue medical management; nephrology is following no changes  in oral medications. 4. Diabetes mellitus:  Glucose will be monitored and oral medications been held this morning once the patient has undergone the patient's procedure po intake will be reinitiated and again Accu-Cheks will be used to assess the blood glucose level and treat as needed. The patient will be restarted on the patient's usual hypoglycemic regime 5.  Coronary artery disease:  EKG will be monitored. Nitrates will be used if needed. The patient's oral cardiac medications will be continued.    Leotis Pain, MD  09/27/2021 11:28 AM

## 2021-09-27 NOTE — Op Note (Signed)
OPERATIVE NOTE    PRE-OPERATIVE DIAGNOSIS: 1. ESRD 2. Non-functional permcath  POST-OPERATIVE DIAGNOSIS: same as above  PROCEDURE: Fluoroscopic guidance for placement of catheter Placement of a 44 cm tip to cuff tunneled hemodialysis catheter via the right femoral vein and removal of previous catheter  SURGEON: Leotis Pain, MD  ANESTHESIA:  Local with moderate conscious sedation for 17 minutes using 1 mg of Versed and 50 mcg of Fentanyl  ESTIMATED BLOOD LOSS: 2 cc  FINDING(S): none  SPECIMEN(S):  None  INDICATIONS:   Patient is a 76 y.o.female who presents with non-functional dialysis catheter and ESRD.  The patient needs long term dialysis access for their ESRD, and a Permcath is necessary.  Risks and benefits are discussed and informed consent is obtained.    DESCRIPTION: After obtaining full informed written consent, the patient was brought back to the vascular suite. The patient received moderate conscious sedation during a face-to-face encounter with me present throughout the entire procedure and supervising the RN monitoring the vital signs, pulse oximetry, telemetry, and mental status throughout the entire procedure. The patient's existing catheter, right thigh and groin were sterilely prepped and draped in a sterile surgical field was created.  The existing catheter was dissected free from the fibrous sheath securing the cuff with hemostats and blunt dissection.  A wire was placed. The existing catheter was then removed and the wire used to keep venous access. I selected a 44 cm tip to cuff tunneled dialysis catheter.  Using fluoroscopic guidance the catheter tips were parked in the right atrium. The appropriate distal connectors were placed. It withdrew blood well and flushed easily with heparinized saline and a concentrated heparin solution was then placed. It was secured to the chest wall with 2 Prolene sutures. A 4-0 Monocryl pursestring suture was placed around the exit site.  Sterile dressings were placed. The patient tolerated the procedure well and was taken to the recovery room in stable condition.  COMPLICATIONS: None  CONDITION: Stable  Leotis Pain 09/27/2021 2:05 PM   This note was created with Dragon Medical transcription system. Any errors in dictation are purely unintentional.

## 2021-09-27 NOTE — Telephone Encounter (Signed)
Received a fax from [place] at [facility] for the patient to have permcath exchange. Patient is schedule today with a 12:00 pm arrival time to the MM. Pre-procedure instructions were discussed and patient stated she understood.

## 2021-09-28 ENCOUNTER — Ambulatory Visit (INDEPENDENT_AMBULATORY_CARE_PROVIDER_SITE_OTHER): Payer: Medicare Other | Admitting: Nurse Practitioner

## 2021-09-28 ENCOUNTER — Encounter (INDEPENDENT_AMBULATORY_CARE_PROVIDER_SITE_OTHER): Payer: Medicare Other

## 2021-09-28 ENCOUNTER — Encounter: Payer: Self-pay | Admitting: Vascular Surgery

## 2021-09-28 ENCOUNTER — Encounter (INDEPENDENT_AMBULATORY_CARE_PROVIDER_SITE_OTHER): Payer: Self-pay

## 2021-11-01 ENCOUNTER — Telehealth (INDEPENDENT_AMBULATORY_CARE_PROVIDER_SITE_OTHER): Payer: Self-pay

## 2021-11-01 NOTE — Telephone Encounter (Signed)
Received a fax from [facility] for the patient to have a permcath exchange. Patient is scheduled for 11/02/21 with a 10:00 am arrival time to the MM. Pre-procedure instructions will be faxed to [facility] attention [place]

## 2021-11-02 ENCOUNTER — Encounter
Admission: RE | Disposition: A | Discharge status: Home / Self Care | Payer: Self-pay | Source: Home / Self Care | Attending: Vascular Surgery

## 2021-11-02 ENCOUNTER — Encounter: Payer: Self-pay | Admitting: Vascular Surgery

## 2021-11-02 ENCOUNTER — Ambulatory Visit
Admission: RE | Admit: 2021-11-02 | Discharge: 2021-11-02 | Disposition: A | Discharge status: Home / Self Care | Payer: Medicare Other | Attending: Vascular Surgery | Admitting: Vascular Surgery

## 2021-11-02 ENCOUNTER — Other Ambulatory Visit: Payer: Self-pay

## 2021-11-02 DIAGNOSIS — I2510 Atherosclerotic heart disease of native coronary artery without angina pectoris: Secondary | ICD-10-CM | POA: Insufficient documentation

## 2021-11-02 DIAGNOSIS — N186 End stage renal disease: Secondary | ICD-10-CM | POA: Insufficient documentation

## 2021-11-02 DIAGNOSIS — Y841 Kidney dialysis as the cause of abnormal reaction of the patient, or of later complication, without mention of misadventure at the time of the procedure: Secondary | ICD-10-CM | POA: Diagnosis not present

## 2021-11-02 DIAGNOSIS — I120 Hypertensive chronic kidney disease with stage 5 chronic kidney disease or end stage renal disease: Secondary | ICD-10-CM | POA: Insufficient documentation

## 2021-11-02 DIAGNOSIS — Z992 Dependence on renal dialysis: Secondary | ICD-10-CM | POA: Insufficient documentation

## 2021-11-02 DIAGNOSIS — E1122 Type 2 diabetes mellitus with diabetic chronic kidney disease: Secondary | ICD-10-CM | POA: Diagnosis not present

## 2021-11-02 DIAGNOSIS — T8249XA Other complication of vascular dialysis catheter, initial encounter: Secondary | ICD-10-CM | POA: Insufficient documentation

## 2021-11-02 HISTORY — PX: DIALYSIS/PERMA CATHETER INSERTION: CATH118288

## 2021-11-02 LAB — POTASSIUM (ARMC VASCULAR LAB ONLY): Potassium (ARMC vascular lab): 4.8 mmol/L (ref 3.5–5.1)

## 2021-11-02 LAB — GLUCOSE, CAPILLARY
Glucose-Capillary: 82 mg/dL (ref 70–99)
Glucose-Capillary: 96 mg/dL (ref 70–99)

## 2021-11-02 SURGERY — DIALYSIS/PERMA CATHETER INSERTION
Anesthesia: Moderate Sedation

## 2021-11-02 MED ORDER — HEPARIN SODIUM (PORCINE) 10000 UNIT/ML IJ SOLN
INTRAMUSCULAR | Status: AC
  Filled 2021-11-02: qty 1

## 2021-11-02 MED ORDER — HYDROMORPHONE HCL 1 MG/ML IJ SOLN: 1.0000 mg | Freq: Once | INTRAMUSCULAR | Status: DC | PRN

## 2021-11-02 MED ORDER — MIDAZOLAM HCL 2 MG/2ML IJ SOLN
INTRAMUSCULAR | Status: DC | PRN
  Administered 2021-11-02 (×2): 1 mg via INTRAVENOUS

## 2021-11-02 MED ORDER — MIDAZOLAM HCL 2 MG/ML PO SYRP: 8.0000 mg | ORAL_SOLUTION | Freq: Once | ORAL | Status: DC | PRN

## 2021-11-02 MED ORDER — ONDANSETRON HCL 4 MG/2ML IJ SOLN: 4.0000 mg | Freq: Four times a day (QID) | INTRAMUSCULAR | Status: DC | PRN

## 2021-11-02 MED ORDER — FENTANYL CITRATE (PF) 100 MCG/2ML IJ SOLN
INTRAMUSCULAR | Status: AC
  Filled 2021-11-02: qty 4

## 2021-11-02 MED ORDER — SODIUM CHLORIDE 0.9 % IV SOLN: INTRAVENOUS | Status: DC

## 2021-11-02 MED ORDER — VANCOMYCIN HCL IN DEXTROSE 1-5 GM/200ML-% IV SOLN
1000.0000 mg | INTRAVENOUS | Status: AC
  Administered 2021-11-02: 1000 mg via INTRAVENOUS
  Filled 2021-11-02 (×2): qty 200

## 2021-11-02 MED ORDER — METHYLPREDNISOLONE SODIUM SUCC 125 MG IJ SOLR: 125.0000 mg | Freq: Once | INTRAMUSCULAR | Status: DC | PRN

## 2021-11-02 MED ORDER — FENTANYL CITRATE (PF) 100 MCG/2ML IJ SOLN
INTRAMUSCULAR | Status: DC | PRN
  Administered 2021-11-02 (×2): 25 ug via INTRAVENOUS

## 2021-11-02 MED ORDER — FAMOTIDINE 20 MG PO TABS: 40.0000 mg | ORAL_TABLET | Freq: Once | ORAL | Status: DC | PRN

## 2021-11-02 MED ORDER — LIDOCAINE-EPINEPHRINE (PF) 1 %-1:200000 IJ SOLN
INTRAMUSCULAR | Status: DC | PRN
  Administered 2021-11-02: 10 mL

## 2021-11-02 MED ORDER — DIPHENHYDRAMINE HCL 50 MG/ML IJ SOLN: 50.0000 mg | Freq: Once | INTRAMUSCULAR | Status: DC | PRN

## 2021-11-02 MED ORDER — MIDAZOLAM HCL 2 MG/2ML IJ SOLN
INTRAMUSCULAR | Status: AC
  Filled 2021-11-02: qty 4

## 2021-11-02 SURGICAL SUPPLY — 15 items
BIOPATCH RED 1 DISK 7.0 (GAUZE/BANDAGES/DRESSINGS) ×1 IMPLANT
CATH PALINDROME-P 44CM KIT (CATHETERS) ×1
DERMABOND ADVANCED (GAUZE/BANDAGES/DRESSINGS)
DERMABOND ADVANCED .7 DNX12 (GAUZE/BANDAGES/DRESSINGS) IMPLANT
DRAPE BRACHIAL (DRAPES) IMPLANT
GUIDEWIRE SUPER STIFF .035X180 (WIRE) ×1 IMPLANT
KIT CATH CHRNC PALINDROME PRCS (CATHETERS) IMPLANT
KIT SYRINGE INJ CVI SPIKEX1 (MISCELLANEOUS) ×1 IMPLANT
PACK CARDIAC CATH (CUSTOM PROCEDURE TRAY) ×1 IMPLANT
PROTECTION STATION PRESSURIZED (MISCELLANEOUS) ×2
SET ATX SIMPLICITY (MISCELLANEOUS) IMPLANT
STATION PROTECTION PRESSURIZED (MISCELLANEOUS) IMPLANT
SUT MNCRL AB 4-0 PS2 18 (SUTURE) ×1 IMPLANT
SUT PROLENE 0 CT 1 30 (SUTURE) ×1 IMPLANT
TUBING CIL FLEX 10 FLL-RA (TUBING) IMPLANT

## 2021-11-02 NOTE — H&P (Signed)
[facility] Admission History & Physical  MRN : [id number]  Kelli Nguyen is a 76 y.o. ([birth year]) female who presents with chief complaint of No chief complaint on file. [name]  History of Present Illness:  I am asked to evaluate the patient by the dialysis center. The patient was sent here because they were unable to achieve adequate dialysis yesterday. Furthermore the Center states they were unable to aspirate either lumen of the catheter yesterday.  This problem has been getting worse for about 1 week. The patient is unaware of any other change.   Patient denies pain or tenderness overlying the access.  There is no pain with dialysis.  Patient denies fevers or shaking chills while on dialysis.    There have multiple any past interventions and declots of his multiple different access.  The patient is not chronically hypotensive on dialysis.   Current Facility-Administered Medications  Medication Dose Route Frequency Provider Last Rate Last Admin   0.9 %  sodium chloride infusion   Intravenous Continuous Kris Hartmann, NP       diphenhydrAMINE (BENADRYL) injection 50 mg  50 mg Intravenous Once PRN Kris Hartmann, NP       famotidine (PEPCID) tablet 40 mg  40 mg Oral Once PRN Kris Hartmann, NP       methylPREDNISolone sodium succinate (SOLU-MEDROL) 125 mg/2 mL injection 125 mg  125 mg Intravenous Once PRN Kris Hartmann, NP       midazolam (VERSED) 2 MG/ML syrup 8 mg  8 mg Oral Once PRN Kris Hartmann, NP       vancomycin (VANCOCIN) IVPB 1000 mg/200 mL premix  1,000 mg Intravenous 60 min Pre-Op Kris Hartmann, NP         Past Surgical History:  Procedure Laterality Date   A/V SHUNT INTERVENTION N/A 10/16/2016   Procedure: A/V Shunt Intervention;  Surgeon: Katha Cabal, MD;  Location: [facility];  Service: Cardiovascular;  Laterality: N/A;   A/V SHUNTOGRAM Left 10/16/2016   Procedure: A/V Shuntogram;  Surgeon: Katha Cabal, MD;   Location: [facility];  Service: Cardiovascular;  Laterality: Left;   ABDOMINAL HYSTERECTOMY     CARPAL TUNNEL RELEASE Left 09/27/2015   Procedure: CARPAL TUNNEL RELEASE;  Surgeon: Leanor Kail, MD;  Location: [facility];  Service: Orthopedics;  Laterality: Left;   CATARACT EXTRACTION W/PHACO Right 09/23/2016   Procedure: CATARACT EXTRACTION PHACO AND INTRAOCULAR LENS PLACEMENT (I[facility]) complicated diabetic right;  Surgeon: Ronnell Freshwater, MD;  Location: [facility];  Service: Ophthalmology;  Laterality: Right;  Diabetic - diet controlled Potassium draw before surgery   CATARACT EXTRACTION W/PHACO Left 01/20/2017   Procedure: CATARACT EXTRACTION PHACO AND INTRAOCULAR LENS PLACEMENT ([place]) LEFT DIABETIC;  Surgeon: Eulogio Bear, MD;  Location: [facility];  Service: Ophthalmology;  Laterality: Left;  Diabetic - diet controlled Potassium draw prior to procedure  8:00 ARRIVAL   COLONOSCOPY WITH PROPOFOL N/A 11/28/2014   Procedure: COLONOSCOPY WITH PROPOFOL;  Surgeon: Manya Silvas, MD;  Location: [facility];  Service: Endoscopy;  Laterality: N/A;   CORONARY ARTERY BYPASS GRAFT N/A 2008   Procedure: 3v CORONARY ARTERY BYPASS GRAFT (LIMA-LAD, SVG-OM1, SVG-PDA); Location: [facility]; Surgeon: Jannett Celestine, MD   DIALYSIS/PERMA CATHETER INSERTION Left 01/21/2019   Procedure: DIALYSIS/PERMA CATHETER EXCHANGE;  Surgeon: Algernon Huxley, MD;  Location: [facility];  Service: Cardiovascular;  Laterality: Left;   DIALYSIS/PERMA CATHETER INSERTION N/A 08/06/2021   Procedure:  DIALYSIS/PERMA CATHETER INSERTION;  Surgeon: Algernon Huxley, MD;  Location: [facility];  Service: Cardiovascular;  Laterality: N/A;   DIALYSIS/PERMA CATHETER INSERTION N/A 09/27/2021   Procedure: DIALYSIS/PERMA CATHETER INSERTION;  Surgeon: Algernon Huxley, MD;  Location: [facility];  Service: Cardiovascular;  Laterality: N/A;   DIALYSIS/PERMA CATHETER REMOVAL N/A 03/12/2021    Procedure: DIALYSIS/PERMA CATHETER REMOVAL;  Surgeon: Algernon Huxley, MD;  Location: [facility];  Service: Cardiovascular;  Laterality: N/A;   HEMIARTHROPLASTY HIP Left 2018   INSERTION OF DIALYSIS CATHETER Left 01/19/2019   Procedure: INSERTION OF DIALYSIS CATHETER;  Surgeon: Katha Cabal, MD;  Location: [facility];  Service: Vascular;  Laterality: Left;   LEFT HEART CATH AND CORS/GRAFTS ANGIOGRAPHY N/A 02/26/2014   Procedure: LEFT HEART CATH AND CORS/GRAFTS ANGIOGRAPHY; Location: [facility]; Surgeon: Trula Slade, MD   LIGATIONS OF HERO GRAFT Left 01/19/2019   Procedure: EXCISION LEFT HERO GRAFT;  Surgeon: Katha Cabal, MD;  Location: [facility];  Service: Vascular;  Laterality: Left;   NEPHRECTOMY Left    PERIPHERAL VASCULAR CATHETERIZATION Left 01/18/2015   Procedure: A/V Shuntogram/Fistulagram;  Surgeon: Katha Cabal, MD;  Location: [facility];  Service: Cardiovascular;  Laterality: Left;   PERIPHERAL VASCULAR CATHETERIZATION Left 01/18/2015   Procedure: A/V Shunt Intervention;  Surgeon: Katha Cabal, MD;  Location: [facility];  Service: Cardiovascular;  Laterality: Left;   PERIPHERAL VASCULAR CATHETERIZATION N/A 08/10/2015   Procedure: Thrombectomy;  Surgeon: Algernon Huxley, MD;  Location: [facility];  Service: Cardiovascular;  Laterality: N/A;   PERIPHERAL VASCULAR CATHETERIZATION N/A 08/10/2015   Procedure: A/V Shuntogram/Fistulagram;  Surgeon: Algernon Huxley, MD;  Location: [facility];  Service: Cardiovascular;  Laterality: N/A;   PERIPHERAL VASCULAR CATHETERIZATION N/A 08/10/2015   Procedure: A/V Shunt Intervention;  Surgeon: Algernon Huxley, MD;  Location: [facility];  Service: Cardiovascular;  Laterality: N/A;   PERIPHERAL VASCULAR THROMBECTOMY Left 01/12/2019   Procedure: PERIPHERAL VASCULAR THROMBECTOMY;  Surgeon: Katha Cabal, MD;  Location: [facility];  Service: Cardiovascular;  Laterality: Left;   REMOVAL  OF A DIALYSIS CATHETER Right 01/19/2019   Procedure: REMOVAL OF A DIALYSIS CATHETER;  Surgeon: Katha Cabal, MD;  Location: [facility];  Service: Vascular;  Laterality: Right;   RENAL SHUNTS     UPPER EXTREMITY ANGIOGRAPHY Right 12/20/2020   Procedure: UPPER EXTREMITY ANGIOGRAPHY;  Surgeon: Katha Cabal, MD;  Location: [facility];  Service: Cardiovascular;  Laterality: Right;   UPPER EXTREMITY ANGIOGRAPHY Right 06/22/2021   Procedure: Upper Extremity Angiography;  Surgeon: Katha Cabal, MD;  Location: [facility];  Service: Cardiovascular;  Laterality: Right;   UPPER EXTREMITY VENOGRAPHY Right 03/20/2020   Procedure: UPPER EXTREMITY VENOGRAPHY;  Surgeon: Algernon Huxley, MD;  Location: [facility];  Service: Cardiovascular;  Laterality: Right;   VASCULAR ACCESS DEVICE INSERTION Right 11/24/2020   Procedure: INSERTION OF HERO VASCULAR ACCESS DEVICE (GRAFT);  Surgeon: Katha Cabal, MD;  Location: [facility];  Service: Vascular;  Laterality: Right;    Social History Social History   Tobacco Use   Smoking status: Never   Smokeless tobacco: Never  Vaping Use   Vaping Use: Never used  Substance Use Topics   Alcohol use: No   Drug use: No    Family History Family History  Problem Relation Age of Onset   Hypertension Sister     No family history of  bleeding or clotting disorders, autoimmune disease or porphyria  Allergies  Allergen Reactions   Gadolinium Derivatives Hives    Other reaction(s): Unknown Other reaction(s): Other (See Comments) Other reaction(s): Unknown   Penicillins Hives, Itching and Swelling    Has patient had a PCN reaction causing immediate rash, facial/tongue/throat swelling, SOB or lightheadedness with hypotension: No Has patient had a PCN reaction causing severe rash involving mucus membranes or skin necrosis: No Has patient had a PCN reaction that required hospitalization: No Has patient had a PCN reaction occurring  within the last 10 years: No If all of the above answers are "NO", then may proceed with Cephalosporin use.  Penicillin allergy assessment completed on 03/22/20. Patient has received and tolerated cepholosporins incuding cefepime, ceftriaxone.    Codeine Hives   Codeine Sulfate Nausea Only   Contrast Media [Iodinated Contrast Media] Hives   Gadobutrol Hives   Metrizamide Hives   Statins Other (See Comments)    muscle pain     REVIEW OF SYSTEMS (Negative unless checked)  Constitutional: '[]'$ Weight loss  '[]'$ Fever  '[]'$ Chills Cardiac: '[]'$ Chest pain   '[]'$ Chest pressure   '[]'$ Palpitations   '[]'$ Shortness of breath when laying flat   '[]'$ Shortness of breath at rest   '[x]'$ Shortness of breath with exertion. Vascular:  '[]'$ Pain in legs with walking   '[]'$ Pain in legs at rest   '[]'$ Pain in legs when laying flat   '[]'$ Claudication   '[]'$ Pain in feet when walking  '[]'$ Pain in feet at rest  '[]'$ Pain in feet when laying flat   '[]'$ History of DVT   '[]'$ Phlebitis   '[]'$ Swelling in legs   '[]'$ Varicose veins   '[]'$ Non-healing ulcers Pulmonary:   '[]'$ Uses home oxygen   '[]'$ Productive cough   '[]'$ Hemoptysis   '[]'$ Wheeze  '[]'$ COPD   '[]'$ Asthma Neurologic:  '[]'$ Dizziness  '[]'$ Blackouts   '[]'$ Seizures   '[]'$ History of stroke   '[]'$ History of TIA  '[]'$ Aphasia   '[]'$ Temporary blindness   '[]'$ Dysphagia   '[]'$ Weakness or numbness in arms   '[]'$ Weakness or numbness in legs Musculoskeletal:  '[]'$ Arthritis   '[]'$ Joint swelling   '[]'$ Joint pain   '[]'$ Low back pain Hematologic:  '[]'$ Easy bruising  '[]'$ Easy bleeding   '[]'$ Hypercoagulable state   '[x]'$ Anemic  '[]'$ Hepatitis Gastrointestinal:  '[]'$ Blood in stool   '[]'$ Vomiting blood  '[x]'$ Gastroesophageal reflux/heartburn   '[]'$ Difficulty swallowing. Genitourinary:  '[x]'$ Chronic kidney disease   '[]'$ Difficult urination  '[]'$ Frequent urination  '[]'$ Burning with urination   '[]'$ Blood in urine Skin:  '[]'$ Rashes   '[]'$ Ulcers   '[]'$ Wounds Psychological:  '[]'$ History of anxiety   '[]'$  History of major depression.  Physical Examination  There were no vitals filed for this visit. There is no  height or weight on file to calculate BMI. Gen: WD/WN, NAD Head: /AT, No temporalis wasting.  Ear/Nose/Throat: Hearing grossly intact, nares w/o erythema or drainage, oropharynx w/o Erythema/Exudate,  Eyes: Conjunctiva clear, sclera non-icteric Neck: Trachea midline.  No JVD.  Pulmonary:  Good air movement, respirations not labored, no use of accessory muscles.  Cardiac: RRR, normal S1, S2. Vascular: right tunneled catheter without tenderness or drainage Vessel Right Left  Radial Palpable Palpable  Gastrointestinal: soft, non-tender/non-distended. No guarding/reflex.  Musculoskeletal: M/S 5/5 throughout.  Extremities without ischemic changes.  No deformity or atrophy.  Neurologic: Sensation grossly intact in extremities.  Symmetrical.  Speech is fluent. Motor exam as listed above. Psychiatric: Judgment intact, Mood & affect appropriate for pt's clinical situation. Dermatologic: No rashes or ulcers noted.  No cellulitis or open wounds. Lymph : No Cervical, Axillary, or Inguinal lymphadenopathy.   CBC  Lab Results  Component Value Date   WBC 5.7 07/26/2021   HGB 12.9 08/03/2021   HCT 38.0 08/03/2021   MCV 88.2 07/26/2021   PLT 228 07/26/2021    BMET    Component Value Date/Time   NA 137 08/03/2021 0845   NA 135 (L) 03/29/2013 1409   K 5.2 (H) 08/03/2021 0845   K 4.5 03/29/2013 1409   CL 98 08/03/2021 0845   CL 97 (L) 03/29/2013 1409   CO2 31 07/26/2021 1108   CO2 27 03/29/2013 1409   GLUCOSE 98 08/03/2021 0845   GLUCOSE 97 03/29/2013 1409   BUN 41 (H) 08/03/2021 0845   BUN 48 (H) 03/29/2013 1409   CREATININE 6.70 (H) 08/03/2021 0845   CREATININE 12.06 (H) 03/29/2013 1409   CALCIUM 8.4 (L) 07/26/2021 1108   CALCIUM 8.8 03/29/2013 1409   GFRNONAA 13 (L) 07/26/2021 1108   GFRNONAA 3 (L) 03/29/2013 1409   GFRAA 3 (L) 01/21/2019 0504   GFRAA 3 (L) 03/29/2013 1409   CrCl cannot be calculated (Patient's most recent lab result is older than the maximum 21 days  allowed.).  [facility] Lab Results  Component Value Date   INR 1.1 11/30/2020   INR 1.1 11/25/2020   INR 1.1 11/17/2020    Radiology No results found.  Assessment/Plan 1.  Complication dialysis device with thrombosis AV access:  Patient's tunneled catheter is thrombosed. The patient will undergo exchange of the catheter same venous access using interventional techniques.  The risks and benefits were described to the patient.  All questions were answered.  The patient agrees to proceed with intervention.  2.  End-stage renal disease requiring hemodialysis:  Patient will continue dialysis therapy without further interruption if a successful exchange is not achieved then new site will be found for tunneled catheter placement. Dialysis has already been arranged since the patient missed their previous session 3.  Hypertension:  Patient will continue medical management; nephrology is following no changes in oral medications. 4. Diabetes mellitus:  Glucose will be monitored and oral medications been held this morning once the patient has undergone the patient's procedure po intake will be reinitiated and again Accu-Cheks will be used to assess the blood glucose level and treat as needed. The patient will be restarted on the patient's usual hypoglycemic regime 5.  Coronary artery disease:  EKG will be monitored. Nitrates will be used if needed. The patient's oral cardiac medications will be continued.    Leotis Pain, MD  11/02/2021 10:04 AM

## 2021-11-02 NOTE — Op Note (Signed)
OPERATIVE NOTE    PRE-OPERATIVE DIAGNOSIS: 1. ESRD 2. Non-functional permcath  POST-OPERATIVE DIAGNOSIS: same as above  PROCEDURE: Fluoroscopic guidance for placement of catheter Placement of a 44 cm tip to cuff tunneled hemodialysis catheter via the right femoral vein and removal of previous catheter  SURGEON: Leotis Pain, MD  ANESTHESIA:  Local with moderate conscious sedation for less than ten minutes using 2 mg of Versed and 50 mcg of Fentanyl  ESTIMATED BLOOD LOSS: 3 cc  FINDING(S): none  SPECIMEN(S):  None  INDICATIONS:   Patient is a 76 y.o.female who presents with non-functional dialysis catheter and ESRD.  The patient needs long term dialysis access for their ESRD, and a Permcath is necessary.  Risks and benefits are discussed and informed consent is obtained.    DESCRIPTION: After obtaining full informed written consent, the patient was brought back to the vascular suite. The patient received moderate conscious sedation during a face-to-face encounter with me present throughout the entire procedure and supervising the RN monitoring the vital signs, pulse oximetry, telemetry, and mental status throughout the entire procedure. The patient's existing catheter, right thigh, groin were sterilely prepped and draped in a sterile surgical field was created.  The existing catheter was dissected free from the fibrous sheath securing the cuff with hemostats and blunt dissection.  A wire was placed. The existing catheter was then removed and the wire used to keep venous access. I selected a 44 cm tip to cuff tunneled dialysis catheter.  Using fluoroscopic guidance the catheter tips were parked in the right atrium. The appropriate distal connectors were placed. It withdrew blood well and flushed easily with heparinized saline and a concentrated heparin solution was then placed. It was secured to the chest wall with 2 Prolene sutures. A 4-0 Monocryl pursestring suture was placed around the exit  site. Sterile dressings were placed. The patient tolerated the procedure well and was taken to the recovery room in stable condition.  COMPLICATIONS: None  CONDITION: Stable  Leotis Pain 11/02/2021 12:13 PM   This note was created with Dragon Medical transcription system. Any errors in dictation are purely unintentional.

## 2021-11-22 ENCOUNTER — Telehealth (INDEPENDENT_AMBULATORY_CARE_PROVIDER_SITE_OTHER): Payer: Self-pay | Admitting: Nurse Practitioner

## 2021-11-22 NOTE — Telephone Encounter (Signed)
I spoke with the patient and her daughter stated she has appointment schedule with PCP today

## 2021-11-22 NOTE — Telephone Encounter (Signed)
Patient's daughter called and wanted advice on what she should do for her mother.  She stated her mother cannot walk for without giving out of breath.  Please advise.

## 2021-11-26 ENCOUNTER — Other Ambulatory Visit: Payer: Self-pay | Admitting: Student

## 2021-11-26 DIAGNOSIS — Z780 Asymptomatic menopausal state: Secondary | ICD-10-CM

## 2021-12-05 ENCOUNTER — Encounter (INDEPENDENT_AMBULATORY_CARE_PROVIDER_SITE_OTHER): Payer: Self-pay | Admitting: Nurse Practitioner

## 2021-12-05 ENCOUNTER — Encounter (INDEPENDENT_AMBULATORY_CARE_PROVIDER_SITE_OTHER): Payer: Medicare Other

## 2021-12-05 ENCOUNTER — Ambulatory Visit (INDEPENDENT_AMBULATORY_CARE_PROVIDER_SITE_OTHER): Payer: Medicare Other | Admitting: Nurse Practitioner

## 2021-12-05 ENCOUNTER — Other Ambulatory Visit (INDEPENDENT_AMBULATORY_CARE_PROVIDER_SITE_OTHER): Payer: Medicare Other

## 2021-12-05 VITALS — BP 136/80 | HR 79 | Resp 17 | Ht 59.0 in | Wt 156.6 lb

## 2021-12-05 DIAGNOSIS — N186 End stage renal disease: Secondary | ICD-10-CM

## 2021-12-05 DIAGNOSIS — T8452XA Infection and inflammatory reaction due to internal left hip prosthesis, initial encounter: Secondary | ICD-10-CM | POA: Diagnosis not present

## 2021-12-05 DIAGNOSIS — I10 Essential (primary) hypertension: Secondary | ICD-10-CM | POA: Diagnosis not present

## 2021-12-05 NOTE — Progress Notes (Signed)
Subjective:    Patient ID: Kelli Nguyen, female    DOB: 24-Dec-1945, 76 y.o.   MRN: [id number] Chief Complaint  Patient presents with   Establish Care    Referred by Dr Smith Mince    Kelli Nguyen is a 76 year old female that presents today for evaluation following failed upper extremity access.  The patient has exhausted all options for upper extremity access.  Her most recent upper extremity was lost due to steal syndrome.  The patient notes that she has regained sensation and ability to move upper extremity following the ligation of her right upper extremity access.  Recently the patient had an ABI of 1.23 on the right and 1.22 on the left.  I suspect these numbers are falsely elevated due to medial calcification.  This is because the waveforms appear monophasic and the toe waveforms are nearly flat.    Review of Systems  Neurological:  Positive for weakness.  All other systems reviewed and are negative.      Objective:   Physical Exam Vitals reviewed.  HENT:     Head: Normocephalic.  Cardiovascular:     Rate and Rhythm: Normal rate.     Pulses:          Dorsalis pedis pulses are detected w/ Doppler on the right side and detected w/ Doppler on the left side.       Posterior tibial pulses are detected w/ Doppler on the right side and detected w/ Doppler on the left side.  Pulmonary:     Effort: Pulmonary effort is normal.  Skin:    General: Skin is warm and dry.  Neurological:     Mental Status: She is alert and oriented to person, place, and time.  Psychiatric:        Mood and Affect: Mood normal.        Behavior: Behavior normal.        Thought Content: Thought content normal.        Judgment: Judgment normal.     BP 136/80 (BP Location: Left Arm)   Pulse 79   Resp 17   Ht '4\' 11"'$  (1.499 m)   Wt 156 lb 9.6 oz (71 kg)   BMI 31.63 kg/m   Past Medical History:  Diagnosis Date   Anemia    Carpal tunnel syndrome of left wrist    Cataract, bilateral    Chronic cough     Closed displaced fracture of left femoral neck (HCC) 03/09/2017   Coronary artery disease    a.) s/p 3v CABG (LIMA-LAD, SVG-OM1, SVG-PDA) in 2008. b.) LHC 02/26/2014: 70% dLM. 80% pLAD, 70-80% mLCx, chronic CTO in the RCA; SVG-OM1 and SVG-PDA patent; 80-90% LIMA-LAD at the anastamosis and 80-90% at dLAD after anastamosis; very tortous LEFT subclavian aftery LIMA takeoff (difficult engagement of LIMA via femoral approach).   Diabetes mellitus without complication (HCC)    Diastolic dysfunction    a.) TTE 09/20/2020: EF 55-60%; low normal RVSF; RVSP elevated at 40.7 mmHg; mild TR; G2DD.   ESRD (end stage renal disease) on dialysis ([place])    a.) T-Th-Sat   First degree AV block    GERD (gastroesophageal reflux disease)    Glaucoma    HLD (hyperlipidemia)    Hypertension    Murmur    OA (osteoarthritis)    PAD (peripheral artery disease) (HCC)    Pneumonia    Polyneuropathy    feet and hands   Pulmonary hypertension (HCC)    RBBB  Secondary hyperparathyroidism of renal origin ([place])    Sepsis ([place]) 06/28/2017   Wears dentures    partial upper and lower    Social History   Socioeconomic History   Marital status: Widowed    Spouse name: Not on file   Number of children: 5   Years of education: Not on file   Highest education level: Not on file  Occupational History   Not on file  Tobacco Use   Smoking status: Never   Smokeless tobacco: Never  Vaping Use   Vaping Use: Never used  Substance and Sexual Activity   Alcohol use: No   Drug use: No   Sexual activity: Not on file  Other Topics Concern   Not on file  Social History Narrative   Lives with son   Social Determinants of Health   Financial Resource Strain: Not on file  Food Insecurity: Not on file  Transportation Needs: Not on file  Physical Activity: Not on file  Stress: Not on file  Social Connections: Not on file  Intimate Partner Violence: Not on file    Past Surgical History:  Procedure Laterality  Date   A/V SHUNT INTERVENTION N/A 10/16/2016   Procedure: A/V Shunt Intervention;  Surgeon: Katha Cabal, MD;  Location: [place] [facility];  Service: Cardiovascular;  Laterality: N/A;   A/V SHUNTOGRAM Left 10/16/2016   Procedure: A/V Shuntogram;  Surgeon: Katha Cabal, MD;  Location: [place];  Service: Cardiovascular;  Laterality: Left;   ABDOMINAL HYSTERECTOMY     CARPAL TUNNEL RELEASE Left 09/27/2015   Procedure: CARPAL TUNNEL RELEASE;  Surgeon: Leanor Kail, MD;  Location: [facility];  Service: Orthopedics;  Laterality: Left;   CATARACT EXTRACTION W/PHACO Right 09/23/2016   Procedure: CATARACT EXTRACTION PHACO AND INTRAOCULAR LENS PLACEMENT (IOC) complicated diabetic right;  Surgeon: Ronnell Freshwater, MD;  Location: [facility];  Service: Ophthalmology;  Laterality: Right;  Diabetic - diet controlled Potassium draw before surgery   CATARACT EXTRACTION W/PHACO Left 01/20/2017   Procedure: CATARACT EXTRACTION PHACO AND INTRAOCULAR LENS PLACEMENT ([place]) LEFT DIABETIC;  Surgeon: Eulogio Bear, MD;  Location: [place];  Service: Ophthalmology;  Laterality: Left;  Diabetic - diet controlled Potassium draw prior to procedure  8:00 ARRIVAL   COLONOSCOPY WITH PROPOFOL N/A 11/28/2014   Procedure: COLONOSCOPY WITH PROPOFOL;  Surgeon: Manya Silvas, MD;  Location: [facility];  Service: Endoscopy;  Laterality: N/A;   CORONARY ARTERY BYPASS GRAFT N/A 2008   Procedure: 3v CORONARY ARTERY BYPASS GRAFT (LIMA-LAD, SVG-OM1, SVG-PDA); Location: [facility]; Surgeon: Jannett Celestine, MD   DIALYSIS/PERMA CATHETER INSERTION Left 01/21/2019   Procedure: DIALYSIS/PERMA CATHETER EXCHANGE;  Surgeon: Algernon Huxley, MD;  Location: [place] [facility];  Service: Cardiovascular;  Laterality: Left;   DIALYSIS/PERMA CATHETER INSERTION N/A 08/06/2021   Procedure: DIALYSIS/PERMA CATHETER INSERTION;  Surgeon: Algernon Huxley, MD;  Location: [place] [facility];  Service:  Cardiovascular;  Laterality: N/A;   DIALYSIS/PERMA CATHETER INSERTION N/A 09/27/2021   Procedure: DIALYSIS/PERMA CATHETER INSERTION;  Surgeon: Algernon Huxley, MD;  Location: [place] [facility];  Service: Cardiovascular;  Laterality: N/A;   DIALYSIS/PERMA CATHETER INSERTION N/A 11/02/2021   Procedure: DIALYSIS/PERMA CATHETER INSERTION;  Surgeon: Algernon Huxley, MD;  Location: [place] [facility];  Service: Cardiovascular;  Laterality: N/A;   DIALYSIS/PERMA CATHETER REMOVAL N/A 03/12/2021   Procedure: DIALYSIS/PERMA CATHETER REMOVAL;  Surgeon: Algernon Huxley, MD;  Location: [place] [facility];  Service: Cardiovascular;  Laterality: N/A;  HEMIARTHROPLASTY HIP Left 2018   INSERTION OF DIALYSIS CATHETER Left 01/19/2019   Procedure: INSERTION OF DIALYSIS CATHETER;  Surgeon: Katha Cabal, MD;  Location: [facility];  Service: Vascular;  Laterality: Left;   LEFT HEART CATH AND CORS/GRAFTS ANGIOGRAPHY N/A 02/26/2014   Procedure: LEFT HEART CATH AND CORS/GRAFTS ANGIOGRAPHY; Location: [facility]; Surgeon: Trula Slade, MD   LIGATIONS OF HERO GRAFT Left 01/19/2019   Procedure: EXCISION LEFT HERO GRAFT;  Surgeon: Katha Cabal, MD;  Location: [facility];  Service: Vascular;  Laterality: Left;   NEPHRECTOMY Left    PERIPHERAL VASCULAR CATHETERIZATION Left 01/18/2015   Procedure: A/V Shuntogram/Fistulagram;  Surgeon: Katha Cabal, MD;  Location: [place] [facility];  Service: Cardiovascular;  Laterality: Left;   PERIPHERAL VASCULAR CATHETERIZATION Left 01/18/2015   Procedure: A/V Shunt Intervention;  Surgeon: Katha Cabal, MD;  Location: [place] [facility];  Service: Cardiovascular;  Laterality: Left;   PERIPHERAL VASCULAR CATHETERIZATION N/A 08/10/2015   Procedure: Thrombectomy;  Surgeon: Algernon Huxley, MD;  Location: [place] [facility];  Service: Cardiovascular;  Laterality: N/A;   PERIPHERAL VASCULAR CATHETERIZATION N/A 08/10/2015   Procedure: A/V Shuntogram/Fistulagram;  Surgeon: Algernon Huxley, MD;   Location: [place] [facility];  Service: Cardiovascular;  Laterality: N/A;   PERIPHERAL VASCULAR CATHETERIZATION N/A 08/10/2015   Procedure: A/V Shunt Intervention;  Surgeon: Algernon Huxley, MD;  Location: [place] [facility];  Service: Cardiovascular;  Laterality: N/A;   PERIPHERAL VASCULAR THROMBECTOMY Left 01/12/2019   Procedure: PERIPHERAL VASCULAR THROMBECTOMY;  Surgeon: Katha Cabal, MD;  Location: [place] [facility];  Service: Cardiovascular;  Laterality: Left;   REMOVAL OF A DIALYSIS CATHETER Right 01/19/2019   Procedure: REMOVAL OF A DIALYSIS CATHETER;  Surgeon: Katha Cabal, MD;  Location: [facility];  Service: Vascular;  Laterality: Right;   RENAL SHUNTS     UPPER EXTREMITY ANGIOGRAPHY Right 12/20/2020   Procedure: UPPER EXTREMITY ANGIOGRAPHY;  Surgeon: Katha Cabal, MD;  Location: [place] [facility];  Service: Cardiovascular;  Laterality: Right;   UPPER EXTREMITY ANGIOGRAPHY Right 06/22/2021   Procedure: Upper Extremity Angiography;  Surgeon: Katha Cabal, MD;  Location: [place] [facility];  Service: Cardiovascular;  Laterality: Right;   UPPER EXTREMITY VENOGRAPHY Right 03/20/2020   Procedure: UPPER EXTREMITY VENOGRAPHY;  Surgeon: Algernon Huxley, MD;  Location: [place] [facility];  Service: Cardiovascular;  Laterality: Right;   VASCULAR ACCESS DEVICE INSERTION Right 11/24/2020   Procedure: INSERTION OF HERO VASCULAR ACCESS DEVICE (GRAFT);  Surgeon: Katha Cabal, MD;  Location: [facility];  Service: Vascular;  Laterality: Right;    Family History  Problem Relation Age of Onset   Hypertension Sister     Allergies  Allergen Reactions   Gadolinium Derivatives Hives    Other reaction(s): Unknown Other reaction(s): Other (See Comments) Other reaction(s): Unknown   Penicillins Hives, Itching and Swelling    Has patient had a PCN reaction causing immediate rash, facial/tongue/throat swelling, SOB or lightheadedness with hypotension: No Has patient had  a PCN reaction causing severe rash involving mucus membranes or skin necrosis: No Has patient had a PCN reaction that required hospitalization: No Has patient had a PCN reaction occurring within the last 10 years: No If all of the above answers are "NO", then may proceed with Cephalosporin use.  Penicillin allergy assessment completed on 03/22/20. Patient has received and tolerated cepholosporins incuding cefepime, ceftriaxone.    Codeine Hives   Codeine Sulfate Nausea Only   Contrast Media [Iodinated  Contrast Media] Hives   Gadobutrol Hives   Metrizamide Hives   Statins Other (See Comments)    muscle pain       Latest Ref Rng & Units 08/03/2021    8:45 AM 07/26/2021   11:08 AM 12/02/2020    9:30 AM  CBC  WBC 4.0 - 10.5 K/uL  5.7  9.6   Hemoglobin 12.0 - 15.0 g/dL 12.9  11.2  8.7   Hematocrit 36.0 - 46.0 % 38.0  37.5  27.0   Platelets 150 - 400 K/uL  228  203       CMP     Component Value Date/Time   NA 137 08/03/2021 0845   NA 135 (L) 03/29/2013 1409   K 5.2 (H) 08/03/2021 0845   K 4.5 03/29/2013 1409   CL 98 08/03/2021 0845   CL 97 (L) 03/29/2013 1409   CO2 31 07/26/2021 1108   CO2 27 03/29/2013 1409   GLUCOSE 98 08/03/2021 0845   GLUCOSE 97 03/29/2013 1409   BUN 41 (H) 08/03/2021 0845   BUN 48 (H) 03/29/2013 1409   CREATININE 6.70 (H) 08/03/2021 0845   CREATININE 12.06 (H) 03/29/2013 1409   CALCIUM 8.4 (L) 07/26/2021 1108   CALCIUM 8.8 03/29/2013 1409   PROT 7.1 11/25/2020 0929   ALBUMIN 3.8 11/25/2020 0929   AST 20 11/25/2020 0929   ALT 10 11/25/2020 0929   ALKPHOS 55 11/25/2020 0929   BILITOT 0.9 11/25/2020 0929   GFRNONAA 13 (L) 07/26/2021 1108   GFRNONAA 3 (L) 03/29/2013 1409   GFRAA 3 (L) 01/21/2019 0504   GFRAA 3 (L) 03/29/2013 1409     No results found.     Assessment & Plan:   1. ESRD (end stage renal disease) ([place]) The patient has exhausted all upper extremity access.  She has had failed hero graft in her bilateral upper extremities.  We  discussed the possibility of placement of a thigh graft.  Given that the patient does have noted calcification of the vessels it would be in her best interest, given her history of steal syndrome to undergo an angiogram prior to placement of lower extremity graft.  We discussed the possibility of remaining with a PermCath and the risk associated with that.  This time the patient wishes to consider her options and will contact us when she makes a decision about moving forward.  2. 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) Given that the patient does have infection within her left hip prosthesis.  This will need to be resolved before we can place any sort of graft for the patient as it places her dialysis graft at an increased risk of infection.  3. Primary hypertension Continue antihypertensive medications as already ordered, these medications have been reviewed and there are no changes at this time.    Current Outpatient Medications on File Prior to Visit  Medication Sig Dispense Refill   acetaminophen (TYLENOL) 500 MG tablet Take 500 mg by mouth every 6 (six) hours as needed for moderate pain or headache.     aspirin 81 MG tablet Take 81 mg by mouth daily.     cephALEXin (KEFLEX) 500 MG capsule Take 500 mg by mouth 2 (two) times daily.     cinacalcet (SENSIPAR) 30 MG tablet Take 30 mg by mouth Every Tuesday,Thursday,and Saturday with dialysis.     clopidogrel (PLAVIX) 75 MG tablet TAKE 1 TABLET BY MOUTH EVERY DAY 90 tablet 1   diclofenac Sodium (VOLTAREN) 1 %  GEL Apply topically.     docusate sodium (COLACE) 100 MG capsule Take 100 mg by mouth daily.     latanoprost (XALATAN) 0.005 % ophthalmic solution Place 1 drop into both eyes at bedtime.     Methoxy PEG-Epoetin Beta (MIRCERA IJ) Mircera     midodrine (PROAMATINE) 5 MG tablet Take 1 tablet (5 mg total) by mouth Every Tuesday,Thursday,and Saturday with dialysis. Please give 30 minutes prior to dialysis.  Do NOT hold for dialysis. GIVE DOSE DURING DAYLIGHT HOURS ONLY     pantoprazole (PROTONIX) 40 MG tablet Take 40 mg by mouth daily.     polyethylene glycol powder (GLYCOLAX/MIRALAX) 17 GM/SCOOP powder Take 17 g every day by oral route.     pravastatin (PRAVACHOL) 20 MG tablet Take 20 mg by mouth daily.     sevelamer carbonate (RENVELA) 800 MG tablet Take 1,600 mg by mouth 3 (three) times daily with meals.     timolol (TIMOPTIC) 0.5 % ophthalmic solution Place 1 drop into both eyes daily.     ZYRTEC ALLERGY 10 MG tablet Take 10 mg by mouth daily.     No current facility-administered medications on file prior to visit.    There are no Patient Instructions on file for this visit. No follow-ups on file.   Kris Hartmann, NP

## 2021-12-05 NOTE — Progress Notes (Incomplete)
Subjective:    Patient ID: Elyn Peers, female    DOB: 1946/01/23, 76 y.o.   MRN: [id number] Chief Complaint  Patient presents with  . Establish Care    Referred by Dr Smith Mince    HPI  Review of Systems     Objective:   Physical Exam  BP 136/80 (BP Location: Left Arm)   Pulse 79   Resp 17   Ht '4\' 11"'$  (1.499 m)   Wt 156 lb 9.6 oz (71 kg)   BMI 31.63 kg/m   Past Medical History:  Diagnosis Date  . Anemia   . Carpal tunnel syndrome of left wrist   . Cataract, bilateral   . Chronic cough   . Closed displaced fracture of left femoral neck ([place]) 03/09/2017  . Coronary artery disease    a.) s/p 3v CABG (LIMA-LAD, SVG-OM1, SVG-PDA) in 2008. b.) LHC 02/26/2014: 70% dLM. 80% pLAD, 70-80% mLCx, chronic CTO in the RCA; SVG-OM1 and SVG-PDA patent; 80-90% LIMA-LAD at the anastamosis and 80-90% at dLAD after anastamosis; very tortous LEFT subclavian aftery LIMA takeoff (difficult engagement of LIMA via femoral approach).  . Diabetes mellitus without complication ([place])   . Diastolic dysfunction    a.) TTE 09/20/2020: EF 55-60%; low normal RVSF; RVSP elevated at 40.7 mmHg; mild TR; G2DD.  [name] ESRD (end stage renal disease) on dialysis [facility])    a.) T-Th-Sat  . First degree AV block   . GERD (gastroesophageal reflux disease)   . Glaucoma   . HLD (hyperlipidemia)   . Hypertension   . Murmur   . OA (osteoarthritis)   . PAD (peripheral artery disease) ([place])   . Pneumonia   . Polyneuropathy    feet and hands  . Pulmonary hypertension ([place])   . RBBB   . Secondary hyperparathyroidism of renal origin ([place])   . Sepsis ([place]) 06/28/2017  . Wears dentures    partial upper and lower    Social History   Socioeconomic History  . Marital status: Widowed    Spouse name: Not on file  . Number of children: 5  . Years of education: Not on file  . Highest education level: Not on file  Occupational History  . Not on file  Tobacco Use  . Smoking status: Never  . Smokeless tobacco: Never   Vaping Use  . Vaping Use: Never used  Substance and Sexual Activity  . Alcohol use: No  . Drug use: No  . Sexual activity: Not on file  Other Topics Concern  . Not on file  Social History Narrative   Lives with son   Social Determinants of Health   Financial Resource Strain: Not on file  Food Insecurity: Not on file  Transportation Needs: Not on file  Physical Activity: Not on file  Stress: Not on file  Social Connections: Not on file  Intimate Partner Violence: Not on file    Past Surgical History:  Procedure Laterality Date  . A/V SHUNT INTERVENTION N/A 10/16/2016   Procedure: A/V Shunt Intervention;  Surgeon: Katha Cabal, MD;  Location: [place] [facility];  Service: Cardiovascular;  Laterality: N/A;  . A/V SHUNTOGRAM Left 10/16/2016   Procedure: A/V Shuntogram;  Surgeon: Katha Cabal, MD;  Location: [place] [facility];  Service: Cardiovascular;  Laterality: Left;  . ABDOMINAL HYSTERECTOMY    . CARPAL TUNNEL RELEASE Left 09/27/2015   Procedure: CARPAL TUNNEL RELEASE;  Surgeon: Leanor Kail, MD;  Location: [facility];  Service: Orthopedics;  Laterality:  Left;  . CATARACT EXTRACTION W/PHACO Right 09/23/2016   Procedure: CATARACT EXTRACTION PHACO AND INTRAOCULAR LENS PLACEMENT (IOC) complicated diabetic right;  Surgeon: Ronnell Freshwater, MD;  Location: [place];  Service: Ophthalmology;  Laterality: Right;  Diabetic - diet controlled Potassium draw before surgery  . CATARACT EXTRACTION W/PHACO Left 01/20/2017   Procedure: CATARACT EXTRACTION PHACO AND INTRAOCULAR LENS PLACEMENT ([place]) LEFT DIABETIC;  Surgeon: Eulogio Bear, MD;  Location: [place];  Service: Ophthalmology;  Laterality: Left;  Diabetic - diet controlled Potassium draw prior to procedure  8:00 ARRIVAL  . COLONOSCOPY WITH PROPOFOL N/A 11/28/2014   Procedure: COLONOSCOPY WITH PROPOFOL;  Surgeon: Manya Silvas, MD;  Location: [facility];  Service:  Endoscopy;  Laterality: N/A;  . CORONARY ARTERY BYPASS GRAFT N/A 2008   Procedure: 3v CORONARY ARTERY BYPASS GRAFT (LIMA-LAD, SVG-OM1, SVG-PDA); Location: [facility]; Surgeon: Jannett Celestine, MD  . DIALYSIS/PERMA CATHETER INSERTION Left 01/21/2019   Procedure: DIALYSIS/PERMA CATHETER EXCHANGE;  Surgeon: Algernon Huxley, MD;  Location: [place] [facility];  Service: Cardiovascular;  Laterality: Left;  . DIALYSIS/PERMA CATHETER INSERTION N/A 08/06/2021   Procedure: DIALYSIS/PERMA CATHETER INSERTION;  Surgeon: Algernon Huxley, MD;  Location: [place] [facility];  Service: Cardiovascular;  Laterality: N/A;  . DIALYSIS/PERMA CATHETER INSERTION N/A 09/27/2021   Procedure: DIALYSIS/PERMA CATHETER INSERTION;  Surgeon: Algernon Huxley, MD;  Location: [place] [facility];  Service: Cardiovascular;  Laterality: N/A;  . DIALYSIS/PERMA CATHETER INSERTION N/A 11/02/2021   Procedure: DIALYSIS/PERMA CATHETER INSERTION;  Surgeon: Algernon Huxley, MD;  Location: [place] [facility];  Service: Cardiovascular;  Laterality: N/A;  . DIALYSIS/PERMA CATHETER REMOVAL N/A 03/12/2021   Procedure: DIALYSIS/PERMA CATHETER REMOVAL;  Surgeon: Algernon Huxley, MD;  Location: [place] [facility];  Service: Cardiovascular;  Laterality: N/A;  . HEMIARTHROPLASTY HIP Left 2018  . INSERTION OF DIALYSIS CATHETER Left 01/19/2019   Procedure: INSERTION OF DIALYSIS CATHETER;  Surgeon: Katha Cabal, MD;  Location: [facility];  Service: Vascular;  Laterality: Left;  . LEFT HEART CATH AND CORS/GRAFTS ANGIOGRAPHY N/A 02/26/2014   Procedure: LEFT HEART CATH AND CORS/GRAFTS ANGIOGRAPHY; Location: [facility]; Surgeon: Trula Slade, MD  . LIGATIONS OF HERO GRAFT Left 01/19/2019   Procedure: EXCISION LEFT HERO GRAFT;  Surgeon: Katha Cabal, MD;  Location: [facility];  Service: Vascular;  Laterality: Left;  . NEPHRECTOMY Left   . PERIPHERAL VASCULAR CATHETERIZATION Left 01/18/2015   Procedure: A/V Shuntogram/Fistulagram;  Surgeon: Katha Cabal, MD;  Location:  [place] [facility];  Service: Cardiovascular;  Laterality: Left;  . PERIPHERAL VASCULAR CATHETERIZATION Left 01/18/2015   Procedure: A/V Shunt Intervention;  Surgeon: Katha Cabal, MD;  Location: [place] [facility];  Service: Cardiovascular;  Laterality: Left;  . PERIPHERAL VASCULAR CATHETERIZATION N/A 08/10/2015   Procedure: Thrombectomy;  Surgeon: Algernon Huxley, MD;  Location: [place] [facility];  Service: Cardiovascular;  Laterality: N/A;  . PERIPHERAL VASCULAR CATHETERIZATION N/A 08/10/2015   Procedure: A/V Shuntogram/Fistulagram;  Surgeon: Algernon Huxley, MD;  Location: [place] [facility];  Service: Cardiovascular;  Laterality: N/A;  . PERIPHERAL VASCULAR CATHETERIZATION N/A 08/10/2015   Procedure: A/V Shunt Intervention;  Surgeon: Algernon Huxley, MD;  Location: [place] [facility];  Service: Cardiovascular;  Laterality: N/A;  . PERIPHERAL VASCULAR THROMBECTOMY Left 01/12/2019   Procedure: PERIPHERAL VASCULAR THROMBECTOMY;  Surgeon: Katha Cabal, MD;  Location: [place] [facility];  Service: Cardiovascular;  Laterality: Left;  . REMOVAL OF A DIALYSIS CATHETER Right 01/19/2019  Procedure: REMOVAL OF A DIALYSIS CATHETER;  Surgeon: Katha Cabal, MD;  Location: [facility];  Service: Vascular;  Laterality: Right;  . RENAL SHUNTS    . UPPER EXTREMITY ANGIOGRAPHY Right 12/20/2020   Procedure: UPPER EXTREMITY ANGIOGRAPHY;  Surgeon: Katha Cabal, MD;  Location: [place] [facility];  Service: Cardiovascular;  Laterality: Right;  . UPPER EXTREMITY ANGIOGRAPHY Right 06/22/2021   Procedure: Upper Extremity Angiography;  Surgeon: Katha Cabal, MD;  Location: [place] [facility];  Service: Cardiovascular;  Laterality: Right;  . UPPER EXTREMITY VENOGRAPHY Right 03/20/2020   Procedure: UPPER EXTREMITY VENOGRAPHY;  Surgeon: Algernon Huxley, MD;  Location: [place] [facility];  Service: Cardiovascular;  Laterality: Right;  [name] VASCULAR ACCESS DEVICE INSERTION Right 11/24/2020    Procedure: INSERTION OF HERO VASCULAR ACCESS DEVICE (GRAFT);  Surgeon: Katha Cabal, MD;  Location: [facility];  Service: Vascular;  Laterality: Right;    Family History  Problem Relation Age of Onset  . Hypertension Sister     Allergies  Allergen Reactions  . Gadolinium Derivatives Hives    Other reaction(s): Unknown Other reaction(s): Other (See Comments) Other reaction(s): Unknown  . Penicillins Hives, Itching and Swelling    Has patient had a PCN reaction causing immediate rash, facial/tongue/throat swelling, SOB or lightheadedness with hypotension: No Has patient had a PCN reaction causing severe rash involving mucus membranes or skin necrosis: No Has patient had a PCN reaction that required hospitalization: No Has patient had a PCN reaction occurring within the last 10 years: No If all of the above answers are "NO", then may proceed with Cephalosporin use.  Penicillin allergy assessment completed on 03/22/20. Patient has received and tolerated cepholosporins incuding cefepime, ceftriaxone.   . Codeine Hives  . Codeine Sulfate Nausea Only  . Contrast Media [Iodinated Contrast Media] Hives  . Gadobutrol Hives  . Metrizamide Hives  . Statins Other (See Comments)    muscle pain       Latest Ref Rng & Units 08/03/2021    8:45 AM 07/26/2021   11:08 AM 12/02/2020    9:30 AM  CBC  WBC 4.0 - 10.5 K/uL  5.7  9.6   Hemoglobin 12.0 - 15.0 g/dL 12.9  11.2  8.7   Hematocrit 36.0 - 46.0 % 38.0  37.5  27.0   Platelets 150 - 400 K/uL  228  203       CMP     Component Value Date/Time   NA 137 08/03/2021 0845   NA 135 (L) 03/29/2013 1409   K 5.2 (H) 08/03/2021 0845   K 4.5 03/29/2013 1409   CL 98 08/03/2021 0845   CL 97 (L) 03/29/2013 1409   CO2 31 07/26/2021 1108   CO2 27 03/29/2013 1409   GLUCOSE 98 08/03/2021 0845   GLUCOSE 97 03/29/2013 1409   BUN 41 (H) 08/03/2021 0845   BUN 48 (H) 03/29/2013 1409   CREATININE 6.70 (H) 08/03/2021 0845   CREATININE 12.06 (H)  03/29/2013 1409   CALCIUM 8.4 (L) 07/26/2021 1108   CALCIUM 8.8 03/29/2013 1409   PROT 7.1 11/25/2020 0929   ALBUMIN 3.8 11/25/2020 0929   AST 20 11/25/2020 0929   ALT 10 11/25/2020 0929   ALKPHOS 55 11/25/2020 0929   BILITOT 0.9 11/25/2020 0929   GFRNONAA 13 (L) 07/26/2021 1108   GFRNONAA 3 (L) 03/29/2013 1409   GFRAA 3 (L) 01/21/2019 0504   GFRAA 3 (L) 03/29/2013 1409     No results found.  Assessment & Plan:   1. ESRD (end stage renal disease) ([place]) The patient has exhausted all upper extremity access.  She has had failed hero graft in her bilateral upper extremities.  We discussed the possibility of placement of a thigh graft.  Given that the patient does have noted calcification of the vessels it would be in her best interest, given her history of steal syndrome to undergo an angiogram prior to placement of lower extremity graft.  2. 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  This will need to be resolved before we can place any sort of graft for the patient as it places her dialysis graft at an increased risk of infection.  3. Primary hypertension Continue antihypertensive medications as already ordered, these medications have been reviewed and there are no changes at this time.    Current Outpatient Medications on File Prior to Visit  Medication Sig Dispense Refill  . acetaminophen (TYLENOL) 500 MG tablet Take 500 mg by mouth every 6 (six) hours as needed for moderate pain or headache.    [name] aspirin 81 MG tablet Take 81 mg by mouth daily.    . cephALEXin (KEFLEX) 500 MG capsule Take 500 mg by mouth 2 (two) times daily.    . cinacalcet (SENSIPAR) 30 MG tablet Take 30 mg by mouth Every Tuesday,Thursday,and Saturday with dialysis.    [name] clopidogrel (PLAVIX) 75 MG tablet TAKE 1 TABLET BY MOUTH EVERY DAY 90 tablet 1  . diclofenac Sodium (VOLTAREN) 1 % GEL Apply  topically.    . docusate sodium (COLACE) 100 MG capsule Take 100 mg by mouth daily.    [name] latanoprost (XALATAN) 0.005 % ophthalmic solution Place 1 drop into both eyes at bedtime.    . Methoxy PEG-Epoetin Beta (MIRCERA IJ) Mircera    . midodrine (PROAMATINE) 5 MG tablet Take 1 tablet (5 mg total) by mouth Every Tuesday,Thursday,and Saturday with dialysis. Please give 30 minutes prior to dialysis. Do NOT hold for dialysis. GIVE DOSE DURING DAYLIGHT HOURS ONLY    . pantoprazole (PROTONIX) 40 MG tablet Take 40 mg by mouth daily.    . polyethylene glycol powder (GLYCOLAX/MIRALAX) 17 GM/SCOOP powder Take 17 g every day by oral route.    . pravastatin (PRAVACHOL) 20 MG tablet Take 20 mg by mouth daily.    . sevelamer carbonate (RENVELA) 800 MG tablet Take 1,600 mg by mouth 3 (three) times daily with meals.    . timolol (TIMOPTIC) 0.5 % ophthalmic solution Place 1 drop into both eyes daily.    [name] ZYRTEC ALLERGY 10 MG tablet Take 10 mg by mouth daily.     No current facility-administered medications on file prior to visit.    There are no Patient Instructions on file for this visit. No follow-ups on file.   Kris Hartmann, NP

## 2021-12-17 ENCOUNTER — Inpatient Hospital Stay: Admission: RE | Admit: 2021-12-17 | Payer: Medicare Other | Source: Ambulatory Visit

## 2022-01-14 ENCOUNTER — Encounter
Admission: RE | Disposition: A | Discharge status: Home / Self Care | Payer: Self-pay | Source: Ambulatory Visit | Attending: Vascular Surgery

## 2022-01-14 ENCOUNTER — Ambulatory Visit
Admission: RE | Admit: 2022-01-14 | Discharge: 2022-01-14 | Disposition: A | Discharge status: Home / Self Care | Payer: Medicare Other | Source: Ambulatory Visit | Attending: Vascular Surgery | Admitting: Vascular Surgery

## 2022-01-14 ENCOUNTER — Encounter: Payer: Self-pay | Admitting: Vascular Surgery

## 2022-01-14 ENCOUNTER — Other Ambulatory Visit: Payer: Self-pay

## 2022-01-14 DIAGNOSIS — Y841 Kidney dialysis as the cause of abnormal reaction of the patient, or of later complication, without mention of misadventure at the time of the procedure: Secondary | ICD-10-CM | POA: Diagnosis not present

## 2022-01-14 DIAGNOSIS — T82868A Thrombosis of vascular prosthetic devices, implants and grafts, initial encounter: Secondary | ICD-10-CM

## 2022-01-14 DIAGNOSIS — N186 End stage renal disease: Secondary | ICD-10-CM | POA: Diagnosis not present

## 2022-01-14 DIAGNOSIS — Z992 Dependence on renal dialysis: Secondary | ICD-10-CM | POA: Insufficient documentation

## 2022-01-14 DIAGNOSIS — E1122 Type 2 diabetes mellitus with diabetic chronic kidney disease: Secondary | ICD-10-CM | POA: Diagnosis not present

## 2022-01-14 DIAGNOSIS — I120 Hypertensive chronic kidney disease with stage 5 chronic kidney disease or end stage renal disease: Secondary | ICD-10-CM | POA: Insufficient documentation

## 2022-01-14 DIAGNOSIS — T8249XA Other complication of vascular dialysis catheter, initial encounter: Secondary | ICD-10-CM | POA: Insufficient documentation

## 2022-01-14 HISTORY — PX: DIALYSIS/PERMA CATHETER INSERTION: CATH118288

## 2022-01-14 LAB — GLUCOSE, CAPILLARY
Glucose-Capillary: 96 mg/dL (ref 70–99)
Glucose-Capillary: 101 mg/dL — ABNORMAL HIGH (ref 70–99)

## 2022-01-14 SURGERY — DIALYSIS/PERMA CATHETER INSERTION
Anesthesia: Moderate Sedation

## 2022-01-14 MED ORDER — FAMOTIDINE 20 MG PO TABS
ORAL_TABLET | ORAL | Status: AC
  Filled 2022-01-14: qty 2

## 2022-01-14 MED ORDER — METHYLPREDNISOLONE SODIUM SUCC 125 MG IJ SOLR
125.0000 mg | Freq: Once | INTRAMUSCULAR | Status: DC | PRN
Start: 2022-01-14 — End: 2022-01-14

## 2022-01-14 MED ORDER — MIDAZOLAM HCL 2 MG/2ML IJ SOLN
INTRAMUSCULAR | Status: DC | PRN
  Administered 2022-01-14: 1 mg via INTRAVENOUS

## 2022-01-14 MED ORDER — STERILE WATER FOR INJECTION IJ SOLN
INTRAMUSCULAR | Status: AC
  Filled 2022-01-14: qty 10

## 2022-01-14 MED ORDER — FAMOTIDINE 20 MG PO TABS: 40.0000 mg | ORAL_TABLET | Freq: Once | ORAL | Status: DC | PRN

## 2022-01-14 MED ORDER — FENTANYL CITRATE (PF) 100 MCG/2ML IJ SOLN
INTRAMUSCULAR | Status: DC | PRN
  Administered 2022-01-14: 50 ug via INTRAVENOUS

## 2022-01-14 MED ORDER — VANCOMYCIN HCL IN DEXTROSE 1-5 GM/200ML-% IV SOLN
1000.0000 mg | INTRAVENOUS | Status: AC
  Administered 2022-01-14: 1000 mg via INTRAVENOUS
  Filled 2022-01-14: qty 200

## 2022-01-14 MED ORDER — MIDAZOLAM HCL 2 MG/2ML IJ SOLN
INTRAMUSCULAR | Status: AC
  Filled 2022-01-14: qty 2

## 2022-01-14 MED ORDER — FENTANYL CITRATE (PF) 100 MCG/2ML IJ SOLN
INTRAMUSCULAR | Status: AC
  Filled 2022-01-14: qty 2

## 2022-01-14 MED ORDER — MIDAZOLAM HCL 2 MG/ML PO SYRP
8.0000 mg | ORAL_SOLUTION | Freq: Once | ORAL | Status: DC | PRN
Start: 2022-01-14 — End: 2022-01-14

## 2022-01-14 MED ORDER — METHYLPREDNISOLONE SODIUM SUCC 125 MG IJ SOLR
INTRAMUSCULAR | Status: AC
  Filled 2022-01-14: qty 2

## 2022-01-14 MED ORDER — HYDROMORPHONE HCL 1 MG/ML IJ SOLN: 1.0000 mg | Freq: Once | INTRAMUSCULAR | Status: DC | PRN

## 2022-01-14 MED ORDER — DIPHENHYDRAMINE HCL 50 MG/ML IJ SOLN
INTRAMUSCULAR | Status: AC
  Filled 2022-01-14: qty 1

## 2022-01-14 MED ORDER — ONDANSETRON HCL 4 MG/2ML IJ SOLN: 4.0000 mg | Freq: Four times a day (QID) | INTRAMUSCULAR | Status: DC | PRN

## 2022-01-14 MED ORDER — SODIUM CHLORIDE 0.9 % IV SOLN: INTRAVENOUS | Status: DC

## 2022-01-14 MED ORDER — DIPHENHYDRAMINE HCL 50 MG/ML IJ SOLN: 50.0000 mg | Freq: Once | INTRAMUSCULAR | Status: DC | PRN

## 2022-01-14 SURGICAL SUPPLY — 7 items
BIOPATCH RED 1 DISK 7.0 (GAUZE/BANDAGES/DRESSINGS) IMPLANT
CATH PALINDROME-P 44CM KIT (CATHETERS) ×1
GUIDEWIRE SUPER STIFF .035X180 (WIRE) IMPLANT
KIT CATH CHRNC PALINDROME PRCS (CATHETERS) IMPLANT
PACK ANGIOGRAPHY (CUSTOM PROCEDURE TRAY) IMPLANT
SUT MNCRL AB 4-0 PS2 18 (SUTURE) IMPLANT
SUT PROLENE 0 CT 1 30 (SUTURE) IMPLANT

## 2022-01-14 NOTE — Op Note (Signed)
OPERATIVE NOTE    PRE-OPERATIVE DIAGNOSIS: 1. ESRD 2. Non-functional permcath  POST-OPERATIVE DIAGNOSIS: same as above  PROCEDURE: Fluoroscopic guidance for placement of catheter Placement of a 44 cm tip to cuff tunneled hemodialysis catheter via the right femoral vein and removal of previous catheter  SURGEON: Leotis Pain, MD  ANESTHESIA:  Local with moderate conscious sedation for 16 minutes using 1 mg of Versed and 50 mcg of Fentanyl  ESTIMATED BLOOD LOSS: 3 cc  FINDING(S): none  SPECIMEN(S):  None  INDICATIONS:   Patient is a 76 y.o.female who presents with non-functional dialysis catheter and ESRD.  The patient needs long term dialysis access for their ESRD, and a Permcath is necessary.  Risks and benefits are discussed and informed consent is obtained.    DESCRIPTION: After obtaining full informed written consent, the patient was brought back to the vascular suite. The patient received moderate conscious sedation during a face-to-face encounter with me present throughout the entire procedure and supervising the RN monitoring the vital signs, pulse oximetry, telemetry, and mental status throughout the entire procedure. The patient's existing catheter, right groin and thigh were sterilely prepped and draped in a sterile surgical field was created.  The existing catheter was dissected free from the fibrous sheath securing the cuff with hemostats and blunt dissection.  A wire was placed. The existing catheter was then removed and the wire used to keep venous access. I selected a 44 cm tip to cuff tunneled dialysis catheter.  Using fluoroscopic guidance the catheter tips were parked in the right atrium. The appropriate distal connectors were placed. It withdrew blood well and flushed easily with heparinized saline and a concentrated heparin solution was then placed. It was secured to the chest wall with 2 Prolene sutures. A 4-0 Monocryl pursestring suture was placed around the exit site.  Sterile dressings were placed. The patient tolerated the procedure well and was taken to the recovery room in stable condition.  COMPLICATIONS: None  CONDITION: Stable  Leotis Pain 01/14/2022 1:28 PM   This note was created with Dragon Medical transcription system. Any errors in dictation are purely unintentional.

## 2022-01-14 NOTE — H&P (Signed)
Kelli Nguyen Admission History & Physical  MRN : [id number]  Kelli Nguyen is a 76 y.o. (1945/10/04) female who presents with chief complaint of No chief complaint on file. Kelli Nguyen  History of Present Illness:  I am asked to evaluate the patient by the dialysis center. The patient was sent here because they were unable to achieve adequate dialysis today with a catheter with a cuff exposed. The patient is unaware of any other change.   Patient denies pain or tenderness overlying the access.  There is no pain with dialysis.  Patient denies fevers or shaking chills while on dialysis.    There have multiple any past interventions and declots of his multiple different access.  The patient is not chronically hypotensive on dialysis.   Current Facility-Administered Medications  Medication Dose Route Frequency Provider Last Rate Last Admin   0.9 %  sodium chloride infusion   Intravenous Continuous Kris Hartmann, NP       diphenhydrAMINE (BENADRYL) 50 MG/ML injection            diphenhydrAMINE (BENADRYL) injection 50 mg  50 mg Intravenous Once PRN Kris Hartmann, NP       famotidine (PEPCID) 20 MG tablet            famotidine (PEPCID) tablet 40 mg  40 mg Oral Once PRN Kris Hartmann, NP       HYDROmorphone (DILAUDID) injection 1 mg  1 mg Intravenous Once PRN Kris Hartmann, NP       methylPREDNISolone sodium succinate (SOLU-MEDROL) 125 mg/2 mL injection 125 mg  125 mg Intravenous Once PRN Kris Hartmann, NP       methylPREDNISolone sodium succinate (SOLU-MEDROL) 125 mg/2 mL injection            midazolam (VERSED) 2 MG/ML syrup 8 mg  8 mg Oral Once PRN Kris Hartmann, NP       ondansetron [facility]) injection 4 mg  4 mg Intravenous Q6H PRN Kris Hartmann, NP       sterile water (preservative free) injection            vancomycin (VANCOCIN) IVPB 1000 mg/200 mL premix  1,000 mg Intravenous 60 min Pre-Op Kris Hartmann, NP       Past Medical  History ESRD DM HTN Anemia    Past Surgical History:  Procedure Laterality Date   A/V SHUNT INTERVENTION N/A 10/16/2016   Procedure: A/V Shunt Intervention;  Surgeon: Katha Cabal, MD;  Location: [place] [facility];  Service: Cardiovascular;  Laterality: N/A;   A/V SHUNTOGRAM Left 10/16/2016   Procedure: A/V Shuntogram;  Surgeon: Katha Cabal, MD;  Location: [place] [facility];  Service: Cardiovascular;  Laterality: Left;   ABDOMINAL HYSTERECTOMY     CARPAL TUNNEL RELEASE Left 09/27/2015   Procedure: CARPAL TUNNEL RELEASE;  Surgeon: Leanor Kail, MD;  Location: [facility];  Service: Orthopedics;  Laterality: Left;   CATARACT EXTRACTION W/PHACO Right 09/23/2016   Procedure: CATARACT EXTRACTION PHACO AND INTRAOCULAR LENS PLACEMENT (IOC) complicated diabetic right;  Surgeon: Ronnell Freshwater, MD;  Location: [place];  Service: Ophthalmology;  Laterality: Right;  Diabetic - diet controlled Potassium draw before surgery   CATARACT EXTRACTION W/PHACO Left 01/20/2017   Procedure: CATARACT EXTRACTION PHACO AND INTRAOCULAR LENS PLACEMENT ([place]) LEFT DIABETIC;  Surgeon: Eulogio Bear, MD;  Location: [place];  Service: Ophthalmology;  Laterality: Left;  Diabetic - diet controlled Potassium draw prior to procedure  8:00 ARRIVAL   COLONOSCOPY WITH PROPOFOL N/A 11/28/2014   Procedure: COLONOSCOPY WITH PROPOFOL;  Surgeon: Manya Silvas, MD;  Location: [facility];  Service: Endoscopy;  Laterality: N/A;   CORONARY ARTERY BYPASS GRAFT N/A 2008   Procedure: 3v CORONARY ARTERY BYPASS GRAFT (LIMA-LAD, SVG-OM1, SVG-PDA); Location: [facility]; Surgeon: Jannett Celestine, MD   DIALYSIS/PERMA CATHETER INSERTION Left 01/21/2019   Procedure: DIALYSIS/PERMA CATHETER EXCHANGE;  Surgeon: Algernon Huxley, MD;  Location: [place] [facility];  Service: Cardiovascular;  Laterality: Left;   DIALYSIS/PERMA CATHETER INSERTION N/A 08/06/2021   Procedure: DIALYSIS/PERMA CATHETER  INSERTION;  Surgeon: Algernon Huxley, MD;  Location: [place] [facility];  Service: Cardiovascular;  Laterality: N/A;   DIALYSIS/PERMA CATHETER INSERTION N/A 09/27/2021   Procedure: DIALYSIS/PERMA CATHETER INSERTION;  Surgeon: Algernon Huxley, MD;  Location: [place] [facility];  Service: Cardiovascular;  Laterality: N/A;   DIALYSIS/PERMA CATHETER INSERTION N/A 11/02/2021   Procedure: DIALYSIS/PERMA CATHETER INSERTION;  Surgeon: Algernon Huxley, MD;  Location: [place] [facility];  Service: Cardiovascular;  Laterality: N/A;   DIALYSIS/PERMA CATHETER REMOVAL N/A 03/12/2021   Procedure: DIALYSIS/PERMA CATHETER REMOVAL;  Surgeon: Algernon Huxley, MD;  Location: [place] [facility];  Service: Cardiovascular;  Laterality: N/A;   HEMIARTHROPLASTY HIP Left 2018   INSERTION OF DIALYSIS CATHETER Left 01/19/2019   Procedure: INSERTION OF DIALYSIS CATHETER;  Surgeon: Katha Cabal, MD;  Location: [facility];  Service: Vascular;  Laterality: Left;   LEFT HEART CATH AND CORS/GRAFTS ANGIOGRAPHY N/A 02/26/2014   Procedure: LEFT HEART CATH AND CORS/GRAFTS ANGIOGRAPHY; Location: [facility]; Surgeon: Trula Slade, MD   LIGATIONS OF HERO GRAFT Left 01/19/2019   Procedure: EXCISION LEFT HERO GRAFT;  Surgeon: Katha Cabal, MD;  Location: [facility];  Service: Vascular;  Laterality: Left;   NEPHRECTOMY Left    PERIPHERAL VASCULAR CATHETERIZATION Left 01/18/2015   Procedure: A/V Shuntogram/Fistulagram;  Surgeon: Katha Cabal, MD;  Location: [place] [facility];  Service: Cardiovascular;  Laterality: Left;   PERIPHERAL VASCULAR CATHETERIZATION Left 01/18/2015   Procedure: A/V Shunt Intervention;  Surgeon: Katha Cabal, MD;  Location: [place] [facility];  Service: Cardiovascular;  Laterality: Left;   PERIPHERAL VASCULAR CATHETERIZATION N/A 08/10/2015   Procedure: Thrombectomy;  Surgeon: Algernon Huxley, MD;  Location: [place] [facility];  Service: Cardiovascular;  Laterality: N/A;   PERIPHERAL VASCULAR CATHETERIZATION  N/A 08/10/2015   Procedure: A/V Shuntogram/Fistulagram;  Surgeon: Algernon Huxley, MD;  Location: [place] [facility];  Service: Cardiovascular;  Laterality: N/A;   PERIPHERAL VASCULAR CATHETERIZATION N/A 08/10/2015   Procedure: A/V Shunt Intervention;  Surgeon: Algernon Huxley, MD;  Location: [place] [facility];  Service: Cardiovascular;  Laterality: N/A;   PERIPHERAL VASCULAR THROMBECTOMY Left 01/12/2019   Procedure: PERIPHERAL VASCULAR THROMBECTOMY;  Surgeon: Katha Cabal, MD;  Location: [place] [facility];  Service: Cardiovascular;  Laterality: Left;   REMOVAL OF A DIALYSIS CATHETER Right 01/19/2019   Procedure: REMOVAL OF A DIALYSIS CATHETER;  Surgeon: Katha Cabal, MD;  Location: [facility];  Service: Vascular;  Laterality: Right;   RENAL SHUNTS     UPPER EXTREMITY ANGIOGRAPHY Right 12/20/2020   Procedure: UPPER EXTREMITY ANGIOGRAPHY;  Surgeon: Katha Cabal, MD;  Location: [place] [facility];  Service: Cardiovascular;  Laterality: Right;   UPPER EXTREMITY ANGIOGRAPHY Right 06/22/2021   Procedure: Upper Extremity Angiography;  Surgeon: Katha Cabal, MD;  Location: [place] [facility];  Service: Cardiovascular;  Laterality: Right;   UPPER EXTREMITY VENOGRAPHY Right  03/20/2020   Procedure: UPPER EXTREMITY VENOGRAPHY;  Surgeon: Algernon Huxley, MD;  Location: [place] [facility];  Service: Cardiovascular;  Laterality: Right;   VASCULAR ACCESS DEVICE INSERTION Right 11/24/2020   Procedure: INSERTION OF HERO VASCULAR ACCESS DEVICE (GRAFT);  Surgeon: Katha Cabal, MD;  Location: [facility];  Service: Vascular;  Laterality: Right;     Social History   Tobacco Use   Smoking status: Never   Smokeless tobacco: Never  Vaping Use   Vaping Use: Never used  Substance Use Topics   Alcohol use: No   Drug use: No     Family History  Problem Relation Age of Onset   Hypertension Sister     No family history of bleeding or clotting disorders, autoimmune disease or  porphyria  Allergies  Allergen Reactions   Gadolinium Derivatives Hives    Other reaction(s): Unknown Other reaction(s): Other (See Comments) Other reaction(s): Unknown   Penicillins Hives, Itching and Swelling    Has patient had a PCN reaction causing immediate rash, facial/tongue/throat swelling, SOB or lightheadedness with hypotension: No Has patient had a PCN reaction causing severe rash involving mucus membranes or skin necrosis: No Has patient had a PCN reaction that required hospitalization: No Has patient had a PCN reaction occurring within the last 10 years: No If all of the above answers are "NO", then may proceed with Cephalosporin use.  Penicillin allergy assessment completed on 03/22/20. Patient has received and tolerated cepholosporins incuding cefepime, ceftriaxone.    Codeine Hives   Codeine Sulfate Nausea Only   Contrast Media [Iodinated Contrast Media] Hives   Gadobutrol Hives   Metrizamide Hives   Statins Other (See Comments)    muscle pain     REVIEW OF SYSTEMS (Negative unless checked)  Constitutional: '[]'$ Weight loss  '[]'$ Fever  '[]'$ Chills Cardiac: '[]'$ Chest pain   '[]'$ Chest pressure   '[]'$ Palpitations   '[]'$ Shortness of breath when laying flat   '[]'$ Shortness of breath at rest   '[x]'$ Shortness of breath with exertion. Vascular:  '[]'$ Pain in legs with walking   '[]'$ Pain in legs at rest   '[]'$ Pain in legs when laying flat   '[]'$ Claudication   '[]'$ Pain in feet when walking  '[]'$ Pain in feet at rest  '[]'$ Pain in feet when laying flat   '[]'$ History of DVT   '[]'$ Phlebitis   '[]'$ Swelling in legs   '[]'$ Varicose veins   '[]'$ Non-healing ulcers Pulmonary:   '[]'$ Uses home oxygen   '[]'$ Productive cough   '[]'$ Hemoptysis   '[]'$ Wheeze  '[]'$ COPD   '[]'$ Asthma Neurologic:  '[]'$ Dizziness  '[]'$ Blackouts   '[]'$ Seizures   '[]'$ History of stroke   '[]'$ History of TIA  '[]'$ Aphasia   '[]'$ Temporary blindness   '[]'$ Dysphagia   '[]'$ Weakness or numbness in arms   '[]'$ Weakness or numbness in legs Musculoskeletal:  '[x]'$ Arthritis   '[]'$ Joint swelling   '[]'$ Joint pain    '[]'$ Low back pain Hematologic:  '[]'$ Easy bruising  '[]'$ Easy bleeding   '[]'$ Hypercoagulable state   '[x]'$ Anemic  '[]'$ Hepatitis Gastrointestinal:  '[]'$ Blood in stool   '[]'$ Vomiting blood  '[]'$ Gastroesophageal reflux/heartburn   '[]'$ Difficulty swallowing. Genitourinary:  '[x]'$ Chronic kidney disease   '[]'$ Difficult urination  '[]'$ Frequent urination  '[]'$ Burning with urination   '[]'$ Blood in urine Skin:  '[]'$ Rashes   '[]'$ Ulcers   '[]'$ Wounds Psychological:  '[]'$ History of anxiety   '[]'$  History of major depression.  Physical Examination  Vitals:   01/14/22 1142  BP: 135/66  Pulse: 66  Resp: 12  SpO2: 100%  Weight: 70.3 kg  Height: '4\' 11"'$  (1.499 m)   Body mass index is 31.31  kg/m. Gen: WD/WN, NAD Head: [place]/AT, No temporalis wasting.  Ear/Nose/Throat: Hearing grossly intact, nares w/o erythema or drainage, oropharynx w/o Erythema/Exudate,  Eyes: Conjunctiva clear, sclera non-icteric Neck: Trachea midline.  No JVD.  Pulmonary:  Good air movement, respirations not labored, no use of accessory muscles.  Cardiac: RRR, normal S1, S2. Vascular: right femoral tunneled catheter without tenderness or drainage. Cuff exposed Vessel Right Left  Radial Palpable Palpable   Musculoskeletal: M/S 5/5 throughout.  Extremities without ischemic changes.  No deformity or atrophy.  Neurologic: Sensation grossly intact in extremities.  Symmetrical.  Speech is fluent. Motor exam as listed above. Psychiatric: Judgment intact, Mood & affect appropriate for pt's clinical situation. Dermatologic: No rashes or ulcers noted.  No cellulitis or open wounds.    CBC Lab Results  Component Value Date   WBC 5.7 07/26/2021   HGB 12.9 08/03/2021   HCT 38.0 08/03/2021   MCV 88.2 07/26/2021   PLT 228 07/26/2021    BMET    Component Value Date/Time   NA 137 08/03/2021 0845   NA 135 (L) 03/29/2013 1409   K 5.2 (H) 08/03/2021 0845   K 4.5 03/29/2013 1409   CL 98 08/03/2021 0845   CL 97 (L) 03/29/2013 1409   CO2 31 07/26/2021 1108   CO2 27 03/29/2013  1409   GLUCOSE 98 08/03/2021 0845   GLUCOSE 97 03/29/2013 1409   BUN 41 (H) 08/03/2021 0845   BUN 48 (H) 03/29/2013 1409   CREATININE 6.70 (H) 08/03/2021 0845   CREATININE 12.06 (H) 03/29/2013 1409   CALCIUM 8.4 (L) 07/26/2021 1108   CALCIUM 8.8 03/29/2013 1409   GFRNONAA 13 (L) 07/26/2021 1108   GFRNONAA 3 (L) 03/29/2013 1409   GFRAA 3 (L) 01/21/2019 0504   GFRAA 3 (L) 03/29/2013 1409   CrCl cannot be calculated (Patient's most recent lab result is older than the maximum 21 days allowed.).  COAG Lab Results  Component Value Date   INR 1.1 11/30/2020   INR 1.1 11/25/2020   INR 1.1 11/17/2020    Radiology No results found.  Assessment/Plan 1.  Complication dialysis device with thrombosis AV access:  Patient's right femoral tunneled catheter has an exposed cuff. The patient will undergo exchange of the catheter same venous access using interventional techniques.  The risks and benefits were described to the patient.  All questions were answered.  The patient agrees to proceed with intervention.  2.  End-stage renal disease requiring hemodialysis:  Patient will continue dialysis therapy without further interruption if a successful exchange is not achieved then new site will be found for tunneled catheter placement. Dialysis has already been arranged since the patient missed their previous session 3.  Hypertension:  Patient will continue medical management; nephrology is following no changes in oral medications. 4. Diabetes mellitus:  Glucose will be monitored and oral medications been held this morning once the patient has undergone the patient's procedure po intake will be reinitiated and again Accu-Cheks will be used to assess the blood glucose level and treat as needed. The patient will be restarted on the patient's usual hypoglycemic regime     Leotis Pain, MD  01/14/2022 11:48 AM

## 2022-03-11 ENCOUNTER — Other Ambulatory Visit (INDEPENDENT_AMBULATORY_CARE_PROVIDER_SITE_OTHER): Payer: Self-pay | Admitting: Vascular Surgery

## 2022-03-15 ENCOUNTER — Ambulatory Visit
Admission: EM | Admit: 2022-03-15 | Discharge: 2022-03-15 | Disposition: A | Discharge status: Home / Self Care | Payer: Medicare Other | Attending: Emergency Medicine | Admitting: Emergency Medicine

## 2022-03-15 ENCOUNTER — Other Ambulatory Visit (INDEPENDENT_AMBULATORY_CARE_PROVIDER_SITE_OTHER): Payer: Self-pay | Admitting: Nurse Practitioner

## 2022-03-15 ENCOUNTER — Encounter
Admission: EM | Disposition: A | Discharge status: Home / Self Care | Payer: Self-pay | Source: Home / Self Care | Attending: Emergency Medicine

## 2022-03-15 ENCOUNTER — Other Ambulatory Visit: Payer: Self-pay

## 2022-03-15 DIAGNOSIS — T8241XA Breakdown (mechanical) of vascular dialysis catheter, initial encounter: Secondary | ICD-10-CM | POA: Diagnosis not present

## 2022-03-15 DIAGNOSIS — E785 Hyperlipidemia, unspecified: Secondary | ICD-10-CM | POA: Insufficient documentation

## 2022-03-15 DIAGNOSIS — T82898A Other specified complication of vascular prosthetic devices, implants and grafts, initial encounter: Secondary | ICD-10-CM

## 2022-03-15 DIAGNOSIS — N186 End stage renal disease: Secondary | ICD-10-CM | POA: Diagnosis not present

## 2022-03-15 DIAGNOSIS — I120 Hypertensive chronic kidney disease with stage 5 chronic kidney disease or end stage renal disease: Secondary | ICD-10-CM | POA: Diagnosis not present

## 2022-03-15 DIAGNOSIS — T8249XA Other complication of vascular dialysis catheter, initial encounter: Secondary | ICD-10-CM | POA: Insufficient documentation

## 2022-03-15 DIAGNOSIS — E1122 Type 2 diabetes mellitus with diabetic chronic kidney disease: Secondary | ICD-10-CM | POA: Diagnosis not present

## 2022-03-15 DIAGNOSIS — Y848 Other medical procedures as the cause of abnormal reaction of the patient, or of later complication, without mention of misadventure at the time of the procedure: Secondary | ICD-10-CM | POA: Diagnosis not present

## 2022-03-15 DIAGNOSIS — Z992 Dependence on renal dialysis: Secondary | ICD-10-CM | POA: Diagnosis not present

## 2022-03-15 HISTORY — PX: DIALYSIS/PERMA CATHETER INSERTION: CATH118288

## 2022-03-15 LAB — CBC
HCT: 29.6 % — ABNORMAL LOW (ref 36.0–46.0)
Hemoglobin: 9 g/dL — ABNORMAL LOW (ref 12.0–15.0)
MCH: 24.9 pg — ABNORMAL LOW (ref 26.0–34.0)
MCHC: 30.4 g/dL (ref 30.0–36.0)
MCV: 81.8 fL (ref 80.0–100.0)
Platelets: 201 10*3/uL (ref 150–400)
RBC: 3.62 MIL/uL — ABNORMAL LOW (ref 3.87–5.11)
RDW: 17.1 % — ABNORMAL HIGH (ref 11.5–15.5)
WBC: 6.9 10*3/uL (ref 4.0–10.5)
nRBC: 0 % (ref 0.0–0.2)

## 2022-03-15 LAB — GLUCOSE, CAPILLARY: Glucose-Capillary: 118 mg/dL — ABNORMAL HIGH (ref 70–99)

## 2022-03-15 LAB — BASIC METABOLIC PANEL
Anion gap: 17 — ABNORMAL HIGH (ref 5–15)
BUN: 68 mg/dL — ABNORMAL HIGH (ref 8–23)
CO2: 24 mmol/L (ref 22–32)
Calcium: 8.2 mg/dL — ABNORMAL LOW (ref 8.9–10.3)
Chloride: 100 mmol/L (ref 98–111)
Creatinine, Ser: 9.54 mg/dL — ABNORMAL HIGH (ref 0.44–1.00)
GFR, Estimated: 4 mL/min — ABNORMAL LOW (ref >60)
Glucose, Bld: 90 mg/dL (ref 70–99)
Potassium: 4.6 mmol/L (ref 3.5–5.1)
Sodium: 141 mmol/L (ref 135–145)

## 2022-03-15 SURGERY — DIALYSIS/PERMA CATHETER INSERTION
Anesthesia: Choice

## 2022-03-15 MED ORDER — VANCOMYCIN HCL IN DEXTROSE 1-5 GM/200ML-% IV SOLN: 1000.0000 mg | INTRAVENOUS | Status: DC

## 2022-03-15 MED ORDER — MIDAZOLAM HCL 2 MG/ML PO SYRP: 8.0000 mg | ORAL_SOLUTION | Freq: Once | ORAL | Status: DC | PRN

## 2022-03-15 MED ORDER — VANCOMYCIN HCL IN DEXTROSE 1-5 GM/200ML-% IV SOLN
INTRAVENOUS | Status: AC
  Filled 2022-03-15: qty 200

## 2022-03-15 MED ORDER — FENTANYL CITRATE (PF) 100 MCG/2ML IJ SOLN
INTRAMUSCULAR | Status: AC
  Filled 2022-03-15: qty 2

## 2022-03-15 MED ORDER — DIPHENHYDRAMINE HCL 50 MG/ML IJ SOLN: 50.0000 mg | Freq: Once | INTRAMUSCULAR | Status: DC | PRN

## 2022-03-15 MED ORDER — METHYLPREDNISOLONE SODIUM SUCC 125 MG IJ SOLR: 125.0000 mg | Freq: Once | INTRAMUSCULAR | Status: DC | PRN

## 2022-03-15 MED ORDER — MIDAZOLAM HCL 5 MG/5ML IJ SOLN
INTRAMUSCULAR | Status: AC
  Filled 2022-03-15: qty 5

## 2022-03-15 MED ORDER — HYDROMORPHONE HCL 1 MG/ML IJ SOLN: 1.0000 mg | Freq: Once | INTRAMUSCULAR | Status: DC | PRN

## 2022-03-15 MED ORDER — HEPARIN SODIUM (PORCINE) 1000 UNIT/ML IJ SOLN
INTRAMUSCULAR | Status: AC
  Filled 2022-03-15: qty 10

## 2022-03-15 MED ORDER — ONDANSETRON HCL 4 MG/2ML IJ SOLN: 4.0000 mg | Freq: Four times a day (QID) | INTRAMUSCULAR | Status: DC | PRN

## 2022-03-15 MED ORDER — VANCOMYCIN HCL IN DEXTROSE 1-5 GM/200ML-% IV SOLN
INTRAVENOUS | Status: DC | PRN
  Administered 2022-03-15: 1000 mg via INTRAVENOUS

## 2022-03-15 MED ORDER — MIDAZOLAM HCL 2 MG/2ML IJ SOLN
INTRAMUSCULAR | Status: DC | PRN
  Administered 2022-03-15: 1 mg via INTRAVENOUS

## 2022-03-15 MED ORDER — FENTANYL CITRATE (PF) 100 MCG/2ML IJ SOLN
INTRAMUSCULAR | Status: DC | PRN
  Administered 2022-03-15: 50 ug via INTRAVENOUS

## 2022-03-15 MED ORDER — HEPARIN SODIUM (PORCINE) 10000 UNIT/ML IJ SOLN
INTRAMUSCULAR | Status: AC
  Filled 2022-03-15: qty 1

## 2022-03-15 MED ORDER — SODIUM CHLORIDE 0.9 % IV SOLN: INTRAVENOUS | Status: DC

## 2022-03-15 MED ORDER — FAMOTIDINE 20 MG PO TABS: 40.0000 mg | ORAL_TABLET | Freq: Once | ORAL | Status: DC | PRN

## 2022-03-15 SURGICAL SUPPLY — 7 items
BIOPATCH RED 1 DISK 7.0 (GAUZE/BANDAGES/DRESSINGS) IMPLANT
CATH PALINDROME-P 44CM KIT (CATHETERS) ×1
GUIDEWIRE SUPER STIFF .035X180 (WIRE) IMPLANT
KIT CATH CHRNC PALINDROME PRCS (CATHETERS) IMPLANT
PACK ANGIOGRAPHY (CUSTOM PROCEDURE TRAY) IMPLANT
SUT MNCRL AB 4-0 PS2 18 (SUTURE) IMPLANT
SUT PROLENE 0 CT 1 30 (SUTURE) IMPLANT

## 2022-03-15 NOTE — Discharge Instructions (Signed)
Tunneled Catheter Insertion, Care After The following information offers guidance on how to care for yourself after your procedure. Your health care provider may also give you more specific instructions. If you have problems or questions, contact your health care provider. What can I expect after the procedure? After the procedure, it is common to have: Some mild redness, bruising, swelling, and pain around your catheter site. A small amount of blood or clear fluid coming from your incisions. Follow these instructions at home: Medicines Take over-the-counter and prescription medicines only as told by your health care provider. If you were prescribed an antibiotic medicine, take it as told by your health care provider. Do not stop taking the antibiotic even if you start to feel better. Incision care  Follow instructions from your health care provider about how to take care of your incisions. Make sure you: Your dialysis center will perform all needed dressing changes.  Leave stitches (sutures), skin glue, or adhesive strips in place. These skin closures may need to stay in place for 2 weeks or longer. Keep your dressings clean and dry. Check your incision areas every day for signs of infection. Check for: More redness, swelling, or pain. More fluid or blood. Warmth. Pus or a bad smell. Catheter care  Keep your catheter site clean and dry. Do not shower, sponge bath only while catheter in place.  Your dialysis center will Flush your catheter per the dialysis center protocol.  This helps prevent it from becoming clogged. Leave thecaps on the ends of the catheter when not in use. Do not pull on your catheter. Activity Return to your normal activities as told by your health care provider. Ask your health care provider what activities are safe for you. Follow any other activity restrictions as instructed by your health care provider. Do not lift anything that is heavier than 10 lb (4.5 kg), or  the limit that you are told, until your health care provider says that it is safe. Driving Do not drive for 24 hours.  General instructions Follow your health care provider's specific instructions for the type of catheter that you have. Do not take baths, swim, or use a hot tub while your catheter is in place. Keep all follow-up visits. This is important to prevent infection.  Contact a health care provider if: You feel unusually weak or nauseous. You have a fever or chills. You have more redness, swelling, or pain at your incisions or around the area where your catheter has been inserted or where it exits. You have pus or a bad smell coming from your catheter site. Your catheter site feels warm to the touch. Your catheter is not working properly. Fluid is leaking from the catheter, under the dressing, or around the dressing. Your dialysis center is unable to flush your catheter. Get help right away if: Your catheter develops a hole or it breaks. Your catheter comes loose or gets pulled completely out. If this happens, press on your catheter site firmly with a clean cloth until you can get medical help. You develop bleeding from your catheter or your insertion site, and your bleeding does not stop. You have swelling in your shoulder, neck, chest, or face. You have pain or swelling when fluids or medicines are being given through the catheter. You have chest pain or difficulty breathing. These symptoms may represent a serious problem that is an emergency. Do not wait to see if the symptoms will go away. Get medical help right away. Call your  local emergency services (911 in the [place] Do not drive yourself to the hospital. Summary After the procedure, it is common to have mild redness, swelling, and pain around your catheter site. Return to your normal activities as told by your health care provider. Ask your health care provider what activities are safe for you. Follow your health care  provider's specific instructions for the type of catheter that you have. Keep your catheter site and your dressings clean and dry. Contact a health care provider if your catheter is not working properly. Get help right away if you have chest pain, difficulty breathing, or your catheter comes loose or gets pulled completely out. This information is not intended to replace advice given to you by your health care provider. Make sure you discuss any questions you have with your health care provider. Document Revised: 08/28/2020 Document Reviewed: 08/28/2020 Elsevier Patient Education  [place].

## 2022-03-15 NOTE — Op Note (Signed)
OPERATIVE NOTE    PRE-OPERATIVE DIAGNOSIS: 1. ESRD 2. Non-functional permcath  POST-OPERATIVE DIAGNOSIS: same as above  PROCEDURE: Fluoroscopic guidance for placement of catheter Placement of a 44 cm tip to cuff tunneled hemodialysis catheter via the right femoral vein and removal of previous catheter  SURGEON: Leotis Pain, MD  ANESTHESIA:  Local with moderate conscious sedation for 15 minutes using 1 mg of Versed and 50 mcg of Fentanyl  ESTIMATED BLOOD LOSS: 5 cc  FINDING(S): none  SPECIMEN(S):  None  INDICATIONS:   Patient is a 76 y.o.female who presents with non-functional dialysis catheter and ESRD.  The patient needs long term dialysis access for their ESRD, and a Permcath is necessary.  Risks and benefits are discussed and informed consent is obtained.    DESCRIPTION: After obtaining full informed written consent, the patient was brought back to the vascular suite. The patient received moderate conscious sedation during a face-to-face encounter with me present throughout the entire procedure and supervising the RN monitoring the vital signs, pulse oximetry, telemetry, and mental status throughout the entire procedure. The patient's existing catheter, right groin and thigh were sterilely prepped and draped in a sterile surgical field was created.  The existing catheter was dissected free from the fibrous sheath securing the cuff with hemostats and blunt dissection.  A wire was placed. The existing catheter was then removed and the wire used to keep venous access. I selected a 44 cm tip to cuff tunneled dialysis catheter.  Using fluoroscopic guidance the catheter tips were parked in the right atrium. The appropriate distal connectors were placed. It withdrew blood well and flushed easily with heparinized saline and a concentrated heparin solution was then placed. It was secured to the chest wall with 2 Prolene sutures. A 4-0 Monocryl pursestring suture was placed around the exit site.  Sterile dressings were placed. The patient tolerated the procedure well and was taken to the recovery room in stable condition.  COMPLICATIONS: None  CONDITION: Stable  Leotis Pain 03/15/2022 1:10 PM   This note was created with Dragon Medical transcription system. Any errors in dictation are purely unintentional.

## 2022-03-15 NOTE — Consult Note (Signed)
Kelli Nguyen  MRN : [id number]  Kelli Nguyen is a 76 y.o. (10/03/1945) female who presents with chief complaint of  Chief Complaint  Patient presents with   Vascular Access Problem  .   Consulting Physician:Kevin Paduchowski, MD Reason for consult: Displaced PermCath History of Present Illness: Kelli Nguyen is a 76 year old female who presented to [facility] due to a displaced femoral PermCath.  The patient notes that she has not been able to dialyze since Tuesday.  She denies any confusion or uremic symptoms.  She denies any chest pain or shortness of breath.  The patient has unfortunately exhausted her bilateral upper extremity access.  She is well-known to our practice.  We have previously discussed with the patient the placement of lower extremity graft however given her history of steal symptoms as well as her recent noninvasive vascular studies, there is concern that she may develop steal syndrome if we were to place a thigh graft.  The patient was recommended to undergo angiogram of her lower extremities to ensure there would be no difficulties with graft placement.  Following her last visit with [place], the patient did not wish to move forward but wished to consider her options.  No current facility-administered medications for this encounter.   Current Outpatient Medications  Medication Sig Dispense Refill   aspirin 81 MG tablet Take 81 mg by mouth daily.     cephALEXin (KEFLEX) 500 MG capsule Take 500 mg by mouth 2 (two) times daily.     docusate sodium (COLACE) 100 MG capsule Take 100 mg by mouth daily.     latanoprost (XALATAN) 0.005 % ophthalmic solution Place 1 drop into both eyes at bedtime.     pantoprazole (PROTONIX) 40 MG tablet Take 40 mg by mouth daily.     pravastatin (PRAVACHOL) 20 MG tablet Take 20 mg by mouth daily.     sevelamer carbonate (RENVELA) 800 MG tablet Take 1,600 mg by mouth 3 (three) times  daily with meals.     timolol (TIMOPTIC) 0.5 % ophthalmic solution Place 1 drop into both eyes daily.     acetaminophen (TYLENOL) 500 MG tablet Take 500 mg by mouth every 6 (six) hours as needed for moderate pain or headache.     cinacalcet (SENSIPAR) 30 MG tablet Take 30 mg by mouth Every Tuesday,Thursday,and Saturday with dialysis.     clopidogrel (PLAVIX) 75 MG tablet TAKE 1 TABLET BY MOUTH EVERY DAY 90 tablet 1   diclofenac Sodium (VOLTAREN) 1 % GEL Apply topically. (Patient not taking: Reported on 01/14/2022)     Methoxy PEG-Epoetin Beta (MIRCERA IJ) Mircera     midodrine (PROAMATINE) 5 MG tablet Take 1 tablet (5 mg total) by mouth Every Tuesday,Thursday,and Saturday with dialysis. Please give 30 minutes prior to dialysis. Do NOT hold for dialysis. GIVE DOSE DURING DAYLIGHT HOURS ONLY     polyethylene glycol powder (GLYCOLAX/MIRALAX) 17 GM/SCOOP powder Take 17 g every day by oral route. (Patient not taking: Reported on 03/15/2022)     ZYRTEC ALLERGY 10 MG tablet Take 10 mg by mouth daily.      Past Medical History:  Diagnosis Date   Anemia    Carpal tunnel syndrome of left wrist    Cataract, bilateral    Chronic cough    Closed displaced fracture of left femoral neck ([place]) 03/09/2017   Coronary artery disease    a.) s/p 3v CABG (LIMA-LAD, SVG-OM1, SVG-PDA) in 2008. b.) LHC 02/26/2014:  70% dLM. 80% pLAD, 70-80% mLCx, chronic CTO in the RCA; SVG-OM1 and SVG-PDA patent; 80-90% LIMA-LAD at the anastamosis and 80-90% at dLAD after anastamosis; very tortous LEFT subclavian aftery LIMA takeoff (difficult engagement of LIMA via femoral approach).   Diabetes mellitus without complication (HCC)    Diastolic dysfunction    a.) TTE 09/20/2020: EF 55-60%; low normal RVSF; RVSP elevated at 40.7 mmHg; mild TR; G2DD.   ESRD (end stage renal disease) on dialysis ([place])    a.) T-Th-Sat   First degree AV block    GERD (gastroesophageal reflux disease)    Glaucoma    HLD (hyperlipidemia)    Hypertension     Murmur    OA (osteoarthritis)    PAD (peripheral artery disease) (HCC)    Pneumonia    Polyneuropathy    feet and hands   Pulmonary hypertension (HCC)    RBBB    Secondary hyperparathyroidism of renal origin ([place])    Sepsis ([place]) 06/28/2017   Wears dentures    partial upper and lower    Past Surgical History:  Procedure Laterality Date   A/V SHUNT INTERVENTION N/A 10/16/2016   Procedure: A/V Shunt Intervention;  Surgeon: Katha Cabal, MD;  Location: [place] [facility];  Service: Cardiovascular;  Laterality: N/A;   A/V SHUNTOGRAM Left 10/16/2016   Procedure: A/V Shuntogram;  Surgeon: Katha Cabal, MD;  Location: [place] [facility];  Service: Cardiovascular;  Laterality: Left;   ABDOMINAL HYSTERECTOMY     CARPAL TUNNEL RELEASE Left 09/27/2015   Procedure: CARPAL TUNNEL RELEASE;  Surgeon: Leanor Kail, MD;  Location: [facility];  Service: Orthopedics;  Laterality: Left;   CATARACT EXTRACTION W/PHACO Right 09/23/2016   Procedure: CATARACT EXTRACTION PHACO AND INTRAOCULAR LENS PLACEMENT (I[facility]) complicated diabetic right;  Surgeon: Ronnell Freshwater, MD;  Location: [place];  Service: Ophthalmology;  Laterality: Right;  Diabetic - diet controlled Potassium draw before surgery   CATARACT EXTRACTION W/PHACO Left 01/20/2017   Procedure: CATARACT EXTRACTION PHACO AND INTRAOCULAR LENS PLACEMENT ([place]) LEFT DIABETIC;  Surgeon: Eulogio Bear, MD;  Location: [place];  Service: Ophthalmology;  Laterality: Left;  Diabetic - diet controlled Potassium draw prior to procedure  8:00 ARRIVAL   COLONOSCOPY WITH PROPOFOL N/A 11/28/2014   Procedure: COLONOSCOPY WITH PROPOFOL;  Surgeon: Manya Silvas, MD;  Location: [facility];  Service: Endoscopy;  Laterality: N/A;   CORONARY ARTERY BYPASS GRAFT N/A 2008   Procedure: 3v CORONARY ARTERY BYPASS GRAFT (LIMA-LAD, SVG-OM1, SVG-PDA); Location: [facility]; Surgeon: Jannett Celestine, MD   DIALYSIS/PERMA CATHETER  INSERTION Left 01/21/2019   Procedure: DIALYSIS/PERMA CATHETER EXCHANGE;  Surgeon: Algernon Huxley, MD;  Location: [place] [facility];  Service: Cardiovascular;  Laterality: Left;   DIALYSIS/PERMA CATHETER INSERTION N/A 08/06/2021   Procedure: DIALYSIS/PERMA CATHETER INSERTION;  Surgeon: Algernon Huxley, MD;  Location: [place] [facility];  Service: Cardiovascular;  Laterality: N/A;   DIALYSIS/PERMA CATHETER INSERTION N/A 09/27/2021   Procedure: DIALYSIS/PERMA CATHETER INSERTION;  Surgeon: Algernon Huxley, MD;  Location: [place] [facility];  Service: Cardiovascular;  Laterality: N/A;   DIALYSIS/PERMA CATHETER INSERTION N/A 11/02/2021   Procedure: DIALYSIS/PERMA CATHETER INSERTION;  Surgeon: Algernon Huxley, MD;  Location: [place] [facility];  Service: Cardiovascular;  Laterality: N/A;   DIALYSIS/PERMA CATHETER INSERTION N/A 01/14/2022   Procedure: DIALYSIS/PERMA CATHETER INSERTION;  Surgeon: Algernon Huxley, MD;  Location: [place] [facility];  Service: Cardiovascular;  Laterality: N/A;   DIALYSIS/PERMA CATHETER REMOVAL N/A 03/12/2021   Procedure:  DIALYSIS/PERMA CATHETER REMOVAL;  Surgeon: Algernon Huxley, MD;  Location: [place] [facility];  Service: Cardiovascular;  Laterality: N/A;   HEMIARTHROPLASTY HIP Left 2018   INSERTION OF DIALYSIS CATHETER Left 01/19/2019   Procedure: INSERTION OF DIALYSIS CATHETER;  Surgeon: Katha Cabal, MD;  Location: [facility];  Service: Vascular;  Laterality: Left;   LEFT HEART CATH AND CORS/GRAFTS ANGIOGRAPHY N/A 02/26/2014   Procedure: LEFT HEART CATH AND CORS/GRAFTS ANGIOGRAPHY; Location: [facility]; Surgeon: Trula Slade, MD   LIGATIONS OF HERO GRAFT Left 01/19/2019   Procedure: EXCISION LEFT HERO GRAFT;  Surgeon: Katha Cabal, MD;  Location: [facility];  Service: Vascular;  Laterality: Left;   NEPHRECTOMY Left    PERIPHERAL VASCULAR CATHETERIZATION Left 01/18/2015   Procedure: A/V Shuntogram/Fistulagram;  Surgeon: Katha Cabal, MD;  Location: [place] [facility];   Service: Cardiovascular;  Laterality: Left;   PERIPHERAL VASCULAR CATHETERIZATION Left 01/18/2015   Procedure: A/V Shunt Intervention;  Surgeon: Katha Cabal, MD;  Location: [place] [facility];  Service: Cardiovascular;  Laterality: Left;   PERIPHERAL VASCULAR CATHETERIZATION N/A 08/10/2015   Procedure: Thrombectomy;  Surgeon: Algernon Huxley, MD;  Location: [place] [facility];  Service: Cardiovascular;  Laterality: N/A;   PERIPHERAL VASCULAR CATHETERIZATION N/A 08/10/2015   Procedure: A/V Shuntogram/Fistulagram;  Surgeon: Algernon Huxley, MD;  Location: [place] [facility];  Service: Cardiovascular;  Laterality: N/A;   PERIPHERAL VASCULAR CATHETERIZATION N/A 08/10/2015   Procedure: A/V Shunt Intervention;  Surgeon: Algernon Huxley, MD;  Location: [place] [facility];  Service: Cardiovascular;  Laterality: N/A;   PERIPHERAL VASCULAR THROMBECTOMY Left 01/12/2019   Procedure: PERIPHERAL VASCULAR THROMBECTOMY;  Surgeon: Katha Cabal, MD;  Location: [place] [facility];  Service: Cardiovascular;  Laterality: Left;   REMOVAL OF A DIALYSIS CATHETER Right 01/19/2019   Procedure: REMOVAL OF A DIALYSIS CATHETER;  Surgeon: Katha Cabal, MD;  Location: [facility];  Service: Vascular;  Laterality: Right;   RENAL SHUNTS     UPPER EXTREMITY ANGIOGRAPHY Right 12/20/2020   Procedure: UPPER EXTREMITY ANGIOGRAPHY;  Surgeon: Katha Cabal, MD;  Location: [place] [facility];  Service: Cardiovascular;  Laterality: Right;   UPPER EXTREMITY ANGIOGRAPHY Right 06/22/2021   Procedure: Upper Extremity Angiography;  Surgeon: Katha Cabal, MD;  Location: [place] [facility];  Service: Cardiovascular;  Laterality: Right;   UPPER EXTREMITY VENOGRAPHY Right 03/20/2020   Procedure: UPPER EXTREMITY VENOGRAPHY;  Surgeon: Algernon Huxley, MD;  Location: [place] [facility];  Service: Cardiovascular;  Laterality: Right;   VASCULAR ACCESS DEVICE INSERTION Right 11/24/2020   Procedure: INSERTION OF HERO VASCULAR  ACCESS DEVICE (GRAFT);  Surgeon: Katha Cabal, MD;  Location: [facility];  Service: Vascular;  Laterality: Right;    Social History Social History   Tobacco Use   Smoking status: Never   Smokeless tobacco: Never  Vaping Use   Vaping Use: Never used  Substance Use Topics   Alcohol use: No   Drug use: No    Family History Family History  Problem Relation Age of Onset   Hypertension Sister     Allergies  Allergen Reactions   Gadolinium Derivatives Hives    Other reaction(s): Unknown Other reaction(s): Other (See Comments) Other reaction(s): Unknown   Penicillins Hives, Itching and Swelling    Has patient had a PCN reaction causing immediate rash, facial/tongue/throat swelling, SOB or lightheadedness with hypotension: No Has patient had a PCN reaction causing severe rash involving mucus membranes or skin necrosis: No  Has patient had a PCN reaction that required hospitalization: No Has patient had a PCN reaction occurring within the last 10 years: No If all of the above answers are "NO", then may proceed with Cephalosporin use.  Penicillin allergy assessment completed on 03/22/20. Patient has received and tolerated cepholosporins incuding cefepime, ceftriaxone.    Codeine Hives   Codeine Sulfate Nausea Only   Contrast Media [Iodinated Contrast Media] Hives   Gadobutrol Hives   Metrizamide Hives   Statins Other (See Comments)    muscle pain     REVIEW OF SYSTEMS (Negative unless checked)  Constitutional: '[]'$ Weight loss  '[]'$ Fever  '[]'$ Chills Cardiac: '[]'$ Chest pain   '[]'$ Chest pressure   '[]'$ Palpitations   '[]'$ Shortness of breath when laying flat   '[]'$ Shortness of breath at rest   '[]'$ Shortness of breath with exertion. Vascular:  '[]'$ Pain in legs with walking   '[]'$ Pain in legs at rest   '[]'$ Pain in legs when laying flat   '[]'$ Claudication   '[]'$ Pain in feet when walking  '[]'$ Pain in feet at rest  '[]'$ Pain in feet when laying flat   '[]'$ History of DVT   '[]'$ Phlebitis   '[]'$ Swelling in legs   '[]'$ Varicose  veins   '[]'$ Non-healing ulcers Pulmonary:   '[]'$ Uses home oxygen   '[]'$ Productive cough   '[]'$ Hemoptysis   '[]'$ Wheeze  '[]'$ COPD   '[]'$ Asthma Neurologic:  '[]'$ Dizziness  '[]'$ Blackouts   '[]'$ Seizures   '[]'$ History of stroke   '[]'$ History of TIA  '[]'$ Aphasia   '[]'$ Temporary blindness   '[]'$ Dysphagia   '[]'$ Weakness or numbness in arms   '[]'$ Weakness or numbness in legs Musculoskeletal:  '[]'$ Arthritis   '[]'$ Joint swelling   '[]'$ Joint pain   '[]'$ Low back pain Hematologic:  '[]'$ Easy bruising  '[]'$ Easy bleeding   '[]'$ Hypercoagulable state   '[]'$ Anemic  '[]'$ Hepatitis Gastrointestinal:  '[]'$ Blood in stool   '[]'$ Vomiting blood  '[]'$ Gastroesophageal reflux/heartburn   '[]'$ Difficulty swallowing. Genitourinary:  '[x]'$ Chronic kidney disease   '[]'$ Difficult urination  '[]'$ Frequent urination  '[]'$ Burning with urination   '[]'$ Blood in urine Skin:  '[]'$ Rashes   '[]'$ Ulcers   '[]'$ Wounds Psychological:  '[]'$ History of anxiety   '[]'$  History of major depression.  Physical Examination  Vitals:   03/15/22 1005 03/15/22 1006  BP:  (!) 157/45  Pulse: 74   Resp: 16   Temp: 97.8 F (36.6 C)   TempSrc: Oral   SpO2: 96%   Weight: 70.3 kg   Height: '4\' 11"'$  (1.499 m)    Body mass index is 31.3 kg/m. Gen:  WD/WN, NAD Head: [place]/AT, No temporalis wasting. Prominent temp pulse not noted. Ear/Nose/Throat: Hearing grossly intact, nares w/o erythema or drainage, oropharynx w/o Erythema/Exudate Eyes: Sclera non-icteric, conjunctiva clear Neck: Trachea midline.  No JVD.  Pulmonary:  Good air movement, respirations not labored, equal bilaterally.  Cardiac: RRR, normal S1, S2. Vascular: Displaced PermCath Vessel Right Left  Radial Palpable Palpable   Gastrointestinal: soft, non-tender/non-distended. No guarding/reflex.  Musculoskeletal: M/S 5/5 throughout.  Extremities without ischemic changes.  No deformity or atrophy. No edema. Neurologic: Sensation grossly intact in extremities.  Symmetrical.  Speech is fluent. Motor exam as listed above. Psychiatric: Judgment intact, Mood & affect appropriate for  pt's clinical situation. Dermatologic: No rashes or ulcers noted.  No cellulitis or open wounds. Lymph : No Cervical, Axillary, or Inguinal lymphadenopathy.    CBC Lab Results  Component Value Date   WBC 5.7 07/26/2021   HGB 12.9 08/03/2021   HCT 38.0 08/03/2021   MCV 88.2 07/26/2021   PLT 228 07/26/2021    BMET    Component Value  Date/Time   NA 137 08/03/2021 0845   NA 135 (L) 03/29/2013 1409   K 5.2 (H) 08/03/2021 0845   K 4.5 03/29/2013 1409   CL 98 08/03/2021 0845   CL 97 (L) 03/29/2013 1409   CO2 31 07/26/2021 1108   CO2 27 03/29/2013 1409   GLUCOSE 98 08/03/2021 0845   GLUCOSE 97 03/29/2013 1409   BUN 41 (H) 08/03/2021 0845   BUN 48 (H) 03/29/2013 1409   CREATININE 6.70 (H) 08/03/2021 0845   CREATININE 12.06 (H) 03/29/2013 1409   CALCIUM 8.4 (L) 07/26/2021 1108   CALCIUM 8.8 03/29/2013 1409   GFRNONAA 13 (L) 07/26/2021 1108   GFRNONAA 3 (L) 03/29/2013 1409   GFRAA 3 (L) 01/21/2019 0504   GFRAA 3 (L) 03/29/2013 1409   CrCl cannot be calculated (Patient's most recent lab result is older than the maximum 21 days allowed.).  COAG Lab Results  Component Value Date   INR 1.1 11/30/2020   INR 1.1 11/25/2020   INR 1.1 11/17/2020    Radiology No results found.    Assessment/Plan 1. End Stage Renal Disease  Patient currently has a nonfunctional PermCath.  Based on this it would be in the patient's best interest for permacatheter placement.  I have discussed risk benefits and alternatives and the patient is agreeable to proceed.  Following PermCath placement patient should be able to discharge  We also further discussed about the possibility of permanent dialysis access.  The patient has exhausted her bilateral upper extremity access.  We had previously discussed lower extremity access with the patient in office.  At that time the patient was not ready to move forward.  The patient is still considering this as an option.  We can have the patient return for  follow-up to discuss lower extremity access once again.  Plan of care discussed with Dr. Lucky Cowboy and he is in agreement with plan noted above.   Family Communication: Daughter at bedside  Total Time:55 Minutes I spent 55 minutes in this encounter including personally reviewing extensive medical records, personally reviewing imaging studies and compared to prior scans, counseling the patient, placing orders, coordinating care and performing appropriate documentation  Thank you for allowing [place] to participate in the care of this patient.   Kris Hartmann, NP [facility] [phone] (Office Phone) [phone] (Office Fax) [phone] (Pager)  03/15/2022 11:23 AM  Staff may message me via secure chat in [place]  but this may not receive immediate response,  please page for urgent matters!  Dictation software was used to generate the above Nguyen. Typos may occur and escape review, as with typed/written notes. Any error is purely unintentional.  Please contact me directly for clarity if needed.

## 2022-03-15 NOTE — ED Triage Notes (Signed)
Pt here with a vascular issues. Pt states her permacath in her groin has come out and she was not able to get in touch with her provider. Pt states the last time she was dialyzed was on Tuesday. Pt has 2 inactive shunts in both arms currently.

## 2022-03-15 NOTE — ED Notes (Signed)
Unable to collect blood from pt due to her having shunts in both upper and lower arms. Pt prefers to wait until she gets into a room for blood work.

## 2022-03-15 NOTE — ED Provider Notes (Signed)
   [facility] Provider Note    Event Date/Time   First MD Initiated Contact with Patient 03/15/22 1053     (approximate)  History   Chief Complaint: Vascular Access Problem  HPI  Kelli Nguyen is a 76 y.o. female with a past medical history of ESRD on HD Tuesday/Thursday/Saturday, diabetes, hypertension, hyperlipidemia, presents to the emergency department for vascular access problem.  Patient has a right permacath in her femoral vein.  Patient went to her dialysis center yesterday but they state that the cuff had pulled out of the skin slightly and they referred her to her vascular surgeon for a catheter exchange.  Patient tried to go to the vein and vascular center but was unable to do so so she came here.  Patient's last dialysis was Tuesday.  Patient has no complaints.  No shortness of breath.  Physical Exam   Triage Vital Signs: ED Triage Vitals  Enc Vitals Group     BP 03/15/22 1006 (!) 157/45     Pulse Rate 03/15/22 1005 74     Resp 03/15/22 1005 16     Temp 03/15/22 1005 97.8 F (36.6 C)     Temp Source 03/15/22 1005 Oral     SpO2 03/15/22 1005 96 %     Weight 03/15/22 1005 154 lb 15.7 oz (70.3 kg)     Height 03/15/22 1005 '4\' 11"'$  (1.499 m)     Head Circumference --      Peak Flow --      Pain Score 03/15/22 1005 0     Pain Loc --      Pain Edu? --      Excl. in [place]? --     Most recent vital signs: Vitals:   03/15/22 1005 03/15/22 1006  BP:  (!) 157/45  Pulse: 74   Resp: 16   Temp: 97.8 F (36.6 C)   SpO2: 96%     General: Awake, no distress.  CV:  Good peripheral perfusion.  Regular rate and rhythm  Resp:  Normal effort.  Equal breath sounds bilaterally.  Abd:  No distention.  Soft, nontender.  No rebound or guarding. Other:  Patient has a right permacath in her right femoral vein.   ED Results / Procedures / Treatments   MEDICATIONS ORDERED IN ED: Medications - No data to display   IMPRESSION / MDM / [place] /  ED COURSE  I reviewed the triage vital signs and the nursing notes.  Patient's presentation is most consistent with acute illness / injury with system symptoms.  Patient presents emergency department for displaced right permacath.  We will check basic labs.  I spoke to vascular surgery who is aware of the patient.  Patient has been n.p.o. since 7 PM last night.  Vascular planning to exchange the catheter depending on the chemistry results patient could likely be discharged home to have dialysis on Saturday/tomorrow as scheduled.  Patient agreeable to plan of care.  Patient's work-up is overall reassuring with reassuring CBC, chemistry shows a normal potassium of 4.6 I believe the patient will be safe to wait for dialysis tomorrow.  Patient is currently having her PermCath replaced.  FINAL CLINICAL IMPRESSION(S) / ED DIAGNOSES   Vascular access issue   Note:  This document was prepared using Dragon voice recognition software and may include unintentional dictation errors.   Harvest Dark, MD 03/15/22 1436

## 2022-03-18 ENCOUNTER — Encounter: Payer: Self-pay | Admitting: Vascular Surgery

## 2022-04-01 NOTE — Progress Notes (Signed)
Cardiology Office Note    Date:  04/05/2022   ID:  Kelli Nguyen, DOB 09-07-1945, MRN [id number]  PCP:  Crissie Figures, PA-C  Cardiologist:  Nelva Bush, MD  Electrophysiologist:  None   Chief Complaint: Follow-up for coronary artery disease  History of Present Illness:   Kelli Nguyen is a 76 y.o. female with history of CAD status post three-vessel CABG in 2008 with LIMA to LAD, SVG to OM, and SVG to PDA, pulmonary hypertension, ESRD on HD (TTS), DM2, HTN, HLD, neuropathy, anemia of chronic disease, and chronic cough who presents for follow-up of her CAD.   LHC in 2015 was suggestive of severe disease involving the LIMA to LAD, though the graft was apparently not well visualized.  No intervention was undertaken at that time.   She was seen in our office in 08/2020, at the request of vascular surgery for preoperative cardiac risk stratification for HERO graft placement.  She has previously had issues with dialysis access and required excision of infected left arm HERO graft in 01/2019.  She was without symptoms of angina or decompensation at that time.  Given planned vascular surgery and limited functional capacity, she underwent Lexiscan MPI on 09/20/2020 which showed no evidence of ischemia and was overall low risk.  LV systolic function was hyperdynamic estimated at greater than 65%.  Echo at that time demonstrated an EF of 55 to 60%, no regional wall motion abnormalities, grade 2 diastolic dysfunction, low normal RV systolic function with normal ventricular cavity size, moderately elevated PASP estimated at 40.7 mmHg, and no significant valvular abnormalities.  Given the above findings, she was felt to be acceptable risk for noncardiac procedure with HERO graft placement.   She was seen in the office on 07/25/2021 for preoperative cardiac risk stratification for banding of HERO graft.  She was without symptoms of angina or decompensation.  She underwent successful banding of right arm  hero graft on 08/03/2021 followed by tunneled hemodialysis catheter placement of the right femoral vein on 08/06/2021.   ABIs normal bilaterally on 08/22/2021.  She was last seen in the office in 09/2021 and was without symptoms of angina or decompensation.  Since she was last seen in the office, she has been evaluated by vascular surgery several times for complications associated with dialysis access secondary to thrombosed catheter requiring multiple catheter exchanges.  She presents today for a follow up of coronary artery disease. She was evaluated and treated in the ED on 03/15/22 for a non-functioning permcath, replaced by Dr. Lucky Cowboy. Since then, she has had no issues with her HD cath or HD cath site. She continues TTS HD sessions. From a cardiac perspective, she has not had any CP, palpitations, wheezing, SOB, orthopnea, presyncope, syncope. She endorses chronic bilateral edema, she wears compression socks for this and today they are at baseline for her. She continues to be independent, uses a rollator for ambulation and was going to "run errands" for the rest of the day. Discussed checking her cholesterol again, she advises that it is managed by her PCP.    Labs independently reviewed: 03/2022 - potassium 4.6, BUN 68, serum creatinine 9.54, Hgb 9.0, PLT 201 11/2020 - albumin 3.8, AST/ALT normal, A1c 5.2 07/2017 - magnesium 2.3  Past Medical History:  Diagnosis Date   Anemia    Carpal tunnel syndrome of left wrist    Cataract, bilateral    Chronic cough    Closed displaced fracture of left femoral neck ([place]) 03/09/2017  Coronary artery disease    a.) s/p 3v CABG (LIMA-LAD, SVG-OM1, SVG-PDA) in 2008. b.) LHC 02/26/2014: 70% dLM. 80% pLAD, 70-80% mLCx, chronic CTO in the RCA; SVG-OM1 and SVG-PDA patent; 80-90% LIMA-LAD at the anastamosis and 80-90% at dLAD after anastamosis; very tortous LEFT subclavian aftery LIMA takeoff (difficult engagement of LIMA via femoral approach).   Diabetes mellitus  without complication (HCC)    Diastolic dysfunction    a.) TTE 09/20/2020: EF 55-60%; low normal RVSF; RVSP elevated at 40.7 mmHg; mild TR; G2DD.   ESRD (end stage renal disease) on dialysis ([place])    a.) T-Th-Sat   First degree AV block    GERD (gastroesophageal reflux disease)    Glaucoma    HLD (hyperlipidemia)    Hypertension    Murmur    OA (osteoarthritis)    PAD (peripheral artery disease) (HCC)    Pneumonia    Polyneuropathy    feet and hands   Pulmonary hypertension (HCC)    RBBB    Secondary hyperparathyroidism of renal origin ([place])    Sepsis ([place]) 06/28/2017   Wears dentures    partial upper and lower    Past Surgical History:  Procedure Laterality Date   A/V SHUNT INTERVENTION N/A 10/16/2016   Procedure: A/V Shunt Intervention;  Surgeon: Katha Cabal, MD;  Location: [place] [facility];  Service: Cardiovascular;  Laterality: N/A;   A/V SHUNTOGRAM Left 10/16/2016   Procedure: A/V Shuntogram;  Surgeon: Katha Cabal, MD;  Location: [place] [facility];  Service: Cardiovascular;  Laterality: Left;   ABDOMINAL HYSTERECTOMY     CARPAL TUNNEL RELEASE Left 09/27/2015   Procedure: CARPAL TUNNEL RELEASE;  Surgeon: Leanor Kail, MD;  Location: [facility];  Service: Orthopedics;  Laterality: Left;   CATARACT EXTRACTION W/PHACO Right 09/23/2016   Procedure: CATARACT EXTRACTION PHACO AND INTRAOCULAR LENS PLACEMENT ([facility]) complicated diabetic right;  Surgeon: Ronnell Freshwater, MD;  Location: [facility];  Service: Ophthalmology;  Laterality: Right;  Diabetic - diet controlled Potassium draw before surgery   CATARACT EXTRACTION W/PHACO Left 01/20/2017   Procedure: CATARACT EXTRACTION PHACO AND INTRAOCULAR LENS PLACEMENT ([place]) LEFT DIABETIC;  Surgeon: Eulogio Bear, MD;  Location: [facility];  Service: Ophthalmology;  Laterality: Left;  Diabetic - diet controlled Potassium draw prior to procedure  8:00 ARRIVAL   COLONOSCOPY WITH PROPOFOL  N/A 11/28/2014   Procedure: COLONOSCOPY WITH PROPOFOL;  Surgeon: Manya Silvas, MD;  Location: [facility];  Service: Endoscopy;  Laterality: N/A;   CORONARY ARTERY BYPASS GRAFT N/A 2008   Procedure: 3v CORONARY ARTERY BYPASS GRAFT (LIMA-LAD, SVG-OM1, SVG-PDA); Location: [facility]; Surgeon: Jannett Celestine, MD   DIALYSIS/PERMA CATHETER INSERTION Left 01/21/2019   Procedure: DIALYSIS/PERMA CATHETER EXCHANGE;  Surgeon: Algernon Huxley, MD;  Location: [place] [facility];  Service: Cardiovascular;  Laterality: Left;   DIALYSIS/PERMA CATHETER INSERTION N/A 08/06/2021   Procedure: DIALYSIS/PERMA CATHETER INSERTION;  Surgeon: Algernon Huxley, MD;  Location: [place] [facility];  Service: Cardiovascular;  Laterality: N/A;   DIALYSIS/PERMA CATHETER INSERTION N/A 09/27/2021   Procedure: DIALYSIS/PERMA CATHETER INSERTION;  Surgeon: Algernon Huxley, MD;  Location: [place] [facility];  Service: Cardiovascular;  Laterality: N/A;   DIALYSIS/PERMA CATHETER INSERTION N/A 11/02/2021   Procedure: DIALYSIS/PERMA CATHETER INSERTION;  Surgeon: Algernon Huxley, MD;  Location: [place] [facility];  Service: Cardiovascular;  Laterality: N/A;   DIALYSIS/PERMA CATHETER INSERTION N/A 01/14/2022   Procedure: DIALYSIS/PERMA CATHETER INSERTION;  Surgeon: Algernon Huxley, MD;  Location: [place]  [facility];  Service: Cardiovascular;  Laterality: N/A;   DIALYSIS/PERMA CATHETER INSERTION N/A 03/15/2022   Procedure: DIALYSIS/PERMA CATHETER INSERTION;  Surgeon: Algernon Huxley, MD;  Location: [place] [facility];  Service: Cardiovascular;  Laterality: N/A;   DIALYSIS/PERMA CATHETER REMOVAL N/A 03/12/2021   Procedure: DIALYSIS/PERMA CATHETER REMOVAL;  Surgeon: Algernon Huxley, MD;  Location: [place] [facility];  Service: Cardiovascular;  Laterality: N/A;   HEMIARTHROPLASTY HIP Left 2018   INSERTION OF DIALYSIS CATHETER Left 01/19/2019   Procedure: INSERTION OF DIALYSIS CATHETER;  Surgeon: Katha Cabal, MD;  Location: [facility];  Service:  Vascular;  Laterality: Left;   LEFT HEART CATH AND CORS/GRAFTS ANGIOGRAPHY N/A 02/26/2014   Procedure: LEFT HEART CATH AND CORS/GRAFTS ANGIOGRAPHY; Location: [facility]; Surgeon: Trula Slade, MD   LIGATIONS OF HERO GRAFT Left 01/19/2019   Procedure: EXCISION LEFT HERO GRAFT;  Surgeon: Katha Cabal, MD;  Location: [facility];  Service: Vascular;  Laterality: Left;   NEPHRECTOMY Left    PERIPHERAL VASCULAR CATHETERIZATION Left 01/18/2015   Procedure: A/V Shuntogram/Fistulagram;  Surgeon: Katha Cabal, MD;  Location: [place] [facility];  Service: Cardiovascular;  Laterality: Left;   PERIPHERAL VASCULAR CATHETERIZATION Left 01/18/2015   Procedure: A/V Shunt Intervention;  Surgeon: Katha Cabal, MD;  Location: [place] [facility];  Service: Cardiovascular;  Laterality: Left;   PERIPHERAL VASCULAR CATHETERIZATION N/A 08/10/2015   Procedure: Thrombectomy;  Surgeon: Algernon Huxley, MD;  Location: [place] [facility];  Service: Cardiovascular;  Laterality: N/A;   PERIPHERAL VASCULAR CATHETERIZATION N/A 08/10/2015   Procedure: A/V Shuntogram/Fistulagram;  Surgeon: Algernon Huxley, MD;  Location: [place] [facility];  Service: Cardiovascular;  Laterality: N/A;   PERIPHERAL VASCULAR CATHETERIZATION N/A 08/10/2015   Procedure: A/V Shunt Intervention;  Surgeon: Algernon Huxley, MD;  Location: [place] [facility];  Service: Cardiovascular;  Laterality: N/A;   PERIPHERAL VASCULAR THROMBECTOMY Left 01/12/2019   Procedure: PERIPHERAL VASCULAR THROMBECTOMY;  Surgeon: Katha Cabal, MD;  Location: [place] [facility];  Service: Cardiovascular;  Laterality: Left;   REMOVAL OF A DIALYSIS CATHETER Right 01/19/2019   Procedure: REMOVAL OF A DIALYSIS CATHETER;  Surgeon: Katha Cabal, MD;  Location: [facility];  Service: Vascular;  Laterality: Right;   RENAL SHUNTS     UPPER EXTREMITY ANGIOGRAPHY Right 12/20/2020   Procedure: UPPER EXTREMITY ANGIOGRAPHY;  Surgeon: Katha Cabal, MD;  Location: [place] [facility];  Service: Cardiovascular;  Laterality: Right;   UPPER EXTREMITY ANGIOGRAPHY Right 06/22/2021   Procedure: Upper Extremity Angiography;  Surgeon: Katha Cabal, MD;  Location: [place] [facility];  Service: Cardiovascular;  Laterality: Right;   UPPER EXTREMITY VENOGRAPHY Right 03/20/2020   Procedure: UPPER EXTREMITY VENOGRAPHY;  Surgeon: Algernon Huxley, MD;  Location: [place] [facility];  Service: Cardiovascular;  Laterality: Right;   VASCULAR ACCESS DEVICE INSERTION Right 11/24/2020   Procedure: INSERTION OF HERO VASCULAR ACCESS DEVICE (GRAFT);  Surgeon: Katha Cabal, MD;  Location: [facility];  Service: Vascular;  Laterality: Right;    Current Medications: Current Meds  Medication Sig   acetaminophen (TYLENOL) 500 MG tablet Take 500 mg by mouth every 6 (six) hours as needed for moderate pain or headache.   aspirin 81 MG tablet Take 81 mg by mouth daily.   cephALEXin (KEFLEX) 500 MG capsule Take 500 mg by mouth 2 (two) times daily.   cinacalcet (SENSIPAR) 30 MG tablet Take 30 mg by mouth Every Tuesday,Thursday,and Saturday with dialysis.   clopidogrel (PLAVIX)  75 MG tablet TAKE 1 TABLET BY MOUTH EVERY DAY   diclofenac Sodium (VOLTAREN) 1 % GEL Apply topically.   docusate sodium (COLACE) 100 MG capsule Take 100 mg by mouth daily.   latanoprost (XALATAN) 0.005 % ophthalmic solution Place 1 drop into both eyes at bedtime.   Methoxy PEG-Epoetin Beta (MIRCERA IJ) Mircera   midodrine (PROAMATINE) 5 MG tablet Take 1 tablet (5 mg total) by mouth Every Tuesday,Thursday,and Saturday with dialysis. Please give 30 minutes prior to dialysis. Do NOT hold for dialysis. GIVE DOSE DURING DAYLIGHT HOURS ONLY   pantoprazole (PROTONIX) 40 MG tablet Take 40 mg by mouth daily.   polyethylene glycol powder (GLYCOLAX/MIRALAX) 17 GM/SCOOP powder    pravastatin (PRAVACHOL) 20 MG tablet Take 20 mg by mouth daily.   sevelamer carbonate (RENVELA) 800 MG tablet Take 1,600 mg by mouth 3 (three)  times daily with meals.   timolol (TIMOPTIC) 0.5 % ophthalmic solution Place 1 drop into both eyes daily.   ZYRTEC ALLERGY 10 MG tablet Take 10 mg by mouth daily.    Allergies:   Gadolinium derivatives, Penicillins, Codeine, Codeine sulfate, Contrast media [iodinated contrast media], Gadobutrol, Metrizamide, and Statins   Social History   Socioeconomic History   Marital status: Widowed    Spouse name: Not on file   Number of children: 5   Years of education: Not on file   Highest education level: Not on file  Occupational History   Not on file  Tobacco Use   Smoking status: Never   Smokeless tobacco: Never  Vaping Use   Vaping Use: Never used  Substance and Sexual Activity   Alcohol use: No   Drug use: No   Sexual activity: Not on file  Other Topics Concern   Not on file  Social History Narrative   Lives with son   Social Determinants of Health   Financial Resource Strain: Not on file  Food Insecurity: Not on file  Transportation Needs: Not on file  Physical Activity: Not on file  Stress: Not on file  Social Connections: Not on file     Family History:  The patient's family history includes Hypertension in her sister.  ROS:   Review of Systems  Constitutional: Negative.   HENT: Negative.    Eyes: Negative.   Respiratory:  Negative for cough, shortness of breath and wheezing.   Cardiovascular:  Positive for leg swelling (chronic, bilateral). Negative for chest pain, palpitations and orthopnea.  Gastrointestinal: Negative.   Genitourinary: Negative.   Musculoskeletal: Negative.   Skin: Negative.   Neurological:  Negative for dizziness and loss of consciousness.  Endo/Heme/Allergies: Negative.   Psychiatric/Behavioral: Negative.        EKGs/Labs/Other Studies Reviewed:    Studies reviewed were summarized above. The additional studies were reviewed today:  Lexiscan MPI 09/20/2020: No T wave inversion was noted during stress. There was no ST segment  deviation noted during stress. The study is normal. This is a low risk study. The left ventricular ejection fraction is hyperdynamic (>65%). There is no evidence for ischemia __________   2D echo 09/20/2020: 1. Left ventricular ejection fraction, by estimation, is 55 to 60%. The  left ventricle has normal function. The left ventricle has no regional  wall motion abnormalities. Left ventricular diastolic parameters are  consistent with Grade II diastolic  dysfunction (pseudonormalization).   2. Right ventricular systolic function is low normal. The right  ventricular size is normal. There is moderately elevated pulmonary artery  systolic pressure. The estimated  right ventricular systolic pressure is  73.7 mmHg.   3. The mitral valve was not well visualized. No evidence of mitral valve  regurgitation.   4. The aortic valve was not well visualized. Aortic valve regurgitation  is not visualized.   5. The inferior vena cava is normal in size with greater than 50%  respiratory variability, suggesting right atrial pressure of 3 mmHg. __________   2D echo 07/02/2017 [facility]): EF greater than 55%, mild mitral regurgitation, aortic sclerosis, mild to moderate tricuspid regurgitation, normal RV systolic function, moderate pulmonary hypertension. __________   LHC 02/26/2014 [facility]): LMCA with 70% distal stenosis extending into ostial LAD and LCx.  LAD has 80% ostial/proximal stenosis and diffuse 90 to 95% disease in the mid vessel.  Distal LAD fills via LIMA graft.  LCx demonstrates chronic total occlusion of OM2, which is supplied by SVG.  LCx has 60% ostial disease and 70 to 80% mid vessel stenosis.  Distal LCx is subtotally occluded with left to left collaterals.  Dominant RCA is chronically occluded in its midsection and Illes distally via patent SVG to PDA.  SVG to OM and SVG to RPDA are widely patent.  LIMA to LAD is not well visualized but appears to have at least 80 to 90% stenosis at the distal  anastomosis extending into the native LAD.   EKG:  EKG is ordered today.  The EKG ordered today demonstrates SR with 1st degree AV block, RBBB, HR 66 bpm.  Recent Labs: 03/15/2022: BUN 68; Creatinine, Ser 9.54; Hemoglobin 9.0; Platelets 201; Potassium 4.6; Sodium 141  Recent Lipid Panel No results found for: "CHOL", "TRIG", "HDL", "CHOLHDL", "VLDL", "LDLCALC", "LDLDIRECT"  PHYSICAL EXAM:    VS:  BP 120/60 (BP Location: Left Arm, Patient Position: Sitting, Cuff Size: Large)   Pulse 66   Ht '4\' 11"'$  (1.499 m)   Wt 155 lb 8 oz (70.5 kg)   SpO2 97%   BMI 31.41 kg/m   BMI: Body mass index is 31.41 kg/m.  Physical Exam Vitals and nursing note reviewed.  Constitutional:      General: She is not in acute distress.    Appearance: She is not toxic-appearing.  Neck:     Vascular: No carotid bruit.  Cardiovascular:     Rate and Rhythm: Normal rate. Rhythm irregular.     Pulses: Normal pulses.     Heart sounds: Murmur (right upper sternal border 3/6) heard.     No friction rub. No gallop.  Pulmonary:     Breath sounds: Normal breath sounds.  Abdominal:     Palpations: Abdomen is soft.  Musculoskeletal:     Right lower leg: Edema (non-pitting, baseline for pt) present.     Left lower leg: Edema (non-pitting, chronic baseline for pt) present.  Skin:    General: Skin is warm and dry.  Neurological:     Mental Status: She is alert and oriented to person, place, and time.  Psychiatric:        Mood and Affect: Mood normal.        Behavior: Behavior normal.     Wt Readings from Last 3 Encounters:  04/05/22 155 lb 8 oz (70.5 kg)  03/15/22 154 lb 15.7 oz (70.3 kg)  01/14/22 155 lb (70.3 kg)     ASSESSMENT & PLAN:   CAD status post CABG without angina: No episodes of chest pain, SOB, palpitations. Continue current medication regimen, ASA, plavix (managed by [facility]).   Pulmonary hypertension: Denies SOB, SPO2 97% RA today.  Volume managed by HD.   HLD: managed by her PCP  ESRD on  dialysis: TTS. HD per nephrology.   DM2 with vascular disease: managed by he PCP. ABIs normal bilaterally 4/23.          Disposition: F/u with Dr. Saunders Revel or an APP in 6 months.   Medication Adjustments/Labs and Tests Ordered: Current medicines are reviewed at length with the patient today.  Concerns regarding medicines are outlined above. Medication changes, Labs and Tests ordered today are summarized above and listed in the Patient Instructions accessible in Encounters.   Signed, Venia Carbon NP  [facility] [address] [place] [address] [place], [place] [address] [phone]

## 2022-04-04 ENCOUNTER — Ambulatory Visit: Payer: Medicare Other | Admitting: Physician Assistant

## 2022-04-05 ENCOUNTER — Encounter: Payer: Self-pay | Admitting: Physician Assistant

## 2022-04-05 ENCOUNTER — Ambulatory Visit: Payer: Medicare Other | Attending: Physician Assistant | Admitting: Cardiology

## 2022-04-05 VITALS — BP 120/60 | HR 66 | Ht 59.0 in | Wt 155.5 lb

## 2022-04-05 DIAGNOSIS — N186 End stage renal disease: Secondary | ICD-10-CM | POA: Diagnosis not present

## 2022-04-05 DIAGNOSIS — E1151 Type 2 diabetes mellitus with diabetic peripheral angiopathy without gangrene: Secondary | ICD-10-CM | POA: Diagnosis not present

## 2022-04-05 DIAGNOSIS — I2510 Atherosclerotic heart disease of native coronary artery without angina pectoris: Secondary | ICD-10-CM | POA: Diagnosis not present

## 2022-04-05 DIAGNOSIS — I272 Pulmonary hypertension, unspecified: Secondary | ICD-10-CM

## 2022-04-05 DIAGNOSIS — Z951 Presence of aortocoronary bypass graft: Secondary | ICD-10-CM

## 2022-04-05 DIAGNOSIS — E785 Hyperlipidemia, unspecified: Secondary | ICD-10-CM

## 2022-04-05 NOTE — Patient Instructions (Addendum)
Medication Instructions:  No changes at this time.   *If you need a refill on your cardiac medications before your next appointment, please call your pharmacy*   Lab Work: None  If you have labs (blood work) drawn today and your tests are completely normal, you will receive your results only by: [place] (if you have MyChart) OR A paper copy in the mail If you have any lab test that is abnormal or we need to change your treatment, we will call you to review the results.   Testing/Procedures: None   Follow-Up: At [facility], you and your health needs are our priority.  As part of our continuing mission to provide you with exceptional heart care, we have created designated Provider Care Teams.  These Care Teams include your primary Cardiologist (physician) and Advanced Practice Providers (APPs -  Physician Assistants and Nurse Practitioners) who all work together to provide you with the care you need, when you need it.  We recommend signing up for the patient portal called "MyChart".  Sign up information is provided on this After Visit Summary.  MyChart is used to connect with patients for Virtual Visits (Telemedicine).  Patients are able to view lab/test results, encounter notes, upcoming appointments, etc.  Non-urgent messages can be sent to your provider as well.   To learn more about what you can do with MyChart, go to [email or web address].    Your next appointment:   6 month(s)  The format for your next appointment:   In Person  Provider:   Nelva Bush, MD or Christell Faith, PA-C        Important Information About Sugar

## 2022-05-09 ENCOUNTER — Ambulatory Visit: Payer: Medicare Other | Admitting: Podiatry

## 2022-05-09 ENCOUNTER — Encounter: Payer: Self-pay | Admitting: Podiatry

## 2022-05-09 VITALS — BP 132/74

## 2022-05-09 DIAGNOSIS — E1159 Type 2 diabetes mellitus with other circulatory complications: Secondary | ICD-10-CM | POA: Diagnosis not present

## 2022-05-09 DIAGNOSIS — M79675 Pain in left toe(s): Secondary | ICD-10-CM

## 2022-05-09 DIAGNOSIS — M79674 Pain in right toe(s): Secondary | ICD-10-CM

## 2022-05-09 DIAGNOSIS — B351 Tinea unguium: Secondary | ICD-10-CM | POA: Diagnosis not present

## 2022-05-09 NOTE — Progress Notes (Signed)
This patient returns to my office for at risk foot care.  This patient requires this care by a professional since this patient will be at risk due to having diabetes with vascular disease and ESRD.  This patient is unable to cut nails herself since the patient cannot reach her nails.These nails are painful walking and wearing shoes.  This patient presents for at risk foot care today.  General Appearance  Alert, conversant and in no acute stress.  Vascular  Dorsalis pedis and posterior tibial  pulses are not  palpable  bilaterally.  Capillary return is within normal limits  bilaterally. Temperature is within normal limits  bilaterally.  Neurologic  Senn-Weinstein monofilament wire test within normal limits  bilaterally. Muscle power within normal limits bilaterally.  Nails Thick disfigured discolored nails with subungual debris  from hallux to fifth toes bilaterally. No evidence of bacterial infection or drainage bilaterally.  Orthopedic  No limitations of motion  feet .  No crepitus or effusions noted.  No bony pathology or digital deformities noted.  Skin  normotropic skin with no porokeratosis noted bilaterally.  No signs of infections or ulcers noted.     Onychomycosis  Pain in right toes  Pain in left toes  Consent was obtained for treatment procedures.   Mechanical debridement of nails 1-5  bilaterally performed with a nail nipper.  Filed with dremel without incident.    Return office visit   3 months                   Told patient to return for periodic foot care and evaluation due to potential at risk complications.   Boneta Lucks DPM

## 2022-07-25 ENCOUNTER — Encounter: Payer: Self-pay | Admitting: Podiatry

## 2022-08-20 ENCOUNTER — Ambulatory Visit: Payer: Medicare Other | Admitting: Podiatry

## 2022-10-02 ENCOUNTER — Encounter: Payer: Self-pay | Admitting: *Deleted

## 2022-10-07 NOTE — Progress Notes (Unsigned)
Follow-up Outpatient Visit Date: 10/09/2022  Primary Care Provider: Gorden Harms, PA-C [address] [place] [address]  Chief Complaint: Follow-up coronary artery disease  HPI:  Kelli Nguyen is a 77 y.o. female with history of coronary artery disease status post CABG in 2008 (LIMA to LAD, SVG to OM, and SVG to PDA), hypertension, hyperlipidemia, diabetes mellitus, Barack Nicodemus-stage renal disease on hemodialysis, chronic cough, neuropathy, and anemia, who presents for follow-up of coronary artery disease.  She was last seen in our office in 04/2022 by Wallis Bamberg, NP, at which time she was doing well from a heart standpoint.  She continued to have issues with her hemodialysis axis, having been seen in the ED in early November due to non-functional tunneled hemodialysis catheter.  Chronic bilateral leg edema was unchanged from baseline.  No medication changes or additional testing were pursued.  Today, Kelli Nguyen reports that she feels well.  She has not had any chest pain, shortness of breath, palpitations, or lightheadedness.  She notes that her blood pressures are still low at the [place] of hemodialysis, with systolic readings into the 80s (she is asymptomatic).  She continues to have difficulties with hemodialysis access and is currently dialyzing through a PermCath in the groin.  She is undergoing evaluation at [facility] for possible peritoneal dialysis catheter placement.  She has chronic lower extremity edema, which is stable.  She remains on dual antiplatelet therapy, which has been managed by vascular surgery, without bleeding.  --------------------------------------------------------------------------------------------------  Past Medical History:  Diagnosis Date   Anemia    Carpal tunnel syndrome of left wrist    Cataract, bilateral    Chronic cough    Closed displaced fracture of left femoral neck (HCC) 03/09/2017   Coronary artery disease    a.) s/p 3v CABG (LIMA-LAD, SVG-OM1, SVG-PDA)  in 2008. b.) LHC 02/26/2014: 70% dLM. 80% pLAD, 70-80% mLCx, chronic CTO in the RCA; SVG-OM1 and SVG-PDA patent; 80-90% LIMA-LAD at the anastamosis and 80-90% at dLAD after anastamosis; very tortous LEFT subclavian aftery LIMA takeoff (difficult engagement of LIMA via femoral approach).   Diabetes mellitus without complication (HCC)    Diastolic dysfunction    a.) TTE 09/20/2020: EF 55-60%; low normal RVSF; RVSP elevated at 40.7 mmHg; mild TR; G2DD.   ESRD (Para Cossey stage renal disease) on dialysis (HCC)    a.) T-Th-Sat   First degree AV block    GERD (gastroesophageal reflux disease)    Glaucoma    HLD (hyperlipidemia)    Hypertension    Murmur    OA (osteoarthritis)    PAD (peripheral artery disease) (HCC)    Pneumonia    Polyneuropathy    feet and hands   Pulmonary hypertension (HCC)    RBBB    Secondary hyperparathyroidism of renal origin (HCC)    Sepsis (HCC) 06/28/2017   Wears dentures    partial upper and lower   Past Surgical History:  Procedure Laterality Date   A/V SHUNT INTERVENTION N/A 10/16/2016   Procedure: A/V Shunt Intervention;  Surgeon: Renford Dills, MD;  Location: [facility];  Service: Cardiovascular;  Laterality: N/A;   A/V SHUNTOGRAM Left 10/16/2016   Procedure: A/V Shuntogram;  Surgeon: Renford Dills, MD;  Location: [facility];  Service: Cardiovascular;  Laterality: Left;   ABDOMINAL HYSTERECTOMY     CARPAL TUNNEL RELEASE Left 09/27/2015   Procedure: CARPAL TUNNEL RELEASE;  Surgeon: Erin Sons, MD;  Location: [facility];  Service: Orthopedics;  Laterality: Left;   CATARACT  EXTRACTION W/PHACO Right 09/23/2016   Procedure: CATARACT EXTRACTION PHACO AND INTRAOCULAR LENS PLACEMENT (IOC) complicated diabetic right;  Surgeon: Sherald Hess, MD;  Location: [facility];  Service: Ophthalmology;  Laterality: Right;  Diabetic - diet controlled Potassium draw before surgery   CATARACT EXTRACTION W/PHACO Left 01/20/2017    Procedure: CATARACT EXTRACTION PHACO AND INTRAOCULAR LENS PLACEMENT (IOC) LEFT DIABETIC;  Surgeon: Nevada Crane, MD;  Location: [facility];  Service: Ophthalmology;  Laterality: Left;  Diabetic - diet controlled Potassium draw prior to procedure  8:00 ARRIVAL   COLONOSCOPY WITH PROPOFOL N/A 11/28/2014   Procedure: COLONOSCOPY WITH PROPOFOL;  Surgeon: Scot Jun, MD;  Location: [facility];  Service: Endoscopy;  Laterality: N/A;   CORONARY ARTERY BYPASS GRAFT N/A 2008   Procedure: 3v CORONARY ARTERY BYPASS GRAFT (LIMA-LAD, SVG-OM1, SVG-PDA); Location: [facility]; Surgeon: Reggie Pile, MD   DIALYSIS/PERMA CATHETER INSERTION Left 01/21/2019   Procedure: DIALYSIS/PERMA CATHETER EXCHANGE;  Surgeon: Annice Needy, MD;  Location: [facility];  Service: Cardiovascular;  Laterality: Left;   DIALYSIS/PERMA CATHETER INSERTION N/A 08/06/2021   Procedure: DIALYSIS/PERMA CATHETER INSERTION;  Surgeon: Annice Needy, MD;  Location: [facility];  Service: Cardiovascular;  Laterality: N/A;   DIALYSIS/PERMA CATHETER INSERTION N/A 09/27/2021   Procedure: DIALYSIS/PERMA CATHETER INSERTION;  Surgeon: Annice Needy, MD;  Location: [facility];  Service: Cardiovascular;  Laterality: N/A;   DIALYSIS/PERMA CATHETER INSERTION N/A 11/02/2021   Procedure: DIALYSIS/PERMA CATHETER INSERTION;  Surgeon: Annice Needy, MD;  Location: [facility];  Service: Cardiovascular;  Laterality: N/A;   DIALYSIS/PERMA CATHETER INSERTION N/A 01/14/2022   Procedure: DIALYSIS/PERMA CATHETER INSERTION;  Surgeon: Annice Needy, MD;  Location: [facility];  Service: Cardiovascular;  Laterality: N/A;   DIALYSIS/PERMA CATHETER INSERTION N/A 03/15/2022   Procedure: DIALYSIS/PERMA CATHETER INSERTION;  Surgeon: Annice Needy, MD;  Location: [facility];  Service: Cardiovascular;  Laterality: N/A;   DIALYSIS/PERMA CATHETER REMOVAL N/A 03/12/2021   Procedure: DIALYSIS/PERMA CATHETER REMOVAL;   Surgeon: Annice Needy, MD;  Location: [facility];  Service: Cardiovascular;  Laterality: N/A;   HEMIARTHROPLASTY HIP Left 2018   INSERTION OF DIALYSIS CATHETER Left 01/19/2019   Procedure: INSERTION OF DIALYSIS CATHETER;  Surgeon: Renford Dills, MD;  Location: [facility];  Service: Vascular;  Laterality: Left;   LEFT HEART CATH AND CORS/GRAFTS ANGIOGRAPHY N/A 02/26/2014   Procedure: LEFT HEART CATH AND CORS/GRAFTS ANGIOGRAPHY; Location: [facility]; Surgeon: Orpha Bur, MD   LIGATIONS OF HERO GRAFT Left 01/19/2019   Procedure: EXCISION LEFT HERO GRAFT;  Surgeon: Renford Dills, MD;  Location: [facility];  Service: Vascular;  Laterality: Left;   NEPHRECTOMY Left    PERIPHERAL VASCULAR CATHETERIZATION Left 01/18/2015   Procedure: A/V Shuntogram/Fistulagram;  Surgeon: Renford Dills, MD;  Location: [facility];  Service: Cardiovascular;  Laterality: Left;   PERIPHERAL VASCULAR CATHETERIZATION Left 01/18/2015   Procedure: A/V Shunt Intervention;  Surgeon: Renford Dills, MD;  Location: [facility];  Service: Cardiovascular;  Laterality: Left;   PERIPHERAL VASCULAR CATHETERIZATION N/A 08/10/2015   Procedure: Thrombectomy;  Surgeon: Annice Needy, MD;  Location: [facility];  Service: Cardiovascular;  Laterality: N/A;   PERIPHERAL VASCULAR CATHETERIZATION N/A 08/10/2015   Procedure: A/V Shuntogram/Fistulagram;  Surgeon: Annice Needy, MD;  Location: [facility];  Service: Cardiovascular;  Laterality: N/A;   PERIPHERAL VASCULAR CATHETERIZATION N/A 08/10/2015   Procedure: A/V Shunt Intervention;  Surgeon:  Annice Needy, MD;  Location: [facility];  Service: Cardiovascular;  Laterality: N/A;   PERIPHERAL VASCULAR THROMBECTOMY Left 01/12/2019   Procedure: PERIPHERAL VASCULAR THROMBECTOMY;  Surgeon: Renford Dills, MD;  Location: [facility];  Service: Cardiovascular;  Laterality: Left;   REMOVAL OF A DIALYSIS CATHETER Right 01/19/2019    Procedure: REMOVAL OF A DIALYSIS CATHETER;  Surgeon: Renford Dills, MD;  Location: [facility];  Service: Vascular;  Laterality: Right;   RENAL SHUNTS     UPPER EXTREMITY ANGIOGRAPHY Right 12/20/2020   Procedure: UPPER EXTREMITY ANGIOGRAPHY;  Surgeon: Renford Dills, MD;  Location: [facility];  Service: Cardiovascular;  Laterality: Right;   UPPER EXTREMITY ANGIOGRAPHY Right 06/22/2021   Procedure: Upper Extremity Angiography;  Surgeon: Renford Dills, MD;  Location: [facility];  Service: Cardiovascular;  Laterality: Right;   UPPER EXTREMITY VENOGRAPHY Right 03/20/2020   Procedure: UPPER EXTREMITY VENOGRAPHY;  Surgeon: Annice Needy, MD;  Location: [facility];  Service: Cardiovascular;  Laterality: Right;   VASCULAR ACCESS DEVICE INSERTION Right 11/24/2020   Procedure: INSERTION OF HERO VASCULAR ACCESS DEVICE (GRAFT);  Surgeon: Renford Dills, MD;  Location: [facility];  Service: Vascular;  Laterality: Right;    Current Meds  Medication Sig   acetaminophen (TYLENOL) 500 MG tablet Take 500 mg by mouth every 6 (six) hours as needed for moderate pain or headache.   aspirin 81 MG tablet Take 81 mg by mouth daily.   cephALEXin (KEFLEX) 500 MG capsule Take 500 mg by mouth 2 (two) times daily.   cinacalcet (SENSIPAR) 30 MG tablet Take 30 mg by mouth Every Tuesday,Thursday,and Saturday with dialysis.   clopidogrel (PLAVIX) 75 MG tablet TAKE 1 TABLET BY MOUTH EVERY DAY   docusate sodium (COLACE) 100 MG capsule Take 100 mg by mouth daily.   latanoprost (XALATAN) 0.005 % ophthalmic solution Place 1 drop into both eyes at bedtime.   Methoxy PEG-Epoetin Beta (MIRCERA IJ) Mircera   midodrine (PROAMATINE) 5 MG tablet Take 1 tablet (5 mg total) by mouth Every Tuesday,Thursday,and Saturday with dialysis. Please give 30 minutes prior to dialysis. Do NOT hold for dialysis. GIVE DOSE DURING DAYLIGHT HOURS ONLY   polyethylene glycol powder (GLYCOLAX/MIRALAX) 17 GM/SCOOP  powder    pravastatin (PRAVACHOL) 20 MG tablet Take 20 mg by mouth daily.   sevelamer carbonate (RENVELA) 800 MG tablet Take 1,600 mg by mouth 3 (three) times daily with meals.   timolol (TIMOPTIC) 0.5 % ophthalmic solution Place 1 drop into both eyes daily.   ZYRTEC ALLERGY 10 MG tablet Take 10 mg by mouth daily.    Allergies: Gadolinium derivatives, Penicillins, Codeine, Codeine sulfate, Contrast media [iodinated contrast media], Gadobutrol, Metrizamide, and Statins  Social History   Tobacco Use   Smoking status: Never   Smokeless tobacco: Never  Vaping Use   Vaping Use: Never used  Substance Use Topics   Alcohol use: No   Drug use: No    Family History  Problem Relation Age of Onset   Hypertension Sister     Review of Systems: A 12-system review of systems was performed and was negative except as noted in the HPI.  --------------------------------------------------------------------------------------------------  Physical Exam: BP (!) 104/57 (BP Location: Left Wrist, Patient Position: Sitting, Cuff Size: Normal)   Pulse 75   Ht 4\' 11"  (1.499 m)   Wt 160 lb (72.6 kg)   SpO2 95%   BMI 32.32 kg/m   General:  NAD. Neck: No  JVD or HJR. Lungs: Clear to auscultation bilaterally without wheezes or crackles. Heart: Regular rate and rhythm without murmurs, rubs, or gallops. Abdomen: Soft, nontender, nondistended. Extremities: Trace ankle and pretibial edema bilaterally.  EKG: Normal sinus rhythm with first-degree AV block, right axis deviation, and right bundle branch block.  No significant change from prior tracing on 04/05/2022.  Lab Results  Component Value Date   WBC 6.9 03/15/2022   HGB 9.0 (L) 03/15/2022   HCT 29.6 (L) 03/15/2022   MCV 81.8 03/15/2022   PLT 201 03/15/2022    Lab Results  Component Value Date   NA 141 03/15/2022   K 4.6 03/15/2022   CL 100 03/15/2022   CO2 24 03/15/2022   BUN 68 (H) 03/15/2022   CREATININE 9.54 (H) 03/15/2022   GLUCOSE  90 03/15/2022   ALT 10 11/25/2020    --------------------------------------------------------------------------------------------------  ASSESSMENT AND PLAN: Coronary artery disease without angina and hyperlipidemia associated with type 2 diabetes mellitus: Ms. Mila Palmer is feeling well without symptoms to suggest worsening coronary insufficiency.  As best I can tell, she has not had a lipid panel checked since 2015.  She is currently on intermediate dose pravastatin.  We have agreed to check a lipid panel and ALT today; escalation of statin therapy should be considered if her LDL is above 70.  Continue antiplatelet therapy for secondary prevention, though I would favor discontinuation of clopidogrel to minimize risk of bleeding and continuation of long-term aspirin monotherapy.  Antiplatelet therapy has been managed thus far by vascular surgery given Ms. McGee's history of complicated HD access.  I encouraged her to speak with her vascular surgery team at her next appointment to see if it is appropriate to stop clopidogrel.  Alasia Enge-stage renal disease: Ms. Mila Palmer remains on hemodialysis but is considering transition to peritoneal dialysis.  I think this would be a good option for her given her challenging vascular access.  Follow-up: Return to clinic in 6 months.  Yvonne Kendall, MD 10/09/2022 9:20 AM

## 2022-10-09 ENCOUNTER — Other Ambulatory Visit
Admission: RE | Admit: 2022-10-09 | Discharge: 2022-10-09 | Disposition: A | Discharge status: Home / Self Care | Payer: Medicare Other | Source: Ambulatory Visit | Attending: Internal Medicine | Admitting: Internal Medicine

## 2022-10-09 ENCOUNTER — Ambulatory Visit: Payer: Medicare Other | Attending: Internal Medicine | Admitting: Internal Medicine

## 2022-10-09 ENCOUNTER — Encounter: Payer: Self-pay | Admitting: Internal Medicine

## 2022-10-09 VITALS — BP 104/57 | HR 75 | Ht 59.0 in | Wt 160.0 lb

## 2022-10-09 DIAGNOSIS — E785 Hyperlipidemia, unspecified: Secondary | ICD-10-CM

## 2022-10-09 DIAGNOSIS — E1169 Type 2 diabetes mellitus with other specified complication: Secondary | ICD-10-CM

## 2022-10-09 DIAGNOSIS — N186 End stage renal disease: Secondary | ICD-10-CM

## 2022-10-09 DIAGNOSIS — I2510 Atherosclerotic heart disease of native coronary artery without angina pectoris: Secondary | ICD-10-CM | POA: Diagnosis not present

## 2022-10-09 LAB — LIPID PANEL
Cholesterol: 112 mg/dL (ref 0–200)
HDL: 60 mg/dL (ref >40)
LDL Cholesterol: 31 mg/dL (ref 0–99)
Total CHOL/HDL Ratio: 1.9 RATIO
Triglycerides: 103 mg/dL (ref <150)
VLDL: 21 mg/dL (ref 0–40)

## 2022-10-09 NOTE — Patient Instructions (Signed)
Medication Instructions:  Continue current medications Ask your vascular doctor if you need to continue on Aspirin or Plavix *If you need a refill on your cardiac medications before your next appointment, please call your pharmacy*   Lab Work: Lipid Panel   If you have labs (blood work) drawn today and your tests are completely normal, you will receive your results only by: MyChart Message (if you have MyChart) OR A paper copy in the mail If you have any lab test that is abnormal or we need to change your treatment, we will call you to review the results.   Follow-Up: At [facility], you and your health needs are our priority.  As part of our continuing mission to provide you with exceptional heart care, we have created designated Provider Care Teams.  These Care Teams include your primary Cardiologist (physician) and Advanced Practice Providers (APPs -  Physician Assistants and Nurse Practitioners) who all work together to provide you with the care you need, when you need it.  We recommend signing up for the patient portal called "MyChart".  Sign up information is provided on this After Visit Summary.  MyChart is used to connect with patients for Virtual Visits (Telemedicine).  Patients are able to view lab/test results, encounter notes, upcoming appointments, etc.  Non-urgent messages can be sent to your provider as well.   To learn more about what you can do with MyChart, go to [email or web address].    Your next appointment:   6 month(s)  Provider:   You may see Yvonne Kendall, MD or one of the following Advanced Practice Providers on your designated Care Team:   Nicolasa Ducking, NP Eula Listen, PA-C Cadence Fransico Michael, PA-C Charlsie Quest, NP

## 2022-10-14 ENCOUNTER — Other Ambulatory Visit (INDEPENDENT_AMBULATORY_CARE_PROVIDER_SITE_OTHER): Payer: Self-pay | Admitting: Nurse Practitioner

## 2022-10-14 MED ORDER — CLOPIDOGREL BISULFATE 75 MG PO TABS: 75.0000 mg | ORAL_TABLET | Freq: Every day | ORAL | 1 refills | Status: AC

## 2023-03-15 ENCOUNTER — Ambulatory Visit: Payer: Medicare Other

## 2023-03-15 ENCOUNTER — Ambulatory Visit
Admission: EM | Admit: 2023-03-15 | Discharge: 2023-03-15 | Disposition: A | Discharge status: Home / Self Care | Payer: Medicare Other | Attending: Emergency Medicine | Admitting: Emergency Medicine

## 2023-03-15 DIAGNOSIS — R062 Wheezing: Secondary | ICD-10-CM | POA: Diagnosis not present

## 2023-03-15 DIAGNOSIS — R051 Acute cough: Secondary | ICD-10-CM | POA: Diagnosis not present

## 2023-03-15 MED ORDER — ALBUTEROL SULFATE (2.5 MG/3ML) 0.083% IN NEBU
2.5000 mg | INHALATION_SOLUTION | Freq: Once | RESPIRATORY_TRACT | Status: AC
  Administered 2023-03-15: 2.5 mg via RESPIRATORY_TRACT

## 2023-03-15 MED ORDER — ALBUTEROL SULFATE HFA 108 (90 BASE) MCG/ACT IN AERS: 1.0000 | INHALATION_SPRAY | RESPIRATORY_TRACT | 0 refills | Status: AC | PRN

## 2023-03-15 MED ORDER — AEROCHAMBER MV MISC: 1 refills | Status: AC

## 2023-03-15 NOTE — Discharge Instructions (Signed)
2 puffs from your albuterol inhaler using your spacer every 4 hours for 2 days, then every 6 hours for 2 days, then as needed.  Continue Tylenol and Robitussin DM as needed.  Follow-up with your primary care provider in several days to make sure that we are on the right track.  Go immediately to the emergency room for fevers above 100.4, shortness of breath, chest pain, worsening swelling in your legs, if you cannot lie down flat due to shortness of breath, abdominal pain, or for other concerns.

## 2023-03-15 NOTE — ED Provider Notes (Signed)
HPI  SUBJECTIVE:  Kelli Nguyen is a 77 y.o. female who presents with 2 weeks of a dry cough, wheezing, clear rhinorrhea.  No fevers, chest pain, shortness of breath, dyspnea on exertion, sore throat, nasal congestion, sinus pain or pressure, postnasal drip, GERD symptoms.  No change in her baseline lower extremity edema, orthopnea, unintentional weight gain, abdominal pain.  She is able to sleep at night without waking up coughing.  She has been on daily Keflex for the past 2 to 3 years for an infected hip replacement.  She is taking Tylenol and Robitussin DM.  Last dose of Tylenol was within 6 hours of evaluation.  No aggravating or alleviating factors.   She has a complex past medical history occluding coronary disease status post CABG x 3, diabetes, ERSD currently in training for home peritoneal dialysis, GERD, hyperlipidemia, hypertension, peripheral arterial disease, pneumonia, pulmonary hypertension, chronic cough, sepsis from pneumonia, superior vena cava syndrome, kidney cancer.  No history of pulmonary disease, smoking.  She is not on an ACE inhibitor.  PCP: [facility].  Past Medical History:  Diagnosis Date   Anemia    Carpal tunnel syndrome of left wrist    Cataract, bilateral    Chronic cough    Closed displaced fracture of left femoral neck (HCC) 03/09/2017   Coronary artery disease    a.) s/p 3v CABG (LIMA-LAD, SVG-OM1, SVG-PDA) in 2008. b.) LHC 02/26/2014: 70% dLM. 80% pLAD, 70-80% mLCx, chronic CTO in the RCA; SVG-OM1 and SVG-PDA patent; 80-90% LIMA-LAD at the anastamosis and 80-90% at dLAD after anastamosis; very tortous LEFT subclavian aftery LIMA takeoff (difficult engagement of LIMA via femoral approach).   Diabetes mellitus without complication (HCC)    Diastolic dysfunction    a.) TTE 09/20/2020: EF 55-60%; low normal RVSF; RVSP elevated at 40.7 mmHg; mild TR; G2DD.   ESRD (end stage renal disease) on dialysis (HCC)    a.) T-Th-Sat   First degree AV block    GERD  (gastroesophageal reflux disease)    Glaucoma    HLD (hyperlipidemia)    Hypertension    Murmur    OA (osteoarthritis)    PAD (peripheral artery disease) (HCC)    Pneumonia    Polyneuropathy    feet and hands   Pulmonary hypertension (HCC)    RBBB    Secondary hyperparathyroidism of renal origin (HCC)    Sepsis (HCC) 06/28/2017   Wears dentures    partial upper and lower    Past Surgical History:  Procedure Laterality Date   A/V SHUNT INTERVENTION N/A 10/16/2016   Procedure: A/V Shunt Intervention;  Surgeon: Renford Dills, MD;  Location: [facility];  Service: Cardiovascular;  Laterality: N/A;   A/V SHUNTOGRAM Left 10/16/2016   Procedure: A/V Shuntogram;  Surgeon: Renford Dills, MD;  Location: [facility];  Service: Cardiovascular;  Laterality: Left;   ABDOMINAL HYSTERECTOMY     CARPAL TUNNEL RELEASE Left 09/27/2015   Procedure: CARPAL TUNNEL RELEASE;  Surgeon: Erin Sons, MD;  Location: [facility];  Service: Orthopedics;  Laterality: Left;   CATARACT EXTRACTION W/PHACO Right 09/23/2016   Procedure: CATARACT EXTRACTION PHACO AND INTRAOCULAR LENS PLACEMENT (IOC) complicated diabetic right;  Surgeon: Sherald Hess, MD;  Location: [facility];  Service: Ophthalmology;  Laterality: Right;  Diabetic - diet controlled Potassium draw before surgery   CATARACT EXTRACTION W/PHACO Left 01/20/2017   Procedure: CATARACT EXTRACTION PHACO AND INTRAOCULAR LENS PLACEMENT (IOC) LEFT DIABETIC;  Surgeon: Nevada Crane, MD;  Location: [facility];  Service: Ophthalmology;  Laterality: Left;  Diabetic - diet controlled Potassium draw prior to procedure  8:00 ARRIVAL   COLONOSCOPY WITH PROPOFOL N/A 11/28/2014   Procedure: COLONOSCOPY WITH PROPOFOL;  Surgeon: Scot Jun, MD;  Location: [facility];  Service: Endoscopy;  Laterality: N/A;   CORONARY ARTERY BYPASS GRAFT N/A 2008   Procedure: 3v CORONARY ARTERY BYPASS GRAFT (LIMA-LAD,  SVG-OM1, SVG-PDA); Location: [facility]; Surgeon: Reggie Pile, MD   DIALYSIS/PERMA CATHETER INSERTION Left 01/21/2019   Procedure: DIALYSIS/PERMA CATHETER EXCHANGE;  Surgeon: Annice Needy, MD;  Location: [facility];  Service: Cardiovascular;  Laterality: Left;   DIALYSIS/PERMA CATHETER INSERTION N/A 08/06/2021   Procedure: DIALYSIS/PERMA CATHETER INSERTION;  Surgeon: Annice Needy, MD;  Location: [facility];  Service: Cardiovascular;  Laterality: N/A;   DIALYSIS/PERMA CATHETER INSERTION N/A 09/27/2021   Procedure: DIALYSIS/PERMA CATHETER INSERTION;  Surgeon: Annice Needy, MD;  Location: [facility];  Service: Cardiovascular;  Laterality: N/A;   DIALYSIS/PERMA CATHETER INSERTION N/A 11/02/2021   Procedure: DIALYSIS/PERMA CATHETER INSERTION;  Surgeon: Annice Needy, MD;  Location: [facility];  Service: Cardiovascular;  Laterality: N/A;   DIALYSIS/PERMA CATHETER INSERTION N/A 01/14/2022   Procedure: DIALYSIS/PERMA CATHETER INSERTION;  Surgeon: Annice Needy, MD;  Location: [facility];  Service: Cardiovascular;  Laterality: N/A;   DIALYSIS/PERMA CATHETER INSERTION N/A 03/15/2022   Procedure: DIALYSIS/PERMA CATHETER INSERTION;  Surgeon: Annice Needy, MD;  Location: [facility];  Service: Cardiovascular;  Laterality: N/A;   DIALYSIS/PERMA CATHETER REMOVAL N/A 03/12/2021   Procedure: DIALYSIS/PERMA CATHETER REMOVAL;  Surgeon: Annice Needy, MD;  Location: [facility];  Service: Cardiovascular;  Laterality: N/A;   HEMIARTHROPLASTY HIP Left 2018   INSERTION OF DIALYSIS CATHETER Left 01/19/2019   Procedure: INSERTION OF DIALYSIS CATHETER;  Surgeon: Renford Dills, MD;  Location: [facility];  Service: Vascular;  Laterality: Left;   LEFT HEART CATH AND CORS/GRAFTS ANGIOGRAPHY N/A 02/26/2014   Procedure: LEFT HEART CATH AND CORS/GRAFTS ANGIOGRAPHY; Location: [facility]; Surgeon: Orpha Bur, MD   LIGATIONS OF HERO GRAFT Left 01/19/2019   Procedure: EXCISION  LEFT HERO GRAFT;  Surgeon: Renford Dills, MD;  Location: [facility];  Service: Vascular;  Laterality: Left;   NEPHRECTOMY Left    PERIPHERAL VASCULAR CATHETERIZATION Left 01/18/2015   Procedure: A/V Shuntogram/Fistulagram;  Surgeon: Renford Dills, MD;  Location: [facility];  Service: Cardiovascular;  Laterality: Left;   PERIPHERAL VASCULAR CATHETERIZATION Left 01/18/2015   Procedure: A/V Shunt Intervention;  Surgeon: Renford Dills, MD;  Location: [facility];  Service: Cardiovascular;  Laterality: Left;   PERIPHERAL VASCULAR CATHETERIZATION N/A 08/10/2015   Procedure: Thrombectomy;  Surgeon: Annice Needy, MD;  Location: [facility];  Service: Cardiovascular;  Laterality: N/A;   PERIPHERAL VASCULAR CATHETERIZATION N/A 08/10/2015   Procedure: A/V Shuntogram/Fistulagram;  Surgeon: Annice Needy, MD;  Location: [facility];  Service: Cardiovascular;  Laterality: N/A;   PERIPHERAL VASCULAR CATHETERIZATION N/A 08/10/2015   Procedure: A/V Shunt Intervention;  Surgeon: Annice Needy, MD;  Location: [facility];  Service: Cardiovascular;  Laterality: N/A;   PERIPHERAL VASCULAR THROMBECTOMY Left 01/12/2019   Procedure: PERIPHERAL VASCULAR THROMBECTOMY;  Surgeon: Renford Dills, MD;  Location: [facility];  Service: Cardiovascular;  Laterality: Left;   REMOVAL OF A DIALYSIS CATHETER Right 01/19/2019   Procedure: REMOVAL OF A DIALYSIS CATHETER;  Surgeon: Renford Dills, MD;  Location: [facility];  Service: Vascular;  Laterality: Right;   RENAL SHUNTS     UPPER EXTREMITY ANGIOGRAPHY Right 12/20/2020   Procedure: UPPER EXTREMITY ANGIOGRAPHY;  Surgeon: Renford Dills, MD;  Location: [facility];  Service: Cardiovascular;  Laterality: Right;   UPPER EXTREMITY ANGIOGRAPHY Right 06/22/2021   Procedure: Upper Extremity Angiography;  Surgeon: Renford Dills, MD;  Location: [facility];  Service: Cardiovascular;  Laterality:  Right;   UPPER EXTREMITY VENOGRAPHY Right 03/20/2020   Procedure: UPPER EXTREMITY VENOGRAPHY;  Surgeon: Annice Needy, MD;  Location: [facility];  Service: Cardiovascular;  Laterality: Right;   VASCULAR ACCESS DEVICE INSERTION Right 11/24/2020   Procedure: INSERTION OF HERO VASCULAR ACCESS DEVICE (GRAFT);  Surgeon: Renford Dills, MD;  Location: [facility];  Service: Vascular;  Laterality: Right;    Family History  Problem Relation Age of Onset   Hypertension Sister     Social History   Tobacco Use   Smoking status: Never   Smokeless tobacco: Never  Vaping Use   Vaping status: Never Used  Substance Use Topics   Alcohol use: No   Drug use: No    No current facility-administered medications for this encounter.  Current Outpatient Medications:    acetaminophen (TYLENOL) 500 MG tablet, Take 500 mg by mouth every 6 (six) hours as needed for moderate pain or headache., Disp: , Rfl:    albuterol (VENTOLIN HFA) 108 (90 Base) MCG/ACT inhaler, Inhale 1-2 puffs into the lungs every 4 (four) hours as needed for wheezing or shortness of breath., Disp: 1 each, Rfl: 0   aspirin 81 MG tablet, Take 81 mg by mouth daily., Disp: , Rfl:    cinacalcet (SENSIPAR) 30 MG tablet, Take 30 mg by mouth Every Tuesday,Thursday,and Saturday with dialysis., Disp: , Rfl:    clopidogrel (PLAVIX) 75 MG tablet, Take 1 tablet (75 mg total) by mouth daily., Disp: 90 tablet, Rfl: 1   docusate sodium (COLACE) 100 MG capsule, Take 100 mg by mouth daily., Disp: , Rfl:    latanoprost (XALATAN) 0.005 % ophthalmic solution, Place 1 drop into both eyes at bedtime., Disp: , Rfl:    midodrine (PROAMATINE) 5 MG tablet, Take 1 tablet (5 mg total) by mouth Every Tuesday,Thursday,and Saturday with dialysis. Please give 30 minutes prior to dialysis. Do NOT hold for dialysis. GIVE DOSE DURING DAYLIGHT HOURS ONLY, Disp: , Rfl:    polyethylene glycol powder (GLYCOLAX/MIRALAX) 17 GM/SCOOP powder, , Disp: , Rfl:    pravastatin  (PRAVACHOL) 20 MG tablet, Take 20 mg by mouth daily., Disp: , Rfl:    sevelamer carbonate (RENVELA) 800 MG tablet, Take 1,600 mg by mouth 3 (three) times daily with meals., Disp: , Rfl:    Spacer/Aero-Holding Chambers (AEROCHAMBER MV) inhaler, Use as instructed, Disp: 1 each, Rfl: 1   timolol (TIMOPTIC) 0.5 % ophthalmic solution, Place 1 drop into both eyes daily., Disp: , Rfl:    ZYRTEC ALLERGY 10 MG tablet, Take 10 mg by mouth daily., Disp: , Rfl:    cephALEXin (KEFLEX) 500 MG capsule, Take 500 mg by mouth 2 (two) times daily., Disp: , Rfl:   Allergies  Allergen Reactions   Gadolinium Derivatives Hives    Other reaction(s): Unknown Other reaction(s): Other (See Comments) Other reaction(s): Unknown   Penicillins Hives, Itching and Swelling    Has patient had a PCN reaction causing immediate rash, facial/tongue/throat swelling, SOB or lightheadedness with hypotension: No Has patient had a PCN reaction causing severe rash  involving mucus membranes or skin necrosis: No Has patient had a PCN reaction that required hospitalization: No Has patient had a PCN reaction occurring within the last 10 years: No If all of the above answers are "NO", then may proceed with Cephalosporin use.  Penicillin allergy assessment completed on 03/22/20. Patient has received and tolerated cepholosporins incuding cefepime, ceftriaxone.    Codeine Hives   Codeine Sulfate Nausea Only   Contrast Media [Iodinated Contrast Media] Hives   Gadobutrol Hives   Metrizamide Hives   Statins Other (See Comments)    muscle pain     ROS  As noted in HPI.   Physical Exam  BP 107/86 (BP Location: Left Wrist)   Pulse 62   Temp 97.7 F (36.5 C) (Oral)   Ht 4\' 11"  (1.499 m)   Wt 72.6 kg   SpO2 99%   BMI 32.32 kg/m   Constitutional: Well developed, well nourished, no acute distress Eyes:  EOMI, conjunctiva normal bilaterally HENT: Normocephalic, atraumatic,mucus membranes moist.  No nasal congestion.  No sinus  tenderness.  No postnasal drip. Respiratory: Normal inspiratory effort, diffuse expiratory wheezing throughout all lung fields.  No rales or rhonchi. Cardiovascular: Normal rate, regular rhythm, no murmurs rubs or gallops GI: nondistended skin: No rash, skin intact Musculoskeletal: 1+ bilateral lower extremity edema, no calf tenderness.  Patient states that this is unchanged. Neurologic: Alert & oriented x 3, no focal neuro deficits Psychiatric: Speech and behavior appropriate   ED Course   Medications  albuterol (PROVENTIL) (2.5 MG/3ML) 0.083% nebulizer solution 2.5 mg (2.5 mg Nebulization Given 03/15/23 1439)    Orders Placed This Encounter  Procedures   DG Chest 2 View    Standing Status:   Standing    Number of Occurrences:   1    Order Specific Question:   Reason for Exam (SYMPTOM  OR DIAGNOSIS REQUIRED)    Answer:   Dry cough, wheezing for 2 weeks.  History of ERSD on peritoneal dialysis, pneumonia.  Rule out pulmonary edema, pleural effusion, pneumonia.    No results found for this or any previous visit (from the past 24 hour(s)). DG Chest 2 View  Result Date: 03/15/2023 CLINICAL DATA:  Dry cough and wheezing for 2 weeks. EXAM: CHEST - 2 VIEW COMPARISON:  Chest radiograph dated 11/25/2020. FINDINGS: The heart size and mediastinal contours are within normal limits. Vascular calcifications are seen in the aortic arch. Both lungs are clear. Degenerative changes are seen in the spine. Vascular stents/grafts are seen in the right arm and overlying the hemithorax. IMPRESSION: No active cardiopulmonary disease. Electronically Signed   By: Romona Curls M.D.   On: 03/15/2023 15:24    ED Clinical Impression  1. Acute cough   2. Wheezing      ED Assessment/Plan    Patient presents with 2 weeks of a dry cough, wheezing.  Denies chest pain, shortness of breath.  No change in the lower extremity edema.  She does not appear to be fluid overloaded.  No shortness of breath with  exertion, orthopnea, unintentional weight gain, abdominal pain.  She has normal vitals, but has diffuse wheezing throughout all lung fields.  Will check chest x-ray to evaluate for pneumonia versus pulmonary edema.  She had the cough prior to switching to peritoneal dialysis.  Will give albuterol nebulizer and reevaluate.  Reviewed imaging independently.  No consolidation.  Costophrenic angles sharp.  See radiology report for full details.  I do not appreciate any obvious pneumonia or pulmonary edema  on chest x-ray today.  She denies GERD symptoms.    On reevaluation, patient states that she feels better.  Improved air movement, still with persistent wheezing.  Will send home with regularly scheduled albuterol inhaler with a spacer for 4 days, then as needed thereafter.  No steroids because of the diabetes.  Will call daughter, Lawanna Kobus, at [phone] if x-ray comes back positive for any acute cardiopulmonary process.  Follow-up with PCP in several days.  Strict ER return precautions given to daughter and patient.  Reviewed radiology report.  No acute cardiopulmonary disease.  See report for details.  No change in management.  Plan as above.  Discussed imaging, MDM, treatment plan, and plan for follow-up with patient. Discussed sn/sx that should prompt return to the ED. patient agrees with plan.   Meds ordered this encounter  Medications   albuterol (PROVENTIL) (2.5 MG/3ML) 0.083% nebulizer solution 2.5 mg   albuterol (VENTOLIN HFA) 108 (90 Base) MCG/ACT inhaler    Sig: Inhale 1-2 puffs into the lungs every 4 (four) hours as needed for wheezing or shortness of breath.    Dispense:  1 each    Refill:  0   Spacer/Aero-Holding Chambers (AEROCHAMBER MV) inhaler    Sig: Use as instructed    Dispense:  1 each    Refill:  1      *This clinic note was created using Dragon dictation software. Therefore, there may be occasional mistakes despite careful proofreading.  ?    Domenick Gong,  MD 03/16/23 [phone]

## 2023-03-15 NOTE — ED Triage Notes (Signed)
Pt c/o cough x2weeks  Pt denies any Sob, fatigue, sore throat, or ear pain.  Pt denies being around anyone sick.   Pt has been using OTC tylenol and cough syrup and it has not helped.

## 2023-04-23 ENCOUNTER — Ambulatory Visit: Payer: Medicare Other | Attending: Internal Medicine | Admitting: Internal Medicine

## 2023-04-23 NOTE — Progress Notes (Deleted)
  Cardiology Office Note:  .   Date:  04/23/2023  ID:  Kelli Nguyen, DOB [birth year], MRN [id number] PCP: Gorden Harms, PA-C  [facility] Providers Cardiologist:  Yvonne Kendall, MD { Click to update primary MD,subspecialty MD or APP then REFRESH:1}    History of Present Illness: .   Kelli Nguyen is a 77 y.o. female with history of coronary artery disease status post CABG in 2008 (LIMA to LAD, SVG to OM, and SVG to PDA), hypertension, hyperlipidemia, diabetes mellitus, Domani Bakos-stage renal disease on hemodialysis, chronic cough, neuropathy, and anemia, who presents for follow-up of coronary artery disease.  I last saw her in June, which time she was feeling well.  Her only complaint was of continued low blood pressure readings at the [facility] of hemodialysis, albeit asymptomatic.  She was undergoing evaluation for peritoneal dialysis catheter placement at [facility].  We did not make any medication changes or pursue additional testing.  ROS: See HPI  Studies Reviewed: .        *** Risk Assessment/Calculations:   {Does this patient have ATRIAL FIBRILLATION?[phone]} No BP recorded.  {Refresh Note OR Click here to enter BP  :1}***       Physical Exam:   VS:  There were no vitals taken for this visit.   Wt Readings from Last 3 Encounters:  03/15/23 160 lb (72.6 kg)  10/09/22 160 lb (72.6 kg)  04/05/22 155 lb 8 oz (70.5 kg)    General:  NAD. Neck: No JVD or HJR. Lungs: Clear to auscultation bilaterally without wheezes or crackles. Heart: Regular rate and rhythm without murmurs, rubs, or gallops. Abdomen: Soft, nontender, nondistended. Extremities: No lower extremity edema.  ASSESSMENT AND PLAN: .    ***    {Are you ordering a CV Procedure (e.g. stress test, cath, DCCV, TEE, etc)?   Press F2        :[id number]}  Dispo: ***  Signed, Yvonne Kendall, MD

## 2023-04-24 ENCOUNTER — Encounter: Payer: Self-pay | Admitting: Internal Medicine

## 2023-09-18 ENCOUNTER — Ambulatory Visit: Admitting: Podiatry

## 2023-09-18 DIAGNOSIS — M62461 Contracture of muscle, right lower leg: Secondary | ICD-10-CM

## 2023-09-18 DIAGNOSIS — M62462 Contracture of muscle, left lower leg: Secondary | ICD-10-CM

## 2023-09-18 DIAGNOSIS — M722 Plantar fascial fibromatosis: Secondary | ICD-10-CM | POA: Diagnosis not present

## 2023-09-18 NOTE — Progress Notes (Signed)
 Subjective:  Patient ID: Kelli Nguyen, female    DOB: 08-06-1945,  MRN: [id number]  Chief Complaint  Patient presents with   Foot Pain    78 y.o. female presents with the above complaint.  Patient presents with bilateral complaint of heel pain that has been for quite some time is progressive and worse worse with ambulation or shoe pressure patient like to discuss treatment options pain scale 7 out of 10 dull aching nature denies any other acute complaints   Review of Systems: Negative except as noted in the HPI. Denies N/V/F/Ch.  Past Medical History:  Diagnosis Date   Anemia    Carpal tunnel syndrome of left wrist    Cataract, bilateral    Chronic cough    Closed displaced fracture of left femoral neck (HCC) 03/09/2017   Coronary artery disease    a.) s/p 3v CABG (LIMA-LAD, SVG-OM1, SVG-PDA) in 2008. b.) LHC 02/26/2014: 70% dLM. 80% pLAD, 70-80% mLCx, chronic CTO in the RCA; SVG-OM1 and SVG-PDA patent; 80-90% LIMA-LAD at the anastamosis and 80-90% at dLAD after anastamosis; very tortous LEFT subclavian aftery LIMA takeoff (difficult engagement of LIMA via femoral approach).   Diabetes mellitus without complication (HCC)    Diastolic dysfunction    a.) TTE 09/20/2020: EF 55-60%; low normal RVSF; RVSP elevated at 40.7 mmHg; mild TR; G2DD.   ESRD (end stage renal disease) on dialysis [facility])    a.) T-Th-Sat   First degree AV block    GERD (gastroesophageal reflux disease)    Glaucoma    HLD (hyperlipidemia)    Hypertension    Murmur    OA (osteoarthritis)    PAD (peripheral artery disease) (HCC)    Pneumonia    Polyneuropathy    feet and hands   Pulmonary hypertension (HCC)    RBBB    Secondary hyperparathyroidism of renal origin (HCC)    Sepsis (HCC) 06/28/2017   Wears dentures    partial upper and lower    Current Outpatient Medications:    acetaminophen  (TYLENOL ) 500 MG tablet, Take 500 mg by mouth every 6 (six) hours as needed for moderate pain or headache., Disp: ,  Rfl:    albuterol  (VENTOLIN  HFA) 108 (90 Base) MCG/ACT inhaler, Inhale 1-2 puffs into the lungs every 4 (four) hours as needed for wheezing or shortness of breath., Disp: 1 each, Rfl: 0   aspirin  81 MG tablet, Take 81 mg by mouth daily., Disp: , Rfl:    cephALEXin  (KEFLEX ) 500 MG capsule, Take 500 mg by mouth 2 (two) times daily., Disp: , Rfl:    cinacalcet  (SENSIPAR ) 30 MG tablet, Take 30 mg by mouth Every Tuesday,Thursday,and Saturday with dialysis., Disp: , Rfl:    clopidogrel  (PLAVIX ) 75 MG tablet, Take 1 tablet (75 mg total) by mouth daily., Disp: 90 tablet, Rfl: 1   docusate sodium  (COLACE) 100 MG capsule, Take 100 mg by mouth daily., Disp: , Rfl:    latanoprost  (XALATAN ) 0.005 % ophthalmic solution, Place 1 drop into both eyes at bedtime., Disp: , Rfl:    midodrine  (PROAMATINE ) 5 MG tablet, Take 1 tablet (5 mg total) by mouth Every Tuesday,Thursday,and Saturday with dialysis. Please give 30 minutes prior to dialysis. Do NOT hold for dialysis. GIVE DOSE DURING DAYLIGHT HOURS ONLY, Disp: , Rfl:    polyethylene glycol powder (GLYCOLAX /MIRALAX ) 17 GM/SCOOP powder, , Disp: , Rfl:    pravastatin  (PRAVACHOL ) 20 MG tablet, Take 20 mg by mouth daily., Disp: , Rfl:    sevelamer  carbonate (RENVELA ) 800  MG tablet, Take 1,600 mg by mouth 3 (three) times daily with meals., Disp: , Rfl:    Spacer/Aero-Holding Chambers (AEROCHAMBER MV) inhaler, Use as instructed, Disp: 1 each, Rfl: 1   timolol  (TIMOPTIC ) 0.5 % ophthalmic solution, Place 1 drop into both eyes daily., Disp: , Rfl:    ZYRTEC ALLERGY 10 MG tablet, Take 10 mg by mouth daily., Disp: , Rfl:   Social History   Tobacco Use  Smoking Status Never  Smokeless Tobacco Never    Allergies  Allergen Reactions   Gadolinium Derivatives Hives    Other reaction(s): Unknown Other reaction(s): Other (See Comments) Other reaction(s): Unknown   Penicillins Hives, Itching and Swelling    Has patient had a PCN reaction causing immediate rash,  facial/tongue/throat swelling, SOB or lightheadedness with hypotension: No Has patient had a PCN reaction causing severe rash involving mucus membranes or skin necrosis: No Has patient had a PCN reaction that required hospitalization: No Has patient had a PCN reaction occurring within the last 10 years: No If all of the above answers are "NO", then may proceed with Cephalosporin use.  Penicillin allergy assessment completed on 03/22/20. Patient has received and tolerated cepholosporins incuding cefepime, ceftriaxone.    Codeine Hives   Codeine Sulfate Nausea Only   Contrast Media [Iodinated Contrast Media] Hives   Gadobutrol Hives   Metrizamide Hives   Statins Other (See Comments)    muscle pain   Objective:  There were no vitals filed for this visit. There is no height or weight on file to calculate BMI. Constitutional Well developed. Well nourished.  Vascular Dorsalis pedis pulses palpable bilaterally. Posterior tibial pulses palpable bilaterally. Capillary refill normal to all digits.  No cyanosis or clubbing noted. Pedal hair growth normal.  Neurologic Normal speech. Oriented to person, place, and time. Epicritic sensation to light touch grossly present bilaterally.  Dermatologic Nails well groomed and normal in appearance. No open wounds. No skin lesions.  Orthopedic: Normal joint ROM without pain or crepitus bilaterally. No visible deformities. Tender to palpation at the calcaneal tuber bilaterally. No pain with calcaneal squeeze bilaterally. Ankle ROM diminished range of motion bilaterally. Silfverskiold Test: positive bilaterally.   Radiographs: None  Assessment:   1. Plantar fasciitis of right foot   2. Plantar fasciitis of left foot   3. Gastrocnemius equinus of both lower extremities    Plan:  Patient was evaluated and treated and all questions answered.  Plantar Fasciitis, bilaterally with underlying gastrocnemius equinus - XR reviewed as above.  -  Educated on icing and stretching. Instructions given.  - Injection delivered to the plantar fascia as below. - DME: Plantar fascial brace dispensed to support the medial longitudinal arch of the foot and offload pressure from the heel and prevent arch collapse during weightbearing - Pharmacologic management: None  Procedure: Injection Tendon/Ligament Location: Bilateral plantar fascia at the glabrous junction; medial approach. Skin Prep: alcohol Injectate: 0.5 cc 0.5% marcaine  plain, 0.5 cc of 1% Lidocaine , 0.5 cc kenalog  10. Disposition: Patient tolerated procedure well. Injection site dressed with a band-aid.  No follow-ups on file.

## 2023-12-02 ENCOUNTER — Ambulatory Visit: Admitting: Podiatry

## 2024-01-05 DEATH — deceased
